# Patient Record
Sex: Female | Born: 1980
Health system: Southern US, Community
[De-identification: ages and names within clinical notes are randomized; demographics above are authoritative.]

## PROBLEM LIST (undated history)

## (undated) DIAGNOSIS — M1990 Unspecified osteoarthritis, unspecified site: Secondary | ICD-10-CM

## (undated) DIAGNOSIS — N201 Calculus of ureter: Secondary | ICD-10-CM

## (undated) DIAGNOSIS — G43909 Migraine, unspecified, not intractable, without status migrainosus: Secondary | ICD-10-CM

## (undated) DIAGNOSIS — G4730 Sleep apnea, unspecified: Secondary | ICD-10-CM

## (undated) DIAGNOSIS — D069 Carcinoma in situ of cervix, unspecified: Secondary | ICD-10-CM

## (undated) DIAGNOSIS — R112 Nausea with vomiting, unspecified: Secondary | ICD-10-CM

## (undated) DIAGNOSIS — F419 Anxiety disorder, unspecified: Secondary | ICD-10-CM

## (undated) DIAGNOSIS — E282 Polycystic ovarian syndrome: Secondary | ICD-10-CM

## (undated) DIAGNOSIS — J45909 Unspecified asthma, uncomplicated: Secondary | ICD-10-CM

## (undated) DIAGNOSIS — R300 Dysuria: Secondary | ICD-10-CM

## (undated) DIAGNOSIS — N200 Calculus of kidney: Secondary | ICD-10-CM

## (undated) DIAGNOSIS — R319 Hematuria, unspecified: Secondary | ICD-10-CM

## (undated) DIAGNOSIS — N3942 Incontinence without sensory awareness: Secondary | ICD-10-CM

## (undated) DIAGNOSIS — Z8489 Family history of other specified conditions: Secondary | ICD-10-CM

## (undated) DIAGNOSIS — K449 Diaphragmatic hernia without obstruction or gangrene: Secondary | ICD-10-CM

## (undated) DIAGNOSIS — K219 Gastro-esophageal reflux disease without esophagitis: Secondary | ICD-10-CM

## (undated) DIAGNOSIS — Z87442 Personal history of urinary calculi: Secondary | ICD-10-CM

## (undated) DIAGNOSIS — D649 Anemia, unspecified: Secondary | ICD-10-CM

## (undated) DIAGNOSIS — Z87410 Personal history of cervical dysplasia: Secondary | ICD-10-CM

## (undated) DIAGNOSIS — N189 Chronic kidney disease, unspecified: Secondary | ICD-10-CM

## (undated) DIAGNOSIS — T7840XA Allergy, unspecified, initial encounter: Secondary | ICD-10-CM

## (undated) DIAGNOSIS — Z9889 Other specified postprocedural states: Secondary | ICD-10-CM

## (undated) HISTORY — DX: Polycystic ovarian syndrome: E28.2

## (undated) HISTORY — PX: FINGER SURGERY: SHX640

## (undated) HISTORY — PX: CERVICAL BIOPSY  W/ LOOP ELECTRODE EXCISION: SUR135

## (undated) HISTORY — DX: Diaphragmatic hernia without obstruction or gangrene: K44.9

## (undated) HISTORY — DX: Carcinoma in situ of cervix, unspecified: D06.9

## (undated) HISTORY — PX: COMBINED HYSTEROSCOPY DIAGNOSTIC / D&C: SUR297

## (undated) HISTORY — DX: Allergy, unspecified, initial encounter: T78.40XA

## (undated) HISTORY — PX: FRACTURE SURGERY: SHX138

## (undated) HISTORY — DX: Migraine, unspecified, not intractable, without status migrainosus: G43.909

## (undated) HISTORY — DX: Chronic kidney disease, unspecified: N18.9

## (undated) HISTORY — PX: ABDOMINAL HYSTERECTOMY: SHX81

## (undated) HISTORY — PX: DILATION AND CURETTAGE OF UTERUS: SHX78

---
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
## 2001-06-14 ENCOUNTER — Other Ambulatory Visit: Admission: RE | Admit: 2001-06-14 | Discharge: 2001-06-14 | Payer: Self-pay | Admitting: Obstetrics and Gynecology

## 2002-06-26 ENCOUNTER — Other Ambulatory Visit: Admission: RE | Admit: 2002-06-26 | Discharge: 2002-06-26 | Payer: Self-pay | Admitting: Obstetrics and Gynecology

## 2003-07-15 ENCOUNTER — Other Ambulatory Visit: Admission: RE | Admit: 2003-07-15 | Discharge: 2003-07-15 | Payer: Self-pay | Admitting: Obstetrics and Gynecology

## 2004-01-26 DIAGNOSIS — C801 Malignant (primary) neoplasm, unspecified: Secondary | ICD-10-CM

## 2004-01-26 DIAGNOSIS — Z87410 Personal history of cervical dysplasia: Secondary | ICD-10-CM

## 2004-01-26 HISTORY — DX: Personal history of cervical dysplasia: Z87.410

## 2004-01-26 HISTORY — DX: Malignant (primary) neoplasm, unspecified: C80.1

## 2004-10-07 ENCOUNTER — Other Ambulatory Visit: Admission: RE | Admit: 2004-10-07 | Discharge: 2004-10-07 | Payer: Self-pay | Admitting: Obstetrics and Gynecology

## 2004-12-04 ENCOUNTER — Encounter (INDEPENDENT_AMBULATORY_CARE_PROVIDER_SITE_OTHER): Payer: Self-pay | Admitting: Specialist

## 2004-12-04 ENCOUNTER — Ambulatory Visit (HOSPITAL_COMMUNITY): Admission: RE | Admit: 2004-12-04 | Discharge: 2004-12-04 | Payer: Self-pay | Admitting: Obstetrics and Gynecology

## 2004-12-12 ENCOUNTER — Inpatient Hospital Stay (HOSPITAL_COMMUNITY): Admission: AD | Admit: 2004-12-12 | Discharge: 2004-12-12 | Payer: Self-pay | Admitting: Obstetrics & Gynecology

## 2005-03-17 ENCOUNTER — Other Ambulatory Visit: Admission: RE | Admit: 2005-03-17 | Discharge: 2005-03-17 | Payer: Self-pay | Admitting: Obstetrics and Gynecology

## 2005-08-29 ENCOUNTER — Emergency Department (HOSPITAL_COMMUNITY): Admission: EM | Admit: 2005-08-29 | Discharge: 2005-08-29 | Payer: Self-pay | Admitting: Emergency Medicine

## 2007-04-12 ENCOUNTER — Emergency Department: Payer: Self-pay | Admitting: Emergency Medicine

## 2008-01-26 HISTORY — PX: OTHER SURGICAL HISTORY: SHX169

## 2008-03-25 HISTORY — PX: COLONOSCOPY: SHX174

## 2008-09-03 ENCOUNTER — Ambulatory Visit: Payer: Self-pay | Admitting: Family Medicine

## 2008-09-17 ENCOUNTER — Ambulatory Visit: Payer: Self-pay | Admitting: Family Medicine

## 2008-11-11 ENCOUNTER — Ambulatory Visit: Payer: Self-pay | Admitting: Family Medicine

## 2009-04-11 ENCOUNTER — Emergency Department: Payer: Self-pay | Admitting: Emergency Medicine

## 2009-04-12 ENCOUNTER — Emergency Department: Payer: Self-pay | Admitting: Emergency Medicine

## 2009-11-24 ENCOUNTER — Encounter
Admission: RE | Admit: 2009-11-24 | Discharge: 2009-11-24 | Payer: Self-pay | Source: Home / Self Care | Attending: Obstetrics and Gynecology | Admitting: Obstetrics and Gynecology

## 2010-02-16 ENCOUNTER — Encounter
Admission: RE | Admit: 2010-02-16 | Discharge: 2010-02-24 | Payer: Self-pay | Source: Home / Self Care | Attending: Obstetrics and Gynecology | Admitting: Obstetrics and Gynecology

## 2010-05-21 ENCOUNTER — Ambulatory Visit (INDEPENDENT_AMBULATORY_CARE_PROVIDER_SITE_OTHER): Payer: 59 | Admitting: Family Medicine

## 2010-05-21 DIAGNOSIS — J069 Acute upper respiratory infection, unspecified: Secondary | ICD-10-CM

## 2010-05-21 DIAGNOSIS — J309 Allergic rhinitis, unspecified: Secondary | ICD-10-CM

## 2010-06-01 ENCOUNTER — Ambulatory Visit (INDEPENDENT_AMBULATORY_CARE_PROVIDER_SITE_OTHER): Payer: 59 | Admitting: Family Medicine

## 2010-06-01 DIAGNOSIS — R05 Cough: Secondary | ICD-10-CM

## 2010-06-01 DIAGNOSIS — R059 Cough, unspecified: Secondary | ICD-10-CM

## 2010-06-05 ENCOUNTER — Telehealth: Payer: Self-pay | Admitting: Family Medicine

## 2010-06-05 DIAGNOSIS — IMO0001 Reserved for inherently not codable concepts without codable children: Secondary | ICD-10-CM

## 2010-06-05 NOTE — Telephone Encounter (Signed)
PT IRRITATED THAT SHE HAS BEEN SICK FOR OVER A MONTH AND MEDS NOT HELPING. JUST FINISHED [facility]PAK TODAY AND STILL COUGHING A LOT. CHEST STILL FEELS "LIKE IT IS FULL OF FLUID" , "FEELS LIKE JELLO WHEN SHE BREATHE". KNAPP MENTIONED MAYBE AN XRAY IF THIS MED DOES NOT HELP. IS THERE SOMETHING SHE CAN DO TO HELP SYMPTOMS FOR NOW? WHAT DO YO SUGGEST WE DO NOW?    --CAN BE REACHED AT WORK # [phone] UNTIL 5:00 THEN AT [phone] AFTER 5:00*--

## 2010-06-12 NOTE — Op Note (Signed)
[name], [name]                 ACCOUNT NO.:  [id number]   MEDICAL RECORD NO.:  [id number]          PATIENT TYPE:  AMB   LOCATION:  SDC                           FACILITY:  [facility]   PHYSICIAN:  Michelle L. Grewal, M.D.DATE OF BIRTH:  Oct 16, 1980   DATE OF PROCEDURE:  12/04/2004  DATE OF DISCHARGE:                                 OPERATIVE REPORT   PREOPERATIVE DIAGNOSIS:  Cervical intraepithelial neoplasia I and II.   POSTOPERATIVE DIAGNOSIS:  Cervical intraepithelial neoplasia I and II.   PROCEDURE:  Loop electrosurgical excision procedure cone biopsy of the  cervix.   SURGEON:  Michelle L. Vincente Poli, M.D.   ANESTHESIA:  MAC with local.   ESTIMATED BLOOD LOSS:  Minimal.   COMPLICATIONS:  None.   PROCEDURE:  The patient was taken to the operating room.  She was given  sedation and placed in the lithotomy position.  An involuted speculum was  placed in the vagina.  The cervix was grasped with a tenaculum and a  paracervical block was then performed.  Lugol's solution was applied and a  LEEP cone biopsy was performed in standard fashion an exocervical specimen  and an endocervical specimen,  Rollerball is applied for hemostasis.  I then  applied some Gelfoam and Monsel's solution for further hemostasis.  Hemostasis was excellent.  All instruments were removed from the vagina.  All sponge, lap and instrument counts were correct x2.  The patient went to  the recovery room in stable condition.      Michelle L. Vincente Poli, M.D.  Electronically Signed     MLG/MEDQ  D:  12/04/2004  T:  12/05/2004  Job:  [id number]

## 2011-03-17 ENCOUNTER — Ambulatory Visit (INDEPENDENT_AMBULATORY_CARE_PROVIDER_SITE_OTHER): Payer: 59 | Admitting: Family Medicine

## 2011-03-17 ENCOUNTER — Encounter: Payer: Self-pay | Admitting: Family Medicine

## 2011-03-17 VITALS — BP 120/70 | HR 94 | Ht 62.0 in | Wt 180.0 lb

## 2011-03-17 DIAGNOSIS — F988 Other specified behavioral and emotional disorders with onset usually occurring in childhood and adolescence: Secondary | ICD-10-CM

## 2011-03-17 MED ORDER — METHYLPHENIDATE HCL 10 MG PO TABS: 10.0000 mg | ORAL_TABLET | Freq: Two times a day (BID) | ORAL | Status: DC

## 2011-03-17 NOTE — Patient Instructions (Signed)
Let me know if it works, how long does work and do you have any difficulty with this.

## 2011-03-17 NOTE — Progress Notes (Signed)
  Subjective:    Patient ID: Kayla Sexton, female    DOB: [birth year], 31 y.o.   MRN: [id number]  HPI He is here for consultation concerning ADD. She was apparently diagnosed with this as a child and used Ritalin while in school. She is now employed and is having difficulty with staying on task, being easily distracted, cannot focus. This is interfering with her ability to work. She is apparently getting in trouble with her employer. She would like to get placed back on her Ritalin.   Review of Systems     Objective:   Physical Exam Alert and in no distress. ADD testing was 47      Assessment & Plan:  ADD. I will give her Ritalin. I discussed possible side effects with her, duration of action and efficacy. She will let me know how this works.

## 2011-03-19 ENCOUNTER — Telehealth: Payer: Self-pay | Admitting: Family Medicine

## 2011-03-19 NOTE — Telephone Encounter (Signed)
Called patient and left her a message informing her that Dr.Lalonde is recommending that she take 2 per day, there is a longer acting alternative but it will cost more. Instructed patient to call back if any further questions.

## 2011-03-19 NOTE — Telephone Encounter (Signed)
Have her take two a day. Let her know that we can give a longer acting med but it will cost more

## 2011-03-19 NOTE — Telephone Encounter (Signed)
Pt called and states Ritalin is working well other than it is only lasting 4 hours.  Should she take another after the 4 hours?  Please advise [phone]

## 2011-03-24 ENCOUNTER — Telehealth: Payer: Self-pay | Admitting: Family Medicine

## 2011-03-24 DIAGNOSIS — F988 Other specified behavioral and emotional disorders with onset usually occurring in childhood and adolescence: Secondary | ICD-10-CM

## 2011-03-24 MED ORDER — METHYLPHENIDATE HCL 10 MG PO TABS: 10.0000 mg | ORAL_TABLET | Freq: Two times a day (BID) | ORAL | Status: DC

## 2011-03-24 NOTE — Telephone Encounter (Signed)
Ritalin 10 mg, 60 pills written.

## 2011-03-24 NOTE — Telephone Encounter (Signed)
Called patient and she will be here 03/26/11 @ 3:30pm per [employer]

## 2011-03-26 ENCOUNTER — Institutional Professional Consult (permissible substitution): Payer: 59 | Admitting: Family Medicine

## 2011-03-29 ENCOUNTER — Ambulatory Visit (INDEPENDENT_AMBULATORY_CARE_PROVIDER_SITE_OTHER): Payer: 59 | Admitting: Family Medicine

## 2011-03-29 DIAGNOSIS — F802 Mixed receptive-expressive language disorder: Secondary | ICD-10-CM

## 2011-03-29 DIAGNOSIS — H9325 Central auditory processing disorder: Secondary | ICD-10-CM

## 2011-03-29 DIAGNOSIS — F988 Other specified behavioral and emotional disorders with onset usually occurring in childhood and adolescence: Secondary | ICD-10-CM

## 2011-03-29 NOTE — Progress Notes (Signed)
  Subjective:    Patient ID: Kayla Sexton, female    DOB: [birth year], 31 y.o.   MRN: [id number]  HPI She is here for consultation. She states that the Ritalin is working to help with focus and concentration and last approximately 4 hours. This is causing some difficulty during the middle of the day. She also has a previous history of a central auditory processing disorder which is causing some difficulty with her supervisor in regard to giving verbal orders and her not acting upon them.   Review of Systems     Objective:   Physical Exam Alert and in no distress otherwise not examined       Assessment & Plan:  ADD. Central auditory processing disorder. After discussion with her, I will place her on 27 mg of Concerta to help get her through the whole day. I also filled out paperwork explaining the fact that at giving written instructions written and verbal would be much better for wait for her to get her job done.

## 2011-09-01 ENCOUNTER — Telehealth: Payer: Self-pay | Admitting: Internal Medicine

## 2011-09-02 MED ORDER — METHYLPHENIDATE HCL ER (OSM) 27 MG PO TBCR: 27.0000 mg | EXTENDED_RELEASE_TABLET | ORAL | Status: DC

## 2011-09-02 NOTE — Telephone Encounter (Signed)
Concerta 27 mg renewed

## 2011-09-02 NOTE — Telephone Encounter (Signed)
Pt is taking concerta 20mg . Not ritalin. Pt would like Othal Kubitz(me) to take it to her when its ready

## 2011-09-24 ENCOUNTER — Ambulatory Visit: Payer: 59 | Admitting: Medical

## 2011-09-25 ENCOUNTER — Emergency Department (HOSPITAL_COMMUNITY)
Admission: EM | Admit: 2011-09-25 | Discharge: 2011-09-26 | Disposition: A | Discharge status: Home / Self Care | Payer: 59 | Attending: Emergency Medicine | Admitting: Emergency Medicine

## 2011-09-25 ENCOUNTER — Encounter (HOSPITAL_COMMUNITY): Payer: Self-pay | Admitting: Emergency Medicine

## 2011-09-25 DIAGNOSIS — E119 Type 2 diabetes mellitus without complications: Secondary | ICD-10-CM | POA: Insufficient documentation

## 2011-09-25 DIAGNOSIS — L03211 Cellulitis of face: Secondary | ICD-10-CM | POA: Insufficient documentation

## 2011-09-25 DIAGNOSIS — L0201 Cutaneous abscess of face: Secondary | ICD-10-CM | POA: Insufficient documentation

## 2011-09-25 DIAGNOSIS — L0291 Cutaneous abscess, unspecified: Secondary | ICD-10-CM

## 2011-09-25 NOTE — ED Notes (Signed)
Pt alert, c.o infection to forehead, onset was several days ago, resp even unlabored, skin pwd, 2cm raised redden area, non open non draining

## 2011-09-26 ENCOUNTER — Emergency Department (HOSPITAL_COMMUNITY): Payer: 59

## 2011-09-26 ENCOUNTER — Emergency Department (HOSPITAL_COMMUNITY)
Admission: EM | Admit: 2011-09-26 | Discharge: 2011-09-26 | Disposition: A | Discharge status: Home / Self Care | Payer: 59 | Attending: Emergency Medicine | Admitting: Emergency Medicine

## 2011-09-26 ENCOUNTER — Encounter (HOSPITAL_COMMUNITY): Payer: Self-pay | Admitting: *Deleted

## 2011-09-26 DIAGNOSIS — R51 Headache: Secondary | ICD-10-CM | POA: Insufficient documentation

## 2011-09-26 DIAGNOSIS — L0390 Cellulitis, unspecified: Secondary | ICD-10-CM

## 2011-09-26 DIAGNOSIS — L0291 Cutaneous abscess, unspecified: Secondary | ICD-10-CM | POA: Insufficient documentation

## 2011-09-26 DIAGNOSIS — H5789 Other specified disorders of eye and adnexa: Secondary | ICD-10-CM | POA: Insufficient documentation

## 2011-09-26 DIAGNOSIS — H532 Diplopia: Secondary | ICD-10-CM | POA: Insufficient documentation

## 2011-09-26 LAB — POCT I-STAT, CHEM 8
BUN: 12 mg/dL (ref 6–23)
Chloride: 104 mEq/L (ref 96–112)
Creatinine, Ser: 0.8 mg/dL (ref 0.50–1.10)
Glucose, Bld: 86 mg/dL (ref 70–99)
HCT: 42 % (ref 36.0–46.0)
Potassium: 3.6 mEq/L (ref 3.5–5.1)

## 2011-09-26 LAB — CBC WITH DIFFERENTIAL/PLATELET
Hemoglobin: 14.2 g/dL (ref 12.0–15.0)
Lymphocytes Relative: 41 % (ref 12–46)
Lymphs Abs: 3.9 10*3/uL (ref 0.7–4.0)
Monocytes Relative: 10 % (ref 3–12)
Neutro Abs: 4.4 10*3/uL (ref 1.7–7.7)
Neutrophils Relative %: 47 % (ref 43–77)
RBC: 4.89 MIL/uL (ref 3.87–5.11)

## 2011-09-26 LAB — GLUCOSE, CAPILLARY

## 2011-09-26 MED ORDER — VANCOMYCIN HCL IN DEXTROSE 1-5 GM/200ML-% IV SOLN
1000.0000 mg | Freq: Once | INTRAVENOUS | Status: AC
  Administered 2011-09-26: 1000 mg via INTRAVENOUS
  Filled 2011-09-26: qty 200

## 2011-09-26 MED ORDER — SULFAMETHOXAZOLE-TRIMETHOPRIM 800-160 MG PO TABS: 2.0000 | ORAL_TABLET | Freq: Two times a day (BID) | ORAL | Status: AC

## 2011-09-26 MED ORDER — ONDANSETRON HCL 4 MG/2ML IJ SOLN
4.0000 mg | Freq: Once | INTRAMUSCULAR | Status: AC
  Administered 2011-09-26: 4 mg via INTRAVENOUS
  Filled 2011-09-26: qty 2

## 2011-09-26 MED ORDER — HYDROCODONE-ACETAMINOPHEN 5-325 MG PO TABS: 1.0000 | ORAL_TABLET | Freq: Four times a day (QID) | ORAL | Status: AC | PRN

## 2011-09-26 MED ORDER — IOHEXOL 300 MG/ML  SOLN
100.0000 mL | Freq: Once | INTRAMUSCULAR | Status: AC | PRN
  Administered 2011-09-26: 100 mL via INTRAVENOUS

## 2011-09-26 MED ORDER — MORPHINE SULFATE 4 MG/ML IJ SOLN
4.0000 mg | Freq: Once | INTRAMUSCULAR | Status: AC
  Administered 2011-09-26: 4 mg via INTRAVENOUS
  Filled 2011-09-26: qty 1

## 2011-09-26 MED ORDER — SULFAMETHOXAZOLE-TMP DS 800-160 MG PO TABS
1.0000 | ORAL_TABLET | Freq: Once | ORAL | Status: AC
  Administered 2011-09-26: 1 via ORAL
  Filled 2011-09-26: qty 1

## 2011-09-26 MED ORDER — SULFAMETHOXAZOLE-TRIMETHOPRIM 800-160 MG PO TABS: 2.0000 | ORAL_TABLET | Freq: Two times a day (BID) | ORAL | Status: DC

## 2011-09-26 MED ORDER — LORAZEPAM 1 MG PO TABS
1.0000 mg | ORAL_TABLET | Freq: Once | ORAL | Status: AC
  Administered 2011-09-26: 1 mg via ORAL
  Filled 2011-09-26: qty 1

## 2011-09-26 MED ORDER — DIPHENHYDRAMINE HCL 50 MG/ML IJ SOLN
25.0000 mg | Freq: Once | INTRAMUSCULAR | Status: AC
  Administered 2011-09-26: 25 mg via INTRAVENOUS
  Filled 2011-09-26: qty 1

## 2011-09-26 MED ORDER — HYDROCODONE-ACETAMINOPHEN 5-325 MG PO TABS
1.0000 | ORAL_TABLET | Freq: Once | ORAL | Status: AC
  Administered 2011-09-26: 1 via ORAL
  Filled 2011-09-26: qty 1

## 2011-09-26 NOTE — ED Notes (Signed)
Unable to complete visual acuity at this time due to RN and [facility] at bedside

## 2011-09-26 NOTE — ED Notes (Signed)
Patient complaining of severe itching approximately  40 minutes after  vancomycin infusion and headache is worse.  Patient denies any known allergies to vancomycin. Will inform MD.

## 2011-09-26 NOTE — ED Provider Notes (Signed)
History     CSN: [id number]  Arrival date & time 09/26/11  1226   First MD Initiated Contact with Patient 09/26/11 1428      Chief Complaint  Patient presents with  . Headache  . Eye Pain    (Consider location/radiation/quality/duration/timing/severity/associated sxs/prior treatment) HPI Comments: Patient was seen here for a forehead abscess last night.  An I and D was performed and the patient was discharged.  She was prescribed antibiotics, however she has not started them as it was 3 AM when she was discharged.  She returns today complaining of more swelling and "double vision".  She reports her head hurts.  Patient is a 31 y.o. female presenting with headaches and eye pain. The history is provided by the patient.  Headache  This is a new problem. The current episode started 2 days ago. The problem occurs constantly. The problem has been gradually worsening. The headache is associated with nothing. The quality of the pain is described as throbbing. The pain is moderate.  Eye Pain Associated symptoms include headaches.    Past Medical History  Diagnosis Date  . CIN III (cervical intraepithelial neoplasia III)   . HH (hiatus hernia)   . Migraines   . DM (diabetes mellitus) 10/2009    Past Surgical History  Procedure Date  . Colonoscopy 03/2008    Dr. Bosie Clos  . Endoscopy 2010    Dr. Madilyn Fireman    No family history on file.  History  Substance Use Topics  . Smoking status: Never Smoker   . Smokeless tobacco: Never Used  . Alcohol Use: No    OB History    Grav Para Term Preterm Abortions TAB SAB Ect Mult Living                  Review of Systems  Eyes: Positive for pain.  Neurological: Positive for headaches.  All other systems reviewed and are negative.    Allergies  Adhesive  Home Medications   Current Outpatient Rx  Name Route Sig Dispense Refill  . HYDROCODONE-ACETAMINOPHEN 5-325 MG PO TABS Oral Take 1 tablet by mouth every 6 (six) hours as needed  for pain. 15 tablet 0  . METHYLPHENIDATE HCL ER 27 MG PO TBCR Oral Take 1 tablet (27 mg total) by mouth every morning. 30 tablet 0  . SULFAMETHOXAZOLE-TRIMETHOPRIM 800-160 MG PO TABS Oral Take 2 tablets by mouth 2 (two) times daily. 24 tablet 0    BP 113/83  Pulse 86  Temp 98.7 F (37.1 C) (Oral)  Resp 17  SpO2 96%  LMP 07/29/2011  Physical Exam  Nursing note and vitals reviewed. Constitutional: She is oriented to person, place, and time. She appears well-developed and well-nourished.  HENT:       There is an area of swelling, redness, ttp to the right forehead.  I am able to express a small amount of pus.  The redness has not exceeded the line drawn last night.  Eyes: EOM are normal. Pupils are equal, round, and reactive to light.       The patient reports double vision that does not correct with covering her left eye.    Neck: Normal range of motion.  Neurological: She is alert and oriented to person, place, and time. No cranial nerve deficit. Coordination normal.  Skin: Skin is warm and dry.    ED Course  Procedures (including critical care time)  Labs Reviewed - No data to display No results found.   No diagnosis  found.    MDM  The patient presents here for follow up of forehead abscess.  She returns to be evaluated as the swelling is extending into her lower forehead and eye.  She is also having some double vision as well which is monocular and does not correct when covering the left eye.  I am uncertain of the etiology of the double vision, however a maxillofacial ct will be performed to rule out the possibility of extension into the orbit.  She was also given a dose of vancomycin which caused a rash and was given benadryl.    At this point, the ct is pending and the care will be signed out to Dr. Freida Busman who will obtain these results and determine the final disposition.  Radiology is awaiting an istat before the ct scan due to the patient's history of "pre-diabetes".               Geoffery Lyons, MD 09/26/11 [phone]

## 2011-09-26 NOTE — ED Provider Notes (Signed)
History     CSN: [id number]  Arrival date & time 09/25/11  1943   First MD Initiated Contact with Patient 09/25/11 2357      Chief Complaint  Patient presents with  . Wound Check    (Consider location/radiation/quality/duration/timing/severity/associated sxs/prior treatment) Patient is a 31 y.o. female presenting with abscess. The history is provided by the patient.  Abscess  This is a new problem. The current episode started yesterday. The problem has been gradually worsening. The abscess is present on the head. The problem is mild. The abscess is characterized by redness and painfulness. Pertinent negatives include no anorexia, no decrease in physical activity, not sleeping less, not drinking less, no fever, no fussiness, not sleeping more, no diarrhea, no vomiting, no congestion, no rhinorrhea, no sore throat, no decreased responsiveness and no cough. There were no sick contacts.    Past Medical History  Diagnosis Date  . CIN III (cervical intraepithelial neoplasia III)   . HH (hiatus hernia)   . Migraines   . DM (diabetes mellitus) 10/2009    Past Surgical History  Procedure Date  . Colonoscopy 03/2008    Dr. Bosie Clos  . Endoscopy 2010    Dr. Madilyn Fireman    No family history on file.  History  Substance Use Topics  . Smoking status: Never Smoker   . Smokeless tobacco: Never Used  . Alcohol Use: Not on file    OB History    Grav Para Term Preterm Abortions TAB SAB Ect Mult Living                  Review of Systems  Constitutional: Negative for fever, chills, diaphoresis, activity change and decreased responsiveness.       Denies night sweats  HENT: Negative for congestion, sore throat, rhinorrhea and neck stiffness.   Eyes: Negative for visual disturbance.  Respiratory: Negative for cough and shortness of breath.   Cardiovascular: Negative for chest pain.  Gastrointestinal: Negative for vomiting, abdominal pain, diarrhea and anorexia.  Genitourinary: Negative  for dysuria, urgency and frequency.  Musculoskeletal: Negative for gait problem.  Skin: Negative for color change and rash.  Neurological: Positive for headaches. Negative for dizziness and light-headedness.  Hematological: Negative for adenopathy.  All other systems reviewed and are negative.    Allergies  Adhesive  Home Medications   Current Outpatient Rx  Name Route Sig Dispense Refill  . METHYLPHENIDATE HCL ER 27 MG PO TBCR Oral Take 1 tablet (27 mg total) by mouth every morning. 30 tablet 0    BP 139/90  Pulse 90  Temp 98.5 F (36.9 C) (Oral)  Resp 16  SpO2 99%  Physical Exam  Nursing note and vitals reviewed. Constitutional: She is oriented to person, place, and time. She appears well-developed and well-nourished. She does not have a sickly appearance. She does not appear ill. No distress.  HENT:  Head: Normocephalic.         See skin exam.  Non tender superior orbits and nasal bridge  Eyes: Conjunctivae and EOM are normal.       painfree EOMs   Neck: Normal range of motion. Neck supple.  Cardiovascular: Normal rate and regular rhythm.   Pulmonary/Chest: Effort normal and breath sounds normal.  Musculoskeletal: Normal range of motion. She exhibits no edema.  Lymphadenopathy:       Head (right side): No submental, no preauricular and no posterior auricular adenopathy present.       Head (left side): No submental, no submandibular, no  preauricular and no posterior auricular adenopathy present.    She has no axillary adenopathy.  Neurological: She is alert and oriented to person, place, and time.  Skin: Skin is warm and dry. No rash noted. She is not diaphoretic.       1.5 cm sized abscess located on forehead just superior to right eyebrow. Extreme tenderness to palpation. Not currently draining. Abscess is fluctuant without warmth, surrounding erythema, or induration.  Psychiatric: She has a normal mood and affect. Her behavior is normal.    ED Course    Procedures (including critical care time)  Labs Reviewed - No data to display No results found. INCISION AND DRAINAGE Performed by: Jaci Carrel Consent: Verbal consent obtained. Risks and benefits: risks, benefits and alternatives were discussed Type: abscess  Body area: forehead  Anesthesia: local infiltration  Local anesthetic: lidocaine 2% w epinephrine  Anesthetic total: 1 ml  Complexity: complex Blunt dissection to break up loculations  Drainage: purulent  Drainage amount: minimal to moderate  Packing material: none  Patient tolerance: Patient tolerated the procedure well with no immediate complications.     No diagnosis found.    MDM  Abscess  Pt to ER with abscess superior to left eyebrow w associated HA. I&D performed without complications, size not amenable for packing. Pt afebrile without any vision changes, pain with EOMs, vomiting, proptosis, or ttp of orbits. Strict return precautions discussed w pt and family if any of these s/s develop to return to ER for CT of max/facial for r/o of infection spread. Pt verbalizes understanding and appears reliable for follow up/return if necessary. Pt states she is a borderline diabetic. Will dc with antibiotics.         Jaci Carrel, [place] 09/26/11 [phone]

## 2011-09-26 NOTE — ED Provider Notes (Signed)
Patient Kayla Sexton by Dr. Judd Lien. CT results reviewed with patient and she is stable for discharge.  Toy Baker, MD 09/26/11 [phone]

## 2011-09-26 NOTE — ED Provider Notes (Signed)
Medical screening examination/treatment/procedure(s) were performed by non-physician practitioner and as supervising physician I was immediately available for consultation/collaboration.  Gerhard Munch, MD 09/26/11 [phone]

## 2011-09-26 NOTE — ED Notes (Signed)
Pt has an I&D of an abscess on her right forehead yesterday.  Pt now presents with increased swelling to area and right upper eye lid.  Pt states her headache is now worse since yesterday.  Pt has area of redness where abscess was drained and swelling of right upper eye lid and bridge of her nose.  Pt denies N/V.  Pt c/o dizziness and double vision in right eye.

## 2011-09-30 ENCOUNTER — Ambulatory Visit (INDEPENDENT_AMBULATORY_CARE_PROVIDER_SITE_OTHER): Payer: 59 | Admitting: Family Medicine

## 2011-09-30 ENCOUNTER — Encounter: Payer: Self-pay | Admitting: Family Medicine

## 2011-09-30 VITALS — BP 120/74 | HR 68 | Wt 183.0 lb

## 2011-09-30 DIAGNOSIS — L0390 Cellulitis, unspecified: Secondary | ICD-10-CM

## 2011-09-30 DIAGNOSIS — L0291 Cutaneous abscess, unspecified: Secondary | ICD-10-CM

## 2011-09-30 NOTE — Progress Notes (Signed)
  Subjective:    Patient ID: Kayla Sexton, female    DOB: Feb 21, 1980, 31 y.o.   MRN: [id number]  HPI She is here for a followup visit concerning 2 trips to the emergency room for evaluation and treatment of a 4 had abscess. She was treated appropriately in the emergency room and sent home on Septra. Apparently a culture was not taken. She states that the wound is slowly healing.   Review of Systems     Objective:   Physical Exam Alert and in no distress. She does have a healing lesion that is slightly erythematous and approximately 2 cm in size just to the right of midline on her for head.       Assessment & Plan:   1. Abscess    she will finish the course of the antibiotic. I discussed the possibility of this being MRSA and did recommend using Dial soap or Aleve or 2000. Also discussed the possibility of this occurring in other areas on her body and to call me if it does.

## 2011-09-30 NOTE — Patient Instructions (Signed)
Be vigilant in terms of this occurring elsewhere on your body and I would recommend either Dial soap or Lever 2000

## 2012-01-31 ENCOUNTER — Ambulatory Visit (INDEPENDENT_AMBULATORY_CARE_PROVIDER_SITE_OTHER): Payer: 59 | Admitting: Family Medicine

## 2012-01-31 ENCOUNTER — Encounter: Payer: Self-pay | Admitting: Family Medicine

## 2012-01-31 VITALS — BP 120/80 | HR 70 | Temp 98.3°F | Wt 186.0 lb

## 2012-01-31 DIAGNOSIS — H6693 Otitis media, unspecified, bilateral: Secondary | ICD-10-CM

## 2012-01-31 DIAGNOSIS — H6690 Otitis media, unspecified, unspecified ear: Secondary | ICD-10-CM

## 2012-01-31 MED ORDER — AMOXICILLIN 875 MG PO TABS: 875.0000 mg | ORAL_TABLET | Freq: Two times a day (BID) | ORAL | Status: DC

## 2012-01-31 NOTE — Progress Notes (Signed)
  Subjective:    Patient ID: Kayla Sexton, female    DOB: 06/07/1980, 32 y.o.   MRN: [id number]  HPI  She woke up Christmas morning with nasal congestion,  dry cough; no sore throat, earache, fever or chills. Several days later she noted more PND as well as right ear ache. She does not smoke and has no allergies. She also notes increased difficulty with postnasal drainage especially after eating.   Review of Systems     Objective:   Physical Exam alert and in no distress. Tympanic membranes bilaterally are dull and slightly red, canals are normal. Throat is clear. Tonsils are normal. Neck is supple without adenopathy or thyromegaly. Cardiac exam shows a regular sinus rhythm without murmurs or gallops. Lungs are clear to auscultation.        Assessment & Plan:   1. 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 Recommend possibly trying antihistamine for this.

## 2012-01-31 NOTE — Patient Instructions (Addendum)
You probably have gustatory rhinitis. Take all the antibiotic and if you're still having difficulty then try antihistamines can

## 2012-02-07 ENCOUNTER — Telehealth: Payer: Self-pay | Admitting: Internal Medicine

## 2012-02-07 MED ORDER — AMOXICILLIN-POT CLAVULANATE 875-125 MG PO TABS: 1.0000 | ORAL_TABLET | Freq: Two times a day (BID) | ORAL | Status: AC

## 2012-02-07 NOTE — Telephone Encounter (Signed)
Let her know that I called in Augmentin which is stronger than just plain Amoxil.

## 2012-02-07 NOTE — Telephone Encounter (Signed)
Augmentin called in. She is actually getting worse.

## 2012-02-07 NOTE — Telephone Encounter (Signed)
Pt called stating that the amoxicillin that she was given last week has not helped any. Pt is worst than before and now is weak and having SOB and can not walk far without getting out of breath. Cough is still there and still having pain in both ear and still can not hear out of her ear and she would like something else. Pt pharmacy is [facility]

## 2012-02-07 NOTE — Telephone Encounter (Signed)
Left message med was called in

## 2012-02-28 ENCOUNTER — Other Ambulatory Visit: Payer: Self-pay | Admitting: Obstetrics and Gynecology

## 2012-10-26 ENCOUNTER — Ambulatory Visit (INDEPENDENT_AMBULATORY_CARE_PROVIDER_SITE_OTHER): Payer: 59 | Admitting: Family Medicine

## 2012-10-26 ENCOUNTER — Encounter: Payer: Self-pay | Admitting: Family Medicine

## 2012-10-26 DIAGNOSIS — Z23 Encounter for immunization: Secondary | ICD-10-CM

## 2012-10-26 DIAGNOSIS — L989 Disorder of the skin and subcutaneous tissue, unspecified: Secondary | ICD-10-CM

## 2012-10-26 NOTE — Progress Notes (Signed)
  Subjective:    Patient ID: Kayla Sexton, female    DOB: Dec 16, 1980, 32 y.o.   MRN: [id number]  HPI She has a three-day history of left breast lesions. She has had 3 episodes of this within the last year. The lesions appear and only become slightly painful right before they burst.  Neer usually mainly on the left breast. She has no lesions anywhere else on her body.   Review of Systems     Objective:   Physical Exam 2 lesions are present on the left breast that are superficial and slightly erythematous. There is one on her mid chest area that almost looks like a cigarette burn. It is slightly erythematous around the edges with central yellowish crusting       Assessment & Plan:  Skin lesions, generalized  Need for prophylactic vaccination and inoculation against influenza - Plan: Flu Vaccine QUAD 36+ mos IM  flu shot given with risks and benefits discussed. It is bothersome that the lesions always occur on the left breast area however they do not look herpetic. Encouraged her to come in right prior to the lesions bursting so I can get a culture.

## 2012-10-26 NOTE — Patient Instructions (Signed)
Put some padding on the area and you can use some Polysporin. If you develop these lesions again, come right before they break so I can get a culture

## 2012-10-27 ENCOUNTER — Ambulatory Visit (INDEPENDENT_AMBULATORY_CARE_PROVIDER_SITE_OTHER): Payer: 59 | Admitting: Family Medicine

## 2012-10-27 ENCOUNTER — Telehealth: Payer: Self-pay | Admitting: Family Medicine

## 2012-10-27 DIAGNOSIS — L0390 Cellulitis, unspecified: Secondary | ICD-10-CM

## 2012-10-27 DIAGNOSIS — L0291 Cutaneous abscess, unspecified: Secondary | ICD-10-CM

## 2012-10-27 MED ORDER — DOXYCYCLINE HYCLATE 100 MG PO TABS: 100.0000 mg | ORAL_TABLET | Freq: Two times a day (BID) | ORAL | Status: DC

## 2012-10-27 NOTE — Patient Instructions (Signed)
Use Hibiclens daily for the next several days as well as the antibiotic.

## 2012-10-27 NOTE — Progress Notes (Signed)
  Subjective:    Patient ID: Kayla Sexton, female    DOB: 1980-05-13, 32 y.o.   MRN: [id number]  HPI She is here for reevaluation. She noted earlier today's slight burning sensation in the left breast area and did note some slight drainage from some new lesions.   Review of Systems     Objective:   Physical Exam Exam of the left breast does show several new slightly erythematous lesions with a minimal amount of drainage.        Assessment & Plan:  Cellulitis - Plan: doxycycline (VIBRA-TABS) 100 MG tablet, Culture, routine-abscess  it does seem to be very superficial but there was a slight amount of drainage. Recommend she use Hibiclens and no place her on doxycycline.

## 2012-10-27 NOTE — Telephone Encounter (Signed)
Pt called to update the status of her visit from yesterday's apt on 10/26/12.  She states her bumps are getting worse, and starting to spread and noticed a few more, and on one of them there is a white center but is not able to pop it, and she is at an uncomfortable state, and would like to know if there is something that can be called into [facility] in [place] off [address] to control it.  She does not want to come back in b/c she cannot afford to take more time off from work.  She was very upset, and felt like nothing was done for her yesterday, and does not know what to do. Please call pt at [phone]

## 2012-10-30 ENCOUNTER — Telehealth: Payer: Self-pay | Admitting: Family Medicine

## 2012-10-30 NOTE — Telephone Encounter (Signed)
Pt called again upset.  She said she is not responding to the medication that was given to her for her bumps, and it not improving at all.  She's still continuing to break out and get worse, and is insisting to know what she should or can do. She's requesting a call back at [phone].

## 2012-10-31 LAB — CULTURE, ROUTINE-ABSCESS
Gram Stain: NONE SEEN
Organism ID, Bacteria: NO GROWTH

## 2012-11-06 NOTE — Telephone Encounter (Signed)
Did you call on this or do I need to?

## 2013-01-25 HISTORY — PX: FOOT SURGERY: SHX648

## 2013-04-12 ENCOUNTER — Ambulatory Visit (INDEPENDENT_AMBULATORY_CARE_PROVIDER_SITE_OTHER): Payer: 59 | Admitting: Family Medicine

## 2013-04-12 ENCOUNTER — Encounter: Payer: Self-pay | Admitting: Family Medicine

## 2013-04-12 VITALS — BP 100/70 | HR 68 | Temp 97.6°F | Wt 188.0 lb

## 2013-04-12 DIAGNOSIS — J069 Acute upper respiratory infection, unspecified: Secondary | ICD-10-CM

## 2013-04-12 MED ORDER — ALBUTEROL SULFATE HFA 108 (90 BASE) MCG/ACT IN AERS: 2.0000 | INHALATION_SPRAY | Freq: Four times a day (QID) | RESPIRATORY_TRACT | Status: DC | PRN

## 2013-04-12 NOTE — Patient Instructions (Signed)
Use the Proventil as needed. If you've get worse over the weekend or make no improvement, give me a call Monday

## 2013-04-12 NOTE — Progress Notes (Signed)
   Subjective:    Patient ID: Kayla Sexton, female    DOB: 04/20/1980, 33 y.o.   MRN: [id number]  HPI She has a four day history of sinus congestion, cough, PND,wheezing. No sore throat, fever, chills or earache. Today she has more fatigue and malaise. She apparently did have difficulty with asthma as a child and has used albuterol in the past.   Review of Systems     Objective:   Physical Exam alert and in no distress. Tympanic membranes and canals are normal. Throat is clear. Tonsils are normal. Neck is supple without adenopathy or thyromegaly. Cardiac exam shows a regular sinus rhythm without murmurs or gallops. Lungs show scattered rhonchi.        Assessment & Plan:  Acute URI - Plan: albuterol (PROVENTIL HFA) 108 (90 BASE) MCG/ACT inhaler  recommend continued supportive care. If her symptoms worsen over the weekend or don't improve by the first week, she is supposed to call me.

## 2013-04-17 ENCOUNTER — Telehealth: Payer: Self-pay | Admitting: Internal Medicine

## 2013-04-17 MED ORDER — AMOXICILLIN 875 MG PO TABS: 875.0000 mg | ORAL_TABLET | Freq: Two times a day (BID) | ORAL | Status: DC

## 2013-04-17 NOTE — Telephone Encounter (Signed)
Pt.notified

## 2013-04-17 NOTE — Telephone Encounter (Signed)
Pt is still not feeling well, pt feels like weights on chest, chest still rattling, inhaler is not working and having SOB and can't seem to catch her breath. She is still coughing and tired.

## 2013-04-17 NOTE — Telephone Encounter (Signed)
Let her know that I called an antibiotic in 

## 2013-08-14 ENCOUNTER — Telehealth: Payer: Self-pay | Admitting: Family Medicine

## 2013-08-14 NOTE — Telephone Encounter (Signed)
Pt called and thinks she has Gluten Intollerance.  Dr. Tressia Danas has diagnosed her with PCOS and she states gluten intollerance goes hand in hand.  She has been testing herself on her foods and when she eats pasta and breads she breaks out in a rash.  So she states she really needs the test done and Dr. Earlean Shawl is who she wants to see, but needs referral.

## 2013-08-16 NOTE — Telephone Encounter (Signed)
Have her come in here and get some preliminary tests done first to speed up the referral. Tell her to make sure that she eats things that she knows have gluten to ensure a good test.Appt with me

## 2013-08-20 ENCOUNTER — Ambulatory Visit (INDEPENDENT_AMBULATORY_CARE_PROVIDER_SITE_OTHER): Payer: 59 | Admitting: Family Medicine

## 2013-08-20 ENCOUNTER — Encounter: Payer: Self-pay | Admitting: Family Medicine

## 2013-08-20 VITALS — BP 112/70 | HR 72 | Temp 98.8°F | Ht 61.5 in | Wt 186.0 lb

## 2013-08-20 DIAGNOSIS — L02419 Cutaneous abscess of limb, unspecified: Secondary | ICD-10-CM

## 2013-08-20 DIAGNOSIS — L03116 Cellulitis of left lower limb: Secondary | ICD-10-CM

## 2013-08-20 DIAGNOSIS — L259 Unspecified contact dermatitis, unspecified cause: Secondary | ICD-10-CM

## 2013-08-20 DIAGNOSIS — L309 Dermatitis, unspecified: Secondary | ICD-10-CM

## 2013-08-20 DIAGNOSIS — L03119 Cellulitis of unspecified part of limb: Secondary | ICD-10-CM

## 2013-08-20 MED ORDER — SULFAMETHOXAZOLE-TMP DS 800-160 MG PO TABS: 1.0000 | ORAL_TABLET | Freq: Two times a day (BID) | ORAL | Status: DC

## 2013-08-20 NOTE — Progress Notes (Signed)
Chief Complaint  Patient presents with  . Rash    started on her legs x 1 week ago. Today right arm broke out in a rash, some rash on left. Thinks she may have a gluten intolerence. Broke out in rash 10 minutes after eating chocolate donut holes from [employer] this afternoon. Also has two spots(unrelated) on her left breast that she would like you to look at, had bandaid there and thinks may be infected.    She noticed about 8 days ago an outbreak of red spots on her left lower leg--these seemed different than mosquito bites--they weren't itchy, seemed dry (not vesicular).  It later moved up to her lower abdomen, and then to the right leg.  This morning she noticed a few spots on her arms.  She had hamburger and fries for lunch today, then had munchkins from [employer] for dessert.  She started breaking out on her left forearm shortly after eating the donuts--the new spots on her left forearm were very red, itchy, and spread up the arm.  The rash seems worse when the skin gets hot.  The itching and redness seems to fluctuate in intensity very quickly. Very different from the rash/lesions she has had on her legs. She is wondering if it could be related to gluten sensitivity (she has been reading about it on the internet).  She denies any GI symptoms, bloating, stool changes.  Denies new contacts, exposures.  She had been fishing, but was wearing long pants.  She has been to community pool, but not recently, and no hot tub exposure.  She reports that she has been breaking out on her breasts frequently, and gets rashes frequently.  She states she has seen Dr. Redmond School for this in the past. She scrubs with Dial soap.  She has also used peroxide, epsom salts. She has sensitive skin, and scars easily.  She shows me mosquito bites on her legs that have healed leaving scars.  Past Medical History  Diagnosis Date  . CIN III (cervical intraepithelial neoplasia III)   . HH (hiatus hernia)   . Migraines    . DM (diabetes mellitus) 10/2009  . PCOS (polycystic ovarian syndrome)     Dr. Helane Rima   Past Surgical History  Procedure Laterality Date  . Colonoscopy  03/2008    Dr. Michail Sermon  . Endoscopy  2010    Dr. Amedeo Plenty  . Leep  2008  . Dilation and curettage of uterus  02/2012   History   Social History  . Marital Status: Married    Spouse Name: N/A    Number of Children: N/A  . Years of Education: N/A   Occupational History  . Not on file.   Social History Main Topics  . Smoking status: Never Smoker   . Smokeless tobacco: Never Used  . Alcohol Use: No  . Drug Use: No  . Sexual Activity: Yes   Other Topics Concern  . Not on file   Social History Narrative  . No narrative on file   Outpatient Encounter Prescriptions as of 08/20/2013  Medication Sig Note  . glyBURIDE (DIABETA) 2.5 MG tablet Take 2.5 mg by mouth daily with breakfast. 08/20/2013: From GYN for PCOS  . albuterol (PROVENTIL HFA) 108 (90 BASE) MCG/ACT inhaler Inhale 2 puffs into the lungs every 6 (six) hours as needed for wheezing or shortness of breath.   . [DISCONTINUED] amoxicillin (AMOXIL) 875 MG tablet Take 1 tablet (875 mg total) by mouth 2 (two) times  daily.   . [DISCONTINUED] buPROPion (WELLBUTRIN) 100 MG tablet Take 100 mg by mouth 2 (two) times daily.   . [DISCONTINUED] methylphenidate (CONCERTA) 27 MG CR tablet Take 1 tablet (27 mg total) by mouth every morning.    ROS:  Denies fevers, chills, bleeding/bruising, nausea, vomiting, chest pain, shortness of breath, URI symptoms or other complaints.See HPI.  PHYSICAL EXAM: BP 112/70  Pulse 72  Temp(Src) 98.8 F (37.1 C) (Tympanic)  Ht 5' 1.5" (1.562 m)  Wt 186 lb (84.369 kg)  BMI 34.58 kg/m2 Well developed, pleasant female in no distress  Skin: Two ulcerated lesions on her left breast.  These are from where adhesive was in contact with skin (bandage--the original lesion was between these areas).  The lesion on the left measures 1.5 x 1 cm, and has some  yellow exudate centrally, with surrounding erythema.  The lesion on the right side of the left breast is shallower in ulceration, with no central crusting, and no erythema.  Measures 1cm in length/width.  She has multiple other hyperpigmented/scarred lesions scattered across both breasts (per pt, were bumps/boils that healed on their own).  Left leg--mid-calf, medially there is a papule with central mild ulceration with yellow crust, some induration and surrounding erythema. On the right leg there are just a few lesions, excoriated/scabbed (likely shaved over with razor--all lesions, in a linear fashion, are scabbed).  RUE:  There is one firm papule, with slight scabbing centrally, that appears more consistent with what is on the lower legs.  But there are also many superficial papules (?very small urticaria vs insect bites).  On the antecubital fossa there is redness and some are coalesced.  On the back of the arm, they are more discrete lesions.  No crusting, no induration.  ASSESSMENT/PLAN:  Cellulitis of left lower extremity - cannot r/o MRSA given h/o recurrent lesions on her breasts.  discussed proper wound care.  f/u if not resolving - Plan: sulfamethoxazole-trimethoprim (BACTRIM DS) 800-160 MG per tablet  Dermatitis - appears to have more of an allergic reaction on R arm, starting this afternoon.  Supportive measures with anti-histamines, topical steroids   Infection lesions on left breast and left leg.  Cover for MRSA given h/o ongoing problems (in the breast area).  Possible folliculitis in legs, ?being spread by razor.  Treat with Septra DS.  RUE seems to be a different problem, more of an allergic reaction (vs bites).  Recommend use of claritin or zyrtec, plus benadryl as needed.  Cool compresses and topical over-the-counter hydrocortisone cream 2-3x daily, if needed for itching.  Return for recheck in 1 week if not improving. Risks/side effects of meds reviewed, along with DDX and  proper wound care.

## 2013-08-20 NOTE — Telephone Encounter (Signed)
LEFT WORD FOR WORD MESSAGE

## 2013-08-20 NOTE — Patient Instructions (Signed)
Change out your razor, and start using new razor at least 24 hours after starting antibiotics.  Take the antibiotic twice daily for 10 days.  This is to treat for skin infection.  Use over-the-counter hydrocortisone cream (1%) 2-3 times daily to affected areas, only if needed for itching.  Use zyrtec or claritin (along with benadryl, if needed--this will make you drowsy) to help with the itching and swelling.  Return next week if not better, sooner if much worse.

## 2013-08-21 ENCOUNTER — Emergency Department: Payer: Self-pay | Admitting: Internal Medicine

## 2014-08-15 LAB — HM PAP SMEAR: HM Pap smear: NEGATIVE

## 2016-08-13 ENCOUNTER — Ambulatory Visit: Payer: Self-pay | Admitting: Family Medicine

## 2016-08-24 ENCOUNTER — Ambulatory Visit (INDEPENDENT_AMBULATORY_CARE_PROVIDER_SITE_OTHER): Payer: 59 | Admitting: Family Medicine

## 2016-08-24 ENCOUNTER — Telehealth: Payer: Self-pay

## 2016-08-24 ENCOUNTER — Encounter: Payer: Self-pay | Admitting: Family Medicine

## 2016-08-24 VITALS — BP 112/70 | HR 100 | Temp 98.1°F | Wt 185.0 lb

## 2016-08-24 DIAGNOSIS — J209 Acute bronchitis, unspecified: Secondary | ICD-10-CM

## 2016-08-24 LAB — CBC WITH DIFFERENTIAL/PLATELET
Basophils Absolute: 0 cells/uL (ref 0–200)
Basophils Relative: 0 %
EOS ABS: 460 {cells}/uL (ref 15–500)
EOS PCT: 5 %
HCT: 43 % (ref 35.0–45.0)
HEMOGLOBIN: 14.1 g/dL (ref 11.7–15.5)
Lymphocytes Relative: 43 %
Lymphs Abs: 3956 cells/uL — ABNORMAL HIGH (ref 850–3900)
MCH: 27.7 pg (ref 27.0–33.0)
MCHC: 32.8 g/dL (ref 32.0–36.0)
MCV: 84.5 fL (ref 80.0–100.0)
MONO ABS: 1012 {cells}/uL — AB (ref 200–950)
MPV: 9.6 fL (ref 7.5–12.5)
Monocytes Relative: 11 %
Neutro Abs: 3772 cells/uL (ref 1500–7800)
Neutrophils Relative %: 41 %
Platelets: 336 10*3/uL (ref 140–400)
RBC: 5.09 MIL/uL (ref 3.80–5.10)
RDW: 13.5 % (ref 11.0–15.0)
WBC: 9.2 10*3/uL (ref 4.0–10.5)

## 2016-08-24 MED ORDER — AMOXICILLIN-POT CLAVULANATE 875-125 MG PO TABS: 1.0000 | ORAL_TABLET | Freq: Two times a day (BID) | ORAL | 0 refills | Status: DC

## 2016-08-24 MED ORDER — FLUCONAZOLE 150 MG PO TABS: 150.0000 mg | ORAL_TABLET | Freq: Every day | ORAL | 0 refills | Status: DC

## 2016-08-24 NOTE — Telephone Encounter (Signed)
Have her set up another appointment so I can discuss sleep study

## 2016-08-24 NOTE — Progress Notes (Signed)
   Subjective:    Patient ID: Kayla Sexton, female    DOB: [birth year], 36 y.o.   MRN: [id number]  HPI She is here for consultation concerning a two-week history of started with sore throat, chest congestion, shortness of breath. She was seen in an urgent care center and treated for bronchitis with prednisone, Amoxil and albuterol. Apparently the chest x-ray did show some abnormality but she is unsure exactly what it was. She is still having difficulty with shortness of breath, productive cough, nasal congestion, rhinorrhea, hoarse voice and fatigue. She does not smoke and has no history of allergies.   Review of Systems     Objective:   Physical Exam Alert and in no distress. Tympanic membranes and canals are normal. Pharyngeal area is normal. Neck is supple without adenopathy or thyromegaly. Cardiac exam shows a regular sinus rhythm without murmurs or gallops. Lungs are clear to auscultation.        Assessment & Plan:  Acute bronchitis, unspecified organism - Plan: DG Chest 2 View, fluconazole (DIFLUCAN) 150 MG tablet, amoxicillin-clavulanate (AUGMENTIN) 875-125 MG tablet, CBC with Differential/Platelet, Comprehensive metabolic panel I will follow-up pending blood work and x-rays. Will also get the reports from the urgent care center. Diflucan given and also recommend probiotic to help with vaginitis symptoms. At the end of the encounter then she mentioned the possibility of OSA. I explained it would be appropriate to set up an appointment so we can discuss this in more detail.

## 2016-08-24 NOTE — Telephone Encounter (Signed)
PT questions if she needs to reschedule appt to discuss sleep study and need for it.  CB [phone]. Victorino December

## 2016-08-24 NOTE — Telephone Encounter (Signed)
Pt was notified and pt will call back and schedule an appt

## 2016-08-25 ENCOUNTER — Telehealth: Payer: Self-pay

## 2016-08-25 LAB — COMPREHENSIVE METABOLIC PANEL
ALT: 17 U/L (ref 6–29)
AST: 15 U/L (ref 10–30)
Albumin: 4 g/dL (ref 3.6–5.1)
Alkaline Phosphatase: 67 U/L (ref 33–115)
BILIRUBIN TOTAL: 0.6 mg/dL (ref 0.2–1.2)
BUN: 11 mg/dL (ref 7–25)
CO2: 26 mmol/L (ref 20–31)
CREATININE: 0.74 mg/dL (ref 0.50–1.10)
Calcium: 9 mg/dL (ref 8.6–10.2)
Chloride: 105 mmol/L (ref 98–110)
Glucose, Bld: 96 mg/dL (ref 65–99)
Potassium: 4 mmol/L (ref 3.5–5.3)
SODIUM: 142 mmol/L (ref 135–146)
TOTAL PROTEIN: 6.8 g/dL (ref 6.1–8.1)

## 2016-08-25 NOTE — Telephone Encounter (Signed)
Records rcvd from [facility] placed in your folder for review. Kayla Sexton

## 2016-08-26 ENCOUNTER — Encounter: Payer: Self-pay | Admitting: Family Medicine

## 2016-09-28 ENCOUNTER — Ambulatory Visit (INDEPENDENT_AMBULATORY_CARE_PROVIDER_SITE_OTHER): Payer: 59 | Admitting: Family Medicine

## 2016-09-28 ENCOUNTER — Encounter: Payer: Self-pay | Admitting: Family Medicine

## 2016-09-28 VITALS — BP 110/70 | HR 106 | Resp 18 | Wt 182.0 lb

## 2016-09-28 DIAGNOSIS — K645 Perianal venous thrombosis: Secondary | ICD-10-CM

## 2016-09-28 MED ORDER — HYDROCODONE-ACETAMINOPHEN 5-325 MG PO TABS: 1.0000 | ORAL_TABLET | Freq: Four times a day (QID) | ORAL | 0 refills | Status: DC | PRN

## 2016-09-28 NOTE — Progress Notes (Signed)
   Subjective:    Patient ID: Kayla Sexton, female    DOB: [birth year], 36 y.o.   MRN: [id number]  HPI She complains of a three-day history of rectal swelling and pain. Last night the pain got much worse and OTC medications did not help.   Review of Systems     Objective:   Physical Exam Anal exam shows a thrombosed hemorrhoid at the 3:00 position.       Assessment & Plan:  Thrombosed external hemorrhoid  Lesion was injected with Xylocaine and epinephrine. The hemorrhoidal tissue was unroofed and thrombosed tissue was removed without difficulty. The rest of the tissue did appear normal. Warm tub baths or stand in the shower several times per day. Use a stool softener drink plenty of fluids initially have softer BMs Pain medication as needed Call me if he has any questions

## 2016-09-28 NOTE — Patient Instructions (Addendum)
Warm tub baths or stand in the shower several times per day. Use a stool softener drink plenty of fluids initially have softer BMs Pain medication as needed Call me if he has any questions Hemorrhoids Hemorrhoids are swollen veins in and around the rectum or anus. There are two types of hemorrhoids:  Internal hemorrhoids. These occur in the veins that are just inside the rectum. They may poke through to the outside and become irritated and painful.  External hemorrhoids. These occur in the veins that are outside of the anus and can be felt as a painful swelling or hard lump near the anus.  Most hemorrhoids do not cause serious problems, and they can be managed with home treatments such as diet and lifestyle changes. If home treatments do not help your symptoms, procedures can be done to shrink or remove the hemorrhoids. What are the causes? This condition is caused by increased pressure in the anal area. This pressure may result from various things, including:  Constipation.  Straining to have a bowel movement.  Diarrhea.  Pregnancy.  Obesity.  Sitting for long periods of time.  Heavy lifting or other activity that causes you to strain.  Anal sex.  What are the signs or symptoms? Symptoms of this condition include:  Pain.  Anal itching or irritation.  Rectal bleeding.  Leakage of stool (feces).  Anal swelling.  One or more lumps around the anus.  How is this diagnosed? This condition can often be diagnosed through a visual exam. Other exams or tests may also be done, such as:  Examination of the rectal area with a gloved hand (digital rectal exam).  Examination of the anal canal using a small tube (anoscope).  A blood test, if you have lost a significant amount of blood.  A test to look inside the colon (sigmoidoscopy or colonoscopy).  How is this treated? This condition can usually be treated at home. However, various procedures may be done if dietary  changes, lifestyle changes, and other home treatments do not help your symptoms. These procedures can help make the hemorrhoids smaller or remove them completely. Some of these procedures involve surgery, and others do not. Common procedures include:  Rubber band ligation. Rubber bands are placed at the base of the hemorrhoids to cut off the blood supply to them.  Sclerotherapy. Medicine is injected into the hemorrhoids to shrink them.  Infrared coagulation. A type of light energy is used to get rid of the hemorrhoids.  Hemorrhoidectomy surgery. The hemorrhoids are surgically removed, and the veins that supply them are tied off.  Stapled hemorrhoidopexy surgery. A circular stapling device is used to remove the hemorrhoids and use staples to cut off the blood supply to them.  Follow these instructions at home: Eating and drinking  Eat foods that have a lot of fiber in them, such as whole grains, beans, nuts, fruits, and vegetables. Ask your health care provider about taking products that have added fiber (fiber supplements).  Drink enough fluid to keep your urine clear or pale yellow. Managing pain and swelling  Take warm sitz baths for 20 minutes, 3-4 times a day to ease pain and discomfort.  If directed, apply ice to the affected area. Using ice packs between sitz baths may be helpful. ? Put ice in a plastic bag. ? Place a towel between your skin and the bag. ? Leave the ice on for 20 minutes, 2-3 times a day. General instructions  Take over-the-counter and prescription medicines only as  told by your health care provider.  Use medicated creams or suppositories as told.  Exercise regularly.  Go to the bathroom when you have the urge to have a bowel movement. Do not wait.  Avoid straining to have bowel movements.  Keep the anal area dry and clean. Use wet toilet paper or moist towelettes after a bowel movement.  Do not sit on the toilet for long periods of time. This increases  blood pooling and pain. Contact a health care provider if:  You have increasing pain and swelling that are not controlled by treatment or medicine.  You have uncontrolled bleeding.  You have difficulty having a bowel movement, or you are unable to have a bowel movement.  You have pain or inflammation outside the area of the hemorrhoids. This information is not intended to replace advice given to you by your health care provider. Make sure you discuss any questions you have with your health care provider. Document Released: 01/09/2000 Document Revised: 06/11/2015 Document Reviewed: 09/25/2014 Elsevier Interactive Patient Education  2017 [employer].

## 2016-09-29 ENCOUNTER — Telehealth: Payer: Self-pay

## 2016-09-29 MED ORDER — LIDOCAINE-EPINEPHRINE 2 %-1:100000 IJ SOLN
3.0000 mL | Freq: Once | INTRAMUSCULAR | Status: AC
  Administered 2016-09-29: 3 mL

## 2016-09-29 NOTE — Telephone Encounter (Signed)
Pt states that she is having some clear fluids/bleeding noted on guaze. She still feels a small knot there. Pt states that she is really sore today. Pt states you was going to call in an spray to help relieve pain. Victorino December

## 2016-09-29 NOTE — Telephone Encounter (Signed)
LMTCB

## 2016-09-29 NOTE — Telephone Encounter (Signed)
Ok

## 2016-09-29 NOTE — Telephone Encounter (Signed)
Pt called the office per front desk to discuss concerns with hemorrhoid procedure.  Pt was disconnected before I could speak with her.   LM for pt to CB to the office. Victorino December

## 2016-09-29 NOTE — Telephone Encounter (Signed)
Pt aware. She will get this from pharmacy or try cold moist pads out of the freezer.

## 2016-09-29 NOTE — Addendum Note (Signed)
Addended by: Arley Phenix L on: 09/29/2016 08:22 AM   Modules accepted: Orders

## 2016-09-29 NOTE — Telephone Encounter (Signed)
Check with the pharmacy but I think the medicine is called Americaine

## 2016-09-29 NOTE — Telephone Encounter (Signed)
Spoke with [facility] pharmacist- they do not offer any numbing sprays- but do have dermaplast spray OTC (benzocaine spray). Victorino December

## 2017-03-07 DIAGNOSIS — Z3202 Encounter for pregnancy test, result negative: Secondary | ICD-10-CM | POA: Diagnosis not present

## 2017-03-07 DIAGNOSIS — Z30430 Encounter for insertion of intrauterine contraceptive device: Secondary | ICD-10-CM | POA: Diagnosis not present

## 2017-03-07 DIAGNOSIS — N854 Malposition of uterus: Secondary | ICD-10-CM | POA: Diagnosis not present

## 2017-03-28 DIAGNOSIS — Z30431 Encounter for routine checking of intrauterine contraceptive device: Secondary | ICD-10-CM | POA: Diagnosis not present

## 2017-03-28 DIAGNOSIS — E282 Polycystic ovarian syndrome: Secondary | ICD-10-CM | POA: Diagnosis not present

## 2017-03-28 DIAGNOSIS — Z6834 Body mass index (BMI) 34.0-34.9, adult: Secondary | ICD-10-CM | POA: Diagnosis not present

## 2017-04-29 DIAGNOSIS — Z3200 Encounter for pregnancy test, result unknown: Secondary | ICD-10-CM | POA: Diagnosis not present

## 2017-04-29 DIAGNOSIS — R109 Unspecified abdominal pain: Secondary | ICD-10-CM | POA: Diagnosis not present

## 2017-04-29 DIAGNOSIS — Z30432 Encounter for removal of intrauterine contraceptive device: Secondary | ICD-10-CM | POA: Diagnosis not present

## 2017-05-11 ENCOUNTER — Encounter: Payer: Self-pay | Admitting: Podiatry

## 2017-05-11 ENCOUNTER — Ambulatory Visit (INDEPENDENT_AMBULATORY_CARE_PROVIDER_SITE_OTHER): Payer: BLUE CROSS/BLUE SHIELD

## 2017-05-11 ENCOUNTER — Ambulatory Visit (INDEPENDENT_AMBULATORY_CARE_PROVIDER_SITE_OTHER): Payer: BLUE CROSS/BLUE SHIELD | Admitting: Podiatry

## 2017-05-11 DIAGNOSIS — M779 Enthesopathy, unspecified: Secondary | ICD-10-CM

## 2017-05-11 DIAGNOSIS — S92812A Other fracture of left foot, initial encounter for closed fracture: Secondary | ICD-10-CM | POA: Diagnosis not present

## 2017-05-11 DIAGNOSIS — M778 Other enthesopathies, not elsewhere classified: Secondary | ICD-10-CM

## 2017-05-11 MED ORDER — MELOXICAM 15 MG PO TABS: 15.0000 mg | ORAL_TABLET | Freq: Every day | ORAL | 3 refills | Status: DC

## 2017-05-11 NOTE — Progress Notes (Signed)
  Subjective:  Patient ID: Kayla Sexton, female    DOB: July 22, 1980,  MRN: [id number] HPI Chief Complaint  Patient presents with  . Foot Pain    Patient presents today for  pain in her left 1st mpj joint x 1-2 weeks.  She reports a constant sharp stabbing pain when walking or standing.  She has had previous left foot surgery    She has not taken or tried anything for treatment  . New Patient (Initial Visit)    37 y.o. female presents with the above complaint.   Initially she denies any injury to the toe but states that she may have stepped off or missed a step causing sharp dorsiflexion of the toe.  Review of systems denies fever chills nausea vomiting muscle aches pains calf pain chest pain shortness of breath back pain and headache.  Past Medical History:  Diagnosis Date  . CIN III (cervical intraepithelial neoplasia III)   . DM (diabetes mellitus) ([place]) 10/2009  . HH (hiatus hernia)   . Migraines   . PCOS (polycystic ovarian syndrome)    Dr. Helane Rima   Past Surgical History:  Procedure Laterality Date  . COLONOSCOPY  03/2008   Dr. Michail Sermon  . DILATION AND CURETTAGE OF UTERUS  02/2012  . endoscopy  2010   Dr. Amedeo Plenty  . LEEP  2008    Current Outpatient Medications:  .  metFORMIN (GLUCOPHAGE-XR) 500 MG 24 hr tablet, TAKE 1 (ONE) TABLET BY MOUTH TWICE A DAY, Disp: , Rfl: 3  Allergies  Allergen Reactions  . Adhesive [Tape]   . Vancomycin    Review of Systems Objective:  There were no vitals filed for this visit.  General: Well developed, nourished, in no acute distress, alert and oriented x3   Dermatological: Skin is warm, dry and supple bilateral. Nails x 10 are well maintained; remaining integument appears unremarkable at this time. There are no open sores, no preulcerative lesions, no rash or signs of infection present.  Vascular: Dorsalis Pedis artery and Posterior Tibial artery pedal pulses are 2/4 bilateral with immedate capillary fill time. Pedal hair growth  present. No varicosities and no lower extremity edema present bilateral.   Neruologic: Grossly intact via light touch bilateral. Vibratory intact via tuning fork bilateral. Protective threshold with Semmes Wienstein monofilament intact to all pedal sites bilateral. Patellar and Achilles deep tendon reflexes 2+ bilateral. No Babinski or clonus noted bilateral.   Musculoskeletal: No gross boney pedal deformities bilateral. No pain, crepitus, or limitation noted with foot and ankle range of motion bilateral. Muscular strength 5/5 in all groups tested bilateral.  She has pain over the tibial sesamoid with passive and active plantar flexion as well as sharp dorsiflexion. Gait: Unassisted, Nonantalgic.    Radiographs:  Radiographs taken today demonstrate an osseously mature individual with what appears to be a tibial sesamoid fracture she also has a retained screw to the fifth metatarsal of the right foot.  Assessment & Plan:   Assessment: Tibial sesamoiditis/fracture tibial sesamoid left.    Plan: Discussed etiology pathology conservative or surgical therapies.  I would like to place her in a Darco shoe but she requested a Cam walker.  At this point we dispensed a Cam walker and I will follow-up with her in 3 weeks for re-x-ray.  Also wrote a prescription for Mobic to be sent to her pharmacy.     Dash Cardarelli T. [place], [place]

## 2017-06-01 ENCOUNTER — Ambulatory Visit: Payer: BLUE CROSS/BLUE SHIELD | Admitting: Podiatry

## 2017-07-15 DIAGNOSIS — N390 Urinary tract infection, site not specified: Secondary | ICD-10-CM | POA: Diagnosis not present

## 2017-07-18 DIAGNOSIS — R310 Gross hematuria: Secondary | ICD-10-CM | POA: Diagnosis not present

## 2017-07-18 DIAGNOSIS — N202 Calculus of kidney with calculus of ureter: Secondary | ICD-10-CM | POA: Diagnosis not present

## 2017-07-18 DIAGNOSIS — N390 Urinary tract infection, site not specified: Secondary | ICD-10-CM | POA: Diagnosis not present

## 2017-07-20 ENCOUNTER — Encounter (HOSPITAL_BASED_OUTPATIENT_CLINIC_OR_DEPARTMENT_OTHER): Payer: Self-pay | Admitting: *Deleted

## 2017-07-20 ENCOUNTER — Other Ambulatory Visit: Payer: Self-pay

## 2017-07-20 ENCOUNTER — Other Ambulatory Visit: Payer: Self-pay | Admitting: Urology

## 2017-07-20 NOTE — Progress Notes (Signed)
Spoke w/ pt via phone for pre-op interview.  Npo after mn.  Arrive at [facility].  Need urine preg.  Will take zantac am dos w/ sips of water and if needed take norco.

## 2017-07-20 NOTE — H&P (Signed)
CC/HPI: I have a urinary tract infection.     Kayla Sexton is a 37 yo WF who I last saw in 2011 for a stone. She returns now with recurrent UTI's. She most recently had Group B strep. She had some intermittent hematuria over the last 2 weeks. She had some left flank pain over the last couple of weeks. She went to [facility] and had heme and nitrite in the urine but a culture was negative. She still has the back pain but it is more on the right than the left. The pain is intermittent. She has severe dysuria. She has had 4-5 UTI's over the past year with response to abx. UA today has 3+ blood on the dip but 0-2 RBC's on micro with 5-10 WBC's and a few bacteria. The buring just started this morning. She has good continence. She has had no fever or nausea. She is currently on Cipro.     ALLERGIES: Adhesive    MEDICATIONS: Cipro 500 mg tablet  Excedrin Migraine TABS Oral     GU PSH: None     PSH Notes: Complete Colonoscopy, Cervical Conization Loop Electrode Excision, Colposcopy With Loop Electrode Excision Of The Cervix   NON-GU PSH: Biopsy Cervix W/scope; Leep - 2011 Conization Cervix - 2011 Diagnostic Colonoscopy - 2011    GU PMH: Personal Hx Oth female genital tract diseases, History of polycystic ovarian syndrome - 2014 Ureteral calculus, Calculus of distal right ureter - 2014      PMH Notes:  1898-01-25 00:00:00 - Note: Normal Routine History And Physical Adult  2009-04-14 14:59:42 - Note: Cervical Cancer PCOS   NON-GU PMH: Anxiety, Anxiety (Symptom) - 2014 Asthma, Asthma - 2014 Personal history of other diseases of the digestive system, History of esophageal reflux - 2014 Personal history of other diseases of the nervous system and sense organs, History of migraine headaches - 2014 Vitamin D deficiency, unspecified, Vitamin D Deficiency - 2014    FAMILY HISTORY: Blood In Urine - Mother Breast Cancer - Runs In Family Crohn's Disease - Runs In Family Diabetes - Runs In Family,  Mother Family Health Status Number - Runs In Family nephrolithiasis - Mother non-Hodgkin's lymphoma - Brother Prostate Cancer - Father ulcerative colitis - Father   SOCIAL HISTORY: Marital Status: Divorced Preferred Language: English; Race: White Current Smoking Status: Patient has never smoked.   Tobacco Use Assessment Completed: Used Tobacco in last 30 days? Has never drank.  Drinks 1 caffeinated drink per day. Patient's occupation Customer service manager Rep.     Notes: No children    REVIEW OF SYSTEMS:    GU Review Female:   Patient reports burning /pain with urination. Patient denies get up at night to urinate, frequent urination, leakage of urine, hard to postpone urination, stream starts and stops, trouble starting your stream, being pregnant, and have to strain to urinate.  Gastrointestinal (Upper):   Patient reports indigestion/ heartburn. Patient denies nausea and vomiting.  Gastrointestinal (Lower):   Patient denies diarrhea and constipation.  Constitutional:   Patient reports fatigue. Patient denies fever, night sweats, and weight loss.  Skin:   Patient denies skin rash/ lesion and itching.  Eyes:   Patient denies blurred vision and double vision.  Ears/ Nose/ Throat:   Patient denies sore throat and sinus problems.  Hematologic/Lymphatic:   Patient denies swollen glands and easy bruising.  Cardiovascular:   Patient denies leg swelling and chest pains.  Respiratory:   Patient denies cough and shortness of breath.  Endocrine:  Patient denies excessive thirst.  Musculoskeletal:   Patient reports back pain. Patient denies joint pain.  Neurological:   Patient reports headaches. Patient denies dizziness.  Psychologic:   Patient denies depression and anxiety.   VITAL SIGNS:      07/18/2017 02:17 PM  Weight 192 lb / 87.09 kg  Height 62 in / 157.48 cm  BP 118/87 mmHg  Pulse 81 /min  Temperature 97.4 F / 36.3 C  BMI 35.1 kg/m   MULTI-SYSTEM PHYSICAL EXAMINATION:     Constitutional: Obese. No physical deformities. Normally developed. Good grooming.   Neck: Neck symmetrical, not swollen. Normal tracheal position.  Respiratory: No labored breathing, no use of accessory muscles. Normal breath sounds.  Cardiovascular: Regular rate and rhythm. No murmur, no gallop. Normal temperature, normal extremity pulses, no swelling, no varicosities.  Lymphatic: No enlargement, no tenderness of supraclavicular or neck lymph nodes.  Skin: No paleness, no jaundice, no cyanosis. No lesion, no ulcer, no rash.  Neurologic / Psychiatric: Oriented to time, oriented to place, oriented to person. No depression, no anxiety, no agitation.  Gastrointestinal: Obese abdomen. No mass, no tenderness, no rigidity.   Musculoskeletal: Normal gait and station of head and neck.     PAST DATA REVIEWED:  Source Of History:  Patient  Records Review:   Previous Doctor Records, Previous Patient Records  Urine Test Review:   Urinalysis, Urine Culture  X-Ray Review: C.T. Stone Protocol: Reviewed Films. Discussed With Patient.    Notes:                     Her culture from 6/19 was <10K colonies.    PROCEDURES:         C.T. Urogram - [id number]  She has a radiopaque 6-65mm RLP stone and radiolucent 25mm right proximal stone without obstruction. she has a 29mm ovoid left renal pelvic stone. I don't see distal stones.                Urinalysis w/Scope Dipstick Dipstick Cont'd Micro  Color: Yellow Bilirubin: Neg WBC/hpf: 6 - 10/hpf  Appearance: Cloudy Ketones: Neg RBC/hpf: 0 - 2/hpf  Specific Gravity: 1.015 Blood: 3+ Bacteria: Few (10-25/hpf)  pH: <=5.0 Protein: Trace Cystals: NS (Not Seen)  Glucose: Neg Urobilinogen: 0.2 Casts: NS (Not Seen)    Nitrites: Neg Trichomonas: Not Present    Leukocyte Esterase: Trace Mucous: Present      Epithelial Cells: 0 - 5/hpf      Yeast: NS (Not Seen)      Sperm: Not Present    ASSESSMENT:      ICD-10 Details  1 GU:   Urinary Tract Inf, Unspec site -  N39.0 She doesn't appear to have a UTI and will stop the abx.  2   Gross hematuria - R31.0 She has a 15mm right proximal stone that is 3-400 HU and not visible on the scout view. It is not obstructing. There is a 6-62mm RLP stone that is radiopaque and a 69mm left renal pelvic stone that is 3-500HU and radiolucent on scout. The left renal and ureteral stone could be uric acid. I am going to start her on potassium citrate 2meq tid and check a hypercalcuria profile. I will have her return in 2 weeks with a KUB and renal US. If she remains symptomatic then I will need to consider ureteroscopy.   3   Ureteral calculus - N20.1   4   Renal calculus - N20.0    PLAN:  Medications New Meds: Hydrocodone-Acetaminophen 5 mg-325 mg tablet 1 tablet PO Q 6 H   #15  0 Refill(s)  Potassium Citrate Er 10 meq (1,080 mg) tablet, extended release 1 tablet PO TID   #90  5 Refill(s)    Stop Meds: Tamsulosin HCl - 0.4 MG Oral Capsule Oral  Start: 04/14/2009  Discontinue: 07/18/2017  - Reason: The medication cycle was completed.            Orders Labs Hypercalciura Profile  X-Rays: C.T. Stone Protocol Without Contrast  X-Ray Notes: History:  Hematuria: Yes/No  Patient to see MD after exam: Yes/No  Previous exam: CT / IVP/ US/ KUB/ None  When:  Where:  Diabetic: Yes/ No  BUN/ Creatinine:  Date of last BUN Creatinine:  Weight in pounds:  Allergy- IV Contrast: Yes/ No  Conflicting diabetic meds: Yes/ No  Diabetic Meds:  Prior Authorization #: [id number]           Schedule X-Rays: 2 Weeks - Renal Ultrasound    2 Weeks - KUB  Return Visit/Planned Activity: 2 Weeks - Extender             Note: for KUB and renal US          Document Letter(s):  Created for Patient: Clinical Summary         Notes:   CC: Dr. Jill Alexanders.         Next Appointment:      Next Appointment: 08/01/2017 10:15 AM    Appointment Type: Renal Ultrasound    Location: [facility] [phone]     Provider: Radiology Rm1 Radiology Rm 1    Reason for Visit: 2 wk ck with KUB and RUS- larry    She didn't tolerate the potassium citrate and is having more pain.   She would like to proceed with surgical management.

## 2017-07-21 ENCOUNTER — Encounter (HOSPITAL_BASED_OUTPATIENT_CLINIC_OR_DEPARTMENT_OTHER)
Admission: RE | Disposition: A | Discharge status: Home / Self Care | Payer: Self-pay | Source: Ambulatory Visit | Attending: Urology

## 2017-07-21 ENCOUNTER — Ambulatory Visit (HOSPITAL_BASED_OUTPATIENT_CLINIC_OR_DEPARTMENT_OTHER): Payer: BLUE CROSS/BLUE SHIELD | Admitting: Anesthesiology

## 2017-07-21 ENCOUNTER — Ambulatory Visit (HOSPITAL_BASED_OUTPATIENT_CLINIC_OR_DEPARTMENT_OTHER)
Admission: RE | Admit: 2017-07-21 | Discharge: 2017-07-21 | Disposition: A | Discharge status: Home / Self Care | Payer: BLUE CROSS/BLUE SHIELD | Source: Ambulatory Visit | Attending: Urology | Admitting: Urology

## 2017-07-21 ENCOUNTER — Encounter (HOSPITAL_BASED_OUTPATIENT_CLINIC_OR_DEPARTMENT_OTHER): Payer: Self-pay

## 2017-07-21 DIAGNOSIS — Z79899 Other long term (current) drug therapy: Secondary | ICD-10-CM | POA: Diagnosis not present

## 2017-07-21 DIAGNOSIS — N202 Calculus of kidney with calculus of ureter: Secondary | ICD-10-CM | POA: Insufficient documentation

## 2017-07-21 DIAGNOSIS — Z8541 Personal history of malignant neoplasm of cervix uteri: Secondary | ICD-10-CM | POA: Diagnosis not present

## 2017-07-21 DIAGNOSIS — Z87440 Personal history of urinary (tract) infections: Secondary | ICD-10-CM | POA: Insufficient documentation

## 2017-07-21 DIAGNOSIS — K219 Gastro-esophageal reflux disease without esophagitis: Secondary | ICD-10-CM | POA: Diagnosis not present

## 2017-07-21 DIAGNOSIS — N200 Calculus of kidney: Secondary | ICD-10-CM | POA: Diagnosis not present

## 2017-07-21 DIAGNOSIS — N201 Calculus of ureter: Secondary | ICD-10-CM | POA: Diagnosis not present

## 2017-07-21 DIAGNOSIS — E282 Polycystic ovarian syndrome: Secondary | ICD-10-CM | POA: Insufficient documentation

## 2017-07-21 HISTORY — DX: Personal history of urinary calculi: Z87.442

## 2017-07-21 HISTORY — PX: CYSTOSCOPY WITH RETROGRADE PYELOGRAM, URETEROSCOPY AND STENT PLACEMENT: SHX5789

## 2017-07-21 HISTORY — DX: Nausea with vomiting, unspecified: R11.2

## 2017-07-21 HISTORY — DX: Calculus of kidney: N20.0

## 2017-07-21 HISTORY — DX: Hematuria, unspecified: R31.9

## 2017-07-21 HISTORY — PX: HOLMIUM LASER APPLICATION: SHX5852

## 2017-07-21 HISTORY — DX: Other specified postprocedural states: Z98.890

## 2017-07-21 HISTORY — DX: Gastro-esophageal reflux disease without esophagitis: K21.9

## 2017-07-21 HISTORY — DX: Dysuria: R30.0

## 2017-07-21 HISTORY — DX: Calculus of ureter: N20.1

## 2017-07-21 HISTORY — DX: Personal history of cervical dysplasia: Z87.410

## 2017-07-21 LAB — POCT PREGNANCY, URINE: PREG TEST UR: NEGATIVE

## 2017-07-21 SURGERY — CYSTOURETEROSCOPY, WITH RETROGRADE PYELOGRAM AND STENT INSERTION
Anesthesia: General | Site: Ureter | Laterality: Bilateral

## 2017-07-21 MED ORDER — PHENAZOPYRIDINE HCL 200 MG PO TABS
200.0000 mg | ORAL_TABLET | Freq: Once | ORAL | Status: AC
  Administered 2017-07-21: 200 mg via ORAL
  Filled 2017-07-21: qty 1

## 2017-07-21 MED ORDER — MIDAZOLAM HCL 2 MG/2ML IJ SOLN
INTRAMUSCULAR | Status: AC
  Filled 2017-07-21: qty 2

## 2017-07-21 MED ORDER — OXYCODONE HCL 5 MG PO TABS
ORAL_TABLET | ORAL | Status: AC
  Filled 2017-07-21: qty 1

## 2017-07-21 MED ORDER — CEFAZOLIN SODIUM-DEXTROSE 2-4 GM/100ML-% IV SOLN
2.0000 g | INTRAVENOUS | Status: AC
  Administered 2017-07-21: 2 g via INTRAVENOUS
  Filled 2017-07-21: qty 100

## 2017-07-21 MED ORDER — PROPOFOL 10 MG/ML IV BOLUS
INTRAVENOUS | Status: DC | PRN
  Administered 2017-07-21: 170 mg via INTRAVENOUS

## 2017-07-21 MED ORDER — HYDROMORPHONE HCL 1 MG/ML IJ SOLN
INTRAMUSCULAR | Status: AC
  Filled 2017-07-21: qty 1

## 2017-07-21 MED ORDER — SODIUM CHLORIDE 0.9% FLUSH
3.0000 mL | INTRAVENOUS | Status: DC | PRN
  Filled 2017-07-21: qty 3

## 2017-07-21 MED ORDER — SODIUM CHLORIDE 0.9% FLUSH
3.0000 mL | Freq: Two times a day (BID) | INTRAVENOUS | Status: DC
  Filled 2017-07-21: qty 3

## 2017-07-21 MED ORDER — PHENAZOPYRIDINE HCL 200 MG PO TABS: 200.0000 mg | ORAL_TABLET | Freq: Three times a day (TID) | ORAL | 1 refills | Status: DC | PRN

## 2017-07-21 MED ORDER — SODIUM CHLORIDE 0.9 % IR SOLN
Status: DC | PRN
  Administered 2017-07-21: 3000 mL

## 2017-07-21 MED ORDER — ONDANSETRON HCL 4 MG/2ML IJ SOLN
INTRAMUSCULAR | Status: AC
  Filled 2017-07-21: qty 2

## 2017-07-21 MED ORDER — LIDOCAINE HCL (CARDIAC) PF 100 MG/5ML IV SOSY
PREFILLED_SYRINGE | INTRAVENOUS | Status: DC | PRN
  Administered 2017-07-21: 100 mg via INTRAVENOUS

## 2017-07-21 MED ORDER — DEXAMETHASONE SODIUM PHOSPHATE 4 MG/ML IJ SOLN
INTRAMUSCULAR | Status: DC | PRN
  Administered 2017-07-21: 10 mg via INTRAVENOUS

## 2017-07-21 MED ORDER — PROMETHAZINE HCL 25 MG/ML IJ SOLN
INTRAMUSCULAR | Status: AC
  Filled 2017-07-21: qty 1

## 2017-07-21 MED ORDER — ONDANSETRON HCL 4 MG/2ML IJ SOLN
INTRAMUSCULAR | Status: DC | PRN
  Administered 2017-07-21: 4 mg via INTRAVENOUS

## 2017-07-21 MED ORDER — OXYCODONE HCL 5 MG PO TABS
5.0000 mg | ORAL_TABLET | ORAL | Status: DC | PRN
  Filled 2017-07-21: qty 2

## 2017-07-21 MED ORDER — SODIUM CHLORIDE 0.9 % IV SOLN
250.0000 mL | INTRAVENOUS | Status: DC | PRN
  Filled 2017-07-21: qty 250

## 2017-07-21 MED ORDER — OXYCODONE HCL 5 MG PO TABS
5.0000 mg | ORAL_TABLET | Freq: Once | ORAL | Status: AC | PRN
  Administered 2017-07-21: 5 mg via ORAL
  Filled 2017-07-21: qty 1

## 2017-07-21 MED ORDER — FENTANYL CITRATE (PF) 250 MCG/5ML IJ SOLN
INTRAMUSCULAR | Status: AC
  Filled 2017-07-21: qty 5

## 2017-07-21 MED ORDER — PHENAZOPYRIDINE HCL 100 MG PO TABS
ORAL_TABLET | ORAL | Status: AC
  Filled 2017-07-21: qty 2

## 2017-07-21 MED ORDER — CEFAZOLIN SODIUM-DEXTROSE 2-4 GM/100ML-% IV SOLN
INTRAVENOUS | Status: AC
  Filled 2017-07-21: qty 100

## 2017-07-21 MED ORDER — SCOPOLAMINE 1 MG/3DAYS TD PT72
MEDICATED_PATCH | TRANSDERMAL | Status: AC
  Filled 2017-07-21: qty 1

## 2017-07-21 MED ORDER — LIDOCAINE 2% (20 MG/ML) 5 ML SYRINGE
INTRAMUSCULAR | Status: AC
  Filled 2017-07-21: qty 5

## 2017-07-21 MED ORDER — MIDAZOLAM HCL 5 MG/5ML IJ SOLN
INTRAMUSCULAR | Status: DC | PRN
  Administered 2017-07-21: 2 mg via INTRAVENOUS

## 2017-07-21 MED ORDER — PROMETHAZINE HCL 25 MG/ML IJ SOLN
6.2500 mg | INTRAMUSCULAR | Status: DC | PRN
  Administered 2017-07-21: 6.25 mg via INTRAVENOUS
  Filled 2017-07-21: qty 1

## 2017-07-21 MED ORDER — DEXAMETHASONE SODIUM PHOSPHATE 10 MG/ML IJ SOLN
INTRAMUSCULAR | Status: AC
  Filled 2017-07-21: qty 1

## 2017-07-21 MED ORDER — MORPHINE SULFATE (PF) 2 MG/ML IV SOLN
2.0000 mg | INTRAVENOUS | Status: DC | PRN
  Filled 2017-07-21: qty 1

## 2017-07-21 MED ORDER — ACETAMINOPHEN 650 MG RE SUPP
650.0000 mg | RECTAL | Status: DC | PRN
  Filled 2017-07-21: qty 1

## 2017-07-21 MED ORDER — LACTATED RINGERS IV SOLN
INTRAVENOUS | Status: DC
  Administered 2017-07-21: 08:00:00 via INTRAVENOUS
  Filled 2017-07-21: qty 1000

## 2017-07-21 MED ORDER — ACETAMINOPHEN 325 MG PO TABS
650.0000 mg | ORAL_TABLET | ORAL | Status: DC | PRN
  Filled 2017-07-21: qty 2

## 2017-07-21 MED ORDER — FENTANYL CITRATE (PF) 100 MCG/2ML IJ SOLN
INTRAMUSCULAR | Status: AC
  Filled 2017-07-21: qty 2

## 2017-07-21 MED ORDER — HYDROMORPHONE HCL 1 MG/ML IJ SOLN
0.2500 mg | INTRAMUSCULAR | Status: DC | PRN
  Administered 2017-07-21 (×2): 0.5 mg via INTRAVENOUS
  Filled 2017-07-21: qty 0.5

## 2017-07-21 MED ORDER — TAMSULOSIN HCL 0.4 MG PO CAPS: 0.4000 mg | ORAL_CAPSULE | Freq: Every day | ORAL | 1 refills | Status: DC

## 2017-07-21 MED ORDER — OXYCODONE HCL 5 MG/5ML PO SOLN
5.0000 mg | Freq: Once | ORAL | Status: AC | PRN
  Filled 2017-07-21: qty 5

## 2017-07-21 MED ORDER — KETOROLAC TROMETHAMINE 30 MG/ML IJ SOLN
30.0000 mg | Freq: Once | INTRAMUSCULAR | Status: AC
  Administered 2017-07-21: 30 mg via INTRAVENOUS
  Filled 2017-07-21: qty 1

## 2017-07-21 MED ORDER — SCOPOLAMINE 1 MG/3DAYS TD PT72
1.0000 | MEDICATED_PATCH | TRANSDERMAL | Status: DC
  Administered 2017-07-21: 1.5 mg via TRANSDERMAL
  Filled 2017-07-21: qty 1

## 2017-07-21 MED ORDER — IOHEXOL 300 MG/ML  SOLN
INTRAMUSCULAR | Status: DC | PRN
  Administered 2017-07-21: 9 mL

## 2017-07-21 MED ORDER — PROPOFOL 10 MG/ML IV BOLUS
INTRAVENOUS | Status: AC
  Filled 2017-07-21: qty 20

## 2017-07-21 MED ORDER — FENTANYL CITRATE (PF) 100 MCG/2ML IJ SOLN
INTRAMUSCULAR | Status: DC | PRN
  Administered 2017-07-21 (×3): 50 ug via INTRAVENOUS

## 2017-07-21 MED ORDER — KETOROLAC TROMETHAMINE 30 MG/ML IJ SOLN
INTRAMUSCULAR | Status: AC
  Filled 2017-07-21: qty 1

## 2017-07-21 MED ORDER — HYDROCODONE-ACETAMINOPHEN 5-325 MG PO TABS: 1.0000 | ORAL_TABLET | Freq: Four times a day (QID) | ORAL | 0 refills | Status: DC | PRN

## 2017-07-21 SURGICAL SUPPLY — 29 items
BAG DRAIN URO-CYSTO SKYTR STRL (DRAIN) ×2 IMPLANT
BAG DRN UROCATH (DRAIN) ×1
BASKET STONE 1.7 NGAGE (UROLOGICAL SUPPLIES) ×1 IMPLANT
BASKET ZERO TIP NITINOL 2.4FR (BASKET) IMPLANT
BSKT STON RTRVL ZERO TP 2.4FR (BASKET)
CATH URET 5FR 28IN CONE TIP (BALLOONS)
CATH URET 5FR 28IN OPEN ENDED (CATHETERS) ×1 IMPLANT
CATH URET 5FR 70CM CONE TIP (BALLOONS) IMPLANT
CLOTH BEACON ORANGE TIMEOUT ST (SAFETY) ×2 IMPLANT
ELECT REM PT RETURN 9FT ADLT (ELECTROSURGICAL)
ELECTRODE REM PT RTRN 9FT ADLT (ELECTROSURGICAL) IMPLANT
FIBER LASER FLEXIVA 365 (UROLOGICAL SUPPLIES) IMPLANT
FIBER LASER TRAC TIP (UROLOGICAL SUPPLIES) ×1 IMPLANT
GLOVE BIOGEL PI IND STRL 7.5 (GLOVE) IMPLANT
GLOVE BIOGEL PI INDICATOR 7.5 (GLOVE) ×3
GLOVE SURG SS PI 8.0 STRL IVOR (GLOVE) ×2 IMPLANT
GOWN STRL REUS W/TWL LRG LVL3 (GOWN DISPOSABLE) ×1 IMPLANT
GOWN STRL REUS W/TWL XL LVL3 (GOWN DISPOSABLE) ×2 IMPLANT
GUIDEWIRE 0.038 PTFE COATED (WIRE) IMPLANT
GUIDEWIRE ANG ZIPWIRE 038X150 (WIRE) IMPLANT
GUIDEWIRE STR DUAL SENSOR (WIRE) ×2 IMPLANT
IV NS IRRIG 3000ML ARTHROMATIC (IV SOLUTION) ×2 IMPLANT
KIT TURNOVER CYSTO (KITS) ×2 IMPLANT
MANIFOLD NEPTUNE II (INSTRUMENTS) ×2 IMPLANT
NS IRRIG 500ML POUR BTL (IV SOLUTION) ×2 IMPLANT
PACK CYSTO (CUSTOM PROCEDURE TRAY) ×2 IMPLANT
STENT URET 6FRX24 CONTOUR (STENTS) ×1 IMPLANT
TUBE CONNECTING 12X1/4 (SUCTIONS) ×1 IMPLANT
TUBING UROLOGY SET (TUBING) ×1 IMPLANT

## 2017-07-21 NOTE — Anesthesia Postprocedure Evaluation (Signed)
Anesthesia Post Note  Patient: TAMALYN WADSWORTH  Procedure(s) Performed: CYSTOSCOPY WITH BILATERAL  RETROGRADE BILATERAL URETEROSCOPY AND STENTS  PLACEMENT (Bilateral Ureter) HOLMIUM LASER APPLICATION (Bilateral Ureter)     Patient location during evaluation: PACU Anesthesia Type: General Level of consciousness: awake and alert Pain management: pain level controlled Vital Signs Assessment: post-procedure vital signs reviewed and stable Respiratory status: spontaneous breathing, nonlabored ventilation, respiratory function stable and patient connected to nasal cannula oxygen Cardiovascular status: blood pressure returned to baseline and stable Postop Assessment: no apparent nausea or vomiting Anesthetic complications: no    Last Vitals:  Vitals:   07/21/17 1200 07/21/17 1330  BP: (!) 134/93 129/81  Pulse: 92 68  Resp: 13 12  Temp:  36.8 C  SpO2: 94% 97%    Last Pain:  Vitals:   07/21/17 1200  TempSrc:   PainSc: 6                  Susie Pousson P Cormick Moss

## 2017-07-21 NOTE — Interval H&P Note (Signed)
History and Physical Interval Note:  She passed some grit yesterday and no longer has right flank pain but is having pain on the left.   We will proceed as planned.   07/21/2017 9:39 AM  Kayla Sexton  has presented today for surgery, with the diagnosis of RIGHT URETERAL AND LEFT RENAL STONE  The various methods of treatment have been discussed with the patient and family. After consideration of risks, benefits and other options for treatment, the patient has consented to  Procedure(s): CYSTOSCOPY WITH BILATERAL  RETROGRADE BILATERAL URETEROSCOPY AND STENTS  PLACEMENT (Bilateral) HOLMIUM LASER APPLICATION (Bilateral) as a surgical intervention .  The patient's history has been reviewed, patient examined, no change in status, stable for surgery.  I have reviewed the patient's chart and labs.  Questions were answered to the patient's satisfaction.     Irine Seal

## 2017-07-21 NOTE — Transfer of Care (Signed)
Immediate Anesthesia Transfer of Care Note  Patient: Kayla Sexton  Procedure(s) Performed: CYSTOSCOPY WITH BILATERAL  RETROGRADE BILATERAL URETEROSCOPY AND STENTS  PLACEMENT (Bilateral Ureter) HOLMIUM LASER APPLICATION (Bilateral Ureter)  Patient Location: PACU  Anesthesia Type:General  Level of Consciousness: awake, alert , oriented and patient cooperative  Airway & Oxygen Therapy: Patient Spontanous Breathing and Patient connected to nasal cannula oxygen  Post-op Assessment: Report given to RN and Post -op Vital signs reviewed and stable  Post vital signs: Reviewed and stable  Last Vitals:  Vitals Value Taken Time  BP 133/98 07/21/2017 11:15 AM  Temp 36.8 C 07/21/2017 11:15 AM  Pulse 97 07/21/2017 11:18 AM  Resp 19 07/21/2017 11:18 AM  SpO2 96 % 07/21/2017 11:18 AM  Vitals shown include unvalidated device data.  Last Pain:  Vitals:   07/21/17 0815  TempSrc:   PainSc: 4       Patients Stated Pain Goal: 3 (28/78/67 6720)  Complications: No apparent anesthesia complications

## 2017-07-21 NOTE — Discharge Instructions (Addendum)
Ureteral Stent Implantation, Care After Refer to this sheet in the next few weeks. These instructions provide you with information about caring for yourself after your procedure. Your health care provider may also give you more specific instructions. Your treatment has been planned according to current medical practices, but problems sometimes occur. Call your health care provider if you have any problems or questions after your procedure. What can I expect after the procedure? After the procedure, it is common to have:  Nausea.  Mild pain when you urinate. You may feel this pain in your lower back or lower abdomen. Pain should stop within a few minutes after you urinate. This may last for up to 1 week.  A small amount of blood in your urine for several days.  Follow these instructions at home:  Medicines  Take over-the-counter and prescription medicines only as told by your health care provider.  If you were prescribed an antibiotic medicine, take it as told by your health care provider. Do not stop taking the antibiotic even if you start to feel better.  Do not drive for 24 hours if you received a sedative.  Do not drive or operate heavy machinery while taking prescription pain medicines. Activity  Return to your normal activities as told by your health care provider. Ask your health care provider what activities are safe for you.  Do not lift anything that is heavier than 10 lb (4.5 kg). Follow this limit for 1 week after your procedure, or for as long as told by your health care provider. General instructions  Watch for any blood in your urine. Call your health care provider if the amount of blood in your urine increases.  If you have a catheter: ? Follow instructions from your health care provider about taking care of your catheter and collection bag. ? Do not take baths, swim, or use a hot tub until your health care provider approves.  Drink enough fluid to keep your urine  clear or pale yellow.  Keep all follow-up visits as told by your health care provider. This is important. Contact a health care provider if:  You have pain that gets worse or does not get better with medicine, especially pain when you urinate.  You have difficulty urinating.  You feel nauseous or you vomit repeatedly during a period of more than 2 days after the procedure. Get help right away if:  Your urine is dark red or has blood clots in it.  You are leaking urine (have incontinence).  The end of the stent comes out of your urethra.  You cannot urinate.  You have sudden, sharp, or severe pain in your abdomen or lower back.  You have a fever.  I have sent a script for tamsulosin that is taken 23min after a meal 1 x daily to help pass stone fragments and reduce stent pain.  This med can cause nasal congestion and may cause dizziness if you stand quickly.   I have also given you pyridium for the stent irritation.   This information is not intended to replace advice given to you by your health care provider. Make sure you discuss any questions you have with your health care provider. Document Released: 09/13/2012 Document Revised: 06/19/2015 Document Reviewed: 07/26/2014 Elsevier Interactive Patient Education  2018 [employer] [facility] Care Instructions  Activity: Get plenty of rest for the remainder of the day. A responsible individual must stay with you for 24 hours following the  procedure.  For the next 24 hours, DO NOT: -Drive a car -Paediatric nurse -Drink alcoholic beverages -Take any medication unless instructed by your physician -Make any legal decisions or sign important papers.  Meals: Start with liquid foods such as gelatin or soup. Progress to regular foods as tolerated. Avoid greasy, spicy, heavy foods. If nausea and/or vomiting occur, drink only clear liquids until the nausea and/or vomiting subsides. Call your physician if vomiting  continues.  Special Instructions/Symptoms: Your throat may feel dry or sore from the anesthesia or the breathing tube placed in your throat during surgery. If this causes discomfort, gargle with warm salt water. The discomfort should disappear within 24 hours.  If you had a scopolamine patch placed behind your ear for the management of post- operative nausea and/or vomiting:  1. The medication in the patch is effective for 72 hours, after which it should be removed.  Wrap patch in a tissue and discard in the trash. Wash hands thoroughly with soap and water. 2. You may remove the patch earlier than 72 hours if you experience unpleasant side effects which may include dry mouth, dizziness or visual disturbances. 3. Avoid touching the patch. Wash your hands with soap and water after contact with the patch.

## 2017-07-21 NOTE — Anesthesia Preprocedure Evaluation (Addendum)
Anesthesia Evaluation  Patient identified by MRN, date of birth, ID band Patient awake    Reviewed: Allergy & Precautions, NPO status , Patient's Chart, lab work & pertinent test results  History of Anesthesia Complications (+) PONV and history of anesthetic complications  Airway Mallampati: III  TM Distance: >3 FB Neck ROM: Full    Dental no notable dental hx.    Pulmonary neg pulmonary ROS,    Pulmonary exam normal breath sounds clear to auscultation       Cardiovascular negative cardio ROS Normal cardiovascular exam Rhythm:Regular Rate:Normal  ECG: NSR, rate 74   Neuro/Psych  Headaches, negative psych ROS   GI/Hepatic Neg liver ROS, hiatal hernia, GERD  Medicated and Controlled,  Endo/Other  PCOS (polycystic ovarian syndrome)  Renal/GU Renal disease     Musculoskeletal negative musculoskeletal ROS (+)   Abdominal (+) + obese,   Peds  Hematology negative hematology ROS (+)   Anesthesia Other Findings RIGHT URETERAL AND LEFT RENAL STONE  Reproductive/Obstetrics                            Anesthesia Physical Anesthesia Plan  ASA: II  Anesthesia Plan: General   Post-op Pain Management:    Induction: Intravenous  PONV Risk Score and Plan: 4 or greater and Midazolam, Ondansetron, Dexamethasone, Treatment may vary due to age or medical condition and Scopolamine patch - Pre-op  Airway Management Planned: LMA  Additional Equipment:   Intra-op Plan:   Post-operative Plan: Extubation in OR  Informed Consent: I have reviewed the patients History and Physical, chart, labs and discussed the procedure including the risks, benefits and alternatives for the proposed anesthesia with the patient or authorized representative who has indicated his/her understanding and acceptance.   Dental advisory given  Plan Discussed with: CRNA  Anesthesia Plan Comments:        Anesthesia Quick  Evaluation

## 2017-07-21 NOTE — Op Note (Signed)
Procedure: 1. Cystoscopy with bilateral retrograde pyelograms with interpretation. 2.  Right ureteroscopic stone extraction. 3.  Left ureteroscopy with holmium laser application and insertion of left double-J stent.  Preop diagnosis: 5 mm right mid ureteral stone and 13 mm left renal pelvic stone.  Postop diagnosis: Same.  Surgeon: Dr. Irine Seal.  Anesthesia: General.  Specimen: Right ureteral stone and fragments of left  renal stone.  Drain: 6 [place] by 24 cm contour left double-J stent.  EBL: Minimal.  Complications: None.  Indications: Kayla Sexton is a 37 year old white female who was seen in the office with a 5 mm right mid ureteral stone and a 13 mm left renal pelvic stone.  The stones were radiolucent and she was initially started on potassium citrate but had intestinal upset and further pain and is elected to undergo ureteroscopic management.  Procedure: She was taken operating room she was given 2 g of Ancef.  General anesthetic was induced.  He was placed in the lithotomy position and fitted with PAS hose.  She was prepped with Betadine solution and draped in usual sterile fashion.  Cystoscopy was performed using a 23 [place] scope and 30 degree lens.  Examination revealed normal urethra.  Bladder wall was smooth and pale without tumor stones or inflammation.  Ureteral orifices were delicate but otherwise unremarkable and in the normal anatomic position.  A 5 French open catheter was used to perform bilateral retrograde pyelograms.  Omnipaque with the contrast.  The right retrograde pyelogram revealed a normal caliber ureter without obstruction but there was a filling defect in the upper mid ureter consistent with the stone seen on CT scan.  Left retrograde pyelogram demonstrated a normal caliber ureter.  There was a filling defect in the renal pelvis consistent with a stone.  The study was otherwise unremarkable.  The right ureteral orifice was then cannulated with a 4.5 French  semirigid ureteroscope which I was able to advance to the level of the stone easily with the aid of a sensor wire.  Once the stone was visualized it was grasped with an engage basket and removed intact without difficulty.  It was not felt that the stent was indicated.  A guidewire was then passed up the left ureteral orifice using the ureteroscope and the ureteroscope was then removed.  A 12 French inner core over 35 cm access sheath was then passed over the wire.  I was able to advance it easily to the mid ureter but no further.  I then assembled the sheath and passed it to the level of the mid ureter.  The inner core was then removed and the single lumen digital flexible ureteroscope was then passed alongside the wire but I was unable to advance the scope to the kidney with the wire in place.  The wire was then removed and I was able to advance the scope to the kidney where the stone was readily visualized.  The stone was then engaged with a 200 m tract tip laser fiber initially set on 0.5 W and 10 Hz.  The stone was broken into dust and small fragments and as the treatment progressed the frequency was increased from 20 and then to 45 Hz.  After the major stone and then fragmented inspection of the lower calyx demonstrated a second stone which was also engaged in the laser and broken into small fragments and dust.  At this point an engage basket was passed through the scope and the larger fragments were engaged but they were  found to be quite small approximately 2 mm.  Once it appeared that all of the significant fragments that either been broken up to dust or tiny grip with the laser the ureteroscope was removed and a guidewire was replaced to the kidney  The cystoscope was then reinserted over the wire and a 6 French 24 cm contour double-J stent was passed to the kidney without difficulty.  The wire was removed leaving a good coil in the kidney and in the bladder.  The bladder was drained and the cystoscope  was removed.  She was taken down from lithotomy position, her anesthetic was reversed and she was moved to recovery in stable condition.  There were no complications.  The stone fragments were given to her boyfriend.

## 2017-07-21 NOTE — Anesthesia Procedure Notes (Signed)
Procedure Name: LMA Insertion Date/Time: 07/21/2017 9:48 AM Performed by: Lyndee Leo, CRNA Pre-anesthesia Checklist: Patient identified, Emergency Drugs available, Suction available and Patient being monitored Patient Re-evaluated:Patient Re-evaluated prior to induction Oxygen Delivery Method: Circle system utilized Preoxygenation: Pre-oxygenation with 100% oxygen Induction Type: IV induction Ventilation: Mask ventilation without difficulty LMA: LMA inserted LMA Size: 4.0 Number of attempts: 1 Airway Equipment and Method: Bite block Placement Confirmation: positive ETCO2 Tube secured with: Tape Dental Injury: Teeth and Oropharynx as per pre-operative assessment

## 2017-07-22 ENCOUNTER — Encounter (HOSPITAL_BASED_OUTPATIENT_CLINIC_OR_DEPARTMENT_OTHER): Payer: Self-pay | Admitting: Urology

## 2017-07-24 ENCOUNTER — Emergency Department (HOSPITAL_COMMUNITY)
Admission: EM | Admit: 2017-07-24 | Discharge: 2017-07-25 | Disposition: A | Discharge status: Home / Self Care | Payer: BLUE CROSS/BLUE SHIELD | Attending: Emergency Medicine | Admitting: Emergency Medicine

## 2017-07-24 DIAGNOSIS — Z960 Presence of urogenital implants: Secondary | ICD-10-CM

## 2017-07-24 DIAGNOSIS — Z79899 Other long term (current) drug therapy: Secondary | ICD-10-CM | POA: Diagnosis not present

## 2017-07-24 DIAGNOSIS — Z7982 Long term (current) use of aspirin: Secondary | ICD-10-CM | POA: Diagnosis not present

## 2017-07-24 DIAGNOSIS — R109 Unspecified abdominal pain: Secondary | ICD-10-CM | POA: Diagnosis not present

## 2017-07-24 DIAGNOSIS — R1111 Vomiting without nausea: Secondary | ICD-10-CM | POA: Diagnosis not present

## 2017-07-24 DIAGNOSIS — R1032 Left lower quadrant pain: Secondary | ICD-10-CM | POA: Insufficient documentation

## 2017-07-24 DIAGNOSIS — R110 Nausea: Secondary | ICD-10-CM | POA: Diagnosis not present

## 2017-07-24 NOTE — ED Notes (Signed)
Bed: WA16 Expected date:  Expected time:  Means of arrival:  Comments: ems 

## 2017-07-25 ENCOUNTER — Encounter (HOSPITAL_COMMUNITY): Payer: Self-pay

## 2017-07-25 ENCOUNTER — Emergency Department (HOSPITAL_COMMUNITY): Payer: BLUE CROSS/BLUE SHIELD

## 2017-07-25 ENCOUNTER — Other Ambulatory Visit: Payer: Self-pay

## 2017-07-25 DIAGNOSIS — R109 Unspecified abdominal pain: Secondary | ICD-10-CM | POA: Diagnosis not present

## 2017-07-25 LAB — URINALYSIS, ROUTINE W REFLEX MICROSCOPIC
BACTERIA UA: NONE SEEN
Bilirubin Urine: NEGATIVE
Glucose, UA: NEGATIVE mg/dL
Ketones, ur: 5 mg/dL — AB
Nitrite: POSITIVE — AB
PROTEIN: 100 mg/dL — AB
RBC / HPF: >50 RBC/hpf — ABNORMAL HIGH (ref 0–5)
Specific Gravity, Urine: 1.018 (ref 1.005–1.030)
pH: 6 (ref 5.0–8.0)

## 2017-07-25 LAB — LIPASE, BLOOD: Lipase: 27 U/L (ref 11–51)

## 2017-07-25 LAB — COMPREHENSIVE METABOLIC PANEL
ALBUMIN: 3.7 g/dL (ref 3.5–5.0)
ALT: 17 U/L (ref 0–44)
ANION GAP: 7 (ref 5–15)
AST: 17 U/L (ref 15–41)
Alkaline Phosphatase: 66 U/L (ref 38–126)
BUN: 15 mg/dL (ref 6–20)
CO2: 29 mmol/L (ref 22–32)
Calcium: 8.4 mg/dL — ABNORMAL LOW (ref 8.9–10.3)
Chloride: 106 mmol/L (ref 98–111)
Creatinine, Ser: 0.87 mg/dL (ref 0.44–1.00)
GFR calc Af Amer: >60 mL/min (ref >60)
GFR calc non Af Amer: >60 mL/min (ref >60)
GLUCOSE: 122 mg/dL — AB (ref 70–99)
POTASSIUM: 3.6 mmol/L (ref 3.5–5.1)
SODIUM: 142 mmol/L (ref 135–145)
Total Bilirubin: 1.1 mg/dL (ref 0.3–1.2)
Total Protein: 6.8 g/dL (ref 6.5–8.1)

## 2017-07-25 LAB — CBC
HEMATOCRIT: 40.1 % (ref 36.0–46.0)
HEMOGLOBIN: 12.8 g/dL (ref 12.0–15.0)
MCH: 27.1 pg (ref 26.0–34.0)
MCHC: 31.9 g/dL (ref 30.0–36.0)
MCV: 85 fL (ref 78.0–100.0)
Platelets: 331 10*3/uL (ref 150–400)
RBC: 4.72 MIL/uL (ref 3.87–5.11)
RDW: 14.4 % (ref 11.5–15.5)
WBC: 12.8 10*3/uL — ABNORMAL HIGH (ref 4.0–10.5)

## 2017-07-25 LAB — I-STAT BETA HCG BLOOD, ED (MC, WL, AP ONLY)

## 2017-07-25 MED ORDER — ONDANSETRON HCL 4 MG/2ML IJ SOLN
4.0000 mg | Freq: Once | INTRAMUSCULAR | Status: AC
  Administered 2017-07-25: 4 mg via INTRAVENOUS
  Filled 2017-07-25: qty 2

## 2017-07-25 MED ORDER — KETOROLAC TROMETHAMINE 30 MG/ML IJ SOLN
30.0000 mg | Freq: Once | INTRAMUSCULAR | Status: AC
  Administered 2017-07-25: 30 mg via INTRAVENOUS
  Filled 2017-07-25: qty 1

## 2017-07-25 MED ORDER — ONDANSETRON 4 MG PO TBDP: 4.0000 mg | ORAL_TABLET | Freq: Three times a day (TID) | ORAL | 0 refills | Status: DC | PRN

## 2017-07-25 MED ORDER — OXYCODONE-ACETAMINOPHEN 5-325 MG PO TABS: 1.0000 | ORAL_TABLET | ORAL | 0 refills | Status: DC | PRN

## 2017-07-25 MED ORDER — HYDROMORPHONE HCL 1 MG/ML IJ SOLN
1.0000 mg | Freq: Once | INTRAMUSCULAR | Status: AC
  Administered 2017-07-25: 1 mg via INTRAVENOUS
  Filled 2017-07-25: qty 1

## 2017-07-25 MED ORDER — SODIUM CHLORIDE 0.9 % IV SOLN
1.0000 g | Freq: Once | INTRAVENOUS | Status: AC
  Administered 2017-07-25: 1 g via INTRAVENOUS
  Filled 2017-07-25: qty 10

## 2017-07-25 MED ORDER — CEPHALEXIN 500 MG PO CAPS: 500.0000 mg | ORAL_CAPSULE | Freq: Three times a day (TID) | ORAL | 0 refills | Status: DC

## 2017-07-25 NOTE — ED Notes (Signed)
Discharge instructions reviewed with pt. Pt verbalized understanding. Pt to follow up with urology. PIV removed. Pt brought to waiting room via wheelchair.

## 2017-07-25 NOTE — Discharge Instructions (Signed)
Take the prescribed medication as directed.  Rest, drink fluids. Can try miralax-- 1 cap full in 8oz liquis (apple juice is good with the fiber content). Follow-up with urology-- Return to the ED for new or worsening symptoms.

## 2017-07-25 NOTE — ED Notes (Signed)
Urine culture sent down with UA. 

## 2017-07-25 NOTE — ED Provider Notes (Signed)
[name] DEPT Provider Note   CSN: [id number] Arrival date & time: 07/24/17  2356     History   Chief Complaint Chief Complaint  Patient presents with  . Flank Pain    HPI Kayla Sexton is a 37 y.o. female.  The history is provided by the patient and medical records.     37 y.o. F with hx of CIN III, dysuria, GERD, migraine headaches, hx of kidney stones with recent left ureteral stenting on 07/21/17 with Dr. Jeffie Pollock, presenting to the ED with uncontrolled pain of left flank.  Patient states she has been taking her home medications that she was given from urology but has not had any relief.  She denies fever/chills.  Has had nausea/vomiting and decreased PO intake today.  States she is just "miserable" and cannot get comfortable.  This is her first time with ureteral stent.  Past Medical History:  Diagnosis Date  . CIN III (cervical intraepithelial neoplasia III)   . Dysuria   . GERD (gastroesophageal reflux disease)   . Hematuria   . HH (hiatus hernia)   . History of cervical dysplasia 2006   CIN1 and CIN2  . History of kidney stones   . Migraines   . PCOS (polycystic ovarian syndrome)    Dr. Helane Rima  . PONV (postoperative nausea and vomiting)   . Renal calculus, left   . Right ureteral calculus     Patient Active Problem List   Diagnosis Date Noted  . ADD (attention deficit disorder) 03/17/2011    Past Surgical History:  Procedure Laterality Date  . CERVICAL BIOPSY  W/ LOOP ELECTRODE EXCISION  12-04-2004   dr Helane Rima  [facility]  . COLONOSCOPY  03/2008   Dr. Michail Sermon  . COMBINED HYSTEROSCOPY DIAGNOSTIC / D&C  02-28-2012   dr Helane Rima   @ [facility]  . CYSTOSCOPY WITH RETROGRADE PYELOGRAM, URETEROSCOPY AND STENT PLACEMENT Bilateral 07/21/2017   Procedure: CYSTOSCOPY WITH BILATERAL  RETROGRADE BILATERAL URETEROSCOPY AND STENTS  PLACEMENT;  Surgeon: Irine Seal, MD;  Location: [facility];  Service: Urology;  Laterality: Bilateral;  .  endoscopy  2010   Dr. Amedeo Plenty  . FOOT SURGERY Left 2015   5th bunionectomy w/ 2nd digit w/ fusion , retained hardware w/ 5th toe  . HOLMIUM LASER APPLICATION Bilateral 2/35/5732   Procedure: HOLMIUM LASER APPLICATION;  Surgeon: Irine Seal, MD;  Location: [facility];  Service: Urology;  Laterality: Bilateral;     OB History   None      Home Medications    Prior to Admission medications   Medication Sig Start Date End Date Taking? Authorizing Provider  Aspirin-Acetaminophen-Caffeine (EXCEDRIN MIGRAINE PO) Take by mouth as needed.    [provider]  HYDROcodone-acetaminophen (NORCO/VICODIN) 5-325 MG tablet Take 1 tablet by mouth every 6 (six) hours as needed for moderate pain. 07/21/17   Irine Seal, MD  ibuprofen (ADVIL,MOTRIN) 200 MG tablet Take 200 mg by mouth every 6 (six) hours as needed.    [provider]  phenazopyridine (PYRIDIUM) 200 MG tablet Take 1 tablet (200 mg total) by mouth 3 (three) times daily as needed for pain. 07/21/17   Irine Seal, MD  potassium citrate (UROCIT-K) 10 MEQ (1080 MG) SR tablet Take 10 mEq by mouth 3 (three) times daily with meals.    [provider]  ranitidine (ZANTAC) 150 MG tablet Take 150 mg by mouth as needed for heartburn.    [provider]  tamsulosin (FLOMAX) 0.4  MG CAPS capsule Take 1 capsule (0.4 mg total) by mouth daily. 07/21/17   Irine Seal, MD    Family History History reviewed. No pertinent family history.  Social History Social History   Tobacco Use  . Smoking status: Never Smoker  . Smokeless tobacco: Never Used  Substance Use Topics  . Alcohol use: No  . Drug use: No     Allergies   Adhesive [tape] and Vancomycin   Review of Systems Review of Systems  Genitourinary: Positive for flank pain.  All other systems reviewed and are negative.    Physical Exam Updated Vital Signs BP (!) 141/83 (BP Location: Left Arm)   Pulse 99   Temp 98.4 F (36.9 C) (Oral)    Resp 20   Ht 5\' 2"  (1.575 m)   Wt 84.4 kg (186 lb)   SpO2 98%   BMI 34.02 kg/m   Physical Exam  Constitutional: She is oriented to person, place, and time. She appears well-developed and well-nourished.  Appears uncomfortable  HENT:  Head: Normocephalic and atraumatic.  Mouth/Throat: Oropharynx is clear and moist.  Eyes: Pupils are equal, round, and reactive to light. Conjunctivae and EOM are normal.  Neck: Normal range of motion.  Cardiovascular: Normal rate, regular rhythm and normal heart sounds.  Pulmonary/Chest: Effort normal and breath sounds normal. No stridor. No respiratory distress.  Abdominal: Soft. Bowel sounds are normal. There is tenderness. There is no rebound.  Tenderness all throughout left abdomen and flank  Musculoskeletal: Normal range of motion.  Neurological: She is alert and oriented to person, place, and time.  Skin: Skin is warm and dry.  Psychiatric: She has a normal mood and affect.  Nursing note and vitals reviewed.    ED Treatments / Results  Labs (all labs ordered are listed, but only abnormal results are displayed) Labs Reviewed  COMPREHENSIVE METABOLIC PANEL - Abnormal; Notable for the following components:      Result Value   Glucose, Bld 122 (*)    Calcium 8.4 (*)    All other components within normal limits  CBC - Abnormal; Notable for the following components:   WBC 12.8 (*)    All other components within normal limits  URINALYSIS, ROUTINE W REFLEX MICROSCOPIC - Abnormal; Notable for the following components:   Color, Urine AMBER (*)    APPearance HAZY (*)    Hgb urine dipstick LARGE (*)    Ketones, ur 5 (*)    Protein, ur 100 (*)    Nitrite POSITIVE (*)    Leukocytes, UA TRACE (*)    RBC / HPF >50 (*)    All other components within normal limits  URINE CULTURE  LIPASE, BLOOD  I-STAT BETA HCG BLOOD, ED ([facility], [facility], [facility] ONLY)    EKG None  Radiology Dg Abd 1 View  Result Date: 07/25/2017 CLINICAL DATA:  37 year old female with  left flank pain. Left ureteral stent. EXAM: ABDOMEN - 1 VIEW COMPARISON:  CT of the abdomen pelvis dated 07/18/2017 FINDINGS: Left ureteral stent with proximal tip in the region of the left renal silhouette and distal pigtail tip in the pelvis over the bladder. No definite radiopaque calculi identified. Multiple phleboliths noted over the pelvis. There is no bowel dilatation or evidence of obstruction. The osseous structures and soft tissues appear unremarkable. IMPRESSION: Left ureteral stent as described. Electronically Signed   By: Anner Crete M.D.   On: 07/25/2017 02:07    Procedures Procedures (including critical care time)  Medications Ordered in ED  Medications  HYDROmorphone (DILAUDID) injection 1 mg (1 mg Intravenous Given 07/25/17 0146)  ondansetron (ZOFRAN) injection 4 mg (4 mg Intravenous Given 07/25/17 0146)  cefTRIAXone (ROCEPHIN) 1 g in sodium chloride 0.9 % 100 mL IVPB (0 g Intravenous Stopped 07/25/17 0216)  ketorolac (TORADOL) 30 MG/ML injection 30 mg (30 mg Intravenous Given 07/25/17 0243)  HYDROmorphone (DILAUDID) injection 1 mg (1 mg Intravenous Given 07/25/17 0347)     Initial Impression / Assessment and Plan / ED Course  I have reviewed the triage vital signs and the nursing notes.  Pertinent labs & imaging results that were available during my care of the patient were reviewed by me and considered in my medical decision making (see chart for details).  37 year old female here with left-sided flank pain.  She had left ureteral stent placed by Dr. Jeffie Pollock on 07/21/2017.  Has been doing well until this evening.  States she cannot get comfortable and home medications are not helping.  She is afebrile and nontoxic.  She does appear uncomfortable.  Diffuse tenderness throughout left flank and left abdomen without peritoneal signs.  UA was sent and does reveal nitrites as well as 6-10 WBCs, no bacteria visualized.  In light of her stent placement, do feel it would be wise to cover her  with antibiotics.  Basic labs have been sent.  Will get KUB to confirm stent placement.  IV medications ordered.  3:33 AM Patient feeling significantly better after IV pain medications here.  She is much more comfortable.  Stent still appears to be good placement on KUB.  Labs reassuring.  Vitals are stable.  She is nontoxic in appearance.  Feel she is stable for discharge home.  Does not feel that the hydrocodone is working for her, will switch to oxycodone.  She also has continued vomiting despite the Phenergan, will switch to Zofran.  I recommended that she contact the urology office in the morning and let them know she was seen in the ED this weekend.  She understands to return here for any new or worsening symptoms, specifically new fever, chills, uncontrolled pain or vomiting, etc.  Final Clinical Impressions(s) / ED Diagnoses   Final diagnoses:  Status post placement of ureteral stent  Left flank pain    ED Discharge Orders        Ordered    oxyCODONE-acetaminophen (PERCOCET) 5-325 MG tablet  Every 4 hours PRN     07/25/17 0335    ondansetron (ZOFRAN ODT) 4 MG disintegrating tablet  Every 8 hours PRN     07/25/17 0335    cephALEXin (KEFLEX) 500 MG capsule  3 times daily     07/25/17 0335       Larene Pickett, PA-C 07/25/17 0413    Varney Biles, MD 08/02/17 [phone]

## 2017-07-25 NOTE — ED Triage Notes (Addendum)
Pt presents to ED via EMS for L flank pain. Pt recently treated for kidney stone and had stent placed on Thursday. Pt developed acute pain and nausea tonight. Normal urination reported. Pt took motrin and oxycodone with no relief. EMS gave pt 4mg  of zofran.

## 2017-07-26 DIAGNOSIS — N202 Calculus of kidney with calculus of ureter: Secondary | ICD-10-CM | POA: Diagnosis not present

## 2017-07-26 LAB — URINE CULTURE: Culture: NO GROWTH

## 2017-07-29 DIAGNOSIS — N390 Urinary tract infection, site not specified: Secondary | ICD-10-CM | POA: Diagnosis not present

## 2017-07-29 DIAGNOSIS — R1084 Generalized abdominal pain: Secondary | ICD-10-CM | POA: Diagnosis not present

## 2017-07-29 DIAGNOSIS — N130 Hydronephrosis with ureteropelvic junction obstruction: Secondary | ICD-10-CM | POA: Diagnosis not present

## 2017-07-29 DIAGNOSIS — R8271 Bacteriuria: Secondary | ICD-10-CM | POA: Diagnosis not present

## 2017-10-16 DIAGNOSIS — N390 Urinary tract infection, site not specified: Secondary | ICD-10-CM | POA: Diagnosis not present

## 2017-11-21 ENCOUNTER — Encounter: Payer: Self-pay | Admitting: Family Medicine

## 2017-11-21 ENCOUNTER — Ambulatory Visit: Payer: BLUE CROSS/BLUE SHIELD | Admitting: Family Medicine

## 2017-11-21 VITALS — BP 108/72 | HR 77 | Temp 97.6°F | Wt 193.2 lb

## 2017-11-21 DIAGNOSIS — R0683 Snoring: Secondary | ICD-10-CM | POA: Diagnosis not present

## 2017-11-21 DIAGNOSIS — E669 Obesity, unspecified: Secondary | ICD-10-CM | POA: Diagnosis not present

## 2017-11-21 NOTE — Progress Notes (Signed)
   Subjective:    Patient ID: Kayla Sexton, female    DOB: [birth year], 37 y.o.   MRN: [id number]  HPI She is here for evaluation for possible sleep apnea.  She states that sometimes her snoring actually wakes her up.  Her boyfriend says that she can not breathing for as long as 25 to 30 seconds.   Review of Systems     Objective:   Physical Exam Alert and in no distress.  Epworth scale of 12.       Assessment & Plan:  Snoring - Plan: Home sleep test  Obesity (BMI 30-39.9) I discussed the fact that she could easily have sleep apnea.  Also mentioned weight reduction.  We will discuss this more at a later date.

## 2017-11-25 ENCOUNTER — Telehealth: Payer: Self-pay

## 2017-11-25 NOTE — Telephone Encounter (Signed)
Left detailed message for pt letting her know we put an order in for home sleep test but we are waiting on insurance form [facility] to determine if insurance will pay for that or in lab test

## 2017-11-25 NOTE — Telephone Encounter (Signed)
Patient called to inquire about whether she's having her sleep study done at [facility] or whether it will be done at Home. She's not sure which one her insurance was willing to cover. Please advise. Patient is wanting a call back.

## 2017-11-29 ENCOUNTER — Telehealth: Payer: Self-pay

## 2017-11-29 NOTE — Telephone Encounter (Signed)
Pt was advised that sleep study was contacted and they will try and get sleep study before the end of the year. LVM [place]

## 2017-12-06 ENCOUNTER — Ambulatory Visit (HOSPITAL_BASED_OUTPATIENT_CLINIC_OR_DEPARTMENT_OTHER): Payer: BLUE CROSS/BLUE SHIELD

## 2017-12-12 ENCOUNTER — Ambulatory Visit (HOSPITAL_BASED_OUTPATIENT_CLINIC_OR_DEPARTMENT_OTHER): Payer: BLUE CROSS/BLUE SHIELD | Attending: Family Medicine | Admitting: Internal Medicine

## 2017-12-12 VITALS — Ht 62.0 in | Wt 193.0 lb

## 2017-12-12 DIAGNOSIS — G4733 Obstructive sleep apnea (adult) (pediatric): Secondary | ICD-10-CM | POA: Diagnosis not present

## 2017-12-12 DIAGNOSIS — R0683 Snoring: Secondary | ICD-10-CM | POA: Diagnosis not present

## 2017-12-17 DIAGNOSIS — G4733 Obstructive sleep apnea (adult) (pediatric): Secondary | ICD-10-CM | POA: Diagnosis not present

## 2017-12-17 NOTE — Procedures (Signed)
   Patient Name: Kayla Sexton, Kayla Sexton Date: 12/12/2017 Gender: Female D.O.B: [birth year] Age (years): 37 Referring Provider: Denita Lung Height (inches): 9 Interpreting Physician: Baird Lyons MD, ABSM Weight (lbs): 193 RPSGT: Jacolyn Reedy BMI: 35 MRN: [id number] Neck Size: 14.50  CLINICAL INFORMATION Sleep Study Type: HST Indication for sleep study: Snoring Epworth Sleepiness Score: 16  SLEEP STUDY TECHNIQUE A multi-channel overnight portable sleep study was performed. The channels recorded were: nasal airflow, thoracic respiratory movement, and oxygen saturation with a pulse oximetry. Snoring was also monitored.  MEDICATIONS Patient self administered medications include: none reported.  SLEEP ARCHITECTURE Patient was studied for 426.3 minutes. The sleep efficiency was 100.0 % and the patient was supine for 95.9%. The arousal index was 0.0 per hour.  RESPIRATORY PARAMETERS The overall AHI was 39.8 per hour, with a central apnea index of 0.0 per hour. The oxygen nadir was 83% during sleep.  CARDIAC DATA Mean heart rate during sleep was 76.9 bpm.  IMPRESSIONS - Severe obstructive sleep apnea occurred during this study (AHI = 39.8/h). - No significant central sleep apnea occurred during this study (CAI = 0.0/h). - Moderate oxygen desaturation was noted during this study (Min O2 = 83%). - Patient snored.  DIAGNOSIS - Obstructive Sleep Apnea (327.23 [G47.33 ICD-10])  RECOMMENDATIONS - Suggest CPAP titration sleep study or DME autopap. Consultation or other interventions would be based on clinical judgment. - Be careful with alcohol, sedatives and other CNS depressants that may worsen sleep apnea and disrupt normal sleep architecture. - Sleep hygiene should be reviewed to assess factors that may improve sleep quality. - Weight management and regular exercise should be initiated or continued.  [Electronically signed] 12/17/2017 11:58 AM  Baird Lyons MD,  ABSM Diplomate, American Board of Sleep Medicine   NPI: [id number]                          [place]  ELECTRONICALLY SIGNED ON:  12/17/2017, 11:56 AM [place] PH: [phone]   FX: [phone] [place]

## 2017-12-26 ENCOUNTER — Ambulatory Visit (INDEPENDENT_AMBULATORY_CARE_PROVIDER_SITE_OTHER): Payer: BLUE CROSS/BLUE SHIELD | Admitting: Family Medicine

## 2017-12-26 VITALS — BP 122/80 | HR 84 | Temp 97.7°F | Wt 190.2 lb

## 2017-12-26 DIAGNOSIS — E669 Obesity, unspecified: Secondary | ICD-10-CM | POA: Diagnosis not present

## 2017-12-26 DIAGNOSIS — G4733 Obstructive sleep apnea (adult) (pediatric): Secondary | ICD-10-CM | POA: Diagnosis not present

## 2017-12-26 NOTE — Patient Instructions (Signed)

## 2017-12-26 NOTE — Progress Notes (Signed)
   Subjective:    Patient ID: Kayla Sexton, female    DOB: [birth year], 37 y.o.   MRN: [id number]  HPI She is here for consult concerning recent diagnosis of sleep apnea.   Review of Systems     Objective:   Physical Exam Alert and in no distress otherwise not examined       Assessment & Plan:  Obesity (BMI 30-39.9)  OSA (obstructive sleep apnea) I discussed the diagnosis of sleep apnea in regard to energy, stamina, falling asleep.  I will write for CPAP with auto titrate 5-15.  We will then readjust basically according to that.  Also discussed weight loss as well as proper sleep hygiene with her.  Recheck here December 30.

## 2017-12-30 ENCOUNTER — Telehealth: Payer: Self-pay | Admitting: Family Medicine

## 2017-12-30 NOTE — Telephone Encounter (Signed)
Pt wanted to let you know that she is still having trouble getting her CPAP she Is having trouble with her insurance  States she may end up paying out of pocket for it, pt can be reached at [phone]

## 2018-01-03 NOTE — Telephone Encounter (Signed)
Dicie Beam, Sabrina A, [place]        Tiffany spoke with the patient and discussed financials. Patient had met her deductible with her current insurance but will switch to [place] come January and wanted to wait until her insurance switched or purchase the CPAP online. The patient wanted [place] to fit her so that she can purchase the equipment online and we can't do that when she isn't a current patient of ours. The website is [email or web address].   Previous Messages    ----- Message -----  From: Koren Bound, CMA  Sent: 01/03/2018 11:03 AM EST  To: Mauri [place]  Subject: status update                   I would like to know the status of the CPAP for pt. Pt states she is not able to get fitted and not sure why. Please advise. If her insurance will not over it., can you provide me with a website that pt can go and buy a cpap machine.    Gabriel Cirri

## 2018-01-03 NOTE — Telephone Encounter (Signed)
Pt was advised with what [employer] had to say about CPAP. Pt can not afford $123.00 a month to rent it out for 6 months as a trial and error, then in January she will have new insurance and have a $6,000 deductible and so it will probably be even higher if insurance doesn't pay until its met. Please advise what the next step is. Pt is every frustrated about this.

## 2018-01-04 NOTE — Telephone Encounter (Signed)
Sent message to [place]. [place]

## 2018-01-04 NOTE — Telephone Encounter (Signed)
I really do not have any other options.  See if [employer] can come up with a solution

## 2018-01-05 ENCOUNTER — Telehealth: Payer: Self-pay

## 2018-01-05 NOTE — Telephone Encounter (Signed)
Called pt to advise her that [facility] will be sending out some paper work for her to help with the cpap cost. lvm for pt to call back. [place]

## 2018-01-12 ENCOUNTER — Telehealth: Payer: Self-pay | Admitting: Family Medicine

## 2018-01-12 ENCOUNTER — Telehealth: Payer: Self-pay

## 2018-01-12 NOTE — Telephone Encounter (Signed)
Spoke to Selma from [facility]. He has advised me that pt was called by Tiffany form the company and was advised that she would still be responsible for the deductible of her insurance company. The hardship info would or could help with the rental of the machine. Pt told Tiffany that she was going to buy the machine out right offline. Per Marylin Crosby advised pt case would be closed and to let them know if she changed her mind. Pt called office that she has not spoke or heard from any one in almost a month. LVM for to call back. [place]

## 2018-01-12 NOTE — Telephone Encounter (Signed)
Pt called upset that the CPAP had been cancelled.  I explained from my understanding that she had told Tiffany at [facility] that she was going to buy the CPAP offline.  And she began telling me how stupid and how.....Marland KitchenMarland KitchenTiffany was.  I stopped her and said that I did not agree to listen to that it sounds like there is way too much information being given.  To simplify the conversation.  I went over with pt 3 times, repeating, you do want to be fitted for the cpap and supplies and you do want to rent the machine, she started up again about the cost and what she might can do and so.  I explained that is what is causing the confusion, she stated that she was not confused.  Again I stated you do want to be fitted for the cpap machine, supplies and rent the machine and she said yes.  I called Tiffany at [facility], she said the patient had cancelled the order, pt told Tiffany to cancel the order.  I explained to Tiffany that I understood clearly the confusion and let's keep it simple.  The pt wants to be fitted, pt wants to supplies and wants to rent the machine before end of year.  Tiffany said she would call pt today or tomorrow and she would also restart the [place] process with [employer].    I called pt back reached voice mail (pt had advised me verbally I can leave message)  I advised her of the next step and that Jonelle Sidle would call her today or Friday.  For pt to call Tiffany if not heard by Friday afternoon.  I also advised I would be out of office to speak with Maudie Mercury if needed.

## 2018-01-13 NOTE — Telephone Encounter (Signed)
Called Kayla Sexton back at [facility] and advised him that pt would like to proceed with the Cpap machine. Kayla stated they still have to call pt to get verbal conformation to reopen order. He says someone will call her today or Monday. [place]

## 2018-03-08 ENCOUNTER — Ambulatory Visit: Payer: Managed Care, Other (non HMO) | Admitting: Family Medicine

## 2018-03-08 ENCOUNTER — Encounter: Payer: Self-pay | Admitting: Family Medicine

## 2018-03-08 VITALS — BP 128/82 | HR 87 | Temp 98.0°F | Wt 192.6 lb

## 2018-03-08 DIAGNOSIS — T783XXA Angioneurotic edema, initial encounter: Secondary | ICD-10-CM

## 2018-03-08 DIAGNOSIS — R319 Hematuria, unspecified: Secondary | ICD-10-CM

## 2018-03-08 LAB — POCT URINALYSIS DIP (PROADVANTAGE DEVICE)
BILIRUBIN UA: NEGATIVE
BILIRUBIN UA: NEGATIVE mg/dL
GLUCOSE UA: NEGATIVE mg/dL
NITRITE UA: NEGATIVE
SPECIFIC GRAVITY, URINE: 1.025
Urobilinogen, Ur: 3.5
pH, UA: 6 (ref 5.0–8.0)

## 2018-03-08 MED ORDER — SULFAMETHOXAZOLE-TRIMETHOPRIM 800-160 MG PO TABS: 1.0000 | ORAL_TABLET | Freq: Two times a day (BID) | ORAL | 0 refills | Status: DC

## 2018-03-08 NOTE — Progress Notes (Signed)
   Subjective:    Patient ID: Kayla Sexton, female    DOB: 1980/12/04, 38 y.o.   MRN: [id number]  HPI She is here for multiple issues.  She has a 1 day history of hematuria but no dysuria, urgency or frequency. She started a new job approximately 1 month ago and states that she has noted a rash that has been intermittent in nature on any part of her body that she describes as red and slightly blistering that this is itches and burns.  She is also noted intermittent swelling and did show me pictures of her eyes and lips it did swell.  She also has had trouble with shortness of breath, cough, heartburn, headache as well as some anorexia.  The anorexia is new today.  No history of tick bites.  She does eat red meat.  She thinks that this could be related to her work environment that she has been out for approximately 1 month.  She is on no new medications.   Review of Systems     Objective:   Physical Exam Alert and in no distress. Tympanic membranes and canals are normal. Pharyngeal area is normal. Neck is supple without adenopathy or thyromegaly. Cardiac exam shows a regular sinus rhythm without murmurs or gallops. Lungs are clear to auscultation. Slightly erythematous rash is noted on the right distal forearm.  No edema is noted.  She did take pictures and did show evidence of eyelid edema as well as lower lip edema.      Assessment & Plan:  Hematuria, unspecified type - Plan: POCT Urinalysis DIP (Proadvantage Device), sulfamethoxazole-trimethoprim (BACTRIM DS,SEPTRA DS) 800-160 MG tablet  Angioedema, initial encounter - Plan: Alpha-Gal Panel, CBC with Differential/Platelet, Comprehensive metabolic panel She is to return here in 2 weeks for recheck on her urine. Her other symptoms could possibly be alpha gal.  If this is not the case then, I will refer to an allergist for further evaluation.

## 2018-03-10 ENCOUNTER — Other Ambulatory Visit: Payer: Self-pay | Admitting: Medical

## 2018-03-10 ENCOUNTER — Telehealth: Payer: Self-pay | Admitting: Internal Medicine

## 2018-03-10 ENCOUNTER — Telehealth: Payer: Self-pay | Admitting: Medical

## 2018-03-10 MED ORDER — PHENAZOPYRIDINE HCL 200 MG PO TABS: 200.0000 mg | ORAL_TABLET | Freq: Three times a day (TID) | ORAL | 0 refills | Status: DC | PRN

## 2018-03-10 MED ORDER — FLUCONAZOLE 100 MG PO TABS: ORAL_TABLET | ORAL | 0 refills | Status: DC

## 2018-03-10 MED ORDER — NITROFURANTOIN MONOHYD MACRO 100 MG PO CAPS: 100.0000 mg | ORAL_CAPSULE | Freq: Two times a day (BID) | ORAL | 0 refills | Status: DC

## 2018-03-10 NOTE — Telephone Encounter (Signed)
Pt called & asked for Diflucan for yeast infection due to taking 2 different antibiotics

## 2018-03-10 NOTE — Telephone Encounter (Signed)
Pt states she does have history of UTI's, had kidney stones in June & blockage and surgery.  No vaginal concerns, wants something for pain.  States is worse now than when started on antibiotic.  Discussed with Audelia Acton and he gave her another antibiotic that she is to take both antibiotics and Pyridium for pain and if no improvement by Monday call to be seen

## 2018-03-10 NOTE — Telephone Encounter (Signed)
Pt called and states that she has been on antibiotics for 2 days for bladder infection but is hurting more nore than she did when she was seen. Please advise.

## 2018-03-10 NOTE — Telephone Encounter (Signed)
The urinalysis shows bacteria and blood, suspicious for urinary tract infection. I don't see a culture ordered. Bactrim is a good antibiotic most of the time for UTI.    It may take a few days to see improvement.    Does she have vaginal concerns, any concerns for STD?     Does she have history of getting UTIs often?

## 2018-03-10 NOTE — Telephone Encounter (Signed)
Left message for pt to call me back 

## 2018-03-13 ENCOUNTER — Emergency Department
Admission: EM | Admit: 2018-03-13 | Discharge: 2018-03-13 | Disposition: A | Discharge status: Home / Self Care | Payer: Managed Care, Other (non HMO) | Attending: Emergency Medicine | Admitting: Emergency Medicine

## 2018-03-13 ENCOUNTER — Telehealth: Payer: Self-pay | Admitting: Medical

## 2018-03-13 ENCOUNTER — Emergency Department: Payer: Managed Care, Other (non HMO)

## 2018-03-13 ENCOUNTER — Other Ambulatory Visit: Payer: Self-pay

## 2018-03-13 DIAGNOSIS — R109 Unspecified abdominal pain: Secondary | ICD-10-CM | POA: Diagnosis present

## 2018-03-13 DIAGNOSIS — Z79899 Other long term (current) drug therapy: Secondary | ICD-10-CM | POA: Insufficient documentation

## 2018-03-13 DIAGNOSIS — Z3202 Encounter for pregnancy test, result negative: Secondary | ICD-10-CM | POA: Insufficient documentation

## 2018-03-13 DIAGNOSIS — N200 Calculus of kidney: Secondary | ICD-10-CM | POA: Insufficient documentation

## 2018-03-13 DIAGNOSIS — N23 Unspecified renal colic: Secondary | ICD-10-CM

## 2018-03-13 LAB — CBC WITH DIFFERENTIAL/PLATELET
ABS IMMATURE GRANULOCYTES: 0.04 10*3/uL (ref 0.00–0.07)
BASOS ABS: 0.1 10*3/uL (ref 0.0–0.2)
BASOS PCT: 0 %
Basophils Absolute: 0 10*3/uL (ref 0.0–0.1)
Basos: 1 %
EOS (ABSOLUTE): 0.2 10*3/uL (ref 0.0–0.4)
Eos: 2 %
Eosinophils Absolute: 0 10*3/uL (ref 0.0–0.5)
Eosinophils Relative: 0 %
HCT: 45.1 % (ref 36.0–46.0)
HEMOGLOBIN: 14.2 g/dL (ref 11.1–15.9)
Hematocrit: 42.6 % (ref 34.0–46.6)
Hemoglobin: 14.3 g/dL (ref 12.0–15.0)
Immature Grans (Abs): 0 10*3/uL (ref 0.0–0.1)
Immature Granulocytes: 0 %
Immature Granulocytes: 0 %
Lymphocytes Absolute: 4.6 10*3/uL — ABNORMAL HIGH (ref 0.7–3.1)
Lymphocytes Relative: 16 %
Lymphs Abs: 2.1 10*3/uL (ref 0.7–4.0)
Lymphs: 43 %
MCH: 27.4 pg (ref 26.6–33.0)
MCH: 27.6 pg (ref 26.0–34.0)
MCHC: 33.3 g/dL (ref 31.5–35.7)
MCHC: 31.7 g/dL (ref 30.0–36.0)
MCV: 82 fL (ref 79–97)
MCV: 86.9 fL (ref 80.0–100.0)
MONOCYTES: 10 %
MONOS ABS: 1.1 10*3/uL — AB (ref 0.1–0.9)
Monocytes Absolute: 1 10*3/uL (ref 0.1–1.0)
Monocytes Relative: 7 %
NEUTROS ABS: 10 10*3/uL — AB (ref 1.7–7.7)
Neutrophils Absolute: 4.8 10*3/uL (ref 1.4–7.0)
Neutrophils Relative %: 77 %
Neutrophils: 44 %
PLATELETS: 368 10*3/uL (ref 150–450)
PLATELETS: 334 10*3/uL (ref 150–400)
RBC: 5.19 x10E6/uL (ref 3.77–5.28)
RBC: 5.19 MIL/uL — ABNORMAL HIGH (ref 3.87–5.11)
RDW: 13.4 % (ref 11.7–15.4)
RDW: 13.8 % (ref 11.5–15.5)
WBC: 10.8 10*3/uL (ref 3.4–10.8)
WBC: 13.3 10*3/uL — ABNORMAL HIGH (ref 4.0–10.5)
nRBC: 0 % (ref 0.0–0.2)

## 2018-03-13 LAB — COMPREHENSIVE METABOLIC PANEL
ALBUMIN: 3.9 g/dL (ref 3.5–5.0)
ALK PHOS: 97 IU/L (ref 39–117)
ALT: 16 IU/L (ref 0–32)
ALT: 20 U/L (ref 0–44)
AST: 18 IU/L (ref 0–40)
AST: 25 U/L (ref 15–41)
Albumin/Globulin Ratio: 1.5 (ref 1.2–2.2)
Albumin: 4.5 g/dL (ref 3.8–4.8)
Alkaline Phosphatase: 78 U/L (ref 38–126)
Anion gap: 8 (ref 5–15)
BILIRUBIN TOTAL: 0.7 mg/dL (ref 0.0–1.2)
BUN/Creatinine Ratio: 14 (ref 9–23)
BUN: 11 mg/dL (ref 6–20)
BUN: 11 mg/dL (ref 6–20)
CHLORIDE: 102 mmol/L (ref 96–106)
CO2: 23 mmol/L (ref 20–29)
CO2: 24 mmol/L (ref 22–32)
CREATININE: 0.79 mg/dL (ref 0.57–1.00)
Calcium: 9.3 mg/dL (ref 8.7–10.2)
Calcium: 8.4 mg/dL — ABNORMAL LOW (ref 8.9–10.3)
Chloride: 108 mmol/L (ref 98–111)
Creatinine, Ser: 1.07 mg/dL — ABNORMAL HIGH (ref 0.44–1.00)
GFR calc Af Amer: 111 mL/min/{1.73_m2} (ref >59)
GFR calc Af Amer: >60 mL/min (ref >60)
GFR calc non Af Amer: 96 mL/min/{1.73_m2} (ref >59)
GFR calc non Af Amer: >60 mL/min (ref >60)
GLUCOSE: 85 mg/dL (ref 65–99)
GLUCOSE: 103 mg/dL — AB (ref 70–99)
Globulin, Total: 3.1 g/dL (ref 1.5–4.5)
Potassium: 3.8 mmol/L (ref 3.5–5.2)
Potassium: 4 mmol/L (ref 3.5–5.1)
Sodium: 142 mmol/L (ref 134–144)
Sodium: 140 mmol/L (ref 135–145)
Total Bilirubin: 0.7 mg/dL (ref 0.3–1.2)
Total Protein: 7.6 g/dL (ref 6.0–8.5)
Total Protein: 7.5 g/dL (ref 6.5–8.1)

## 2018-03-13 LAB — POCT PREGNANCY, URINE: Preg Test, Ur: NEGATIVE

## 2018-03-13 LAB — ALPHA-GAL PANEL
ALPHA GAL IGE: 0.32 kU/L — AB (ref <0.10)
Beef (Bos spp) IgE: 0.14 kU/L (ref <0.35)
LAMB CLASS INTERPRETATION: 0
Lamb/Mutton (Ovis spp) IgE: <0.1 kU/L (ref <0.35)
PORK CLASS INTERPRETATION: 0
Pork (Sus spp) IgE: <0.1 kU/L (ref <0.35)

## 2018-03-13 LAB — URINALYSIS, ROUTINE W REFLEX MICROSCOPIC
Bilirubin Urine: NEGATIVE
Glucose, UA: NEGATIVE mg/dL
KETONES UR: NEGATIVE mg/dL
Nitrite: NEGATIVE
PROTEIN: 30 mg/dL — AB
Specific Gravity, Urine: 1.018 (ref 1.005–1.030)
pH: 5 (ref 5.0–8.0)

## 2018-03-13 LAB — LIPASE, BLOOD: Lipase: 23 U/L (ref 11–51)

## 2018-03-13 MED ORDER — HYDROMORPHONE HCL 1 MG/ML IJ SOLN
0.5000 mg | Freq: Once | INTRAMUSCULAR | Status: AC
  Administered 2018-03-13: 0.5 mg via INTRAVENOUS

## 2018-03-13 MED ORDER — ONDANSETRON HCL 4 MG PO TABS: 4.0000 mg | ORAL_TABLET | Freq: Every day | ORAL | 1 refills | Status: DC | PRN

## 2018-03-13 MED ORDER — KETOROLAC TROMETHAMINE 30 MG/ML IJ SOLN
30.0000 mg | Freq: Once | INTRAMUSCULAR | Status: AC
  Administered 2018-03-13: 30 mg via INTRAVENOUS
  Filled 2018-03-13: qty 1

## 2018-03-13 MED ORDER — OXYCODONE-ACETAMINOPHEN 7.5-325 MG PO TABS: 1.0000 | ORAL_TABLET | ORAL | 0 refills | Status: DC | PRN

## 2018-03-13 MED ORDER — ONDANSETRON HCL 4 MG/2ML IJ SOLN
4.0000 mg | Freq: Once | INTRAMUSCULAR | Status: AC
  Administered 2018-03-13: 4 mg via INTRAVENOUS
  Filled 2018-03-13: qty 2

## 2018-03-13 MED ORDER — SODIUM CHLORIDE 0.9 % IV BOLUS
1000.0000 mL | Freq: Once | INTRAVENOUS | Status: AC
  Administered 2018-03-13: 1000 mL via INTRAVENOUS

## 2018-03-13 MED ORDER — KETOROLAC TROMETHAMINE 10 MG PO TABS: 10.0000 mg | ORAL_TABLET | Freq: Four times a day (QID) | ORAL | 0 refills | Status: DC | PRN

## 2018-03-13 MED ORDER — TAMSULOSIN HCL 0.4 MG PO CAPS: 0.4000 mg | ORAL_CAPSULE | Freq: Every day | ORAL | 0 refills | Status: DC

## 2018-03-13 MED ORDER — HYDROMORPHONE HCL 1 MG/ML IJ SOLN
INTRAMUSCULAR | Status: AC
  Administered 2018-03-13: 0.5 mg via INTRAVENOUS
  Filled 2018-03-13: qty 1

## 2018-03-13 MED ORDER — ONDANSETRON HCL 4 MG/2ML IJ SOLN
INTRAMUSCULAR | Status: AC
  Filled 2018-03-13: qty 2

## 2018-03-13 MED ORDER — ONDANSETRON HCL 4 MG/2ML IJ SOLN
4.0000 mg | Freq: Once | INTRAMUSCULAR | Status: AC
  Administered 2018-03-13: 4 mg via INTRAVENOUS

## 2018-03-13 MED ORDER — HYDROMORPHONE HCL 1 MG/ML IJ SOLN
INTRAMUSCULAR | Status: AC
  Filled 2018-03-13: qty 1

## 2018-03-13 NOTE — ED Triage Notes (Signed)
Back pain and abd pain. Hx of kidney stones, states this feels the same. Pt tearful in triage.

## 2018-03-13 NOTE — ED Provider Notes (Signed)
[facility] Emergency Department Provider Note  ____________________________________________   None    (approximate)  I have reviewed the triage vital signs and the nursing notes.   HISTORY  Chief Complaint Abdominal Pain    HPI Kayla Sexton is a 38 y.o. female patient reports left CVA pain radiating around toward the front and into her groin starting last night.  Pain is severe feels like her last kidney stone.  Not having a nausea or vomiting.  She is having frequent urination small amounts.  Patient had stones in June last year requiring stenting..  Nothing makes his pain better or worse.   Past Medical History:  Diagnosis Date  . CIN III (cervical intraepithelial neoplasia III)   . Dysuria   . GERD (gastroesophageal reflux disease)   . Hematuria   . HH (hiatus hernia)   . History of cervical dysplasia 2006   CIN1 and CIN2  . History of kidney stones   . Migraines   . PCOS (polycystic ovarian syndrome)    Dr. Helane Rima  . PONV (postoperative nausea and vomiting)   . Renal calculus, left   . Right ureteral calculus     Patient Active Problem List   Diagnosis Date Noted  . OSA (obstructive sleep apnea) 12/26/2017  . Obesity (BMI 30-39.9) 11/21/2017  . ADD (attention deficit disorder) 03/17/2011    Past Surgical History:  Procedure Laterality Date  . CERVICAL BIOPSY  W/ LOOP ELECTRODE EXCISION  12-04-2004   dr Helane Rima  [facility]  . COLONOSCOPY  03/2008   Dr. Michail Sermon  . COMBINED HYSTEROSCOPY DIAGNOSTIC / D&C  02-28-2012   dr Helane Rima   @ [facility]  . CYSTOSCOPY WITH RETROGRADE PYELOGRAM, URETEROSCOPY AND STENT PLACEMENT Bilateral 07/21/2017   Procedure: CYSTOSCOPY WITH BILATERAL  RETROGRADE BILATERAL URETEROSCOPY AND STENTS  PLACEMENT;  Surgeon: Irine Seal, MD;  Location: [facility];  Service: Urology;  Laterality: Bilateral;  . endoscopy  2010   Dr. Amedeo Plenty  . FOOT SURGERY Left 2015   5th bunionectomy w/ 2nd digit w/ fusion , retained  hardware w/ 5th toe  . HOLMIUM LASER APPLICATION Bilateral 5/69/7948   Procedure: HOLMIUM LASER APPLICATION;  Surgeon: Irine Seal, MD;  Location: [facility];  Service: Urology;  Laterality: Bilateral;    Prior to Admission medications   Medication Sig Start Date End Date Taking? Authorizing Provider  Aspirin-Acetaminophen-Caffeine (EXCEDRIN MIGRAINE PO) Take by mouth as needed.    [provider]  cephALEXin (KEFLEX) 500 MG capsule Take 1 capsule (500 mg total) by mouth 3 (three) times daily. Patient not taking: Reported on 12/26/2017 07/25/17   Larene Pickett, PA-C  diphenhydrAMINE (BENADRYL) 25 MG tablet Take 25 mg by mouth every 6 (six) hours as needed.    [provider]  fluconazole (DIFLUCAN) 100 MG tablet 1 tablet now, repeat in 1 week 03/10/18   Tysinger, Camelia Eng, PA-C  HYDROcodone-acetaminophen (NORCO/VICODIN) 5-325 MG tablet Take 1 tablet by mouth every 6 (six) hours as needed for moderate pain. Patient not taking: Reported on 12/26/2017 07/21/17   Irine Seal, MD  ibuprofen (ADVIL,MOTRIN) 200 MG tablet Take 200 mg by mouth every 6 (six) hours as needed.    [provider]  ketorolac (TORADOL) 10 MG tablet Take 1 tablet (10 mg total) by mouth every 6 (six) hours as needed. 03/13/18   Earleen Newport, MD  nitrofurantoin, macrocrystal-monohydrate, (MACROBID) 100 MG capsule Take 1 capsule (100 mg total) by mouth 2 (two) times daily  for 7 days. 03/10/18 03/17/18  Tysinger, Camelia Eng, PA-C  ondansetron (ZOFRAN ODT) 4 MG disintegrating tablet Take 1 tablet (4 mg total) by mouth every 8 (eight) hours as needed for nausea. Patient not taking: Reported on 12/26/2017 07/25/17   Larene Pickett, PA-C  ondansetron (ZOFRAN) 4 MG tablet Take 1 tablet (4 mg total) by mouth daily as needed for nausea or vomiting. 03/13/18   Earleen Newport, MD  oxyCODONE-acetaminophen (PERCOCET) 5-325 MG tablet Take 1 tablet by mouth every 4 (four) hours as needed. Patient not  taking: Reported on 12/26/2017 07/25/17   Larene Pickett, PA-C  oxyCODONE-acetaminophen (PERCOCET) 7.5-325 MG tablet Take 1 tablet by mouth every 4 (four) hours as needed for severe pain. 03/13/18 03/13/19  Earleen Newport, MD  phenazopyridine (PYRIDIUM) 200 MG tablet Take 1 tablet (200 mg total) by mouth 3 (three) times daily as needed for pain. 03/10/18   Tysinger, Camelia Eng, PA-C  potassium citrate (UROCIT-K) 10 MEQ (1080 MG) SR tablet Take 10 mEq by mouth 3 (three) times daily with meals.    [provider]  ranitidine (ZANTAC) 150 MG tablet Take 150 mg by mouth as needed for heartburn.    [provider]  sulfamethoxazole-trimethoprim (BACTRIM DS,SEPTRA DS) 800-160 MG tablet Take 1 tablet by mouth 2 (two) times daily. 03/08/18   Denita Lung, MD  tamsulosin (FLOMAX) 0.4 MG CAPS capsule Take 1 capsule (0.4 mg total) by mouth daily. 07/21/17   Irine Seal, MD  tamsulosin [facility]) 0.4 MG CAPS capsule Take 1 capsule (0.4 mg total) by mouth daily after breakfast. 03/13/18   Earleen Newport, MD    Allergies Adhesive [tape] and Vancomycin  No family history on file.  Social History Social History   Tobacco Use  . Smoking status: Never Smoker  . Smokeless tobacco: Never Used  Substance Use Topics  . Alcohol use: No  . Drug use: No    Review of Systems  Constitutional: No fever/chills Eyes: No visual changes. ENT: No sore throat. Cardiovascular: Denies chest pain. Respiratory: Denies shortness of breath. Gastrointestinal:  abdominal pain.  No nausea, no vomiting.  No diarrhea.  No constipation. Genitourinary: Negative for dysuria! Musculoskeletal: Negative for back pain. Skin: Negative for rash. Neurological: Negative for headaches, focal weakness or   ____________________________________________   PHYSICAL EXAM:  VITAL SIGNS: ED Triage Vitals  Enc Vitals Group     BP 03/13/18 0935 (!) 145/93     Pulse Rate 03/13/18 0935 (!) 113     Resp 03/13/18 0935  16     Temp 03/13/18 0935 98 F (36.7 C)     Temp Source 03/13/18 0935 Oral     SpO2 03/13/18 0935 97 %     Weight 03/13/18 0936 192 lb (87.1 kg)     Height 03/13/18 0936 5\' 2"  (1.575 m)     Head Circumference --      Peak Flow --      Pain Score 03/13/18 0936 10     Pain Loc --      Pain Edu? --      Excl. in [place]? --     Constitutional: Alert and oriented.  Looks uncomfortable Eyes: Conjunctivae are normal.  Head: Atraumatic. Nose: No congestion/rhinnorhea. Mouth/Throat: Mucous membranes are moist.  Oropharynx non-erythematous. Neck: No stridor.  Cardiovascular: Normal rate, regular rhythm. Grossly normal heart sounds.  Good peripheral circulation. Respiratory: Normal respiratory effort.  No retractions. Lungs CTAB. Gastrointestinal: Soft some left lower quadrant pain on  palpation. No distention. No abdominal bruits.  Left CVA tenderness. Musculoskeletal: No lower extremity tenderness  Neurologic:  Normal speech and language. No gross focal neurologic deficits are appreciated. No gait instability. Skin:  Skin is warm, dry and intact. No rash noted. Psychiatric: Mood and affect are normal. Speech and behavior are normal.  ____________________________________________   LABS (all labs ordered are listed, but only abnormal results are displayed)  Labs Reviewed  COMPREHENSIVE METABOLIC PANEL - Abnormal; Notable for the following components:      Result Value   Glucose, Bld 103 (*)    Creatinine, Ser 1.07 (*)    Calcium 8.4 (*)    All other components within normal limits  CBC WITH DIFFERENTIAL/PLATELET - Abnormal; Notable for the following components:   WBC 13.3 (*)    RBC 5.19 (*)    Neutro Abs 10.0 (*)    All other components within normal limits  URINALYSIS, ROUTINE W REFLEX MICROSCOPIC - Abnormal; Notable for the following components:   Color, Urine YELLOW (*)    APPearance CLEAR (*)    Hgb urine dipstick MODERATE (*)    Protein, ur 30 (*)    Leukocytes,Ua MODERATE  (*)    Bacteria, UA RARE (*)    All other components within normal limits  URINE CULTURE  LIPASE, BLOOD  POC URINE PREG, ED  POCT PREGNANCY, URINE   ____________________________________________  EKG   ____________________________________________  RADIOLOGY  ED MD interpretation: CT shows renal stones  Official radiology report(s): Ct Renal Stone Study  Result Date: 03/13/2018 CLINICAL DATA:  Left-sided flank pain beginning today. History of renal stone disease. EXAM: CT ABDOMEN AND PELVIS WITHOUT CONTRAST TECHNIQUE: Multidetector CT imaging of the abdomen and pelvis was performed following the standard protocol without IV contrast. COMPARISON:  07/18/2017 FINDINGS: Lower chest: Normal Hepatobiliary: Normal Pancreas: Normal Spleen: Normal Adrenals/Urinary Tract: Adrenal glands are normal. Right kidney contains a nonobstructing 8 mm stone in the lower pole. No passing stone on the right. The left kidney is swollen with hydroureteronephrosis. 6 mm stone in the extrarenal pelvis. The left ureter is dilated to the pelvic brim, where there is a 3 x 4 mm stone. No stone distal to that. Phleboliths are present in pelvis. No stone in the bladder. Stomach/Bowel: Normal Vascular/Lymphatic: Normal Reproductive: Normal Other: No free fluid or air. Musculoskeletal: Normal IMPRESSION: Hydroureteronephrosis on the left. 3 x 4 mm stone in the left ureter at the pelvic brim responsible for the obstruction. 6 mm stone in the extrarenal pelvis on the left. 8 mm nonobstructing stone in the lower pole of the right kidney. Electronically Signed   By: Nelson Chimes M.D.   On: 03/13/2018 11:00    ____________________________________________   PROCEDURES  Procedure(s) performed:   Procedures  Critical Care performed:   ____________________________________________   INITIAL IMPRESSION / ASSESSMENT AND PLAN / ED COURSE     I had seen patient in [place] and ordered a CT scan when I got the report back  patient had been moved to see Fraser Din and Dr. Jimmye Norman had finished seeing the patient.      ____________________________________________   FINAL CLINICAL IMPRESSION(S) / ED DIAGNOSES  Final diagnoses:  Kidney stone  Renal colic     ED Discharge Orders         Ordered    ketorolac (TORADOL) 10 MG tablet  Every 6 hours PRN,   Status:  Discontinued     03/13/18 1402    oxyCODONE-acetaminophen (PERCOCET) 7.5-325 MG tablet  Every  4 hours PRN     03/13/18 1402    ondansetron (ZOFRAN) 4 MG tablet  Daily PRN,   Status:  Discontinued     03/13/18 1402    tamsulosin (FLOMAX) 0.4 MG CAPS capsule  Daily after breakfast,   Status:  Discontinued     03/13/18 1402    ketorolac (TORADOL) 10 MG tablet  Every 6 hours PRN     03/13/18 1434    ondansetron (ZOFRAN) 4 MG tablet  Daily PRN     03/13/18 1434    tamsulosin (FLOMAX) 0.4 MG CAPS capsule  Daily after breakfast     03/13/18 1434           Note:  This document was prepared using Dragon voice recognition software and may include unintentional dictation errors.    Nena Polio, MD 03/13/18 1755

## 2018-03-13 NOTE — ED Provider Notes (Signed)
[facility] Emergency Department Provider Note       Time seen: ----------------------------------------- 11:22 AM on 03/13/2018 -----------------------------------------   I have reviewed the triage vital signs and the nursing notes.  HISTORY   Chief Complaint Abdominal Pain    HPI Kayla Sexton is a 38 y.o. female with a history of dysuria, GERD, cervical dysplasia, kidney stones who presents to the ED for back pain and abdominal pain.  Patient reports a history of kidney stones and reports this feels similarly.  She had tried hydrocodone for pain without any improvement.  She reports stent placement for kidney stone last year.  Past Medical History:  Diagnosis Date  . CIN III (cervical intraepithelial neoplasia III)   . Dysuria   . GERD (gastroesophageal reflux disease)   . Hematuria   . HH (hiatus hernia)   . History of cervical dysplasia 2006   CIN1 and CIN2  . History of kidney stones   . Migraines   . PCOS (polycystic ovarian syndrome)    Dr. Helane Rima  . PONV (postoperative nausea and vomiting)   . Renal calculus, left   . Right ureteral calculus     Patient Active Problem List   Diagnosis Date Noted  . OSA (obstructive sleep apnea) 12/26/2017  . Obesity (BMI 30-39.9) 11/21/2017  . ADD (attention deficit disorder) 03/17/2011    Past Surgical History:  Procedure Laterality Date  . CERVICAL BIOPSY  W/ LOOP ELECTRODE EXCISION  12-04-2004   dr Helane Rima  [facility]  . COLONOSCOPY  03/2008   Dr. Michail Sermon  . COMBINED HYSTEROSCOPY DIAGNOSTIC / D&C  02-28-2012   dr Helane Rima   @ [facility]  . CYSTOSCOPY WITH RETROGRADE PYELOGRAM, URETEROSCOPY AND STENT PLACEMENT Bilateral 07/21/2017   Procedure: CYSTOSCOPY WITH BILATERAL  RETROGRADE BILATERAL URETEROSCOPY AND STENTS  PLACEMENT;  Surgeon: Irine Seal, MD;  Location: [facility];  Service: Urology;  Laterality: Bilateral;  . endoscopy  2010   Dr. Amedeo Plenty  . FOOT SURGERY Left 2015   5th bunionectomy  w/ 2nd digit w/ fusion , retained hardware w/ 5th toe  . HOLMIUM LASER APPLICATION Bilateral 07/01/3014   Procedure: HOLMIUM LASER APPLICATION;  Surgeon: Irine Seal, MD;  Location: [facility];  Service: Urology;  Laterality: Bilateral;    Allergies Adhesive [tape] and Vancomycin  Social History Social History   Tobacco Use  . Smoking status: Never Smoker  . Smokeless tobacco: Never Used  Substance Use Topics  . Alcohol use: No  . Drug use: No   Review of Systems Constitutional: Negative for fever. Cardiovascular: Negative for chest pain. Respiratory: Negative for shortness of breath. Gastrointestinal: Positive for abdominal pain and back pain Musculoskeletal: Positive for back pain Skin: Negative for rash. Neurological: Negative for headaches, focal weakness or numbness.  All systems negative/normal/unremarkable except as stated in the HPI  ____________________________________________   PHYSICAL EXAM:  VITAL SIGNS: ED Triage Vitals  Enc Vitals Group     BP 03/13/18 0935 (!) 145/93     Pulse Rate 03/13/18 0935 (!) 113     Resp 03/13/18 0935 16     Temp 03/13/18 0935 98 F (36.7 C)     Temp Source 03/13/18 0935 Oral     SpO2 03/13/18 0935 97 %     Weight 03/13/18 0936 192 lb (87.1 kg)     Height 03/13/18 0936 5\' 2"  (1.575 m)     Head Circumference --      Peak Flow --  Pain Score 03/13/18 0936 10     Pain Loc --      Pain Edu? --      Excl. in [place]? --    Constitutional: Alert and oriented. Well appearing and in no distress. Eyes: Conjunctivae are normal. Normal extraocular movements. Cardiovascular: Normal rate, regular rhythm. No murmurs, rubs, or gallops. Respiratory: Normal respiratory effort without tachypnea nor retractions. Breath sounds are clear and equal bilaterally. No wheezes/rales/rhonchi. Gastrointestinal: Soft and nontender. Normal bowel sounds Musculoskeletal: Nontender with normal range of motion in extremities. No lower  extremity tenderness nor edema. Neurologic:  Normal speech and language. No gross focal neurologic deficits are appreciated.  Skin:  Skin is warm, dry and intact. No rash noted. Psychiatric: Mood and affect are normal. Speech and behavior are normal.  ___________________________________________  ED COURSE:  As part of my medical decision making, I reviewed the following data within the [place] History obtained from family if available, nursing notes, old chart and ekg, as well as notes from prior ED visits. Patient presented for flank pain, we will assess with labs and imaging as indicated at this time.   Procedures ____________________________________________   LABS (pertinent positives/negatives)  Labs Reviewed  COMPREHENSIVE METABOLIC PANEL - Abnormal; Notable for the following components:      Result Value   Glucose, Bld 103 (*)    Creatinine, Ser 1.07 (*)    Calcium 8.4 (*)    All other components within normal limits  CBC WITH DIFFERENTIAL/PLATELET - Abnormal; Notable for the following components:   WBC 13.3 (*)    RBC 5.19 (*)    Neutro Abs 10.0 (*)    All other components within normal limits  URINALYSIS, ROUTINE W REFLEX MICROSCOPIC - Abnormal; Notable for the following components:   Color, Urine YELLOW (*)    APPearance CLEAR (*)    Hgb urine dipstick MODERATE (*)    Protein, ur 30 (*)    Leukocytes,Ua MODERATE (*)    Bacteria, UA RARE (*)    All other components within normal limits  URINE CULTURE  LIPASE, BLOOD  POC URINE PREG, ED  POCT PREGNANCY, URINE    RADIOLOGY Images were viewed by me  CT renal protocol IMPRESSION: Hydroureteronephrosis on the left. 3 x 4 mm stone in the left ureter at the pelvic brim responsible for the obstruction. 6 mm stone in the extrarenal pelvis on the left.  8 mm nonobstructing stone in the lower pole of the right kidney. ____________________________________________   DIFFERENTIAL DIAGNOSIS   Renal  colic, UTI, pyelonephritis, gas pain, constipation, muscle strain  FINAL ASSESSMENT AND PLAN  Renal colic   Plan: The patient had presented for abdominal pain and back pain. Patient's labs revealed a borderline urinalysis for infection.  Urine culture was sent.  Patient's imaging revealed a 3 x 4 mm stone at the left pelvic brim.  She had received several doses of Dilaudid prior to my evaluation.  We did give her Toradol as well.  Currently she is feeling better after Toradol, patient was discussed with urology who will provide close outpatient follow-up.   Laurence Aly, MD    Note: This note was generated in part or whole with voice recognition software. Voice recognition is usually quite accurate but there are transcription errors that can and very often do occur. I apologize for any typographical errors that were not detected and corrected.     Earleen Newport, MD 03/13/18 1401

## 2018-03-13 NOTE — Telephone Encounter (Signed)
Pt called states she is hurting so bad, she can no longer urinate, thinks it's thinks a kidney stone again and is afraid it's blocking because she now can't urinate at all, worse pain than before when she had 2 kidney stones, vomiting, she is in [place] and can't drive here, I asked if someone could bring her and she states no, recommend she go to ER.

## 2018-03-13 NOTE — Telephone Encounter (Signed)
Pt called and states she has 3 kidney stones and hospital is taking her out of work and hospital is going to figure out how they are going to remove the stones.  She says that her work is [employer] papers but Dr. Redmond School will have to complete them because he is her primary care doctor.

## 2018-03-13 NOTE — Telephone Encounter (Signed)
If she needs time off from work because the stones, the urologist will have to fill out the paperwork

## 2018-03-14 ENCOUNTER — Other Ambulatory Visit: Payer: Self-pay | Admitting: Radiology

## 2018-03-14 ENCOUNTER — Ambulatory Visit (INDEPENDENT_AMBULATORY_CARE_PROVIDER_SITE_OTHER): Payer: Managed Care, Other (non HMO) | Admitting: Urology

## 2018-03-14 ENCOUNTER — Encounter: Payer: Self-pay | Admitting: Urology

## 2018-03-14 ENCOUNTER — Telehealth: Payer: Self-pay | Admitting: Radiology

## 2018-03-14 VITALS — BP 119/78 | HR 105 | Ht 62.0 in | Wt 195.0 lb

## 2018-03-14 DIAGNOSIS — E860 Dehydration: Secondary | ICD-10-CM

## 2018-03-14 DIAGNOSIS — N200 Calculus of kidney: Secondary | ICD-10-CM | POA: Diagnosis not present

## 2018-03-14 DIAGNOSIS — N201 Calculus of ureter: Secondary | ICD-10-CM

## 2018-03-14 DIAGNOSIS — N1339 Other hydronephrosis: Secondary | ICD-10-CM

## 2018-03-14 LAB — URINE CULTURE
Culture: <10000 — AB
Special Requests: NORMAL

## 2018-03-14 LAB — URINALYSIS, COMPLETE
Bilirubin, UA: NEGATIVE
Glucose, UA: NEGATIVE
KETONES UA: NEGATIVE
Nitrite, UA: NEGATIVE
SPEC GRAV UA: 1.025 (ref 1.005–1.030)
Urobilinogen, Ur: 0.2 mg/dL (ref 0.2–1.0)
pH, UA: 5 (ref 5.0–7.5)

## 2018-03-14 LAB — MICROSCOPIC EXAMINATION

## 2018-03-14 NOTE — Telephone Encounter (Signed)
Patient was given the [facility] Information form below as well as the Instructions for Pre-Admission Testing form & a map of [facility].   [place] [place], [address], [place] [address] Telephone: [phone] Fax: [phone]   Thank you for choosing [facility] for your upcoming surgery!  We are always here to assist in your urological needs.  Please read the following information with specific details for your upcoming appointments related to your surgery. Please contact Kayle Correa at [phone] Option 3 with any questions.  The Name of Your Surgery: Left ureteroscopy, laser lithotripsy, stone removal, ureteral stent placement Your Surgery Date: 03/17/2018 Your Surgeon:  Nickolas Madrid  Please call Same Day Surgery at [phone] between the hours of 1pm-3pm one day prior to your surgery. They will inform you of the time to arrive at Same Day Surgery which is located on the second floor of the [facility].   Please refer to the attached letter regarding instructions for Pre-Admission Testing. You will receive a call from the [place] office regarding your appointment with them.  The Pre-Admission Testing office is located at [place], on the first floor of the [place] [facility] in [place] (office is to the right as you enter through the [employer] of the [facility]). Please have all medications you are currently taking and your insurance card available.   Patient was advised to have nothing to eat or drink after midnight the night prior to surgery except that she may have only water until 2 hours before surgery with nothing to drink within 2 hours of surgery.  The patient states she currently takes no blood thinners. Patient's questions were answered and she expressed understanding of these instructions.

## 2018-03-14 NOTE — Progress Notes (Signed)
03/14/2018 10:57 AM   Kayla Sexton [birth year] [id number]  Referring provider: Denita Lung, MD [address] [place], [place] [address]  Chief Complaint  Patient presents with  . Hydronephrosis    New Patient    HPI: 38 year old female with personal history of nephrolithiasis who presents today for ER follow-up.  She was seen just yesterday in the emergency room with severe acute onset left flank pain.  CT scan revealed a 4 mm stone at the left pelvic brim with hydroureteronephrosis down to the level.  She also has a 6 mm left renal pelvic stone as well as an 8 mm left lower pole stone.  She did have a mildly suspicious urine but had already been on Bactrim as prescribed by her primary care physician for concern for possible UTI (no culture preabx, repeat from ED pending).  All other labs are fairly unremarkable.  She does report that she was seen last week by her primary care physician with urinary urgency frequency and UTI symptoms.  These of subsided on Bactrim.  No fevers or chills.  She does report that she has had fairly significant p.o. intolerance.  She is had nausea and vomiting and not able to keep much water down.  She feels like she is fairly dehydrated today.  She does have a personal history of kidney stones.  She underwent an endoscopic procedure with Dr. Jeffie Pollock 06/2017.  She also passed a stone spontaneously in 2011 or 2012.    Stone composition unknown but she believes that she did have some uric acid complement.     PMH: Past Medical History:  Diagnosis Date  . CIN III (cervical intraepithelial neoplasia III)   . Dysuria   . GERD (gastroesophageal reflux disease)   . Hematuria   . HH (hiatus hernia)   . History of cervical dysplasia 2006   CIN1 and CIN2  . History of kidney stones   . Migraines   . PCOS (polycystic ovarian syndrome)    Dr. Helane Rima  . PONV (postoperative nausea and vomiting)   . Renal calculus, left   . Right ureteral calculus       Surgical History: Past Surgical History:  Procedure Laterality Date  . CERVICAL BIOPSY  W/ LOOP ELECTRODE EXCISION  12-04-2004   dr Helane Rima  [facility]  . COLONOSCOPY  03/2008   Dr. Michail Sermon  . COMBINED HYSTEROSCOPY DIAGNOSTIC / D&C  02-28-2012   dr Helane Rima   @ [facility]  . CYSTOSCOPY WITH RETROGRADE PYELOGRAM, URETEROSCOPY AND STENT PLACEMENT Bilateral 07/21/2017   Procedure: CYSTOSCOPY WITH BILATERAL  RETROGRADE BILATERAL URETEROSCOPY AND STENTS  PLACEMENT;  Surgeon: Irine Seal, MD;  Location: [facility];  Service: Urology;  Laterality: Bilateral;  . endoscopy  2010   Dr. Amedeo Plenty  . FOOT SURGERY Left 2015   5th bunionectomy w/ 2nd digit w/ fusion , retained hardware w/ 5th toe  . HOLMIUM LASER APPLICATION Bilateral 04/23/5186   Procedure: HOLMIUM LASER APPLICATION;  Surgeon: Irine Seal, MD;  Location: [facility];  Service: Urology;  Laterality: Bilateral;    Home Medications:  Allergies as of 03/14/2018      Reactions   Adhesive [tape]    Per pt only the 12 lead ekg pads cause "welps and abrasions"   Vancomycin Hives, Itching      Medication List       Accurate as of March 14, 2018 10:57 AM. Always use your most recent med list.  diphenhydrAMINE 25 MG tablet Commonly known as:  BENADRYL Take 25 mg by mouth every 6 (six) hours as needed.   EXCEDRIN MIGRAINE PO Take by mouth as needed.   ibuprofen 200 MG tablet Commonly known as:  ADVIL,MOTRIN Take 200 mg by mouth every 6 (six) hours as needed.   ketorolac 10 MG tablet Commonly known as:  TORADOL Take 1 tablet (10 mg total) by mouth every 6 (six) hours as needed.   nitrofurantoin (macrocrystal-monohydrate) 100 MG capsule Commonly known as:  MACROBID Take 1 capsule (100 mg total) by mouth 2 (two) times daily for 7 days.   ondansetron 4 MG tablet Commonly known as:  ZOFRAN Take 1 tablet (4 mg total) by mouth daily as needed for nausea or vomiting.   oxyCODONE-acetaminophen 7.5-325 MG  tablet Commonly known as:  PERCOCET Take 1 tablet by mouth every 4 (four) hours as needed for severe pain.   phenazopyridine 200 MG tablet Commonly known as:  PYRIDIUM Take 1 tablet (200 mg total) by mouth 3 (three) times daily as needed for pain.   potassium citrate 10 MEQ (1080 MG) SR tablet Commonly known as:  UROCIT-K Take 10 mEq by mouth 3 (three) times daily with meals.   ranitidine 150 MG tablet Commonly known as:  ZANTAC Take 150 mg by mouth as needed for heartburn.   sulfamethoxazole-trimethoprim 800-160 MG tablet Commonly known as:  BACTRIM DS,SEPTRA DS Take 1 tablet by mouth 2 (two) times daily.   tamsulosin 0.4 MG Caps capsule Commonly known as:  FLOMAX Take 1 capsule (0.4 mg total) by mouth daily after breakfast.       Allergies:  Allergies  Allergen Reactions  . Adhesive [Tape]     Per pt only the 12 lead ekg pads cause "welps and abrasions"  . Vancomycin Hives and Itching    Family History: No family history on file.  Social History:  reports that she has never smoked. She has never used smokeless tobacco. She reports that she does not drink alcohol or use drugs.  ROS: UROLOGY Frequent Urination?: No Hard to postpone urination?: No Burning/pain with urination?: Yes Get up at night to urinate?: No Leakage of urine?: No Urine stream starts and stops?: No Trouble starting stream?: No Do you have to strain to urinate?: No Blood in urine?: Yes Urinary tract infection?: Yes Sexually transmitted disease?: No Injury to kidneys or bladder?: No Painful intercourse?: No Weak stream?: No Currently pregnant?: No Vaginal bleeding?: No Last menstrual period?: n  Gastrointestinal Nausea?: Yes Vomiting?: Yes Indigestion/heartburn?: Yes Diarrhea?: No Constipation?: Yes  Constitutional Fever: No Night sweats?: No Weight loss?: No Fatigue?: Yes  Skin Skin rash/lesions?: No Itching?: No  Eyes Blurred vision?: No Double vision?:  No  Ears/Nose/Throat Sore throat?: No Sinus problems?: No  Hematologic/Lymphatic Swollen glands?: No Easy bruising?: No  Cardiovascular Leg swelling?: No Chest pain?: No  Respiratory Cough?: No Shortness of breath?: No  Endocrine Excessive thirst?: No  Musculoskeletal Back pain?: Yes Joint pain?: No  Neurological Headaches?: Yes Dizziness?: No  Psychologic Depression?: No Anxiety?: No  Physical Exam: BP 119/78   Pulse (!) 105   Ht 5\' 2"  (1.575 m)   Wt 195 lb (88.5 kg)   BMI 35.67 kg/m   Constitutional:  Alert and oriented, No acute distress. HEENT: [place] AT, moist mucus membranes.  Trachea midline, no masses. Cardiovascular: No clubbing, cyanosis, or edema. Respiratory: Normal respiratory effort, no increased work of breathing. GI: Abdomen is soft, nontender, nondistended, no abdominal masses GU: No  CVA tenderness Lymph: No cervical or inguinal lymphadenopathy. Skin: No rashes, bruises or suspicious lesions. Neurologic: Grossly intact, no focal deficits, moving all 4 extremities. Psychiatric: Normal mood and affect.  Laboratory Data: Lab Results  Component Value Date   WBC 13.3 (H) 03/13/2018   HGB 14.3 03/13/2018   HCT 45.1 03/13/2018   MCV 86.9 03/13/2018   PLT 334 03/13/2018    Lab Results  Component Value Date   CREATININE 1.07 (H) 03/13/2018    Urinalysis UA reviewed today, see epic for details.  Appears similar that yesterday.  Pertinent Imaging: Results for orders placed during the hospital encounter of 03/13/18  CT Renal Stone Study   Narrative CLINICAL DATA:  Left-sided flank pain beginning today. History of renal stone disease.  EXAM: CT ABDOMEN AND PELVIS WITHOUT CONTRAST  TECHNIQUE: Multidetector CT imaging of the abdomen and pelvis was performed following the standard protocol without IV contrast.  COMPARISON:  07/18/2017  FINDINGS: Lower chest: Normal  Hepatobiliary: Normal  Pancreas: Normal  Spleen:  Normal  Adrenals/Urinary Tract: Adrenal glands are normal. Right kidney contains a nonobstructing 8 mm stone in the lower pole. No passing stone on the right. The left kidney is swollen with hydroureteronephrosis. 6 mm stone in the extrarenal pelvis. The left ureter is dilated to the pelvic brim, where there is a 3 x 4 mm stone. No stone distal to that. Phleboliths are present in pelvis. No stone in the bladder.  Stomach/Bowel: Normal  Vascular/Lymphatic: Normal  Reproductive: Normal  Other: No free fluid or air.  Musculoskeletal: Normal  IMPRESSION: Hydroureteronephrosis on the left. 3 x 4 mm stone in the left ureter at the pelvic brim responsible for the obstruction. 6 mm stone in the extrarenal pelvis on the left.  8 mm nonobstructing stone in the lower pole of the right kidney.   Electronically Signed   By: Nelson Chimes M.D.   On: 03/13/2018 11:00    CT scan imaging was personally reviewed today with the patient.  Agree with radiologic interpretation.  Assessment & Plan:    1. Left ureteral stone 4 mm right obstructing ureteral stone at the level of the iliac vessels as well as a nonobstructing 6 mm renal pelvic stone with probable impending passage  She continues to be fairly symptomatic with left flank pain as well as p.o. intolerance.  We discussed options for treatment including ESWL of her obstructing calculus versus left ureteroscopy to treat both the ureteral calculus as well as renal pelvic stone which will likely eminently because of pain and obstruction.  She is most interested in ureteroscopy.  This can be booked as soon as Friday with my partner, Dr. Diamantina Providence to which she is agreeable.  We discussed the risk and benefits of ureteroscopy including the risk of bleeding, infection, damage to surrounding structures, ureteral perforation amongst others.  Notably, she has history of poor stent tolerance, explained the importance of having a ureteral stent which  will likely remain in place at least 3 days postoperatively.  We will try to manage her pain as best possible.  She understands all this and is agreeable.  Warning symptoms reviewed today.  She does have p.o. intolerance.  She has a prescription for Zofran which I encouraged her to take up to every 4 hours.  I have encouraged small amounts of frequent hydration.  If she is unable to keep anything down, she will need to go to the emergency room for more urgent treatment.  She understands this.   -  Urinalysis, Complete  2. Other hydronephrosis Secondary #1  3. Right kidney stone Large right lower pole nonobstructing stone, will likely need intervention for this in the future She would likely benefit from 24-hour urine analysis when she is stone free to assess whether or not we can prevent the stones from happening  4. Dehydration As above    Return for Please sign release for Dr. Jeffie Pollock records.  Get a left ureteroscopy with laser lithotripsy and stent placement with Dr. Diamantina Providence on Friday.  Hollice Espy, MD  [facility] [address] [place], [place] [address] [phone]

## 2018-03-15 ENCOUNTER — Other Ambulatory Visit: Payer: Self-pay

## 2018-03-15 DIAGNOSIS — Z91018 Allergy to other foods: Secondary | ICD-10-CM

## 2018-03-16 ENCOUNTER — Other Ambulatory Visit: Payer: Self-pay

## 2018-03-16 ENCOUNTER — Encounter
Admission: RE | Admit: 2018-03-16 | Discharge: 2018-03-16 | Disposition: A | Discharge status: Home / Self Care | Payer: Managed Care, Other (non HMO) | Source: Ambulatory Visit | Attending: Urology | Admitting: Urology

## 2018-03-16 HISTORY — DX: Sleep apnea, unspecified: G47.30

## 2018-03-16 MED ORDER — CEFAZOLIN SODIUM-DEXTROSE 2-4 GM/100ML-% IV SOLN
2.0000 g | INTRAVENOUS | Status: AC
  Administered 2018-03-17: 2 g via INTRAVENOUS

## 2018-03-16 NOTE — Patient Instructions (Signed)
  Your procedure is scheduled on:  Report to Same Day Surgery 2nd floor [facility] Entrance-take elevator on left to 2nd floor.  Check in with surgery information desk.) To find out your arrival time, call [phone] 1:00-3:00 PM on   Remember: Instructions that are not followed completely may result in serious medical risk, up to and including death, or upon the discretion of your surgeon and anesthesiologist your surgery may need to be rescheduled.    __x__ 1. Do not eat food (including mints, candies, chewing gum) after midnight the night before your procedure. You may drink clear liquids up to 2 hours before you are scheduled to arrive at the hospital for your procedure.  Do not drink anything within 2 hours of your scheduled arrival to the hospital.  Approved clear liquids:  --Water or Apple juice without pulp  --Clear carbohydrate beverage such as Gatorade or Powerade  --Black Coffee or Clear Tea (No milk, no creamers, do not add anything to the coffee or tea)    __x__ 2. No Alcohol for 24 hours before or after surgery.   __x__ 3. No Smoking or e-cigarettes for 24 hours before surgery.  Do not use any chewable tobacco products for at least 6 hours before surgery.   __x__ 4. Notify your doctor if there is any change in your medical condition (cold, fever, infections).   __x__ 5. On the morning of surgery brush your teeth with toothpaste and water.  You may rinse your mouth with mouthwash if you wish.  Do not swallow any toothpaste or mouthwash.  Please read over the following fact sheets that you were given:   [facility] Preparing for Surgery and/or MRSA Information    __x__ Use antibacterial soap such as Dial to shower/bathe on the day of surgery.   Do not wear jewelry, make-up, hairpins, clips or nail polish on the day of surgery.  Do not wear lotions, powders, deodorant, or perfumes.   Do not shave below the face/neck 48 hours prior to surgery.   Do not  bring valuables to the hospital.    [facility] is not responsible for any belongings or valuables.               Contacts, dentures or bridgework may not be worn into surgery.  For patients discharged on the day of surgery, you will NOT be permitted to drive yourself home.  You must have a responsible adult with you for 24 hours after surgery.  __x__ Take these medicines on the morning of surgery with a SMALL SIP OF WATER:  1. Ranitidine  2. Percocet if needed  __x__ Follow recommendations from Cardiologist, Pulmonologist or PCP regarding stopping Aspirin, Coumadin, Plavix, Eliquis, Effient, Pradaxa, and Pletal.  __x__ TODAY: Stop Anti-inflammatories such as Advil, Ibuprofen, Motrin, Aleve, Naproxen, Naprosyn, BC/Goodies powders or aspirin products. You may continue to take Tylenol and Celebrex.   __x__ TODAY: Stop supplements until after surgery. You may continue to take Vitamin D, Vitamin B, and multivitamin.  Reviewed with patient via telephone 03/16/2018 @ [place]

## 2018-03-17 ENCOUNTER — Encounter: Payer: Self-pay | Admitting: *Deleted

## 2018-03-17 ENCOUNTER — Ambulatory Visit: Payer: Managed Care, Other (non HMO) | Admitting: Anesthesiology

## 2018-03-17 ENCOUNTER — Telehealth: Payer: Self-pay

## 2018-03-17 ENCOUNTER — Ambulatory Visit
Admission: RE | Admit: 2018-03-17 | Discharge: 2018-03-17 | Disposition: A | Discharge status: Home / Self Care | Payer: Managed Care, Other (non HMO) | Attending: Urology | Admitting: Urology

## 2018-03-17 ENCOUNTER — Encounter
Admission: RE | Disposition: A | Discharge status: Home / Self Care | Payer: Self-pay | Source: Home / Self Care | Attending: Urology

## 2018-03-17 ENCOUNTER — Other Ambulatory Visit: Payer: Self-pay

## 2018-03-17 DIAGNOSIS — N202 Calculus of kidney with calculus of ureter: Secondary | ICD-10-CM | POA: Diagnosis present

## 2018-03-17 DIAGNOSIS — N201 Calculus of ureter: Secondary | ICD-10-CM | POA: Diagnosis not present

## 2018-03-17 DIAGNOSIS — K449 Diaphragmatic hernia without obstruction or gangrene: Secondary | ICD-10-CM | POA: Diagnosis not present

## 2018-03-17 DIAGNOSIS — G4730 Sleep apnea, unspecified: Secondary | ICD-10-CM | POA: Diagnosis not present

## 2018-03-17 DIAGNOSIS — N200 Calculus of kidney: Secondary | ICD-10-CM | POA: Diagnosis not present

## 2018-03-17 DIAGNOSIS — K219 Gastro-esophageal reflux disease without esophagitis: Secondary | ICD-10-CM | POA: Diagnosis not present

## 2018-03-17 DIAGNOSIS — G43909 Migraine, unspecified, not intractable, without status migrainosus: Secondary | ICD-10-CM | POA: Diagnosis not present

## 2018-03-17 HISTORY — PX: CYSTOSCOPY/URETEROSCOPY/HOLMIUM LASER/STENT PLACEMENT: SHX6546

## 2018-03-17 LAB — POCT PREGNANCY, URINE: Preg Test, Ur: NEGATIVE

## 2018-03-17 SURGERY — CYSTOSCOPY/URETEROSCOPY/HOLMIUM LASER/STENT PLACEMENT
Anesthesia: General | Laterality: Left

## 2018-03-17 MED ORDER — SULFAMETHOXAZOLE-TRIMETHOPRIM 800-160 MG PO TABS: 1.0000 | ORAL_TABLET | Freq: Two times a day (BID) | ORAL | 0 refills | Status: DC

## 2018-03-17 MED ORDER — BELLADONNA ALKALOIDS-OPIUM 16.2-60 MG RE SUPP
RECTAL | Status: DC | PRN
  Administered 2018-03-17: 1 via RECTAL

## 2018-03-17 MED ORDER — SUGAMMADEX SODIUM 200 MG/2ML IV SOLN
INTRAVENOUS | Status: DC | PRN
  Administered 2018-03-17: 200 mg via INTRAVENOUS

## 2018-03-17 MED ORDER — FENTANYL CITRATE (PF) 100 MCG/2ML IJ SOLN
25.0000 ug | INTRAMUSCULAR | Status: DC | PRN
  Administered 2018-03-17 (×3): 50 ug via INTRAVENOUS

## 2018-03-17 MED ORDER — IOPAMIDOL (ISOVUE-M 200) INJECTION 41%
INTRAMUSCULAR | Status: DC | PRN
  Administered 2018-03-17: 10 mL

## 2018-03-17 MED ORDER — MIDAZOLAM HCL 2 MG/2ML IJ SOLN
INTRAMUSCULAR | Status: AC
  Filled 2018-03-17: qty 2

## 2018-03-17 MED ORDER — FENTANYL CITRATE (PF) 100 MCG/2ML IJ SOLN
INTRAMUSCULAR | Status: AC
  Administered 2018-03-17: 50 ug via INTRAVENOUS
  Filled 2018-03-17: qty 2

## 2018-03-17 MED ORDER — SCOPOLAMINE 1 MG/3DAYS TD PT72
1.0000 | MEDICATED_PATCH | Freq: Once | TRANSDERMAL | Status: DC
  Administered 2018-03-17: 1.5 mg via TRANSDERMAL

## 2018-03-17 MED ORDER — KETOROLAC TROMETHAMINE 30 MG/ML IJ SOLN
INTRAMUSCULAR | Status: DC | PRN
  Administered 2018-03-17: 15 mg via INTRAVENOUS

## 2018-03-17 MED ORDER — FAMOTIDINE 20 MG PO TABS
ORAL_TABLET | ORAL | Status: AC
  Administered 2018-03-17: 20 mg via ORAL
  Filled 2018-03-17: qty 1

## 2018-03-17 MED ORDER — LIDOCAINE HCL (CARDIAC) PF 100 MG/5ML IV SOSY
PREFILLED_SYRINGE | INTRAVENOUS | Status: DC | PRN
  Administered 2018-03-17: 100 mg via INTRAVENOUS

## 2018-03-17 MED ORDER — CEFAZOLIN SODIUM-DEXTROSE 2-4 GM/100ML-% IV SOLN
INTRAVENOUS | Status: AC
  Filled 2018-03-17: qty 100

## 2018-03-17 MED ORDER — ONDANSETRON HCL 4 MG/2ML IJ SOLN
INTRAMUSCULAR | Status: DC | PRN
  Administered 2018-03-17: 4 mg via INTRAVENOUS

## 2018-03-17 MED ORDER — ROCURONIUM BROMIDE 100 MG/10ML IV SOLN
INTRAVENOUS | Status: DC | PRN
  Administered 2018-03-17: 40 mg via INTRAVENOUS

## 2018-03-17 MED ORDER — OXYBUTYNIN CHLORIDE ER 10 MG PO TB24: 10.0000 mg | ORAL_TABLET | Freq: Every day | ORAL | 0 refills | Status: AC

## 2018-03-17 MED ORDER — LACTATED RINGERS IV SOLN
INTRAVENOUS | Status: DC
  Administered 2018-03-17 (×2): via INTRAVENOUS

## 2018-03-17 MED ORDER — FLUCONAZOLE IN SODIUM CHLORIDE 400-0.9 MG/200ML-% IV SOLN
400.0000 mg | Freq: Once | INTRAVENOUS | Status: DC
  Filled 2018-03-17: qty 200

## 2018-03-17 MED ORDER — DEXAMETHASONE SODIUM PHOSPHATE 10 MG/ML IJ SOLN
INTRAMUSCULAR | Status: DC | PRN
  Administered 2018-03-17: 10 mg via INTRAVENOUS

## 2018-03-17 MED ORDER — SCOPOLAMINE 1 MG/3DAYS TD PT72
MEDICATED_PATCH | TRANSDERMAL | Status: AC
  Administered 2018-03-17: 1.5 mg via TRANSDERMAL
  Filled 2018-03-17: qty 1

## 2018-03-17 MED ORDER — FENTANYL CITRATE (PF) 100 MCG/2ML IJ SOLN
INTRAMUSCULAR | Status: DC | PRN
  Administered 2018-03-17 (×2): 50 ug via INTRAVENOUS

## 2018-03-17 MED ORDER — FENTANYL CITRATE (PF) 100 MCG/2ML IJ SOLN
INTRAMUSCULAR | Status: AC
  Filled 2018-03-17: qty 2

## 2018-03-17 MED ORDER — OXYCODONE-ACETAMINOPHEN 7.5-325 MG PO TABS: 1.0000 | ORAL_TABLET | ORAL | 0 refills | Status: AC | PRN

## 2018-03-17 MED ORDER — PROPOFOL 10 MG/ML IV BOLUS
INTRAVENOUS | Status: DC | PRN
  Administered 2018-03-17: 200 mg via INTRAVENOUS

## 2018-03-17 MED ORDER — PROMETHAZINE HCL 25 MG/ML IJ SOLN: 6.2500 mg | INTRAMUSCULAR | Status: DC | PRN

## 2018-03-17 MED ORDER — FAMOTIDINE 20 MG PO TABS
20.0000 mg | ORAL_TABLET | Freq: Once | ORAL | Status: AC
  Administered 2018-03-17: 20 mg via ORAL

## 2018-03-17 MED ORDER — FLUCONAZOLE IN SODIUM CHLORIDE 400-0.9 MG/200ML-% IV SOLN
INTRAVENOUS | Status: DC | PRN
  Administered 2018-03-17: 400 mg via INTRAVENOUS

## 2018-03-17 MED ORDER — PROPOFOL 10 MG/ML IV BOLUS
INTRAVENOUS | Status: AC
  Filled 2018-03-17: qty 20

## 2018-03-17 MED ORDER — LACTATED RINGERS IV SOLN
INTRAVENOUS | Status: DC | PRN
  Administered 2018-03-17: 11:00:00 via INTRAVENOUS

## 2018-03-17 MED ORDER — BELLADONNA ALKALOIDS-OPIUM 16.2-60 MG RE SUPP
RECTAL | Status: AC
  Filled 2018-03-17: qty 1

## 2018-03-17 SURGICAL SUPPLY — 35 items
BAG DRAIN CYSTO-URO LG1000N (MISCELLANEOUS) ×1 IMPLANT
BAG URO DRAIN 2000ML W/SPOUT (MISCELLANEOUS) ×1 IMPLANT
BRUSH SCRUB EZ 1% IODOPHOR (MISCELLANEOUS) ×2 IMPLANT
BULB IRRIG PATHFIND (MISCELLANEOUS) IMPLANT
CATH FOL 2WAY LX 16X30 (CATHETERS) ×1 IMPLANT
CATH URETL 5X70 OPEN END (CATHETERS) IMPLANT
CNTNR SPEC 2.5X3XGRAD LEK (MISCELLANEOUS) ×1
CONT SPEC 4OZ STER OR WHT (MISCELLANEOUS) ×1
CONT SPEC 4OZ STRL OR WHT (MISCELLANEOUS) ×1
CONTAINER SPEC 2.5X3XGRAD LEK (MISCELLANEOUS) IMPLANT
DRAPE UTILITY 15X26 TOWEL STRL (DRAPES) ×2 IMPLANT
FIBER LASER LITHO 273 (Laser) ×1 IMPLANT
GLOVE BIOGEL PI IND STRL 7.5 (GLOVE) ×1 IMPLANT
GLOVE BIOGEL PI INDICATOR 7.5 (GLOVE) ×1
GOWN STRL REUS W/ TWL LRG LVL3 (GOWN DISPOSABLE) ×1 IMPLANT
GOWN STRL REUS W/ TWL XL LVL3 (GOWN DISPOSABLE) ×1 IMPLANT
GOWN STRL REUS W/TWL LRG LVL3 (GOWN DISPOSABLE) ×2
GOWN STRL REUS W/TWL XL LVL3 (GOWN DISPOSABLE) ×2
GUIDEWIRE STR DUAL SENSOR (WIRE) ×2 IMPLANT
INFUSOR MANOMETER BAG 3000ML (MISCELLANEOUS) ×2 IMPLANT
INTRODUCER DILATOR DOUBLE (INTRODUCER) IMPLANT
KIT TURNOVER CYSTO (KITS) ×2 IMPLANT
PACK CYSTO AR (MISCELLANEOUS) ×2 IMPLANT
SET CYSTO W/LG BORE CLAMP LF (SET/KITS/TRAYS/PACK) ×2 IMPLANT
SHEATH URETERAL 12FRX35CM (MISCELLANEOUS) IMPLANT
SOL .9 NS 3000ML IRR  AL (IV SOLUTION) ×1
SOL .9 NS 3000ML IRR AL (IV SOLUTION) ×1
SOL .9 NS 3000ML IRR UROMATIC (IV SOLUTION) ×1 IMPLANT
STENT URET 6FRX24 CONTOUR (STENTS) IMPLANT
STENT URET 6FRX26 CONTOUR (STENTS) IMPLANT
SURGILUBE 2OZ TUBE FLIPTOP (MISCELLANEOUS) ×2 IMPLANT
SYR 10ML LL (SYRINGE) ×2 IMPLANT
TUBING ART PRESS 48 MALE/FEM (TUBING) IMPLANT
VALVE UROSEAL ADJ ENDO (VALVE) ×1 IMPLANT
WATER STERILE IRR 1000ML POUR (IV SOLUTION) ×2 IMPLANT

## 2018-03-17 NOTE — Anesthesia Procedure Notes (Signed)
Procedure Name: Intubation Performed by: Lesle Reek, CRNA Pre-anesthesia Checklist: Patient identified, Emergency Drugs available, Suction available, Patient being monitored and Timeout performed Patient Re-evaluated:Patient Re-evaluated prior to induction Oxygen Delivery Method: Circle system utilized Preoxygenation: Pre-oxygenation with 100% oxygen Induction Type: IV induction Laryngoscope Size: Mac and 3 Grade View: Grade II Tube type: Oral Tube size: 7.0 mm Number of attempts: 1 Airway Equipment and Method: Stylet Placement Confirmation: ETT inserted through vocal cords under direct vision,  CO2 detector,  breath sounds checked- equal and bilateral and positive ETCO2 Secured at: 22 cm Tube secured with: Tape

## 2018-03-17 NOTE — Discharge Instructions (Signed)

## 2018-03-17 NOTE — Transfer of Care (Signed)
Immediate Anesthesia Transfer of Care Note  Patient: Kayla Sexton  Procedure(s) Performed: CYSTOSCOPY/URETEROSCOPY/HOLMIUM LASER/STENT PLACEMENT (Left )  Patient Location: PACU  Anesthesia Type:General  Level of Consciousness: awake and alert   Airway & Oxygen Therapy: Patient Spontanous Breathing  Post-op Assessment: Report given to RN  Post vital signs: Reviewed and stable  Last Vitals:  Vitals Value Taken Time  BP    Temp    Pulse    Resp    SpO2      Last Pain:  Vitals:   03/17/18 1004  TempSrc: Oral  PainSc: 0-No pain      Patients Stated Pain Goal: 0 (58/85/02 7741)  Complications: No apparent anesthesia complications

## 2018-03-17 NOTE — OR Nursing (Signed)
Foley removed pr md order, notified MD urine was blood tinged but appears clear no new orders, verified antibiotic order at discharge, MD sent order to pharmacy. Patient aware.

## 2018-03-17 NOTE — Anesthesia Preprocedure Evaluation (Addendum)
Anesthesia Evaluation  Patient identified by MRN, date of birth, ID band Patient awake    Reviewed: Allergy & Precautions, H&P , NPO status , Patient's Chart, lab work & pertinent test results  History of Anesthesia Complications (+) PONV and history of anesthetic complications  Airway Mallampati: III  TM Distance: <3 FB Neck ROM: full    Dental  (+) Chipped, Poor Dentition   Pulmonary neg shortness of breath, sleep apnea ,           Cardiovascular Exercise Tolerance: Good (-) angina(-) Past MI and (-) DOE negative cardio ROS       Neuro/Psych  Headaches, PSYCHIATRIC DISORDERS    GI/Hepatic Neg liver ROS, hiatal hernia, GERD  Medicated and Controlled,  Endo/Other  negative endocrine ROS  Renal/GU Renal disease     Musculoskeletal   Abdominal   Peds  Hematology negative hematology ROS (+)   Anesthesia Other Findings Past Medical History: No date: CIN III (cervical intraepithelial neoplasia III) No date: Dysuria No date: GERD (gastroesophageal reflux disease) No date: Hematuria No date: HH (hiatus hernia) 2006: History of cervical dysplasia     Comment:  CIN1 and CIN2 No date: History of kidney stones No date: Migraines No date: PCOS (polycystic ovarian syndrome)     Comment:  Dr. Helane Rima No date: PONV (postoperative nausea and vomiting) No date: Renal calculus, left No date: Right ureteral calculus No date: Sleep apnea  Past Surgical History: 12-04-2004   dr Helane Rima  [facility]: CERVICAL BIOPSY  W/ LOOP ELECTRODE  EXCISION 03/2008: COLONOSCOPY     Comment:  Dr. Michail Sermon 02-28-2012   dr Helane Rima   @ [facility]: COMBINED HYSTEROSCOPY DIAGNOSTIC / D&C 07/21/2017: CYSTOSCOPY WITH RETROGRADE PYELOGRAM, URETEROSCOPY AND  STENT PLACEMENT; Bilateral     Comment:  Procedure: CYSTOSCOPY WITH BILATERAL  RETROGRADE               BILATERAL URETEROSCOPY AND STENTS  PLACEMENT;  Surgeon:               Irine Seal, MD;  Location:  [facility];                Service: Urology;  Laterality: Bilateral; 2010: endoscopy     Comment:  Dr. Amedeo Plenty 2015: FOOT SURGERY; Left     Comment:  5th bunionectomy w/ 2nd digit w/ fusion , retained               hardware w/ 5th toe 07/21/2017: HOLMIUM LASER APPLICATION; Bilateral     Comment:  Procedure: HOLMIUM LASER APPLICATION;  Surgeon: Irine Seal, MD;  Location: [facility];                Service: Urology;  Laterality: Bilateral;     Reproductive/Obstetrics negative OB ROS                             Anesthesia Physical Anesthesia Plan  ASA: III  Anesthesia Plan: General ETT   Post-op Pain Management:    Induction: Intravenous  PONV Risk Score and Plan: Ondansetron, Dexamethasone, Midazolam, Treatment may vary due to age or medical condition and Scopolamine patch - Pre-op  Airway Management Planned: Oral ETT  Additional Equipment:   Intra-op Plan:   Post-operative Plan: Extubation in OR  Informed Consent: I have reviewed the patients History and Physical, chart, labs and discussed the  procedure including the risks, benefits and alternatives for the proposed anesthesia with the patient or authorized representative who has indicated his/her understanding and acceptance.     Dental Advisory Given  Plan Discussed with: Anesthesiologist, CRNA and Surgeon  Anesthesia Plan Comments: (Patient consented for risks of anesthesia including but not limited to:  - adverse reactions to medications - damage to teeth, lips or other oral mucosa - sore throat or hoarseness - Damage to heart, brain, lungs or loss of life  Patient voiced understanding.)       Anesthesia Quick Evaluation

## 2018-03-17 NOTE — Progress Notes (Signed)
Cystoscopy/ Stent removal procedure  Indications: Status post left ureteroscopic stone removal of a mid ureteral calculus by Dr. Diamantina Providence 03/17/2018 no postoperative problems and she has tolerated her stent well.   Patient identification was confirmed, informed consent was obtained, and patient was prepped using Betadine solution.  Lidocaine jelly was administered per urethral meatus.    Preoperative abx where received prior to procedure.    Procedure: - Flexible cystoscope introduced, without any difficulty.   - Thorough search of the bladder revealed:    normal urethral meatus  Stent seen emanating from left ureteral orifice, grasped with stent graspers, and removed in entirety.    Post-Procedure: - Patient tolerated the procedure well  Impression: -She is a recurrent stone former and has a 8 mm nonobstructing right renal calculus.  Her stone analysis is pending.  Have recommended a metabolic [id number]/[id number] urine study and she will follow-up with Dr. Diamantina Providence for the results.   Abbie Sons, MD  I, Temidayo Atanda-Ogunleye , am acting as a scribe for Abbie Sons, MD  I, Abbie Sons, MD, have reviewed all documentation for this visit. The documentation on 03/20/18 for the exam, diagnosis, procedures, and orders are all accurate and complete.

## 2018-03-17 NOTE — H&P (Signed)
UROLOGY H&P UPDATE  Agree with prior H&P dated 03/14/2018. LEFT 62mm mid ureteral stone, and LEFT 37mm renal stone. Also has 79mm right lower pole stone that will need to be addressed in the future, likely with SWL.  Cardiac: RRR Lungs: CTA bilaterally  Laterality: LEFT Procedure: LEFT URS/LL/STENT  Urine: culture 2/17 no growth  Informed consent obtained, we specifically discussed the risks of bleeding, infection, post-operative pain, need for additional procedures, stent related symptoms, need for stent removal in follow up, and need for follow up management of right sided lower pole stone.  Billey Co, MD 03/17/2018

## 2018-03-17 NOTE — Op Note (Signed)
Date of procedure: 03/17/18  Preoperative diagnosis:  1. Left mid ureteral stone, left renal stone  Postoperative diagnosis:  1. Same  Procedure: 1. Cystoscopy, left ureteroscopy, laser lithotripsy, left retrograde pyelogram with intraoperative interpretation, left ureteral stent placement  Surgeon: Nickolas Madrid, MD  Anesthesia: General  Complications: None  Intraoperative findings:  1.  Normal cystoscopy, ureteral orifices orthotopic bilaterally 2.  Uncomplicated fragmentation of left mid ureteral stone, fragments flush free and sent for analysis 3.  Cloudy urine and renal pelvis aspirated, uncomplicated dusting of left renal stone 4.  Left ureteral stent placement  EBL: None  Specimens: Stone for analysis  Drains: Left 6 French by 24 cm ureteral stent, 16 French Foley  Indication: Kayla Sexton is a 38 y.o. patient with poorly controlled left-sided flank and groin pain and a left 5 mm mid ureteral stone and left 6 mm renal stone.  After reviewing the management options for treatment, they elected to proceed with the above surgical procedure(s). We have discussed the potential benefits and risks of the procedure, side effects of the proposed treatment, the likelihood of the patient achieving the goals of the procedure, and any potential problems that might occur during the procedure or recuperation. Informed consent has been obtained.  Description of procedure:  The patient was taken to the operating room and general anesthesia was induced. SCDs were placed for DVT prophylaxis. The patient was placed in the dorsal lithotomy position, prepped and draped in the usual sterile fashion, and preoperative antibiotics were administered. A preoperative time-out was performed.   A 21 French rigid cystoscope was used to intubate the urethra.  Thorough cystoscopy was performed and was grossly normal.  The ureteral orifices were orthotopic bilaterally.  A sensor wire was advanced into the  left ureteral orifice and up to the kidney under fluoroscopic vision.  A semirigid ureteroscope was then advanced into the left ureteral orifice and we identified a yellow stone in the mid ureter.  This was fragmented to dust using the 270 m laser fiber on settings of 1.0 J and 10 Hz.  The fragments were irrigated free of the ureter and sent for stone analysis.  The flexible single-channel ureteroscope was then advanced over the wire into the collecting system.  There was some cloudy urine, and this was aspirated free to improve vision.  We identified a 7 mm left renal stone, and this was fragmented to dust on settings of 0.5 J and 20 Hz.  Thorough pyeloscopy revealed no residual fragments.  Retrograde pyelogram was performed to aid in stent placement and showed no residual filling defects.  Sensor wire was replaced through the scope, and pullback ureteroscopy demonstrated no ureteral fragments or ureteral injury.  A 6 French by 24 cm ureteral stent was placed under fluoroscopic vision on the left side.  There was a shepherd's hook curl in the left renal pelvis, and an excellent curl under direct vision the bladder.  I purposefully left a slightly shorter stent in the setting of her severe pain from a slightly longer stent from prior ureteroscopy.  Under direct vision, urine and contrast drained briskly through the side ports.  A 16 French Foley was placed to temporarily maximize drainage in the setting of her cloudy urine.  Disposition: Stable to PACU  Plan: Treatment dose Bactrim x3 days Remove Foley prior to discharge Flomax and oxybutynin for stent pain Stent removal in 3 to 5 days in clinic Follow-up stone analysis, consider 24-hour urine in the future  Nickolas Madrid,  MD   

## 2018-03-17 NOTE — Telephone Encounter (Signed)
Pt states she is ok. She says that HR will not excepted a note or letter they are requiring her to have some form of FMLA. She says her job is on the line. [place]

## 2018-03-17 NOTE — Anesthesia Postprocedure Evaluation (Signed)
Anesthesia Post Note  Patient: NICKISHA HUM  Procedure(s) Performed: CYSTOSCOPY/URETEROSCOPY/HOLMIUM LASER/STENT PLACEMENT (Left )  Patient location during evaluation: PACU Anesthesia Type: General Level of consciousness: awake and alert Pain management: pain level controlled Vital Signs Assessment: post-procedure vital signs reviewed and stable Respiratory status: spontaneous breathing, nonlabored ventilation, respiratory function stable and patient connected to nasal cannula oxygen Cardiovascular status: blood pressure returned to baseline and stable Postop Assessment: no apparent nausea or vomiting Anesthetic complications: no     Last Vitals:  Vitals:   03/17/18 1226 03/17/18 1240  BP: 113/71 115/72  Pulse: 60 79  Resp: 14 16  Temp: 36.4 C (!) 36.3 C  SpO2: 97% 100%    Last Pain:  Vitals:   03/17/18 1240  TempSrc: Temporal  PainSc: 3                  Precious Haws Piscit[place]

## 2018-03-17 NOTE — Anesthesia Post-op Follow-up Note (Signed)
Anesthesia QCDR form completed.        

## 2018-03-18 NOTE — Telephone Encounter (Signed)
Have her come in and I will fill it out as best that I can

## 2018-03-20 ENCOUNTER — Encounter: Payer: Self-pay | Admitting: Urology

## 2018-03-20 ENCOUNTER — Ambulatory Visit (INDEPENDENT_AMBULATORY_CARE_PROVIDER_SITE_OTHER): Payer: Managed Care, Other (non HMO) | Admitting: Urology

## 2018-03-20 VITALS — BP 152/92 | HR 101 | Ht 62.0 in | Wt 193.8 lb

## 2018-03-20 DIAGNOSIS — N200 Calculus of kidney: Secondary | ICD-10-CM

## 2018-03-20 LAB — MICROSCOPIC EXAMINATION

## 2018-03-20 LAB — URINALYSIS, COMPLETE
Bilirubin, UA: NEGATIVE
Glucose, UA: NEGATIVE
Ketones, UA: NEGATIVE
Nitrite, UA: NEGATIVE
Specific Gravity, UA: 1.02 (ref 1.005–1.030)
Urobilinogen, Ur: 0.2 mg/dL (ref 0.2–1.0)
pH, UA: 7 (ref 5.0–7.5)

## 2018-03-20 MED ORDER — CIPROFLOXACIN HCL 500 MG PO TABS
500.0000 mg | ORAL_TABLET | Freq: Once | ORAL | Status: AC
  Administered 2018-03-20: 500 mg via ORAL

## 2018-03-20 MED ORDER — LIDOCAINE HCL URETHRAL/MUCOSAL 2 % EX GEL: 1.0000 "application " | Freq: Once | CUTANEOUS | Status: DC

## 2018-03-20 NOTE — Patient Instructions (Signed)
[facility] Instructions [facility]  You will receive a box/kit in the mail that will have a urine jug and instructions in the kit.  When the box arrives you will need to call our office [phone] to schedule a LAB appointment.  You will need to do a 24hour urine and this should be done during the days that our office will be open.  For example any day from Sunday through Thursday.  How to collect the urine sample: On the day you start the urine sample this 1st morning urine should NOT be collected.  For the rest of the day including all night urines should be collected.  On the next morning the 1st urine should be collected and then you will be finished with the urine collections.  You will need to bring the box with you on your LAB appointment day after urine has been collected and all instructions are complete in the box.  Your blood will be drawn and the box will be collected by our Lab employee to be sent off for analysis.  When urine and blood is complete you will need to schedule a follow up appointment for lab results.

## 2018-03-20 NOTE — Telephone Encounter (Signed)
lvm for pt to make an appt for FMLA forms. [place]

## 2018-03-23 ENCOUNTER — Ambulatory Visit
Admission: RE | Admit: 2018-03-23 | Discharge: 2018-03-23 | Disposition: A | Discharge status: Home / Self Care | Payer: Managed Care, Other (non HMO) | Source: Ambulatory Visit | Attending: Urology | Admitting: Urology

## 2018-03-23 ENCOUNTER — Ambulatory Visit
Admission: RE | Admit: 2018-03-23 | Discharge: 2018-03-23 | Disposition: A | Discharge status: Home / Self Care | Payer: Managed Care, Other (non HMO) | Attending: Urology | Admitting: Urology

## 2018-03-23 ENCOUNTER — Other Ambulatory Visit: Payer: Self-pay | Admitting: Family Medicine

## 2018-03-23 ENCOUNTER — Telehealth: Payer: Self-pay | Admitting: Urology

## 2018-03-23 DIAGNOSIS — N200 Calculus of kidney: Secondary | ICD-10-CM | POA: Insufficient documentation

## 2018-03-23 NOTE — Telephone Encounter (Signed)
Please schedule her to see me in 1-2 weeks with KUB prior to discuss RIGHT SWL.  Thanks Nickolas Madrid, MD 03/23/2018

## 2018-03-23 NOTE — Telephone Encounter (Signed)
App is scheduled but I will need an order for the KUB please  Thanks, Sharyn Lull

## 2018-03-23 NOTE — Telephone Encounter (Signed)
Pt. Would like for someone to call her and discuss lab results, as well as another surgery for additional stone. Pt.states she is new to our office and is not sure who to call after surgery and or office visits.

## 2018-03-23 NOTE — Addendum Note (Signed)
Addended by: Billey Co on: 03/23/2018 04:48 PM   Modules accepted: Orders

## 2018-03-28 ENCOUNTER — Ambulatory Visit (INDEPENDENT_AMBULATORY_CARE_PROVIDER_SITE_OTHER): Payer: Managed Care, Other (non HMO) | Admitting: Urology

## 2018-03-28 ENCOUNTER — Telehealth: Payer: Self-pay | Admitting: Radiology

## 2018-03-28 ENCOUNTER — Telehealth: Payer: Self-pay

## 2018-03-28 ENCOUNTER — Encounter: Payer: Self-pay | Admitting: Urology

## 2018-03-28 ENCOUNTER — Other Ambulatory Visit: Payer: Self-pay | Admitting: Radiology

## 2018-03-28 VITALS — BP 139/96 | HR 101 | Ht 62.0 in | Wt 195.0 lb

## 2018-03-28 DIAGNOSIS — N200 Calculus of kidney: Secondary | ICD-10-CM

## 2018-03-28 NOTE — Telephone Encounter (Signed)
Pt was informed.

## 2018-03-28 NOTE — Progress Notes (Signed)
   03/28/2018 2:16 PM   [name] 1980-04-22 [id number]  Reason for visit: Follow up nephrolithiasis  HPI: I saw [name] back in urology clinic for discussion of nephrolithiasis.  Briefly, she is a 38 year old female with long history of stone disease who recently underwent left ureteroscopy, laser lithotripsy, and stent placement with me for left ureteral and renal stones.  Her stent has since been removed and she is doing well.  She is here today to discuss treatment options for a right non-obstructing 8 mm renal stone.  On CT scan, it measures 950HU, with 12 cm skin to stone distance  We discussed various treatment options for urolithiasis including observation with or without medical expulsive therapy, shockwave lithotripsy (SWL), ureteroscopy and laser lithotripsy with stent placement, and percutaneous nephrolithotomy.  We discussed that management is based on stone size, location, density, patient co-morbidities, and patient preference.   Stones <24mm in size have a >80% spontaneous passage rate. Data surrounding the use of tamsulosin for medical expulsive therapy is controversial, but meta analyses suggests it is most efficacious for distal stones between 5-44mm in size. Possible side effects include dizziness/lightheadedness, and retrograde ejaculation.  SWL has a lower stone free rate in a single procedure, but also a lower complication rate compared to ureteroscopy and avoids a stent and associated stent related symptoms. Possible complications include renal hematoma, steinstrasse, and need for additional treatment.  Ureteroscopy with laser lithotripsy and stent placement has a higher stone free rate than SWL in a single procedure, however increased complication rate including possible infection, ureteral injury, bleeding, and stent related morbidity. Common stent related symptoms include dysuria, urgency/frequency, and flank pain.  After an extensive discussion of the risks and  benefits of the above treatment options, the patient would like to proceed with RIGHT URETEROSCOPY/LASER LITHOTRIPSY/STENT PLACEMENT  She did well with a 35F x24cm stent previously   A total of 15 minutes were spent face-to-face with the patient, greater than 50% was spent in patient education, counseling, and coordination of care regarding nephrolithiasis and treatment strategies.  Billey Co, [facility] [address] [place], [place] [address] [phone]

## 2018-03-28 NOTE — Telephone Encounter (Signed)
Left message on voicemail for patient to call and schedule appointment with Dr Redmond School.

## 2018-03-28 NOTE — Telephone Encounter (Signed)
Patient was given the [facility] Information form below as well as the Instructions for Pre-Admission Testing form & a map of [facility].   [place], [place], [place] [address] Telephone: [phone] Fax: [phone]   Thank you for choosing [facility] for your upcoming surgery!  We are always here to assist in your urological needs.  Please read the following information with specific details for your upcoming appointments related to your surgery. Please contact Kazmir Oki at [phone] Option 3 with any questions.  The Name of Your Surgery: Right ureteroscopy, laser lithotripsy, stone removal, ureteral stent placement Your Surgery Date: 03/31/2018 Your Surgeon: Nickolas Madrid  Please call Same Day Surgery at [phone] between the hours of 1pm-3pm one day prior to your surgery. They will inform you of the time to arrive at Same Day Surgery which is located on the second floor of the [facility].   Please refer to the attached letter regarding instructions for Pre-Admission Testing. You will receive a call from the [place] office regarding your appointment with them.  The Pre-Admission Testing office is located at [place], on the first floor of the [place] [facility] in [place] (office is to the right as you enter through the [employer] of the [facility]). Please have all medications you are currently taking and your insurance card available.   Patient was advised to have nothing to eat or drink after midnight the night prior to surgery except that she may have only water until 2 hours before surgery with nothing to drink within 2 hours of surgery.  The patient states she currently takes no blood thinners. Patient's questions were answered and she expressed understanding of these instructions.

## 2018-03-29 ENCOUNTER — Other Ambulatory Visit: Payer: Self-pay | Admitting: Urology

## 2018-03-29 ENCOUNTER — Telehealth: Payer: Self-pay | Admitting: Radiology

## 2018-03-29 ENCOUNTER — Other Ambulatory Visit: Payer: Self-pay | Admitting: Radiology

## 2018-03-29 DIAGNOSIS — N200 Calculus of kidney: Secondary | ICD-10-CM

## 2018-03-29 NOTE — Telephone Encounter (Signed)
Patient states she is unable to come to the office today due to work conflict. Faxed lab requisitions to patient at [phone] per patient request to be done at a [facility] facility today.

## 2018-03-29 NOTE — Telephone Encounter (Signed)
LMOM to return call. Patient must come to the office for a urine culture today, 03/29/2018.

## 2018-03-31 ENCOUNTER — Ambulatory Visit
Admission: RE | Admit: 2018-03-31 | Discharge: 2018-03-31 | Disposition: A | Discharge status: Home / Self Care | Payer: Managed Care, Other (non HMO) | Attending: Urology | Admitting: Urology

## 2018-03-31 ENCOUNTER — Telehealth: Payer: Self-pay | Admitting: Urology

## 2018-03-31 ENCOUNTER — Ambulatory Visit: Payer: Managed Care, Other (non HMO) | Admitting: Anesthesiology

## 2018-03-31 ENCOUNTER — Ambulatory Visit: Payer: Managed Care, Other (non HMO) | Admitting: Family Medicine

## 2018-03-31 ENCOUNTER — Other Ambulatory Visit: Payer: Self-pay

## 2018-03-31 ENCOUNTER — Encounter: Payer: Self-pay | Admitting: *Deleted

## 2018-03-31 ENCOUNTER — Encounter
Admission: RE | Disposition: A | Discharge status: Home / Self Care | Payer: Self-pay | Source: Home / Self Care | Attending: Urology

## 2018-03-31 DIAGNOSIS — G4730 Sleep apnea, unspecified: Secondary | ICD-10-CM | POA: Diagnosis not present

## 2018-03-31 DIAGNOSIS — N200 Calculus of kidney: Secondary | ICD-10-CM

## 2018-03-31 DIAGNOSIS — Z6835 Body mass index (BMI) 35.0-35.9, adult: Secondary | ICD-10-CM | POA: Insufficient documentation

## 2018-03-31 HISTORY — PX: CYSTOSCOPY/URETEROSCOPY/HOLMIUM LASER/STENT PLACEMENT: SHX6546

## 2018-03-31 HISTORY — PX: CYSTOSCOPY W/ RETROGRADES: SHX1426

## 2018-03-31 SURGERY — CYSTOSCOPY/URETEROSCOPY/HOLMIUM LASER/STENT PLACEMENT
Anesthesia: General | Site: Ureter | Laterality: Right

## 2018-03-31 MED ORDER — FENTANYL CITRATE (PF) 100 MCG/2ML IJ SOLN
INTRAMUSCULAR | Status: AC
  Filled 2018-03-31: qty 2

## 2018-03-31 MED ORDER — LIDOCAINE HCL (CARDIAC) PF 100 MG/5ML IV SOSY
PREFILLED_SYRINGE | INTRAVENOUS | Status: DC | PRN
  Administered 2018-03-31: 100 mg via INTRAVENOUS

## 2018-03-31 MED ORDER — SUGAMMADEX SODIUM 200 MG/2ML IV SOLN
INTRAVENOUS | Status: AC
  Filled 2018-03-31: qty 2

## 2018-03-31 MED ORDER — MIDAZOLAM HCL 2 MG/2ML IJ SOLN
INTRAMUSCULAR | Status: DC | PRN
  Administered 2018-03-31: 2 mg via INTRAVENOUS

## 2018-03-31 MED ORDER — ONDANSETRON HCL 4 MG/2ML IJ SOLN
INTRAMUSCULAR | Status: DC | PRN
  Administered 2018-03-31: 4 mg via INTRAVENOUS

## 2018-03-31 MED ORDER — LIDOCAINE HCL (PF) 2 % IJ SOLN
INTRAMUSCULAR | Status: AC
  Filled 2018-03-31: qty 10

## 2018-03-31 MED ORDER — HYDROCODONE-ACETAMINOPHEN 5-325 MG PO TABS
1.0000 | ORAL_TABLET | ORAL | Status: DC | PRN
  Administered 2018-03-31: 1 via ORAL

## 2018-03-31 MED ORDER — PROPOFOL 10 MG/ML IV BOLUS
INTRAVENOUS | Status: AC
  Filled 2018-03-31: qty 40

## 2018-03-31 MED ORDER — OXYCODONE-ACETAMINOPHEN 5-325 MG PO TABS: 1.0000 | ORAL_TABLET | ORAL | 0 refills | Status: DC | PRN

## 2018-03-31 MED ORDER — SUCCINYLCHOLINE CHLORIDE 20 MG/ML IJ SOLN
INTRAMUSCULAR | Status: DC | PRN
  Administered 2018-03-31: 100 mg via INTRAVENOUS

## 2018-03-31 MED ORDER — ONDANSETRON HCL 4 MG/2ML IJ SOLN
INTRAMUSCULAR | Status: AC
  Filled 2018-03-31: qty 2

## 2018-03-31 MED ORDER — OXYBUTYNIN CHLORIDE ER 10 MG PO TB24: 10.0000 mg | ORAL_TABLET | Freq: Every day | ORAL | 0 refills | Status: AC | PRN

## 2018-03-31 MED ORDER — CEFAZOLIN SODIUM-DEXTROSE 2-4 GM/100ML-% IV SOLN
INTRAVENOUS | Status: AC
  Filled 2018-03-31: qty 100

## 2018-03-31 MED ORDER — HYDROCODONE-ACETAMINOPHEN 5-325 MG PO TABS
ORAL_TABLET | ORAL | Status: AC
  Filled 2018-03-31: qty 1

## 2018-03-31 MED ORDER — TAMSULOSIN HCL 0.4 MG PO CAPS: 0.4000 mg | ORAL_CAPSULE | Freq: Every day | ORAL | 0 refills | Status: DC

## 2018-03-31 MED ORDER — BELLADONNA ALKALOIDS-OPIUM 16.2-60 MG RE SUPP
RECTAL | Status: AC
  Filled 2018-03-31: qty 1

## 2018-03-31 MED ORDER — IOHEXOL 180 MG/ML  SOLN
INTRAMUSCULAR | Status: DC | PRN
  Administered 2018-03-31: 20 mL

## 2018-03-31 MED ORDER — HYDROCODONE-ACETAMINOPHEN 5-325 MG PO TABS: 1.0000 | ORAL_TABLET | ORAL | 0 refills | Status: AC | PRN

## 2018-03-31 MED ORDER — PROMETHAZINE HCL 25 MG/ML IJ SOLN
INTRAMUSCULAR | Status: AC
  Filled 2018-03-31: qty 1

## 2018-03-31 MED ORDER — PROPOFOL 500 MG/50ML IV EMUL
INTRAVENOUS | Status: DC | PRN
  Administered 2018-03-31: 20 ug/kg/min via INTRAVENOUS

## 2018-03-31 MED ORDER — ROCURONIUM BROMIDE 50 MG/5ML IV SOLN
INTRAVENOUS | Status: AC
  Filled 2018-03-31: qty 1

## 2018-03-31 MED ORDER — LACTATED RINGERS IV SOLN
INTRAVENOUS | Status: DC
  Administered 2018-03-31 (×2): via INTRAVENOUS

## 2018-03-31 MED ORDER — SCOPOLAMINE 1 MG/3DAYS TD PT72
MEDICATED_PATCH | TRANSDERMAL | Status: AC
  Filled 2018-03-31: qty 1

## 2018-03-31 MED ORDER — PROMETHAZINE HCL 25 MG/ML IJ SOLN
6.2500 mg | Freq: Once | INTRAMUSCULAR | Status: AC
  Administered 2018-03-31: 6.25 mg via INTRAVENOUS

## 2018-03-31 MED ORDER — ROCURONIUM BROMIDE 100 MG/10ML IV SOLN
INTRAVENOUS | Status: DC | PRN
  Administered 2018-03-31: 10 mg via INTRAVENOUS
  Administered 2018-03-31: 30 mg via INTRAVENOUS

## 2018-03-31 MED ORDER — SUCCINYLCHOLINE CHLORIDE 20 MG/ML IJ SOLN
INTRAMUSCULAR | Status: AC
  Filled 2018-03-31: qty 1

## 2018-03-31 MED ORDER — ONDANSETRON HCL 4 MG/2ML IJ SOLN
4.0000 mg | Freq: Once | INTRAMUSCULAR | Status: AC | PRN
  Administered 2018-03-31: 4 mg via INTRAVENOUS

## 2018-03-31 MED ORDER — KETOROLAC TROMETHAMINE 10 MG PO TABS: 10.0000 mg | ORAL_TABLET | Freq: Four times a day (QID) | ORAL | 0 refills | Status: DC | PRN

## 2018-03-31 MED ORDER — FENTANYL CITRATE (PF) 100 MCG/2ML IJ SOLN
25.0000 ug | INTRAMUSCULAR | Status: DC | PRN
  Administered 2018-03-31 (×3): 25 ug via INTRAVENOUS
  Administered 2018-03-31: 50 ug via INTRAVENOUS

## 2018-03-31 MED ORDER — BELLADONNA ALKALOIDS-OPIUM 16.2-60 MG RE SUPP
RECTAL | Status: DC | PRN
  Administered 2018-03-31: 1 via RECTAL

## 2018-03-31 MED ORDER — KETOROLAC TROMETHAMINE 15.75 MG/SPRAY NA SOLN: 1.0000 | Freq: Four times a day (QID) | NASAL | 0 refills | Status: DC

## 2018-03-31 MED ORDER — FENTANYL CITRATE (PF) 100 MCG/2ML IJ SOLN
INTRAMUSCULAR | Status: DC | PRN
  Administered 2018-03-31 (×4): 50 ug via INTRAVENOUS

## 2018-03-31 MED ORDER — SUGAMMADEX SODIUM 200 MG/2ML IV SOLN
INTRAVENOUS | Status: DC | PRN
  Administered 2018-03-31: 400 mg via INTRAVENOUS

## 2018-03-31 MED ORDER — CEFAZOLIN SODIUM-DEXTROSE 2-4 GM/100ML-% IV SOLN
2.0000 g | INTRAVENOUS | Status: AC
  Administered 2018-03-31: 2 g via INTRAVENOUS

## 2018-03-31 MED ORDER — HYDROMORPHONE HCL 1 MG/ML IJ SOLN
0.2500 mg | INTRAMUSCULAR | Status: DC | PRN
  Administered 2018-03-31: 0.25 mg via INTRAVENOUS

## 2018-03-31 MED ORDER — HYDROMORPHONE HCL 1 MG/ML IJ SOLN
INTRAMUSCULAR | Status: AC
  Filled 2018-03-31: qty 1

## 2018-03-31 MED ORDER — KETOROLAC TROMETHAMINE 30 MG/ML IJ SOLN
INTRAMUSCULAR | Status: AC
  Filled 2018-03-31: qty 1

## 2018-03-31 MED ORDER — DEXMEDETOMIDINE HCL 200 MCG/2ML IV SOLN
INTRAVENOUS | Status: DC | PRN
  Administered 2018-03-31: 8 ug via INTRAVENOUS

## 2018-03-31 MED ORDER — DEXMEDETOMIDINE HCL IN NACL 200 MCG/50ML IV SOLN
INTRAVENOUS | Status: AC
  Filled 2018-03-31: qty 50

## 2018-03-31 MED ORDER — PHENYLEPHRINE HCL 10 MG/ML IJ SOLN
INTRAMUSCULAR | Status: DC | PRN
  Administered 2018-03-31: 50 ug via INTRAVENOUS

## 2018-03-31 MED ORDER — DEXAMETHASONE SODIUM PHOSPHATE 10 MG/ML IJ SOLN
INTRAMUSCULAR | Status: AC
  Filled 2018-03-31: qty 1

## 2018-03-31 MED ORDER — MIDAZOLAM HCL 2 MG/2ML IJ SOLN
INTRAMUSCULAR | Status: AC
  Filled 2018-03-31: qty 2

## 2018-03-31 MED ORDER — PROPOFOL 10 MG/ML IV BOLUS
INTRAVENOUS | Status: DC | PRN
  Administered 2018-03-31: 140 mg via INTRAVENOUS

## 2018-03-31 MED ORDER — SCOPOLAMINE 1 MG/3DAYS TD PT72
1.0000 | MEDICATED_PATCH | TRANSDERMAL | Status: DC
  Administered 2018-03-31: 1.5 mg via TRANSDERMAL

## 2018-03-31 MED ORDER — SULFAMETHOXAZOLE-TRIMETHOPRIM 800-160 MG PO TABS: 1.0000 | ORAL_TABLET | Freq: Every day | ORAL | 0 refills | Status: DC

## 2018-03-31 MED ORDER — KETOROLAC TROMETHAMINE 30 MG/ML IJ SOLN
INTRAMUSCULAR | Status: DC | PRN
  Administered 2018-03-31: 15 mg via INTRAVENOUS

## 2018-03-31 MED ORDER — DEXAMETHASONE SODIUM PHOSPHATE 10 MG/ML IJ SOLN
INTRAMUSCULAR | Status: DC | PRN
  Administered 2018-03-31: 10 mg via INTRAVENOUS

## 2018-03-31 MED ORDER — SODIUM CHLORIDE FLUSH 0.9 % IV SOLN
INTRAVENOUS | Status: AC
  Filled 2018-03-31: qty 10

## 2018-03-31 SURGICAL SUPPLY — 35 items
BAG DRAIN CYSTO-URO LG1000N (MISCELLANEOUS) ×2 IMPLANT
BASKET ZERO TIP 1.9FR (BASKET) ×1 IMPLANT
BRUSH SCRUB EZ 1% IODOPHOR (MISCELLANEOUS) ×2 IMPLANT
BSKT STON RTRVL ZERO TP 1.9FR (BASKET) ×1
BULB IRRIG PATHFIND (MISCELLANEOUS) IMPLANT
CATH URETL 5X70 OPEN END (CATHETERS) IMPLANT
CNTNR SPEC 2.5X3XGRAD LEK (MISCELLANEOUS) ×1
CONT SPEC 4OZ STER OR WHT (MISCELLANEOUS) ×1
CONT SPEC 4OZ STRL OR WHT (MISCELLANEOUS) ×1
CONTAINER SPEC 2.5X3XGRAD LEK (MISCELLANEOUS) IMPLANT
DRAPE UTILITY 15X26 TOWEL STRL (DRAPES) ×2 IMPLANT
FIBER LASER LITHO 273 (Laser) ×1 IMPLANT
GLOVE BIOGEL PI IND STRL 7.5 (GLOVE) ×1 IMPLANT
GLOVE BIOGEL PI INDICATOR 7.5 (GLOVE) ×1
GOWN STRL REUS W/ TWL LRG LVL3 (GOWN DISPOSABLE) ×1 IMPLANT
GOWN STRL REUS W/ TWL XL LVL3 (GOWN DISPOSABLE) ×1 IMPLANT
GOWN STRL REUS W/TWL LRG LVL3 (GOWN DISPOSABLE) ×2
GOWN STRL REUS W/TWL XL LVL3 (GOWN DISPOSABLE) ×2
GUIDEWIRE STR DUAL SENSOR (WIRE) ×3 IMPLANT
INFUSOR MANOMETER BAG 3000ML (MISCELLANEOUS) ×2 IMPLANT
INTRODUCER DILATOR DOUBLE (INTRODUCER) IMPLANT
KIT TURNOVER CYSTO (KITS) ×2 IMPLANT
PACK CYSTO AR (MISCELLANEOUS) ×2 IMPLANT
SET CYSTO W/LG BORE CLAMP LF (SET/KITS/TRAYS/PACK) ×2 IMPLANT
SHEATH URETERAL 12FRX35CM (MISCELLANEOUS) IMPLANT
SOL .9 NS 3000ML IRR  AL (IV SOLUTION) ×1
SOL .9 NS 3000ML IRR AL (IV SOLUTION) ×1
SOL .9 NS 3000ML IRR UROMATIC (IV SOLUTION) ×1 IMPLANT
STENT URET 6FRX24 CONTOUR (STENTS) ×1 IMPLANT
STENT URET 6FRX26 CONTOUR (STENTS) IMPLANT
SURGILUBE 2OZ TUBE FLIPTOP (MISCELLANEOUS) ×2 IMPLANT
SYR 10ML LL (SYRINGE) ×2 IMPLANT
TUBING ART PRESS 48 MALE/FEM (TUBING) IMPLANT
VALVE UROSEAL ADJ ENDO (VALVE) ×1 IMPLANT
WATER STERILE IRR 1000ML POUR (IV SOLUTION) ×2 IMPLANT

## 2018-03-31 NOTE — Progress Notes (Signed)
Boyfriend at bs.  Pt con't to stress fear of possible pain like "last time" when she goes home.  Pain level 4/10, and states pain has eased off but she is afraid of what happened "six months ago happening again".  Dr. Diamantina Providence paged again to relay information of pt's concerns.  Per Dr. Diamantina Providence, he will call in Percocet instead of previously ordered hydrocodone as well as Toradol for home use.  If she has severe pain over the weekend, he will be glad to remove the stent on Monday.  Reassurance given to pt.  Explanation of all meds and plan of treatment explained.  Pt cont to express frustration as to how her procedure has gone.  We asked "what would you like for [place] to do for you to make it better?"  Pt states  "I just want to go home." and throws her hands up.  Second RN at bedside attempting to offer reassurance, but pt continues to just want to go home. Informed pt and boyfriend Dr. Erlene Quan is on call this weekend and to call with any concerns.  Bubba Camp, RN

## 2018-03-31 NOTE — Transfer of Care (Signed)
Immediate Anesthesia Transfer of Care Note  Patient: Kayla Sexton  Procedure(s) Performed: CYSTOSCOPY/URETEROSCOPY/HOLMIUM LASER/STENT PLACEMENT (Right Ureter) CYSTOSCOPY WITH RETROGRADE PYELOGRAM (Right Ureter)  Patient Location: PACU  Anesthesia Type:General  Level of Consciousness: drowsy  Airway & Oxygen Therapy: Patient Spontanous Breathing and Patient connected to face mask oxygen  Post-op Assessment: Report given to RN and Post -op Vital signs reviewed and stable  Post vital signs: stable  Last Vitals:  Vitals Value Taken Time  BP    Temp    Pulse 103 03/31/2018  4:13 PM  Resp 20 03/31/2018  4:13 PM  SpO2 100 % 03/31/2018  4:13 PM  Vitals shown include unvalidated device data.  Last Pain:  Vitals:   03/31/18 1255  TempSrc: Oral  PainSc: 0-No pain         Complications: No apparent anesthesia complications

## 2018-03-31 NOTE — Progress Notes (Signed)
Pt into phase 2 at this time , pt keeps stating that she just wants to go home, pt and boyfriend expressing concern over possible pain when she gets home, dc instructions given , pt states she has been here and done this before.Patient and boyfriend made aware that Dr Erlene Quan is on call and that her discharge instructions state to call if pain meds not effective.Pt states" I just want to go home". Pt in wheelchair and discharged with boyfriend

## 2018-03-31 NOTE — Anesthesia Postprocedure Evaluation (Signed)
Anesthesia Post Note  Patient: Kayla Sexton  Procedure(s) Performed: CYSTOSCOPY/URETEROSCOPY/HOLMIUM LASER/STENT PLACEMENT (Right Ureter) CYSTOSCOPY WITH RETROGRADE PYELOGRAM (Right Ureter)  Patient location during evaluation: PACU Anesthesia Type: General Level of consciousness: awake and alert Pain management: pain level controlled Vital Signs Assessment: post-procedure vital signs reviewed and stable Respiratory status: spontaneous breathing, nonlabored ventilation, respiratory function stable and patient connected to nasal cannula oxygen Cardiovascular status: blood pressure returned to baseline and stable Postop Assessment: no apparent nausea or vomiting Anesthetic complications: no     Last Vitals:  Vitals:   03/31/18 1822 03/31/18 1915  BP: 122/84 136/86  Pulse: 100 99  Resp: 16 18  Temp:  (!) 36.3 C  SpO2: 96% 99%    Last Pain:  Vitals:   03/31/18 1915  TempSrc: Temporal  PainSc: 4                  Martha Clan

## 2018-03-31 NOTE — Progress Notes (Signed)
Paged Dr. Diamantina Providence to discuss pt's level of pain.  Pt is tearful and stating 10/10 pain and is afraid to go home with this much pain.  Per anesthesia, start Dilaudid and per Dr. Diamantina Providence, surgery was uneventful and he felt like with proper narcotics, pain would lesson and if pt requested, he would remove the stent on Monday.  Plan is still to dc pt home later once pain is better under control.

## 2018-03-31 NOTE — Anesthesia Preprocedure Evaluation (Signed)
Anesthesia Evaluation  Patient identified by MRN, date of birth, ID band Patient awake    Reviewed: Allergy & Precautions, NPO status , Patient's Chart, lab work & pertinent test results, reviewed documented beta blocker date and time   History of Anesthesia Complications (+) PONV and history of anesthetic complications  Airway Mallampati: III  TM Distance: >3 FB     Dental  (+) Chipped   Pulmonary sleep apnea ,           Cardiovascular      Neuro/Psych  Headaches, PSYCHIATRIC DISORDERS Anxiety    GI/Hepatic hiatal hernia, GERD  Controlled,  Endo/Other  Morbid obesity  Renal/GU Renal disease     Musculoskeletal   Abdominal   Peds  Hematology   Anesthesia Other Findings ADD.EKG ok.  Reproductive/Obstetrics                             Anesthesia Physical Anesthesia Plan  ASA: III  Anesthesia Plan: General   Post-op Pain Management:    Induction: Intravenous  PONV Risk Score and Plan:   Airway Management Planned: Oral ETT  Additional Equipment:   Intra-op Plan:   Post-operative Plan:   Informed Consent: I have reviewed the patients History and Physical, chart, labs and discussed the procedure including the risks, benefits and alternatives for the proposed anesthesia with the patient or authorized representative who has indicated his/her understanding and acceptance.       Plan Discussed with: CRNA  Anesthesia Plan Comments:         Anesthesia Quick Evaluation

## 2018-03-31 NOTE — Telephone Encounter (Signed)
App made 

## 2018-03-31 NOTE — H&P (Signed)
Kayla Sexton [birth year] [id number]  HPI: The patient is a 38 year old relatively healthy female with history of recurrent nephrolithiasis.  She previously underwent left ureteroscopy, laser lithotripsy, and stent placement on the left side for an obstructing ureteral stone.  All stones on the left side were treated at that time.  She presents today for definitive management of a right nonobstructing 8 mm renal stone.  She denies any fevers or chills.  She denies chest pain or shortness of breath.   PMH: Past Medical History:  Diagnosis Date  . CIN III (cervical intraepithelial neoplasia III)   . Dysuria   . GERD (gastroesophageal reflux disease)   . Hematuria   . HH (hiatus hernia)   . History of cervical dysplasia 2006   CIN1 and CIN2  . History of kidney stones   . Migraines   . PCOS (polycystic ovarian syndrome)    Dr. Helane Rima  . PONV (postoperative nausea and vomiting)   . Renal calculus, left   . Right ureteral calculus   . Sleep apnea     Surgical History: Past Surgical History:  Procedure Laterality Date  . CERVICAL BIOPSY  W/ LOOP ELECTRODE EXCISION  12-04-2004   dr Helane Rima  [facility]  . COLONOSCOPY  03/2008   Dr. Michail Sermon  . COMBINED HYSTEROSCOPY DIAGNOSTIC / D&C  02-28-2012   dr Helane Rima   @ [facility]  . CYSTOSCOPY WITH RETROGRADE PYELOGRAM, URETEROSCOPY AND STENT PLACEMENT Bilateral 07/21/2017   Procedure: CYSTOSCOPY WITH BILATERAL  RETROGRADE BILATERAL URETEROSCOPY AND STENTS  PLACEMENT;  Surgeon: Irine Seal, MD;  Location: [facility];  Service: Urology;  Laterality: Bilateral;  . CYSTOSCOPY/URETEROSCOPY/HOLMIUM LASER/STENT PLACEMENT Left 03/17/2018   Procedure: CYSTOSCOPY/URETEROSCOPY/HOLMIUM LASER/STENT PLACEMENT;  Surgeon: Billey Co, MD;  Location: [facility];  Service: Urology;  Laterality: Left;  . endoscopy  2010   Dr. Amedeo Plenty  . FOOT SURGERY Left 2015   5th bunionectomy w/ 2nd digit w/ fusion , retained hardware w/ 5th toe  . HOLMIUM LASER  APPLICATION Bilateral 2/97/9892   Procedure: HOLMIUM LASER APPLICATION;  Surgeon: Irine Seal, MD;  Location: [facility];  Service: Urology;  Laterality: Bilateral;    Allergies:  Allergies  Allergen Reactions  . Alpha-Gal Anaphylaxis and Hives  . Adhesive [Tape]     Per pt only the 12 lead ekg pads cause "welps and abrasions"  . Vancomycin Hives and Itching    Family History: History reviewed. No pertinent family history.  Social History:  reports that she has never smoked. She has never used smokeless tobacco. She reports that she does not drink alcohol or use drugs.  ROS: Please see flowsheet from today's date for complete review of systems.  Physical Exam: BP (!) 122/92   Pulse 85   Temp 97.9 F (36.6 C) (Oral)   Resp 16   Ht 5\' 2"  (1.575 m)   Wt 88.5 kg   SpO2 98%   BMI 35.69 kg/m    Constitutional:  Alert and oriented, No acute distress. Cardiovascular: Regular rate and rhythm Respiratory: Clear to auscultation bilaterally GI: Abdomen is soft, nontender, nondistended, no abdominal masses Lymph: No cervical or inguinal lymphadenopathy. Skin: No rashes, bruises or suspicious lesions. Neurologic: Grossly intact, no focal deficits, moving all 4 extremities. Psychiatric: Normal mood and affect.  Laboratory Data: Urinalysis 03/29/2018, 0-5 WBCs, 3-10 RBCs, few bacteria, nitrite negative  Pertinent Imaging: I have personally reviewed the CT stone protocol.  8 mm right lower pole renal stone.  Assessment & Plan:  In summary, the patient is a healthy 38 year old female who presents for elective management of a non-obstructing right 8 mm renal stone.  We discussed the risks and benefits of the procedure at length including bleeding, infection, stent related symptoms, inability to access the collecting system, need for possible staged procedure, need for ultimate stent removal, and ureteral injury.  Right ureteroscopy, laser lithotripsy, stent  placement   Billey Co, MD  [place] [facility] [address] [place], [place] [address] [phone]

## 2018-03-31 NOTE — Telephone Encounter (Signed)
-----   Message from Billey Co, MD sent at 03/31/2018  3:54 PM EST ----- Regarding: follow up stent removal Please schedule cysto and stent removal in 7-10 days.  Thanks Nickolas Madrid, MD 03/31/2018

## 2018-03-31 NOTE — Anesthesia Post-op Follow-up Note (Signed)
Anesthesia QCDR form completed.        

## 2018-03-31 NOTE — Progress Notes (Signed)
Pain 4/10, pt calm.  Up to bsc to void.  Boyfriend in waiting room updated as to plan of care. Reassurance given for postop care.  Bubba Camp, RN

## 2018-03-31 NOTE — Discharge Instructions (Signed)
AMBULATORY SURGERY  DISCHARGE INSTRUCTIONS   1) The drugs that you were given will stay in your system until tomorrow so for the next 24 hours you should not:  A) Drive an automobile B) Make any legal decisions C) Drink any alcoholic beverage   2) You may resume regular meals tomorrow.  Today it is better to start with liquids and gradually work up to solid foods.  You may eat anything you prefer, but it is better to start with liquids, then soup and crackers, and gradually work up to solid foods.   3) Please notify your doctor immediately if you have any unusual bleeding, trouble breathing, redness and pain at the surgery site, drainage, fever, or pain not relieved by medication.    4) Additional Instructions:pickl up new ,meds per Dr Diamantina Providence , call Dr Erlene Quan if meeded this weekend        Please contact your physician with any problems or Same Day Surgery at [phone], Monday through Friday 6 am to 4 pm, or [place] at [facility] number at [phone].

## 2018-03-31 NOTE — Op Note (Signed)
Date of procedure: 03/31/18  Preoperative diagnosis:  1. Right 1 cm renal stone  Postoperative diagnosis:  1. Same  Procedure: 1. Cystoscopy, right retrograde pyelogram with intraoperative interpretation, right ureteroscopy, laser lithotripsy, stent placement  Surgeon: Nickolas Madrid, MD  Anesthesia: General  Complications: None  Intraoperative findings:  1.  Normal cystoscopy 2.  Large lower pole stone basketed and moved to upper pole, then fragmented to dust 3.  Uncomplicated right 6 [place] by 24 cm stent placement, shepherd's hook in right lower pole as patient tolerates shorter stents much better  EBL: Minimal  Specimens: None  Drains: Right 6 French by 24 cm ureteral stent  Indication: Kayla Sexton is a 38 y.o. patient with long history of nephrolithiasis, and recent left ureteroscopy for obstructing ureteral stones.  She had a 1 cm right lower pole stone and elected for definitive management with right ureteroscopy.  After reviewing the management options for treatment, they elected to proceed with the above surgical procedure(s). We have discussed the potential benefits and risks of the procedure, side effects of the proposed treatment, the likelihood of the patient achieving the goals of the procedure, and any potential problems that might occur during the procedure or recuperation. Informed consent has been obtained.  Description of procedure:  The patient was taken to the operating room and general anesthesia was induced. SCDs were placed for DVT prophylaxis. The patient was placed in the dorsal lithotomy position, prepped and draped in the usual sterile fashion, and preoperative antibiotics were administered. A preoperative time-out was performed.   A 21 French rigid cystoscope was used to intubate the urethra.  Cystoscopy was normal.  A sensor wire was advanced into the right ureteral orifice and advanced up to the renal pelvis under fluoroscopic vision.  The stone was  clearly visible on fluoroscopy.  A single channel digital flexible ureteroscope was advanced over the wire into the collecting system.  It is very difficult to navigate into the lower pole and we were only just able to see the large stone.  An encircle basket was used to basket the stone and move this to the upper pole to aid in fragmentation.  A 270 m laser fiber was advanced to the scope and on settings of 0.3 J and 30 Hz the stone was fragmented to <1 mm fragments.  Thorough pyeloscopy revealed no residual significant fragments at the conclusion of the case.  Contrast was injected through the scope and opacified the collecting system with no filling defects noted.  A sensor wire was advanced through the scope and pullback ureteroscopy revealed no ureteral injury or residual fragments.  A 6 French by 24 cm ureteral stent was fluoroscopically placed over the wire with a shepherd's hook in the lower pole, and an excellent curl in the bladder.  Cystoscopy was performed and showed excellent drainage of contrast through the stent and a good curl in the bladder.  Disposition: Stable to PACU  Plan: Bactrim prophylaxis while stent in place Flomax and oxybutynin for stent pain Follow-up in 1 week for stent removal 24-hour urine evaluation in follow-up  Nickolas Madrid, MD

## 2018-03-31 NOTE — Anesthesia Procedure Notes (Signed)
Procedure Name: Intubation Date/Time: 03/31/2018 3:02 PM Performed by: Lavone Orn, CRNA Pre-anesthesia Checklist: Patient identified, Emergency Drugs available, Suction available, Patient being monitored and Timeout performed Patient Re-evaluated:Patient Re-evaluated prior to induction Oxygen Delivery Method: Circle system utilized Preoxygenation: Pre-oxygenation with 100% oxygen Induction Type: IV induction Ventilation: Mask ventilation without difficulty and Oral airway inserted - appropriate to patient size Laryngoscope Size: Mac and 3 Grade View: Grade II Tube type: Oral Tube size: 7.0 mm Number of attempts: 1 Airway Equipment and Method: Stylet Placement Confirmation: ETT inserted through vocal cords under direct vision,  positive ETCO2 and breath sounds checked- equal and bilateral Secured at: 21 (At lip) cm Tube secured with: Tape Dental Injury: Teeth and Oropharynx as per pre-operative assessment

## 2018-04-01 LAB — CULTURE, URINE COMPREHENSIVE

## 2018-04-01 LAB — MICROSCOPIC EXAMINATION: CASTS: NONE SEEN /LPF

## 2018-04-01 LAB — URINALYSIS, COMPLETE
Bilirubin, UA: NEGATIVE
Glucose, UA: NEGATIVE
Ketones, UA: NEGATIVE
Leukocytes, UA: NEGATIVE
Nitrite, UA: NEGATIVE
Specific Gravity, UA: 1.022 (ref 1.005–1.030)
Urobilinogen, Ur: 0.2 mg/dL (ref 0.2–1.0)
pH, UA: 5 (ref 5.0–7.5)

## 2018-04-02 ENCOUNTER — Encounter: Payer: Self-pay | Admitting: Urology

## 2018-04-03 ENCOUNTER — Other Ambulatory Visit: Payer: Self-pay | Admitting: Family Medicine

## 2018-04-03 LAB — CALCULI, WITH PHOTOGRAPH: CALCIUM OXALATE MONOHYDRATE: 70 %

## 2018-04-03 LAB — CALCULI, WITH PHOTOGRAPH (CLINICAL LAB)
Calcium Oxalate Dihydrate: 20 %
Uric Acid Calculi: 10 %

## 2018-04-03 MED ORDER — KETOROLAC TROMETHAMINE 15.75 MG/SPRAY NA SOLN: 1.0000 | Freq: Four times a day (QID) | NASAL | 0 refills | Status: DC

## 2018-04-04 ENCOUNTER — Emergency Department
Admission: EM | Admit: 2018-04-04 | Discharge: 2018-04-04 | Disposition: A | Discharge status: Home / Self Care | Payer: Managed Care, Other (non HMO) | Attending: Emergency Medicine | Admitting: Emergency Medicine

## 2018-04-04 ENCOUNTER — Encounter: Payer: Self-pay | Admitting: Emergency Medicine

## 2018-04-04 ENCOUNTER — Emergency Department: Payer: Managed Care, Other (non HMO)

## 2018-04-04 ENCOUNTER — Other Ambulatory Visit: Payer: Self-pay

## 2018-04-04 ENCOUNTER — Encounter: Payer: Self-pay | Admitting: Urology

## 2018-04-04 ENCOUNTER — Ambulatory Visit (INDEPENDENT_AMBULATORY_CARE_PROVIDER_SITE_OTHER): Payer: Managed Care, Other (non HMO) | Admitting: Urology

## 2018-04-04 ENCOUNTER — Telehealth: Payer: Self-pay | Admitting: Urology

## 2018-04-04 VITALS — BP 128/88 | HR 91 | Ht 62.0 in | Wt 191.4 lb

## 2018-04-04 DIAGNOSIS — N200 Calculus of kidney: Secondary | ICD-10-CM | POA: Diagnosis not present

## 2018-04-04 DIAGNOSIS — R109 Unspecified abdominal pain: Secondary | ICD-10-CM | POA: Diagnosis present

## 2018-04-04 DIAGNOSIS — N23 Unspecified renal colic: Secondary | ICD-10-CM | POA: Diagnosis not present

## 2018-04-04 DIAGNOSIS — Z79899 Other long term (current) drug therapy: Secondary | ICD-10-CM | POA: Diagnosis not present

## 2018-04-04 LAB — BASIC METABOLIC PANEL
Anion gap: 10 (ref 5–15)
BUN: 13 mg/dL (ref 6–20)
CO2: 26 mmol/L (ref 22–32)
Calcium: 8.5 mg/dL — ABNORMAL LOW (ref 8.9–10.3)
Chloride: 102 mmol/L (ref 98–111)
Creatinine, Ser: 1.26 mg/dL — ABNORMAL HIGH (ref 0.44–1.00)
GFR calc Af Amer: >60 mL/min (ref >60)
GFR calc non Af Amer: 54 mL/min — ABNORMAL LOW (ref >60)
GLUCOSE: 115 mg/dL — AB (ref 70–99)
Potassium: 3.7 mmol/L (ref 3.5–5.1)
Sodium: 138 mmol/L (ref 135–145)

## 2018-04-04 LAB — CBC WITH DIFFERENTIAL/PLATELET
Abs Immature Granulocytes: 0.02 10*3/uL (ref 0.00–0.07)
Basophils Absolute: 0 10*3/uL (ref 0.0–0.1)
Basophils Relative: 0 %
Eosinophils Absolute: 0.3 10*3/uL (ref 0.0–0.5)
Eosinophils Relative: 3 %
HCT: 41.4 % (ref 36.0–46.0)
Hemoglobin: 13.4 g/dL (ref 12.0–15.0)
Immature Granulocytes: 0 %
Lymphocytes Relative: 42 %
Lymphs Abs: 4.3 10*3/uL — ABNORMAL HIGH (ref 0.7–4.0)
MCH: 27.6 pg (ref 26.0–34.0)
MCHC: 32.4 g/dL (ref 30.0–36.0)
MCV: 85.4 fL (ref 80.0–100.0)
MONOS PCT: 8 %
Monocytes Absolute: 0.8 10*3/uL (ref 0.1–1.0)
Neutro Abs: 4.8 10*3/uL (ref 1.7–7.7)
Neutrophils Relative %: 47 %
Platelets: 375 10*3/uL (ref 150–400)
RBC: 4.85 MIL/uL (ref 3.87–5.11)
RDW: 13.7 % (ref 11.5–15.5)
WBC: 10.2 10*3/uL (ref 4.0–10.5)
nRBC: 0 % (ref 0.0–0.2)

## 2018-04-04 LAB — POCT PREGNANCY, URINE: Preg Test, Ur: NEGATIVE

## 2018-04-04 MED ORDER — CIPROFLOXACIN HCL 500 MG PO TABS: 500.0000 mg | ORAL_TABLET | Freq: Two times a day (BID) | ORAL | 0 refills | Status: DC

## 2018-04-04 MED ORDER — SODIUM CHLORIDE 0.9 % IV BOLUS
1000.0000 mL | Freq: Once | INTRAVENOUS | Status: AC
  Administered 2018-04-04: 1000 mL via INTRAVENOUS

## 2018-04-04 MED ORDER — ONDANSETRON HCL 4 MG/2ML IJ SOLN
4.0000 mg | Freq: Once | INTRAMUSCULAR | Status: AC
  Administered 2018-04-04: 4 mg via INTRAVENOUS
  Filled 2018-04-04: qty 2

## 2018-04-04 MED ORDER — OXYCODONE-ACETAMINOPHEN 5-325 MG PO TABS: 1.0000 | ORAL_TABLET | ORAL | 0 refills | Status: DC | PRN

## 2018-04-04 MED ORDER — MORPHINE SULFATE (PF) 4 MG/ML IV SOLN
4.0000 mg | Freq: Once | INTRAVENOUS | Status: AC
  Administered 2018-04-04: 4 mg via INTRAVENOUS
  Filled 2018-04-04: qty 1

## 2018-04-04 MED ORDER — HYDROMORPHONE HCL 1 MG/ML IJ SOLN
1.0000 mg | Freq: Once | INTRAMUSCULAR | Status: AC
  Administered 2018-04-04: 1 mg via INTRAVENOUS
  Filled 2018-04-04: qty 1

## 2018-04-04 NOTE — Telephone Encounter (Signed)
Pt called and LMOM and states that she is in severe pain, nausea, vomiting. Low grade fever and can barely stand up. Hasn't been to work in 2 days. She has a stent. Please advise ASAP.

## 2018-04-04 NOTE — ED Notes (Signed)
Urine specimen obtained, urine preg NEG.

## 2018-04-04 NOTE — ED Triage Notes (Signed)
Patient ambulatory to triage with steady gait, without difficulty or distress noted; surgery on Friday for stent placement r/t kidney stone; stent removed today in office; c/o right flank pain radiating into abd since accomp by diff urinating, N/V, fever

## 2018-04-04 NOTE — ED Provider Notes (Addendum)
[facility] Emergency Department Provider Note  ____________________________________________   I have reviewed the triage vital signs and the nursing notes. Where available I have reviewed prior notes and, if possible and indicated, outside hospital notes.    HISTORY  Chief Complaint Post-op Problem    HPI Kayla Sexton is a 38 y.o. female  Who presents today complaining of right-sided pain, patient had a kidney stone stent removed today, she is been in "excruciating" pain since before it happened and the pain only seems to have progressed.  Patient has had nausea and vomiting she states.  She feels worse than she did before the stent was placed.  She had some degree of a sensation that she was warm, but no documented fever.  She has had pain on the right side and she generally feels unwell.  She states has been feeling quite unwell since the procedure.  She states it took him 4 hours to get her pain under control after the procedure and its been occult for her since that time.  She is compliant with a home medications that she is on.   She denies any dysuria or urinary frequency she is making less urine.  Past Medical History:  Diagnosis Date  . CIN III (cervical intraepithelial neoplasia III)   . Dysuria   . GERD (gastroesophageal reflux disease)   . Hematuria   . HH (hiatus hernia)   . History of cervical dysplasia 2006   CIN1 and CIN2  . History of kidney stones   . Migraines   . PCOS (polycystic ovarian syndrome)    Dr. Helane Rima  . PONV (postoperative nausea and vomiting)   . Renal calculus, left   . Right ureteral calculus   . Sleep apnea     Patient Active Problem List   Diagnosis Date Noted  . OSA (obstructive sleep apnea) 12/26/2017  . Obesity (BMI 30-39.9) 11/21/2017  . ADD (attention deficit disorder) 03/17/2011    Past Surgical History:  Procedure Laterality Date  . CERVICAL BIOPSY  W/ LOOP ELECTRODE EXCISION  12-04-2004   dr Helane Rima   [facility]  . COLONOSCOPY  03/2008   Dr. Michail Sermon  . COMBINED HYSTEROSCOPY DIAGNOSTIC / D&C  02-28-2012   dr Helane Rima   @ [facility]  . CYSTOSCOPY W/ RETROGRADES Right 03/31/2018   Procedure: CYSTOSCOPY WITH RETROGRADE PYELOGRAM;  Surgeon: Billey Co, MD;  Location: [facility];  Service: Urology;  Laterality: Right;  . CYSTOSCOPY WITH RETROGRADE PYELOGRAM, URETEROSCOPY AND STENT PLACEMENT Bilateral 07/21/2017   Procedure: CYSTOSCOPY WITH BILATERAL  RETROGRADE BILATERAL URETEROSCOPY AND STENTS  PLACEMENT;  Surgeon: Irine Seal, MD;  Location: [facility];  Service: Urology;  Laterality: Bilateral;  . CYSTOSCOPY/URETEROSCOPY/HOLMIUM LASER/STENT PLACEMENT Left 03/17/2018   Procedure: CYSTOSCOPY/URETEROSCOPY/HOLMIUM LASER/STENT PLACEMENT;  Surgeon: Billey Co, MD;  Location: [facility];  Service: Urology;  Laterality: Left;  . CYSTOSCOPY/URETEROSCOPY/HOLMIUM LASER/STENT PLACEMENT Right 03/31/2018   Procedure: CYSTOSCOPY/URETEROSCOPY/HOLMIUM LASER/STENT PLACEMENT;  Surgeon: Billey Co, MD;  Location: [facility];  Service: Urology;  Laterality: Right;  . endoscopy  2010   Dr. Amedeo Plenty  . FOOT SURGERY Left 2015   5th bunionectomy w/ 2nd digit w/ fusion , retained hardware w/ 5th toe  . HOLMIUM LASER APPLICATION Bilateral 8/93/7342   Procedure: HOLMIUM LASER APPLICATION;  Surgeon: Irine Seal, MD;  Location: [facility];  Service: Urology;  Laterality: Bilateral;    Prior to Admission medications   Medication Sig Start Date End Date Taking? Authorizing Provider  Aspirin-Acetaminophen-Caffeine (  EXCEDRIN MIGRAINE PO) Take 2 tablets by mouth daily as needed (migraines).     [provider]  ciprofloxacin (CIPRO) 500 MG tablet Take 1 tablet (500 mg total) by mouth 2 (two) times daily. 04/04/18   Billey Co, MD  diphenhydrAMINE (BENADRYL) 25 MG tablet Take 25 mg by mouth daily as needed for allergies.     [provider]  HYDROcodone-acetaminophen (NORCO/VICODIN)  5-325 MG tablet Take 1 tablet by mouth every 4 (four) hours as needed for up to 5 days for moderate pain. 03/31/18 04/05/18  Billey Co, MD  ibuprofen (ADVIL,MOTRIN) 200 MG tablet Take 400 mg by mouth every 6 (six) hours as needed for moderate pain.     [provider]  ketorolac (TORADOL) 10 MG tablet Take 1 tablet (10 mg total) by mouth every 6 (six) hours as needed. Take oral OR nasal toradol for pain, do not take both. 03/31/18   Billey Co, MD  Ketorolac Tromethamine (SPRIX) 15.75 MG/SPRAY SOLN Place 1-2 sprays into the nose every 6 (six) hours. 04/03/18   Billey Co, MD  Misc. Devices (VAGINAL SUPPOSITORY APPLICATOR) MISC by Does not apply route.    [provider]  oxybutynin (DITROPAN XL) 10 MG 24 hr tablet Take 1 tablet (10 mg total) by mouth daily as needed for up to 14 days (for bladder spasms). 03/31/18 04/14/18  Billey Co, MD  oxyCODONE-acetaminophen (PERCOCET) 5-325 MG tablet Take 1 tablet by mouth every 4 (four) hours as needed for up to 7 days for severe pain. 03/31/18 04/07/18  Billey Co, MD  tamsulosin (FLOMAX) 0.4 MG CAPS capsule Take 1 capsule (0.4 mg total) by mouth daily after supper. 03/31/18   Billey Co, MD    Allergies Alpha-gal; Adhesive [tape]; and Vancomycin  No family history on file.  Social History Social History   Tobacco Use  . Smoking status: Never Smoker  . Smokeless tobacco: Never Used  Substance Use Topics  . Alcohol use: No  . Drug use: No    Review of Systems Constitutional: No fever/chills Eyes: No visual changes. ENT: No sore throat. No stiff neck no neck pain Cardiovascular: Denies chest pain. Respiratory: Denies shortness of breath. Gastrointestinal:   + vomiting.  No diarrhea.  No constipation. Genitourinary: Negative for dysuria. Musculoskeletal: Negative lower extremity swelling Skin: Negative for rash. Neurological: Negative for severe headaches, focal weakness or  numbness.   ____________________________________________   PHYSICAL EXAM:  VITAL SIGNS: ED Triage Vitals  Enc Vitals Group     BP 04/04/18 2012 (!) 159/104     Pulse Rate 04/04/18 2012 (!) 107     Resp 04/04/18 2012 16     Temp 04/04/18 2012 98 F (36.7 C)     Temp Source 04/04/18 2012 Oral     SpO2 04/04/18 2012 97 %     Weight 04/04/18 2004 191 lb 5.8 oz (86.8 kg)     Height 04/04/18 2004 5\' 2"  (1.575 m)     Head Circumference --      Peak Flow --      Pain Score 04/04/18 2002 10     Pain Loc --      Pain Edu? --      Excl. in [place]? --     Constitutional: Alert and oriented. Well appearing and in no acute distress. Eyes: Conjunctivae are normal Head: Atraumatic HEENT: No congestion/rhinnorhea. Mucous membranes are moist.  Oropharynx non-erythematous Neck:   Nontender with no meningismus, no  masses, no stridor Cardiovascular: Normal rate, regular rhythm. Grossly normal heart sounds.  Good peripheral circulation. Respiratory: Normal respiratory effort.  No retractions. Lungs CTAB. Abdominal: Soft and mild tenderness to palpation of the right lower quadrant no surgical signs morbid obesity limits exam. No distention. No guarding no rebound Back:  There is no focal tenderness or step off.  there is no midline tenderness there are no lesions noted. there is right CVA tenderness  Musculoskeletal: No lower extremity tenderness, no upper extremity tenderness. No joint effusions, no DVT signs strong distal pulses no edema Neurologic:  Normal speech and language. No gross focal neurologic deficits are appreciated.  Skin:  Skin is warm, dry and intact. No rash noted. Psychiatric: Mood and affect are normal. Speech and behavior are normal.  ____________________________________________   LABS (all labs ordered are listed, but only abnormal results are displayed)  Labs Reviewed  URINE CULTURE  URINALYSIS, COMPLETE (UACMP) WITH MICROSCOPIC  CBC WITH DIFFERENTIAL/PLATELET  BASIC  METABOLIC PANEL  POC URINE PREG, ED    Pertinent labs  results that were available during my care of the patient were reviewed by me and considered in my medical decision making (see chart for details). ____________________________________________  EKG  I personally interpreted any EKGs ordered by me or triage  ____________________________________________  RADIOLOGY  Pertinent labs & imaging results that were available during my care of the patient were reviewed by me and considered in my medical decision making (see chart for details). If possible, patient and/or family made aware of any abnormal findings.  No results found. ____________________________________________    PROCEDURES  Procedure(s) performed: None  Procedures  Critical Care performed: None  ____________________________________________   INITIAL IMPRESSION / ASSESSMENT AND PLAN / ED COURSE  Pertinent labs & imaging results that were available during my care of the patient were reviewed by me and considered in my medical decision making (see chart for details).  Here with postop pain and nausea after having a stent removed for kidney stone.  We will obtain CT scan to make sure that there is no complicating factors from this procedure we will give her pain control IV fluids, check urinalysis, CBC BMP, patient is nontoxic in appearance is my hope that we can get her symptomatically under control and then talk to urology.  ----------------------------------------- 9:01 PM on 04/04/2018 -----------------------------------------  Urinalysis reassuring blood work reassuring kidney function slightly up, but not dangerous so BUN is reassuring, giving IV fluids, patient states she is had 0 relief from IV morphine.  We will try Dilaudid.  She does not look toxic we will hold off on Toradol till CT results   ----------------------------------------- 11:21 PM on  04/04/2018 -----------------------------------------  I discussed with Dr. Bernardo Heater, appreciate consult.  At this point, the patient's choices are admission for pain control or discharge with close outpatient follow-up with urology.  Patient is very well controlled on her pain we have been able to give her a liter of fluid here, her pain is slightly.  Patient is eager to go home at this time.  She is upset about the entire situation but she refuses offered admission she refuses more pain medication she refuses more IV fluid and she states she would like the IV pulled and she would like to go home.  I have made a point of explained to her that we are happy to do anything we can including giving more pain medications more IV fluid and even admitting her for pain control should that be what  she decided to do, however, it is not something that she feels that she can do at this time given logistics of her life and she would like to go home.  She would like me to give her another prescription for Percocet which I can do, and she has all the other medications she needs at home.  Patient will follow closely with her urologist tomorrow.  Return precautions and follow-up given understood.  And again patient declines admission.   ____________________________________________   FINAL CLINICAL IMPRESSION(S) / ED DIAGNOSES  Final diagnoses:  None      This chart was dictated using voice recognition software.  Despite best efforts to proofread,  errors can occur which can change meaning.      Schuyler Amor, MD 04/04/18 2033    Schuyler Amor, MD 04/04/18 2102    Schuyler Amor, MD 04/04/18 2322

## 2018-04-04 NOTE — Progress Notes (Signed)
Cystoscopy Procedure Note:  Indication: Stent removal s/p R URS/LL/stent for 1cm renal stone. Severe stent pain/symptoms and patient requests stent out ASAP. We discussed the risks of renal colic, hydro, infection, sepsis, and possible need for stent replacement.  After informed consent and discussion of the procedure and its risks, Kayla Sexton was positioned and prepped in the standard fashion. Cystoscopy was performed with a flexible cystoscope. The stent was grasped with flexible graspers and removed in its entirety. The patient tolerated the procedure well.  Findings: Uncomplicated stent removal  Assessment and Plan: Cipro 500mg  BID x 3 days with hx of recent low growth of Enterococcus and E Coli Follow up in 4 weeks with renal ultrasound to evaluate for silent hydronephrosis, and to review 24 hour urine results  Billey Co, MD 04/04/2018

## 2018-04-04 NOTE — ED Notes (Signed)
Reports pain/ nausea since stent removal this morning, now also having chills. Awaiting md eval.

## 2018-04-04 NOTE — ED Notes (Signed)
Lab confirmed urine is in the lab and pregnancy can be added.

## 2018-04-04 NOTE — Discharge Instructions (Signed)
You would prefer not to stay in the hospital tonight which is certainly your choice, drink plenty of fluids and please call your urologist first thing in the morning tomorrow.  If you have fever, vomiting or increased pain return to the emergency department.

## 2018-04-05 ENCOUNTER — Telehealth: Payer: Self-pay | Admitting: Urology

## 2018-04-05 ENCOUNTER — Ambulatory Visit: Payer: Managed Care, Other (non HMO) | Admitting: Allergy

## 2018-04-05 DIAGNOSIS — J309 Allergic rhinitis, unspecified: Secondary | ICD-10-CM

## 2018-04-05 LAB — URINALYSIS, COMPLETE
Bilirubin, UA: NEGATIVE
Glucose, UA: NEGATIVE
Ketones, UA: NEGATIVE
Nitrite, UA: NEGATIVE
Specific Gravity, UA: >1.03 — ABNORMAL HIGH (ref 1.005–1.030)
UUROB: 0.2 mg/dL (ref 0.2–1.0)
pH, UA: 5.5 (ref 5.0–7.5)

## 2018-04-05 LAB — URINALYSIS, COMPLETE (UACMP) WITH MICROSCOPIC

## 2018-04-05 LAB — MICROSCOPIC EXAMINATION
RBC, UA: >30 /hpf — ABNORMAL HIGH (ref 0–2)
WBC, UA: >30 /hpf — ABNORMAL HIGH (ref 0–5)

## 2018-04-05 MED ORDER — KETOROLAC TROMETHAMINE 15.75 MG/SPRAY NA SOLN: NASAL | 0 refills | Status: DC

## 2018-04-05 NOTE — Telephone Encounter (Signed)
Sprix has been sent to the pharmacy per [facility]. Patient notified.

## 2018-04-05 NOTE — Addendum Note (Signed)
Addended by: Kyra Manges on: 04/05/2018 01:32 PM   Modules accepted: Orders

## 2018-04-05 NOTE — Telephone Encounter (Signed)
Pt LMOM and states that her pharmacy no longer has Flomax "nasal spray" and she needs new rx. Please advise.

## 2018-04-06 ENCOUNTER — Other Ambulatory Visit: Payer: Managed Care, Other (non HMO) | Admitting: Urology

## 2018-04-10 ENCOUNTER — Telehealth: Payer: Self-pay | Admitting: Urology

## 2018-04-10 ENCOUNTER — Encounter: Payer: Self-pay | Admitting: Radiology

## 2018-04-10 NOTE — Telephone Encounter (Signed)
Please see message below and advise, last cx 03/29/2018. Should I have pt come in for repeat cx?

## 2018-04-10 NOTE — Telephone Encounter (Signed)
Pt called and wants a call back, she states that she just finished up her Bactrim today and she is still having burning w/ urination. Please advise.

## 2018-04-11 NOTE — Telephone Encounter (Signed)
OK to drop off urine sample to r/o UTI  Thanks Nickolas Madrid, MD 04/11/2018

## 2018-04-12 ENCOUNTER — Other Ambulatory Visit: Payer: Self-pay | Admitting: *Deleted

## 2018-04-12 NOTE — Telephone Encounter (Signed)
Can we please call this pt and have her added to the nurse schedule for tomorrow urine drop off please

## 2018-04-12 NOTE — Telephone Encounter (Signed)
Lm for pt to cb to see if she can come in on 04-13-18 for a UA drop off per the message below.   Sharyn Lull

## 2018-05-02 ENCOUNTER — Other Ambulatory Visit: Payer: Self-pay | Admitting: Urology

## 2018-05-02 ENCOUNTER — Telehealth (INDEPENDENT_AMBULATORY_CARE_PROVIDER_SITE_OTHER): Payer: Managed Care, Other (non HMO) | Admitting: Urology

## 2018-05-02 ENCOUNTER — Other Ambulatory Visit: Payer: Self-pay

## 2018-05-02 DIAGNOSIS — Z87442 Personal history of urinary calculi: Secondary | ICD-10-CM

## 2018-05-02 MED ORDER — INDAPAMIDE 2.5 MG PO TABS: 2.5000 mg | ORAL_TABLET | Freq: Every day | ORAL | 11 refills | Status: DC

## 2018-05-02 NOTE — Progress Notes (Signed)
Virtual Visit via Telephone Note  I connected with Kayla Sexton on 05/02/18 at 12:30 PM EDT by telephone and verified that I am speaking with the correct person using two identifiers.   I discussed the limitations, risks, security and privacy concerns of performing an evaluation and management service by telephone and the availability of in person appointments. We discussed the impact of the COVID-19 on the healthcare system, and the importance of social distancing and reducing patient and provider exposure. I also discussed with the patient that there may be a patient responsible charge related to this service. The patient expressed understanding and agreed to proceed.  Reason for visit: Hx of nephrolithiasis, review 24 hour urine results  History of Present Illness: Kayla Sexton is a 38 yo F with history of recurrent nephrolithiasis. She has undergone both left and right URS in the last 3 months. She does not tolerate stents well at all, with severe pain and nausea. Her stone analysis showed 90% Ca Oxalate, and 10% uric acid.  She reports she is doing well since her last URS, denies any flank pain or hematuria.   Her 24 hour urine results were grossly abnormal. Remarkable for very low urine volume <1L, elevated urine Ca of 291, acidic pH, and good oxalate. Sodium was also normal.  Assessment and Plan: We discussed general stone prevention strategies including adequate hydration with goal of producing 2.5 L of urine daily, increasing citric acid intake, increasing calcium intake during high oxalate meals, minimizing animal protein, and decreasing salt intake.   Follow Up: -Follow stone prevention strategies above -Start indapamide and Litholyte(K citrate) 74mEq daily -RTC 5 months for repeat 24 hour urine and KUB   I discussed the assessment and treatment plan with the patient. The patient was provided an opportunity to ask questions and all were answered. The patient agreed with the plan and  demonstrated an understanding of the instructions.   The patient was advised to call back or seek an in-person evaluation if the symptoms worsen or if the condition fails to improve as anticipated.  I provided 12 minutes of non-face-to-face time during this encounter.   Billey Co, MD

## 2018-05-03 ENCOUNTER — Emergency Department: Payer: Managed Care, Other (non HMO)

## 2018-05-03 ENCOUNTER — Emergency Department
Admission: EM | Admit: 2018-05-03 | Discharge: 2018-05-03 | Disposition: A | Discharge status: Home / Self Care | Payer: Managed Care, Other (non HMO) | Attending: Emergency Medicine | Admitting: Emergency Medicine

## 2018-05-03 ENCOUNTER — Other Ambulatory Visit: Payer: Self-pay

## 2018-05-03 DIAGNOSIS — S62632B Displaced fracture of distal phalanx of right middle finger, initial encounter for open fracture: Secondary | ICD-10-CM | POA: Diagnosis not present

## 2018-05-03 DIAGNOSIS — Y999 Unspecified external cause status: Secondary | ICD-10-CM | POA: Insufficient documentation

## 2018-05-03 DIAGNOSIS — S61312A Laceration without foreign body of right middle finger with damage to nail, initial encounter: Secondary | ICD-10-CM | POA: Diagnosis not present

## 2018-05-03 DIAGNOSIS — W208XXA Other cause of strike by thrown, projected or falling object, initial encounter: Secondary | ICD-10-CM | POA: Insufficient documentation

## 2018-05-03 DIAGNOSIS — Y929 Unspecified place or not applicable: Secondary | ICD-10-CM | POA: Diagnosis not present

## 2018-05-03 DIAGNOSIS — Z79899 Other long term (current) drug therapy: Secondary | ICD-10-CM | POA: Insufficient documentation

## 2018-05-03 DIAGNOSIS — Y9389 Activity, other specified: Secondary | ICD-10-CM | POA: Insufficient documentation

## 2018-05-03 DIAGNOSIS — S6991XA Unspecified injury of right wrist, hand and finger(s), initial encounter: Secondary | ICD-10-CM | POA: Diagnosis present

## 2018-05-03 MED ORDER — OXYCODONE-ACETAMINOPHEN 5-325 MG PO TABS
1.0000 | ORAL_TABLET | Freq: Once | ORAL | Status: AC
  Administered 2018-05-03: 1 via ORAL
  Filled 2018-05-03: qty 1

## 2018-05-03 MED ORDER — FLUCONAZOLE 150 MG PO TABS: 150.0000 mg | ORAL_TABLET | Freq: Once | ORAL | 0 refills | Status: AC

## 2018-05-03 MED ORDER — OXYCODONE-ACETAMINOPHEN 5-325 MG PO TABS: 1.0000 | ORAL_TABLET | Freq: Four times a day (QID) | ORAL | 0 refills | Status: DC | PRN

## 2018-05-03 MED ORDER — SULFAMETHOXAZOLE-TRIMETHOPRIM 800-160 MG PO TABS: 1.0000 | ORAL_TABLET | Freq: Two times a day (BID) | ORAL | 0 refills | Status: DC

## 2018-05-03 MED ORDER — CEPHALEXIN 500 MG PO CAPS: 1000.0000 mg | ORAL_CAPSULE | Freq: Two times a day (BID) | ORAL | 0 refills | Status: DC

## 2018-05-03 NOTE — ED Triage Notes (Signed)
Pt slammed 3rd right finger on screen door.
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
## 2018-05-04 ENCOUNTER — Ambulatory Visit: Payer: Managed Care, Other (non HMO) | Admitting: Urology

## 2018-06-01 ENCOUNTER — Other Ambulatory Visit: Payer: Self-pay

## 2018-06-01 ENCOUNTER — Telehealth: Payer: Self-pay | Admitting: Family Medicine

## 2018-06-01 NOTE — Telephone Encounter (Signed)
Pt called and is wanting to get setup with a cpap states she has new insurance this year and has meet her decub ile this year and should not have to pay anything for it. States she was going to have to pay $160 dollars for it last year and that Is why she did not get it last year, pt can be reached at [phone] states if you will please get her setup with a home health agneacy

## 2018-06-01 NOTE — Telephone Encounter (Signed)
Faxed over order for cpap and supplies. [place]

## 2018-06-01 NOTE — Telephone Encounter (Signed)
Set this up 

## 2018-07-06 ENCOUNTER — Telehealth: Payer: Self-pay

## 2018-07-06 NOTE — Telephone Encounter (Signed)
Called pt back about her Cpap mask. Need more info . No answer LVM. [place]

## 2018-07-07 ENCOUNTER — Telehealth: Payer: Self-pay

## 2018-07-07 NOTE — Telephone Encounter (Signed)
Sent over new order. [place]

## 2018-07-07 NOTE — Telephone Encounter (Signed)
Pt would like a nasal mask. [facility] says the will need a new order for nasal mask . Pt may not due for new mask and may have to pay out of pocket.

## 2018-07-18 ENCOUNTER — Telehealth: Payer: Self-pay | Admitting: Urology

## 2018-07-18 MED ORDER — INDAPAMIDE 2.5 MG PO TABS: 2.5000 mg | ORAL_TABLET | Freq: Every day | ORAL | 11 refills | Status: DC

## 2018-07-18 MED ORDER — CIPROFLOXACIN HCL 500 MG PO TABS: 500.0000 mg | ORAL_TABLET | Freq: Two times a day (BID) | ORAL | 0 refills | Status: DC

## 2018-07-18 NOTE — Telephone Encounter (Signed)
Pt called and states she feels like she has a UTI. She is having painful urination low back pain burning with urination also bloody urine. She would like an antibiotic called in if possible, she states that she can not get off work to come into office. Please advise.

## 2018-07-18 NOTE — Telephone Encounter (Signed)
Lets do 3 days of Cipro 500mg  BID  Thanks Nickolas Madrid, MD 07/18/2018

## 2018-07-18 NOTE — Addendum Note (Signed)
Addended by: Verlene Mayer A on: 07/18/2018 03:21 PM   Modules accepted: Orders

## 2018-07-18 NOTE — Telephone Encounter (Signed)
Informed patient; verbalized understanding.

## 2018-07-26 ENCOUNTER — Telehealth: Payer: Self-pay

## 2018-07-26 NOTE — Telephone Encounter (Signed)
Pt was seen at her job by the onsite [place] nurse due to having headache and nose bleed where she was informed she her blood pressure was 155/98.

## 2018-07-27 ENCOUNTER — Ambulatory Visit: Payer: Managed Care, Other (non HMO) | Admitting: Medical

## 2018-08-02 ENCOUNTER — Encounter: Payer: Self-pay | Admitting: Podiatry

## 2018-08-02 ENCOUNTER — Ambulatory Visit (INDEPENDENT_AMBULATORY_CARE_PROVIDER_SITE_OTHER): Payer: Managed Care, Other (non HMO) | Admitting: Podiatry

## 2018-08-02 ENCOUNTER — Ambulatory Visit (INDEPENDENT_AMBULATORY_CARE_PROVIDER_SITE_OTHER): Payer: Managed Care, Other (non HMO)

## 2018-08-02 ENCOUNTER — Other Ambulatory Visit: Payer: Self-pay | Admitting: Podiatry

## 2018-08-02 ENCOUNTER — Other Ambulatory Visit: Payer: Self-pay

## 2018-08-02 VITALS — BP 116/87 | HR 77 | Temp 97.8°F

## 2018-08-02 DIAGNOSIS — M7662 Achilles tendinitis, left leg: Secondary | ICD-10-CM

## 2018-08-02 DIAGNOSIS — M778 Other enthesopathies, not elsewhere classified: Secondary | ICD-10-CM

## 2018-08-02 DIAGNOSIS — M7732 Calcaneal spur, left foot: Secondary | ICD-10-CM

## 2018-08-02 MED ORDER — MELOXICAM 15 MG PO TABS: 15.0000 mg | ORAL_TABLET | Freq: Every day | ORAL | 1 refills | Status: DC

## 2018-08-02 MED ORDER — METHYLPREDNISOLONE 4 MG PO TBPK: ORAL_TABLET | ORAL | 0 refills | Status: DC

## 2018-08-06 NOTE — Progress Notes (Signed)
   HPI: 38 year old female presenting today with a chief complaint of worsening pain to the posterior left heel that began about 6 months ago. Standing and bearing weight increases the pain. She has not done anything for treatment. She denies any known trauma or injury. Patient is here for further evaluation and treatment.   Past Medical History:  Diagnosis Date  . CIN III (cervical intraepithelial neoplasia III)   . Dysuria   . GERD (gastroesophageal reflux disease)   . Hematuria   . HH (hiatus hernia)   . History of cervical dysplasia 2006   CIN1 and CIN2  . History of kidney stones   . Migraines   . PCOS (polycystic ovarian syndrome)    Dr. Helane Rima  . PONV (postoperative nausea and vomiting)   . Renal calculus, left   . Right ureteral calculus   . Sleep apnea       Physical Exam: General: The patient is alert and oriented x3 in no acute distress.  Dermatology: Skin is warm, dry and supple bilateral lower extremities. Negative for open lesions or macerations.  Vascular: Palpable pedal pulses bilaterally. No edema or erythema noted. Capillary refill within normal limits.  Neurological: Epicritic and protective threshold grossly intact bilaterally.   Musculoskeletal Exam: Pain on palpation noted to the posterior tubercle of the left calcaneus at the insertion of the Achilles tendon consistent with retrocalcaneal bursitis. Range of motion within normal limits. Muscle strength 5/5 in all muscle groups bilateral lower extremities.  Radiographic Exam:  Posterior calcaneal spur noted to the respective calcaneus on lateral view. No fracture or dislocation noted. Normal osseous mineralization noted.     Assessment: 1. Insertional Achilles tendinitis left 2. Retrocalcaneal bursitis 3. Posterior heel spur left    Plan of Care:  1. Patient was evaluated. Radiographs were reviewed today. 2. Injection of 0.5 mL Celestone Soluspan injected into the retrocalcaneal bursa. Care was taken  to avoid direct injection into the Achilles tendon. 3. Prescription for Medrol Dose Pak provided to patient. 4. Prescription for Meloxicam provided to patient. 5. Resume using CAM boot for two weeks.  6. Patient may need surgery at some point in the future.  7. Note for work provided.  8. Return to clinic in 4 weeks.   Works at [employer]. Dawn's (at the surgery center) daughter.    Edrick Kins, DPM [facility]  Dr. Edrick Kins, [address]                                        [place], [place] [address]                Office [phone]  Fax [phone]

## 2018-08-10 ENCOUNTER — Telehealth: Payer: Self-pay | Admitting: Podiatry

## 2018-08-10 ENCOUNTER — Encounter: Payer: Self-pay | Admitting: Podiatry

## 2018-08-10 NOTE — Telephone Encounter (Signed)
Pt called requesting an updated note for work stating she can return to work every other day until her surgery. Just wanting to verify if the doctor is okay with her returning to work now. Please fax new note to [phone], Attn: Michaela Corner

## 2018-08-11 ENCOUNTER — Encounter: Payer: Self-pay | Admitting: Podiatry

## 2018-08-16 ENCOUNTER — Other Ambulatory Visit: Payer: Self-pay

## 2018-08-16 ENCOUNTER — Encounter: Payer: Self-pay | Admitting: Podiatry

## 2018-08-16 ENCOUNTER — Ambulatory Visit (INDEPENDENT_AMBULATORY_CARE_PROVIDER_SITE_OTHER): Payer: Managed Care, Other (non HMO) | Admitting: Podiatry

## 2018-08-16 VITALS — Temp 98.0°F

## 2018-08-16 DIAGNOSIS — M7662 Achilles tendinitis, left leg: Secondary | ICD-10-CM | POA: Diagnosis not present

## 2018-08-16 NOTE — Progress Notes (Signed)
She presents today to discuss her Achilles tendon and the possibility of surgery left foot.  She states that is just tender down on the heel where my spur is.  Objective: Vital signs are stable she is alert and oriented x3.  Pulses are palpable.  She saw Dr. Amalia Hailey the last time she was here had an x-ray done and there was a spur found.  She is concerned about the spur and the chronic pain that she has.  Radiographs were reviewed today demonstrates the Achilles tendon appears to be normal there appears to be a fracture to spur which is older.  Assessment: Fractured spur Achilles appears normal cannot rule out a tear.  Plan: Requesting MRI at this time to make sure that the fracture of the spur did not result in a tear of the Achilles.

## 2018-08-21 ENCOUNTER — Other Ambulatory Visit: Payer: Self-pay

## 2018-08-21 DIAGNOSIS — M7662 Achilles tendinitis, left leg: Secondary | ICD-10-CM

## 2018-08-21 NOTE — Progress Notes (Addendum)
MRI approved from 08/21/2018 to 02/17/2019  Auth # [id number]  Patient was notified via voice mail and instructed to contact scheduling dept to set up own appt to her convenience.

## 2018-08-28 ENCOUNTER — Ambulatory Visit: Payer: Managed Care, Other (non HMO) | Admitting: Podiatry

## 2018-08-29 ENCOUNTER — Ambulatory Visit: Payer: Managed Care, Other (non HMO) | Admitting: Podiatry

## 2018-10-03 ENCOUNTER — Telehealth: Payer: Self-pay

## 2018-10-03 NOTE — Telephone Encounter (Signed)
Ashly called from [facility] and stated it appears that patient has been noncompliant with sleep study. Her insurance has requested she have another sleep study and another order put in to keep her cpap machine. It appears patient has only used her machine for 7 hours in the last month as it should be used at least 4 hours per night.   If there any questions Ashly can be reached at [phone].

## 2018-10-03 NOTE — Telephone Encounter (Signed)
Ok

## 2018-10-04 ENCOUNTER — Ambulatory Visit: Payer: Managed Care, Other (non HMO) | Admitting: Urology

## 2018-10-05 NOTE — Telephone Encounter (Signed)
lvm for pt to call back to the office . [place]

## 2018-10-09 NOTE — Telephone Encounter (Signed)
Pt has appt.today 10-09-18 [facility]

## 2018-10-10 ENCOUNTER — Telehealth: Payer: Self-pay

## 2018-10-10 NOTE — Telephone Encounter (Signed)
Spoke to Kayla Sexton to advise that she can drop off cpap machine due to insurance reasons. [place]

## 2018-10-11 ENCOUNTER — Ambulatory Visit: Payer: Managed Care, Other (non HMO) | Admitting: Urology

## 2018-10-11 ENCOUNTER — Encounter: Payer: Self-pay | Admitting: Urology

## 2019-05-26 ENCOUNTER — Ambulatory Visit: Payer: Self-pay | Attending: Internal Medicine

## 2019-07-04 ENCOUNTER — Other Ambulatory Visit (HOSPITAL_COMMUNITY): Payer: Self-pay | Admitting: Obstetrics and Gynecology

## 2019-07-26 ENCOUNTER — Other Ambulatory Visit: Payer: Self-pay

## 2019-07-26 DIAGNOSIS — M779 Enthesopathy, unspecified: Secondary | ICD-10-CM

## 2019-08-14 MED FILL — CYCLOBENZAPRINE HCL 10 MG T: 10 | 30 days supply | Qty: 30 | Fill #1

## 2019-08-14 MED FILL — buPROPion HCL ER (XL) 150 M: 150 | 30 days supply | Qty: 30 | Fill #1

## 2019-08-16 MED FILL — NITROFURANTOIN MONO-MCR 100: 100 | 7 days supply | Qty: 14 | Fill #0

## 2019-08-28 ENCOUNTER — Other Ambulatory Visit: Payer: Self-pay | Admitting: Podiatry

## 2019-08-28 ENCOUNTER — Other Ambulatory Visit: Payer: Self-pay

## 2019-08-28 ENCOUNTER — Ambulatory Visit (INDEPENDENT_AMBULATORY_CARE_PROVIDER_SITE_OTHER): Payer: No Typology Code available for payment source

## 2019-08-28 ENCOUNTER — Ambulatory Visit (INDEPENDENT_AMBULATORY_CARE_PROVIDER_SITE_OTHER): Payer: No Typology Code available for payment source | Admitting: Podiatry

## 2019-08-28 DIAGNOSIS — M779 Enthesopathy, unspecified: Secondary | ICD-10-CM | POA: Diagnosis not present

## 2019-08-28 DIAGNOSIS — M7732 Calcaneal spur, left foot: Secondary | ICD-10-CM

## 2019-08-28 DIAGNOSIS — M7662 Achilles tendinitis, left leg: Secondary | ICD-10-CM | POA: Diagnosis not present

## 2019-08-31 NOTE — Progress Notes (Signed)
Subjective: 39 year old female presents the office today for concerns of pain in the back of her heel and along the bone spur.  She previously seen Dr. Milinda Pointer as well as Dr. Amalia Hailey for this and she states the pain is continued get worse and she has pain on a daily basis.  She wants to discuss surgical options at this time  Denies any systemic complaints such as fevers, chills, nausea, vomiting. No acute changes since last appointment, and no other complaints at this time.   Objective: AAO x3, NAD DP/PT pulses palpable 2/4 bilaterally, CRT less than 3 seconds There is tenderness palpation the posterior aspect the left heel on the area of a bone spur which is palpable.  There is minimal edema there is no erythema or warmth.  Achilles tendon appears to be intact.  Equinus is evident.  There is no pain backwards the calcaneus or other areas of discomfort identified today. No pain with calf compression, swelling, warmth, erythema  Assessment: Posterior calcaneal spur, equinus  Plan: -All treatment options discussed with the patient including all alternatives, risks, complications.  -X-rays obtained reviewed the calcaneal spur is evident the posterior calcaneus. -We discussed with conservative as well as surgical treatment options.  She is interested in the most conservative options but any significant improvement she was discussed surgical intervention. -Discussed posterior calcaneal spur ostectomy with detachment reattachment Achilles tendon with Achilles tenolysis also gastrocnemius recession.  She also proceed with this. -The incision placement as well as the postoperative course was discussed with the patient. I discussed risks of the surgery which include, but not limited to, infection, bleeding, pain, swelling, need for further surgery, delayed or nonhealing, painful or ugly scar, numbness or sensation changes, over/under correction, recurrence, transfer lesions, further deformity, hardware failure,  DVT/PE, loss of toe/foot. Patient understands these risks and wishes to proceed with surgery. The surgical consent was reviewed with the patient all 3 pages were signed. No promises or guarantees were given to the outcome of the procedure. All questions were answered to the best of my ability. Before the surgery the patient was encouraged to call the office if there is any further questions. The surgery will be performed at the [facility] on an outpatient basis.  No follow-ups on file.  Trula Slade DPM  -Patient encouraged to call the office with any questions, concerns, change in symptoms.

## 2019-09-04 ENCOUNTER — Telehealth: Payer: Self-pay | Admitting: Physician Assistant

## 2019-09-04 ENCOUNTER — Telehealth: Payer: No Typology Code available for payment source | Admitting: Physician Assistant

## 2019-09-04 ENCOUNTER — Encounter: Payer: Self-pay | Admitting: Family Medicine

## 2019-09-04 DIAGNOSIS — L03311 Cellulitis of abdominal wall: Secondary | ICD-10-CM

## 2019-09-04 MED ORDER — CEPHALEXIN 500 MG PO CAPS: 500.0000 mg | ORAL_CAPSULE | Freq: Four times a day (QID) | ORAL | 0 refills | Status: AC

## 2019-09-04 MED ORDER — CEPHALEXIN 500 MG PO CAPS: ORAL_CAPSULE | ORAL | 0 refills | Status: DC

## 2019-09-04 MED FILL — CEPHALEXIN 500 MG CAPSULE: 500 | 7 days supply | Qty: 28 | Fill #0

## 2019-09-04 NOTE — Progress Notes (Signed)
Duplicate visit  

## 2019-09-04 NOTE — Addendum Note (Signed)
Addended by: Abigail Butts on: 09/04/2019 03:30 PM   Modules accepted: Orders

## 2019-09-04 NOTE — Progress Notes (Addendum)
E Visit for Cellulitis  I haven't been able to access a photo, but your description does sound like cellulitis.  My concern is that you might have an abscess.  I'm going to write you an antibiotic to see if we can get it cleared up.  I would recommend warm compresses and a 24 hour follow-up with an urgent care if things seem to be worsening or you develop a fever.  Your diabetes puts you a higher risk for significant infection.  We are sorry that you are not feeling well. Here is how we plan to help!  Based on what you shared with me it looks like you have cellulitis.  Cellulitis looks like areas of skin redness, swelling, and warmth; it develops as a result of bacteria entering under the skin. Little red spots and/or bleeding can be seen in skin, and tiny surface sacs containing fluid can occur. Fever can be present. Cellulitis is almost always on one side of a body, and the lower limbs are the most common site of involvement.   I have prescribed:  Keflex 500mg  take one by mouth four times a day for 7 days  HOME CARE:  . Take your medications as ordered and take all of them, even if the skin irritation appears to be healing.   GET HELP RIGHT AWAY IF:  . Symptoms that don't begin to go away within 48 hours. . Severe redness persists or worsens . If the area turns color, spreads or swells. . If it blisters and opens, develops yellow-brown crust or bleeds. . You develop a fever or chills. . If the pain increases or becomes unbearable.  . Are unable to keep fluids and food down.  MAKE SURE YOU    Understand these instructions.  Will watch your condition.  Will get help right away if you are not doing well or get worse.  Thank you for choosing an e-visit. Your e-visit answers were reviewed by a board certified advanced clinical practitioner to complete your personal care plan. Depending upon the condition, your plan could have included both over the counter or prescription  medications. Please review your pharmacy choice. Make sure the pharmacy is open so you can pick up prescription now. If there is a problem, you may contact your provider through [employer] and have the prescription routed to another pharmacy. Your safety is important to [place]. If you have drug allergies check your prescription carefully.  For the next 24 hours you can use MyChart to ask questions about today's visit, request a non-urgent call back, or ask for a work or school excuse. You will get an email in the next two days asking about your experience. I hope that your e-visit has been valuable and will speed your recovery.  Greater than 5 minutes, yet less than 10 minutes of time have been spent researching, coordinating, and implementing care for this patient today

## 2019-09-28 ENCOUNTER — Other Ambulatory Visit: Payer: Self-pay

## 2019-09-28 ENCOUNTER — Telehealth (INDEPENDENT_AMBULATORY_CARE_PROVIDER_SITE_OTHER): Payer: No Typology Code available for payment source | Admitting: Medical

## 2019-09-28 DIAGNOSIS — M25559 Pain in unspecified hip: Secondary | ICD-10-CM | POA: Insufficient documentation

## 2019-09-28 DIAGNOSIS — M549 Dorsalgia, unspecified: Secondary | ICD-10-CM | POA: Diagnosis not present

## 2019-09-28 DIAGNOSIS — M545 Low back pain, unspecified: Secondary | ICD-10-CM | POA: Insufficient documentation

## 2019-09-28 DIAGNOSIS — Z87442 Personal history of urinary calculi: Secondary | ICD-10-CM | POA: Insufficient documentation

## 2019-09-28 DIAGNOSIS — N200 Calculus of kidney: Secondary | ICD-10-CM | POA: Insufficient documentation

## 2019-09-28 NOTE — Progress Notes (Signed)
Subjective:     Patient ID: Kayla Sexton, female   DOB: [birth year], 39 y.o.   MRN: [id number]  This visit type was conducted due to national recommendations for restrictions regarding the COVID-19 Pandemic (e.g. social distancing) in an effort to limit this patient's exposure and mitigate transmission in our community.  Due to their co-morbid illnesses, this patient is at least at moderate risk for complications without adequate follow up.  This format is felt to be most appropriate for this patient at this time.    Documentation for virtual audio and video telecommunications through [facility] encounter:  The patient was located at home. The provider was located in the office. The patient did consent to this visit and is aware of possible charges through their insurance for this visit.  The other persons participating in this telemedicine service were none. Time spent on call was 20 minutes and in review of previous records 20 minutes total.  This virtual service is not related to other E/M service within previous 7 days.   HPI Chief Complaint  Patient presents with  . Back Pain   She notes for a month or more, having bad pains in middle of back and halfway up back, on left side.  Saw gynecology, and they wondered about a shift in her pelvis.  During the day has unbearable pains sitting at desk.  No fall, no specific injury.  At one point thought it was kidney stone or UTI.  Had UTI test in recent weeks that was normal.  Feels better slouching compared to standing.  Was prescribe muscle relaxer by gynecology. This helps some.   Can't take the muscle relaxer in the day due to sedation.   Has tried OTC aleve, but this isn't helping either.  No pain down legs, but has had some cramps in hips and upper thighs.   No numbness or tingling in legs.   No problems urinating but has some constipation.   Seems to have worse constipation with flexeril.    No fever, sometimes has lower abdominal  discomfort.    No blood in urine or stool.  Does some stretching in the mornings   No other specific exercise.  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  No other aggravating or relieving factors. No other complaint.  Past Medical History:  Diagnosis Date  . CIN III (cervical intraepithelial neoplasia III)   . Dysuria   . GERD (gastroesophageal reflux disease)   . Hematuria   . HH (hiatus hernia)   . History of cervical dysplasia 2006   CIN1 and CIN2  . History of kidney stones   . Migraines   . PCOS (polycystic ovarian syndrome)    Dr. Helane Rima  . PONV (postoperative nausea and vomiting)   . Renal calculus, left   . Right ureteral calculus   . Sleep apnea    Past Surgical History:  Procedure Laterality Date  . CERVICAL BIOPSY  W/ LOOP ELECTRODE EXCISION  12-04-2004   dr Helane Rima  [facility]  . COLONOSCOPY  03/2008   Dr. Michail Sermon  . COMBINED HYSTEROSCOPY DIAGNOSTIC / D&C  02-28-2012   dr Helane Rima   @ [facility]  . CYSTOSCOPY W/ RETROGRADES Right 03/31/2018   Procedure: CYSTOSCOPY WITH RETROGRADE PYELOGRAM;  Surgeon: Billey Co, MD;  Location: [facility];  Service: Urology;  Laterality: Right;  . CYSTOSCOPY WITH RETROGRADE PYELOGRAM, URETEROSCOPY AND STENT PLACEMENT Bilateral 07/21/2017   Procedure: CYSTOSCOPY WITH BILATERAL  RETROGRADE BILATERAL URETEROSCOPY AND STENTS  PLACEMENT;  Surgeon: Irine Seal, MD;  Location: [facility];  Service: Urology;  Laterality: Bilateral;  . CYSTOSCOPY/URETEROSCOPY/HOLMIUM LASER/STENT PLACEMENT Left 03/17/2018   Procedure: CYSTOSCOPY/URETEROSCOPY/HOLMIUM LASER/STENT PLACEMENT;  Surgeon: Billey Co, MD;  Location: [facility];  Service: Urology;  Laterality: Left;  . CYSTOSCOPY/URETEROSCOPY/HOLMIUM LASER/STENT PLACEMENT Right 03/31/2018   Procedure: CYSTOSCOPY/URETEROSCOPY/HOLMIUM LASER/STENT PLACEMENT;  Surgeon: Billey Co, MD;  Location: [facility];  Service: Urology;  Laterality: Right;  . endoscopy  2010   Dr. Amedeo Plenty  . FOOT SURGERY  Left 2015   5th bunionectomy w/ 2nd digit w/ fusion , retained hardware w/ 5th toe  . HOLMIUM LASER APPLICATION Bilateral 02/26/3341   Procedure: HOLMIUM LASER APPLICATION;  Surgeon: Irine Seal, MD;  Location: [facility];  Service: Urology;  Laterality: Bilateral;     Review of Systems As in subjective    Objective:   Physical Exam Due to coronavirus pandemic stay at home measures, patient visit was virtual and they were not examined in person.   Gen: wd, wn, nad relatively full back ROM Otherwise not examined, limited by virtual consult     Assessment:     Encounter Diagnoses  Name Primary?  . Acute bilateral low back pain without sciatica Yes  . Hip pain   . Mid back pain   . History of renal stone        Plan:     We discussed the limitations of virtual consult.  We could not get a full exam since this was virtual.  Given her symptoms we will go ahead and set up for baseline x-rays.  She can go at her convenience for these.  She does have an appointment coming up within 2 weeks with her primary care provider here Dr. Redmond School and will do labs and urine at that visit.  he can recheck on her symptoms and examine her better at that time  Of note she did have surgery last year for kidney stones that were quite large.  We will recheck kidney marker when she comes in for that upcoming visit  Advised continued stretching, she still has some muscle relaxer and anti-inflammatory she can use as needed  [name] was seen today for back pain.  Diagnoses and all orders for this visit:  Acute bilateral low back pain without sciatica -     DG HIPS BILAT WITH PELVIS 3-4 VIEWS; Future -     DG THORACOLUMABAR SPINE; Future  Hip pain -     DG HIPS BILAT WITH PELVIS 3-4 VIEWS; Future -     DG THORACOLUMABAR SPINE; Future  Mid back pain -     DG THORACOLUMABAR SPINE; Future  History of renal stone  f/u soon with Dr. Redmond School here for physical, f/u pending xrays

## 2019-10-17 ENCOUNTER — Other Ambulatory Visit: Payer: Self-pay

## 2019-10-17 ENCOUNTER — Ambulatory Visit (INDEPENDENT_AMBULATORY_CARE_PROVIDER_SITE_OTHER): Payer: No Typology Code available for payment source | Admitting: Family Medicine

## 2019-10-17 VITALS — BP 126/82 | HR 94 | Temp 98.8°F | Wt 195.4 lb

## 2019-10-17 DIAGNOSIS — M545 Low back pain, unspecified: Secondary | ICD-10-CM

## 2019-10-17 DIAGNOSIS — E282 Polycystic ovarian syndrome: Secondary | ICD-10-CM

## 2019-10-17 NOTE — Patient Instructions (Signed)
Acute Back Pain, Adult Acute back pain is sudden and usually short-lived. It is often caused by an injury to the muscles and tissues in the back. The injury may result from:  A muscle or ligament getting overstretched or torn (strained). Ligaments are tissues that connect bones to each other. Lifting something improperly can cause a back strain.  Wear and tear (degeneration) of the spinal disks. Spinal disks are circular tissue that provides cushioning between the bones of the spine (vertebrae).  Twisting motions, such as while playing sports or doing yard work.  A hit to the back.  Arthritis. You may have a physical exam, lab tests, and imaging tests to find the cause of your pain. Acute back pain usually goes away with rest and home care. Follow these instructions at home: Managing pain, stiffness, and swelling  Take over-the-counter and prescription medicines only as told by your health care provider.  Your health care provider may recommend applying ice during the first 24-48 hours after your pain starts. To do this: ? Put ice in a plastic bag. ? Place a towel between your skin and the bag. ? Leave the ice on for 20 minutes, 2-3 times a day.  If directed, apply heat to the affected area as often as told by your health care provider. Use the heat source that your health care provider recommends, such as a moist heat pack or a heating pad. ? Place a towel between your skin and the heat source. ? Leave the heat on for 20-30 minutes. ? Remove the heat if your skin turns bright red. This is especially important if you are unable to feel pain, heat, or cold. You have a greater risk of getting burned. Activity   Do not stay in bed. Staying in bed for more than 1-2 days can delay your recovery.  Sit up and stand up straight. Avoid leaning forward when you sit, or hunching over when you stand. ? If you work at a desk, sit close to it so you do not need to lean over. Keep your chin tucked  in. Keep your neck drawn back, and keep your elbows bent at a right angle. Your arms should look like the letter "L." ? Sit high and close to the steering wheel when you drive. Add lower back (lumbar) support to your car seat, if needed.  Take short walks on even surfaces as soon as you are able. Try to increase the length of time you walk each day.  Do not sit, drive, or stand in one place for more than 30 minutes at a time. Sitting or standing for long periods of time can put stress on your back.  Do not drive or use heavy machinery while taking prescription pain medicine.  Use proper lifting techniques. When you bend and lift, use positions that put less stress on your back: ? Bend your knees. ? Keep the load close to your body. ? Avoid twisting.  Exercise regularly as told by your health care provider. Exercising helps your back heal faster and helps prevent back injuries by keeping muscles strong and flexible.  Work with a physical therapist to make a safe exercise program, as recommended by your health care provider. Do any exercises as told by your physical therapist. Lifestyle  Maintain a healthy weight. Extra weight puts stress on your back and makes it difficult to have good posture.  Avoid activities or situations that make you feel anxious or stressed. Stress and anxiety increase muscle   tension and can make back pain worse. Learn ways to manage anxiety and stress, such as through exercise. General instructions  Sleep on a firm mattress in a comfortable position. Try lying on your side with your knees slightly bent. If you lie on your back, put a pillow under your knees.  Follow your treatment plan as told by your health care provider. This may include: ? Cognitive or behavioral therapy. ? Acupuncture or massage therapy. ? Meditation or yoga. Contact a health care provider if:  You have pain that is not relieved with rest or medicine.  You have increasing pain going down  into your legs or buttocks.  Your pain does not improve after 2 weeks.  You have pain at night.  You lose weight without trying.  You have a fever or chills. Get help right away if:  You develop new bowel or bladder control problems.  You have unusual weakness or numbness in your arms or legs.  You develop nausea or vomiting.  You develop abdominal pain.  You feel faint. Summary  Acute back pain is sudden and usually short-lived.  Use proper lifting techniques. When you bend and lift, use positions that put less stress on your back.  Take over-the-counter and prescription medicines and apply heat or ice as directed by your health care provider. This information is not intended to replace advice given to you by your health care provider. Make sure you discuss any questions you have with your health care provider. Document Revised: 05/02/2018 Document Reviewed: 08/25/2016 Elsevier Patient Education  [place]. Take 4 ibuprofen 3 times per day as well as doing the heat and stretching and proper posturing

## 2019-10-17 NOTE — Progress Notes (Signed)
   Subjective:    Patient ID: Kayla Sexton, female    DOB: [birth year], 39 y.o.   MRN: [id number]  HPI She is here for follow-up on low back pain.  She was seen virtually on September 3 and x-rays were ordered but she has not gotten them yet.  She did use some Flexeril to help with this and has been using ibuprofen 2 pills 4 times per day. She then mentioned PCOS, being on Wellbutrin and wanting follow-up insulin levels.  She then mentioned ADD and possibly needing be placed back on that medication.   Review of Systems     Objective:   Physical Exam Alert and in no distress.  Tender to palpation over the mid spine area and to a lesser extent over SI joints.  Normal lumbar motion and curve.  DTRs are 2+.  Negative straight leg raising.       Assessment & Plan:  Acute bilateral low back pain without sciatica  PCOS (polycystic ovarian syndrome) I explained that she brought up multiple issues concerning her PCOS and needing further insulin levels etc.  Recommend she follow-up with her gynecologist concerning this. I then briefly discussed the ADD and explained that since she is on the Wellbutrin, we would need to readdress this at a later date to discuss it in more detail. Recommend heat, stretching and 4 ibuprofen 3 times a day to help with the back and if continued difficulty then further evaluation and getting x-rays.

## 2019-11-05 ENCOUNTER — Telehealth (INDEPENDENT_AMBULATORY_CARE_PROVIDER_SITE_OTHER): Payer: No Typology Code available for payment source | Admitting: Medical

## 2019-11-05 ENCOUNTER — Encounter: Payer: Self-pay | Admitting: Medical

## 2019-11-05 ENCOUNTER — Ambulatory Visit (HOSPITAL_COMMUNITY)
Admission: RE | Admit: 2019-11-05 | Discharge: 2019-11-05 | Disposition: A | Discharge status: Home / Self Care | Payer: No Typology Code available for payment source | Source: Ambulatory Visit | Attending: Medical | Admitting: Medical

## 2019-11-05 ENCOUNTER — Other Ambulatory Visit: Payer: Self-pay

## 2019-11-05 ENCOUNTER — Other Ambulatory Visit (HOSPITAL_COMMUNITY): Payer: Self-pay | Admitting: Internal Medicine

## 2019-11-05 VITALS — Ht 62.0 in | Wt 193.0 lb

## 2019-11-05 DIAGNOSIS — M549 Dorsalgia, unspecified: Secondary | ICD-10-CM

## 2019-11-05 DIAGNOSIS — M25559 Pain in unspecified hip: Secondary | ICD-10-CM

## 2019-11-05 DIAGNOSIS — R059 Cough, unspecified: Secondary | ICD-10-CM

## 2019-11-05 DIAGNOSIS — M545 Low back pain, unspecified: Secondary | ICD-10-CM

## 2019-11-05 MED ORDER — PROMETHAZINE-DM 6.25-15 MG/5ML PO SYRP: 5.0000 mL | ORAL_SOLUTION | Freq: Four times a day (QID) | ORAL | 0 refills | Status: DC | PRN

## 2019-11-05 MED ORDER — CLARITHROMYCIN 500 MG PO TABS: 500.0000 mg | ORAL_TABLET | Freq: Two times a day (BID) | ORAL | 0 refills | Status: DC

## 2019-11-05 MED FILL — FLUARIX QUADRIVALENT 0.5 ML: 0.5 | 1 days supply | Qty: 1 | Fill #0

## 2019-11-05 MED FILL — PROMETHAZINE W/DM SYRUP: 6.25-15 | 6 days supply | Qty: 120 | Fill #0

## 2019-11-05 MED FILL — CLARITHROMYCIN 500 MG TAB: 500 | 10 days supply | Qty: 20 | Fill #0

## 2019-11-05 NOTE — Progress Notes (Signed)
MyChart Video Visit    Virtual Visit via Video Note   This visit type was conducted due to national recommendations for restrictions regarding the COVID-19 Pandemic (e.g. social distancing) in an effort to limit this patient's exposure and mitigate transmission in our community. This patient is at least at moderate risk for complications without adequate follow up. This format is felt to be most appropriate for this patient at this time. Physical exam was limited by quality of the video and audio technology used for the visit.   Patient location: home Provider location: office  I discussed the limitations of evaluation and management by telemedicine and the availability of in person appointments. The patient expressed understanding and agreed to proceed.  Patient: Kayla Sexton   DOB: October 16, 1980   39 y.o. Female  MRN: [id number] Visit Date: 11/05/2019  Today's healthcare provider: Dorothea Ogle, PA-C   Chief Complaint  Patient presents with  . Cough  . Nasal Congestion  . Ear Pain  . Sore Throat   Subjective    HPI  Upper respiratory symptoms She complains of nasal congestion,cough, sore throat and left ear pain with fatigue. Onset of symptoms was a few days ago and worsening.She is drinking moderate amounts of fluids.  Past history is significant for bronchitis. Patient is not a smoker. Patient has tried Robitussin DM and Mucinex with no relief. She tested Negative for COVID on 11/03/19.   Has tickling cough, lots of phelm in throat, yellowish color to mucous, chest is heavy.  Called on call/Dr. Redmond Sexton over weekend.   Was advise robitussin and Tylenol.   Is on day 5 at this time.  Has some slight wheeze.  Has o2 monitor and sats around 96%, as low as 95%.    Has had some nausea, 1 episode of vomiting yesterday.   No diarrhea.  No body aches or chills.  No fever.  Smell is off due to nasal congestion, but no loss of taste.    She had covid vaccine, both doses.  No sick  contacts.  Works in Teacher, music.    No hx/o asthma.    Medications: Outpatient Medications Prior to Visit  Medication Sig  . buPROPion (WELLBUTRIN) 100 MG tablet   . BYETTA 5 MCG PEN 5 MCG/0.02ML SOPN injection SMARTSIG:5 MCG SUB-Q Twice Daily  . cyclobenzaprine (FLEXERIL) 10 MG tablet Take 10 mg by mouth daily.  Marland Kitchen ibuprofen (ADVIL,MOTRIN) 200 MG tablet Take 400 mg by mouth every 6 (six) hours as needed for moderate pain.   Marland Kitchen UNIFINE PENTIPS 32G X 4 MM MISC USE WITH BYETTA TWICE DAILY.  Marland Kitchen Aspirin-Acetaminophen-Caffeine (EXCEDRIN MIGRAINE PO) Take 2 tablets by mouth daily as needed (migraines).  (Patient not taking: Reported on 11/05/2019)   No facility-administered medications prior to visit.    Review of Systems As in subjective    Objective    Ht 5\' 2"  (1.575 m)   Wt 193 lb (87.5 kg)   BMI 35.30 kg/m    Physical Exam  Not examined in person as this was a virtual consult     Assessment & Plan   Encounter Diagnosis  Name Primary?  . Cough Yes        We discussed symptoms and concerns.  She will go for chest x-ray.  I will tentatively start her on medicine as below unless chest x-ray shows Covid then we would go with a different treatment instead of Biaxin antibiotic.  We would consider infusion therapy, supportive therapy such  as albuterol if needed.  Advise rest, hydration, follow-up pending x-ray  Kayla Sexton was seen today for cough, nasal congestion, ear pain and sore throat.  Diagnoses and all orders for this visit:  Cough -     DG Chest 2 View; Future  Other orders -     promethazine-dextromethorphan (PROMETHAZINE-DM) 6.25-15 MG/5ML syrup; Take 5 mLs by mouth 4 (four) times daily as needed for cough. -     clarithromycin (BIAXIN) 500 MG tablet; Take 1 tablet (500 mg total) by mouth 2 (two) times daily.     I discussed the assessment and treatment plan with the patient. The patient was provided an opportunity to ask questions and all were answered. The  patient agreed with the plan and demonstrated an understanding of the instructions.   The patient was advised to call back or seek an in-person evaluation if the symptoms worsen or if the condition fails to improve as anticipated.  I provided 15 minutes of non-face-to-face time during this encounter.    Kayla Ogle, PA-C [place] [phone] (phone) [phone] (fax)  [place]

## 2019-11-06 ENCOUNTER — Telehealth: Payer: Self-pay | Admitting: Family Medicine

## 2019-11-06 NOTE — Telephone Encounter (Signed)
Shane please review.

## 2019-11-06 NOTE — Telephone Encounter (Signed)
Pt called wanted to know about her X-ray results.  Please call

## 2019-11-06 NOTE — Telephone Encounter (Signed)
Please call [facility].  They haven't resulted, like they are hung up in the system

## 2019-11-07 NOTE — Telephone Encounter (Signed)
Imaging results are in. Please review.

## 2019-11-07 NOTE — Telephone Encounter (Signed)
I called and left message for her to call back about x-rays

## 2019-11-09 ENCOUNTER — Other Ambulatory Visit: Payer: Self-pay | Admitting: Internal Medicine

## 2019-11-10 ENCOUNTER — Other Ambulatory Visit: Payer: Self-pay | Admitting: Family Medicine

## 2019-11-10 MED ORDER — FLUCONAZOLE 150 MG PO TABS: 150.0000 mg | ORAL_TABLET | Freq: Once | ORAL | 0 refills | Status: DC

## 2019-11-12 MED FILL — FLUCONAZOLE 150 MG TABS: 150 | 1 days supply | Qty: 1 | Fill #0

## 2019-11-14 ENCOUNTER — Other Ambulatory Visit: Payer: Self-pay | Admitting: Medical

## 2019-11-14 ENCOUNTER — Ambulatory Visit (INDEPENDENT_AMBULATORY_CARE_PROVIDER_SITE_OTHER): Payer: No Typology Code available for payment source | Admitting: Medical

## 2019-11-14 ENCOUNTER — Encounter: Payer: Self-pay | Admitting: Medical

## 2019-11-14 DIAGNOSIS — S299XXA Unspecified injury of thorax, initial encounter: Secondary | ICD-10-CM | POA: Diagnosis not present

## 2019-11-14 DIAGNOSIS — S0993XA Unspecified injury of face, initial encounter: Secondary | ICD-10-CM | POA: Diagnosis not present

## 2019-11-14 DIAGNOSIS — H538 Other visual disturbances: Secondary | ICD-10-CM | POA: Diagnosis not present

## 2019-11-14 DIAGNOSIS — S0083XA Contusion of other part of head, initial encounter: Secondary | ICD-10-CM | POA: Insufficient documentation

## 2019-11-14 DIAGNOSIS — Z91410 Personal history of adult physical and sexual abuse: Secondary | ICD-10-CM | POA: Insufficient documentation

## 2019-11-14 DIAGNOSIS — T7411XA Adult physical abuse, confirmed, initial encounter: Secondary | ICD-10-CM | POA: Insufficient documentation

## 2019-11-14 MED ORDER — FLUCONAZOLE 150 MG PO TABS: 150.0000 mg | ORAL_TABLET | ORAL | 0 refills | Status: DC

## 2019-11-14 MED FILL — FLUCONAZOLE 150 MG TABS: 150 | 14 days supply | Qty: 2 | Fill #0

## 2019-11-14 NOTE — Telephone Encounter (Signed)
handled

## 2019-11-14 NOTE — Patient Instructions (Signed)
Recommendations  Rest  We will schedule CT scan of orbit tomorrow  Use Tylenol over the counter every 4-6 hours for the next few days for pain  Use ice/cool therapy for face/ head 20 minutes on/20 minutes off  Avoid significant concentration or difficult tasks the next few days to rest  If any signs of concussion as below, get re-evaluated  Stay safe, have a safe place to go     Concussion, Adult  A concussion is a brain injury from a hard, direct hit (trauma) to your head or body. This direct hit causes the brain to quickly shake back and forth inside the skull. A concussion may also be called a mild traumatic brain injury (TBI). Healing from this injury can take time. What are the causes? This condition is caused by:  A direct hit to your head, such as: ? Running into a player during a game. ? Being hit in a fight. ? Hitting your head on a hard surface.  A quick and sudden movement (jolt) of the head or neck, such as in a car crash. What are the signs or symptoms? The signs of a concussion can be hard to notice. They may be missed by you, family members, and doctors. You may look fine on the outside but may not act or feel normal. Physical symptoms  Headaches.  Being tired (fatigued).  Being dizzy.  Problems with body balance.  Problems seeing or hearing.  Being sensitive to light or noise.  Feeling sick to your stomach (nausea) or throwing up (vomiting).  Not sleeping or eating as you used to.  Loss of feeling (numbness) or tingling in the body.  Seizure. Mental and emotional symptoms  Problems remembering things.  Trouble focusing your mind (concentrating), organizing, or making decisions.  Being slow to think, act, react, speak, or read.  Feeling grouchy (irritable).  Having mood changes.  Feeling worried or nervous (anxious).  Feeling sad (depressed). How is this treated? This condition may be treated by:  Stopping sports or activity if  you are injured. If you hit your head or have signs of concussion: ? Do not return to sports or activities the same day. ? Get checked by a doctor before you return to your activities.  Resting your body and your mind.  Being watched carefully, often at home.  Medicines to help with symptoms such as: ? Feeling sick to your stomach. ? Headaches. ? Problems with sleep.  Avoid taking strong pain medicines (opioids) for a concussion.  Avoiding alcohol and drugs.  Being asked to go to a concussion clinic or a place to help you recover (rehabilitation center). Recovery from a concussion can take time. Return to activities only:  When you are fully healed.  When your doctor says it is safe. Follow these instructions at home: Activity  Limit activities that need a lot of thought or focus, such as: ? Homework or work for your job. ? Watching TV. ? Using the computer or phone. ? Playing memory games and puzzles.  Rest. Rest helps your brain heal. Make sure you: ? Get plenty of sleep. Most adults should get 7-9 hours of sleep each night. ? Rest during the day. Take naps or breaks when you feel tired.  Avoid activity like exercise until your doctor says its safe. Stop any activity that makes symptoms worse.  Do not do activities that could cause a second concussion, such as riding a bike or playing sports.  Ask your doctor when  you can return to your normal activities, such as school, work, sports, and driving. Your ability to react may be slower. Do not do these activities if you are dizzy. General instructions   Take over-the-counter and prescription medicines only as told by your doctor.  Do not drink alcohol until your doctor says you can.  Watch your symptoms and tell other people to do the same. Other problems can occur after a concussion. Older adults have a higher risk of serious problems.  Tell your work Freight forwarder, teachers, Government social research officer, school counselor, coach, or  Product/process development scientist about your injury and symptoms. Tell them about what you can or cannot do.  Keep all follow-up visits as told by your doctor. This is important. How is this prevented?  It is very important that you do not get another brain injury. In rare cases, another injury can cause brain damage that will not go away, brain swelling, or death. The risk of this is greatest in the first 7-10 days after a head injury. To avoid injuries: ? Stop activities that could lead to a second concussion, such as contact sports, until your doctor says it is okay. ? When you return to sports or activities:  Do not crash into other players. This is how most concussions happen.  Follow the rules.  Respect other players. ? Get regular exercise. Do strength and balance training. ? Wear a helmet that fits you well during sports, biking, or other activities.  Helmets can help protect you from serious skull and brain injuries, but they do not protect you from a concussion. Even when wearing a helmet, you should avoid being hit in the head. Contact a doctor if:  Your symptoms get worse or they do not get better.  You have new symptoms.  You have another injury. Get help right away if:  You have bad headaches or your headaches get worse.  You feel weak or numb in any part of your body.  You are mixed up (confused).  Your balance gets worse.  You keep throwing up.  You feel more sleepy than normal.  Your speech is not clear (is slurred).  You cannot recognize people or places.  You have a seizure.  Others have trouble waking you up.  You have behavior changes.  You have changes in how you see (vision).  You pass out (lose consciousness). Summary  A concussion is a brain injury from a hard, direct hit (trauma) to your head or body.  This condition is treated with rest and careful watching of symptoms.  If you keep having symptoms, call your doctor. This information is not  intended to replace advice given to you by your health care provider. Make sure you discuss any questions you have with your health care provider. Document Revised: 09/01/2017 Document Reviewed: 09/01/2017 Elsevier Patient Education  [place].
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
## 2019-11-15 LAB — BASIC METABOLIC PANEL
BUN/Creatinine Ratio: 14 (ref 9–23)
BUN: 10 mg/dL (ref 6–20)
CO2: 24 mmol/L (ref 20–29)
Calcium: 9.1 mg/dL (ref 8.7–10.2)
Chloride: 107 mmol/L — ABNORMAL HIGH (ref 96–106)
Creatinine, Ser: 0.71 mg/dL (ref 0.57–1.00)
GFR calc Af Amer: 124 mL/min/{1.73_m2} (ref >59)
GFR calc non Af Amer: 108 mL/min/{1.73_m2} (ref >59)
Glucose: 85 mg/dL (ref 65–99)
Potassium: 4.2 mmol/L (ref 3.5–5.2)
Sodium: 147 mmol/L — ABNORMAL HIGH (ref 134–144)

## 2019-11-30 ENCOUNTER — Telehealth: Payer: Self-pay | Admitting: Medical

## 2019-11-30 NOTE — Telephone Encounter (Signed)
See form from [employer].  I am confused.  I ordered CT orbit that should be done by now given time senstive nature of the injury!  The form is asking [place] to sumbit a CT report for an order for a CT which doesn't make a lick of sense.  I am trying to order CT, so not sure what they are asking for.

## 2019-12-01 ENCOUNTER — Telehealth: Payer: No Typology Code available for payment source | Admitting: Nurse Practitioner

## 2019-12-01 ENCOUNTER — Encounter: Payer: Self-pay | Admitting: Family Medicine

## 2019-12-01 DIAGNOSIS — M549 Dorsalgia, unspecified: Secondary | ICD-10-CM

## 2019-12-01 DIAGNOSIS — R399 Unspecified symptoms and signs involving the genitourinary system: Secondary | ICD-10-CM

## 2019-12-01 NOTE — Progress Notes (Signed)
Based on what you shared with me it looks like you have uti symptoms with back pain,that should be evaluated in a face to face office visit. Due to the associating back pain you will need a urinalysis and urine culture for proper treatment.    NOTE: If you entered your credit card information for this eVisit, you will not be charged. You may see a "hold" on your card for the $35 but that hold will drop off and you will not have a charge processed.  If you are having a true medical emergency please call 911.     For an urgent face to face visit, [facility] has four urgent care centers for your convenience:   . [facility]    [phone]                  Get Driving Directions  [address] [place], [place] [address] . 10 am to 8 pm Monday-Friday . 12 pm to 8 pm Saturday-Sunday   . [facility]  [phone]                  Get Driving Directions  [address] [place], [place] [address] . 8 am to 8 pm Monday-Friday . 9 am to 6 pm Saturday . 11 am to 6 pm Sunday   . [facility] [place]  [phone]                  Get Driving Directions   [address] [place], [place] [address] . 8 am to 8 pm Monday-Friday . 8 am to 4 pm Saturday-Sunday    . [facility] [place]                    Get Driving Directions  [phone]  [address] [place], [place] [address]  . Monday-Friday, 12 PM to 6 PM    Your e-visit answers were reviewed by a board certified advanced clinical practitioner to complete your personal care plan.  Thank you for using e-Visits.  

## 2019-12-03 MED ORDER — SULFAMETHOXAZOLE-TRIMETHOPRIM 800-160 MG PO TABS: 1.0000 | ORAL_TABLET | Freq: Two times a day (BID) | ORAL | 0 refills | Status: DC

## 2019-12-03 NOTE — Telephone Encounter (Signed)
Mickel Baas can you get with Delfino Lovett on writing a letter to patient.  We have tried multiple times to call her about her CT scan and she is not returning her calls or answering the phone.  So this message will serve as reminder for her to call us back

## 2019-12-03 NOTE — Telephone Encounter (Signed)
Lmom for patient a few weeks ago informing that she could schedule because her insurance has been approved. Also notified [facility] that patient needed to be scheduled because her insurance was approved. Called patient again today and lmom for patient to call and schedule her appointment for imaging if she has not already done so so that we can update her insurance company.

## 2019-12-04 ENCOUNTER — Telehealth: Payer: Self-pay

## 2019-12-04 ENCOUNTER — Encounter: Payer: Self-pay | Admitting: Medical

## 2019-12-04 NOTE — Telephone Encounter (Signed)
Kayla Sexton called to cancel her surgery on 12/19/2019 with Dr. Jacqualyn Posey. She spoke to [place] in the call center. I left her a message that I would cancel her surgery and for her to call me if she would like to reschedule. Notified Cynthia with [facility] and Dr. Jacqualyn Posey.

## 2019-12-04 NOTE — Telephone Encounter (Signed)
Letter done

## 2019-12-07 ENCOUNTER — Ambulatory Visit (INDEPENDENT_AMBULATORY_CARE_PROVIDER_SITE_OTHER)
Admission: RE | Admit: 2019-12-07 | Discharge: 2019-12-07 | Disposition: A | Discharge status: Home / Self Care | Payer: No Typology Code available for payment source | Source: Ambulatory Visit | Attending: Family Medicine | Admitting: Family Medicine

## 2019-12-07 ENCOUNTER — Encounter: Payer: Self-pay | Admitting: Family Medicine

## 2019-12-07 ENCOUNTER — Other Ambulatory Visit: Payer: Self-pay

## 2019-12-07 ENCOUNTER — Ambulatory Visit (INDEPENDENT_AMBULATORY_CARE_PROVIDER_SITE_OTHER): Payer: No Typology Code available for payment source | Admitting: Family Medicine

## 2019-12-07 ENCOUNTER — Other Ambulatory Visit: Payer: Self-pay | Admitting: Family Medicine

## 2019-12-07 VITALS — BP 136/82 | HR 97 | Temp 97.6°F | Ht 62.0 in | Wt 189.0 lb

## 2019-12-07 DIAGNOSIS — Z114 Encounter for screening for human immunodeficiency virus [HIV]: Secondary | ICD-10-CM

## 2019-12-07 DIAGNOSIS — Z1159 Encounter for screening for other viral diseases: Secondary | ICD-10-CM

## 2019-12-07 DIAGNOSIS — R1032 Left lower quadrant pain: Secondary | ICD-10-CM | POA: Diagnosis not present

## 2019-12-07 DIAGNOSIS — E282 Polycystic ovarian syndrome: Secondary | ICD-10-CM | POA: Diagnosis not present

## 2019-12-07 DIAGNOSIS — F32A Depression, unspecified: Secondary | ICD-10-CM

## 2019-12-07 DIAGNOSIS — Z6834 Body mass index (BMI) 34.0-34.9, adult: Secondary | ICD-10-CM

## 2019-12-07 DIAGNOSIS — E6609 Other obesity due to excess calories: Secondary | ICD-10-CM | POA: Diagnosis not present

## 2019-12-07 DIAGNOSIS — R350 Frequency of micturition: Secondary | ICD-10-CM | POA: Diagnosis not present

## 2019-12-07 DIAGNOSIS — F419 Anxiety disorder, unspecified: Secondary | ICD-10-CM

## 2019-12-07 LAB — COMPREHENSIVE METABOLIC PANEL
ALT: 17 U/L (ref 0–35)
AST: 16 U/L (ref 0–37)
Albumin: 4 g/dL (ref 3.5–5.2)
Alkaline Phosphatase: 70 U/L (ref 39–117)
BUN: 12 mg/dL (ref 6–23)
CO2: 29 mEq/L (ref 19–32)
Calcium: 8.4 mg/dL (ref 8.4–10.5)
Chloride: 104 mEq/L (ref 96–112)
Creatinine, Ser: 0.74 mg/dL (ref 0.40–1.20)
GFR: 101.83 mL/min (ref >60.00)
Glucose, Bld: 78 mg/dL (ref 70–99)
Potassium: 3.8 mEq/L (ref 3.5–5.1)
Sodium: 141 mEq/L (ref 135–145)
Total Bilirubin: 0.8 mg/dL (ref 0.2–1.2)
Total Protein: 7.1 g/dL (ref 6.0–8.3)

## 2019-12-07 LAB — CBC WITH DIFFERENTIAL/PLATELET
Basophils Absolute: 0 10*3/uL (ref 0.0–0.1)
Basophils Relative: 0.5 % (ref 0.0–3.0)
Eosinophils Absolute: 0.3 10*3/uL (ref 0.0–0.7)
Eosinophils Relative: 3.5 % (ref 0.0–5.0)
HCT: 45 % (ref 36.0–46.0)
Hemoglobin: 14.8 g/dL (ref 12.0–15.0)
Lymphocytes Relative: 47.1 % — ABNORMAL HIGH (ref 12.0–46.0)
Lymphs Abs: 3.5 10*3/uL (ref 0.7–4.0)
MCHC: 32.9 g/dL (ref 30.0–36.0)
MCV: 86.2 fl (ref 78.0–100.0)
Monocytes Absolute: 0.7 10*3/uL (ref 0.1–1.0)
Monocytes Relative: 10 % (ref 3.0–12.0)
Neutro Abs: 2.9 10*3/uL (ref 1.4–7.7)
Neutrophils Relative %: 38.9 % — ABNORMAL LOW (ref 43.0–77.0)
Platelets: 303 10*3/uL (ref 150.0–400.0)
RBC: 5.22 Mil/uL — ABNORMAL HIGH (ref 3.87–5.11)
RDW: 13.6 % (ref 11.5–15.5)
WBC: 7.4 10*3/uL (ref 4.0–10.5)

## 2019-12-07 LAB — POC URINALSYSI DIPSTICK (AUTOMATED)
Glucose, UA: NEGATIVE
Ketones, UA: NEGATIVE
Leukocytes, UA: NEGATIVE
Nitrite, UA: NEGATIVE
Protein, UA: POSITIVE — AB
Spec Grav, UA: >=1.03 — AB (ref 1.010–1.025)
Urobilinogen, UA: 0.2 E.U./dL
pH, UA: 5.5 (ref 5.0–8.0)

## 2019-12-07 LAB — HEMOGLOBIN A1C: Hgb A1c MFr Bld: 5.7 % (ref 4.6–6.5)

## 2019-12-07 LAB — LIPID PANEL
Cholesterol: 210 mg/dL — ABNORMAL HIGH (ref 0–200)
HDL: 41.6 mg/dL (ref >39.00)
LDL Cholesterol: 147 mg/dL — ABNORMAL HIGH (ref 0–99)
NonHDL: 168.51
Total CHOL/HDL Ratio: 5
Triglycerides: 106 mg/dL (ref 0.0–149.0)
VLDL: 21.2 mg/dL (ref 0.0–40.0)

## 2019-12-07 LAB — TSH: TSH: 1.04 u[IU]/mL (ref 0.35–4.50)

## 2019-12-07 LAB — VITAMIN D 25 HYDROXY (VIT D DEFICIENCY, FRACTURES): VITD: 8.54 ng/mL — ABNORMAL LOW (ref 30.00–100.00)

## 2019-12-07 MED ORDER — SERTRALINE HCL 50 MG PO TABS: 50.0000 mg | ORAL_TABLET | Freq: Every day | ORAL | 3 refills | Status: DC

## 2019-12-07 MED FILL — SERTRALINE HCL 50 MG TABLET: 50 | 30 days supply | Qty: 30 | Fill #0
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
## 2019-12-07 NOTE — Patient Instructions (Signed)
Good to see you today  I will send you a message with labs/ xray report  Start sertraline 1/2 table at night for 5 days then increase to one tablet, can stop wellbutrin Follow up in 6 weeks  There is not one right eating plan for everyone.  It may take trial and error to find what will work for you.  It is important to get adequate protein and fiber with your meals.  It is okay to not eat breakfast or to skip meals if you are not hungry.  Avoid snacking between meals.  Unless you are on a fluid restriction, drink 80 to 90 ounces of water a day.  Suggested resources- www.dietdoctor.com/diabetes/diet www.adaptyourlifeacademy.com-there is a quiz to help you determine how many carbohydrates you should eat a day  www.thefastingmethod.com  If you have diabetes or access to a blood sugar machine, I recommend you check your blood sugar daily and keep a log.  Vary the time you check your blood sugar such as fasting, before meal, 2 hours after a meal and at bedtime.  Look for trends with the foods you are eating and be a scientist of your body.  Here are some guidelines to help you with meal planning -  Avoid all processed and packaged foods (bread, pasta, crackers, chips, etc) and beverages containing calories.  Avoid added sugars and excessive natural sugars.  Pay attention to how you feel if you consume artificial sweeteners.  Do they make you more hungry or raise your blood sugar?  With every meal and snack, aim to get 20 g of protein (3 ounces of meat, 4 ounces of fish, 3 eggs, protein powder, 1 cup [place] yogurt, 1 cup cottage cheese, etc.)  Increase fiber in the form of non-starchy vegetables.  These help you feel full with very little carbohydrates and are good for gut health.  Nonstarchy vegetables include summer squash, onions, peppers, tomatoes, eggplant, broccoli, cauliflower, cabbage, lettuce, spinach.  Have small amounts of good fats such as avocado, nuts, olive oil, nut butters, olives.   Add a little cheese to your meals to make them tasty.   Try to plan your meals for the week and do some meal preparation when able.  If possible, make lunches for the week ahead of time.  Plan a couple of dinners and make enough so you can have leftovers.  Build in a treat once a week.

## 2019-12-10 LAB — INSULIN, RANDOM: Insulin: 21.8 u[IU]/mL — ABNORMAL HIGH

## 2019-12-10 LAB — HEPATITIS C ANTIBODY
Hepatitis C Ab: NONREACTIVE
SIGNAL TO CUT-OFF: 0.02 (ref <1.00)

## 2019-12-10 LAB — HIV ANTIBODY (ROUTINE TESTING W REFLEX): HIV 1&2 Ab, 4th Generation: NONREACTIVE

## 2019-12-18 ENCOUNTER — Ambulatory Visit (INDEPENDENT_AMBULATORY_CARE_PROVIDER_SITE_OTHER): Payer: No Typology Code available for payment source

## 2019-12-18 ENCOUNTER — Encounter: Payer: Self-pay | Admitting: Emergency Medicine

## 2019-12-18 ENCOUNTER — Ambulatory Visit: Payer: No Typology Code available for payment source

## 2019-12-18 ENCOUNTER — Other Ambulatory Visit: Payer: Self-pay | Admitting: Family Medicine

## 2019-12-18 ENCOUNTER — Ambulatory Visit
Admission: EM | Admit: 2019-12-18 | Discharge: 2019-12-18 | Disposition: A | Discharge status: Home / Self Care | Payer: No Typology Code available for payment source | Attending: Emergency Medicine | Admitting: Emergency Medicine

## 2019-12-18 ENCOUNTER — Telehealth: Payer: Self-pay

## 2019-12-18 DIAGNOSIS — E559 Vitamin D deficiency, unspecified: Secondary | ICD-10-CM

## 2019-12-18 DIAGNOSIS — K5900 Constipation, unspecified: Secondary | ICD-10-CM | POA: Diagnosis not present

## 2019-12-18 DIAGNOSIS — R1110 Vomiting, unspecified: Secondary | ICD-10-CM

## 2019-12-18 DIAGNOSIS — R1033 Periumbilical pain: Secondary | ICD-10-CM

## 2019-12-18 MED ORDER — VITAMIN D3 1.25 MG (50000 UT) PO TABS: 1.0000 | ORAL_TABLET | ORAL | 3 refills | Status: DC

## 2019-12-18 MED ORDER — BISACODYL 10 MG RE SUPP: 10.0000 mg | RECTAL | 0 refills | Status: DC | PRN

## 2019-12-18 MED FILL — VIT D3-50 50,000 UNITS CAPS: 1.25 MG | 84 days supply | Qty: 12 | Fill #0

## 2019-12-18 MED FILL — SERTRALINE HCL 50 MG TABLET: 50 | 30 days supply | Qty: 30 | Fill #0

## 2019-12-18 MED FILL — FLUCONAZOLE 150 MG TABS: 150 | 14 days supply | Qty: 2 | Fill #0

## 2019-12-18 MED FILL — CYCLOBENZAPRINE HCL 10 MG T: 10 | 30 days supply | Qty: 30 | Fill #2

## 2019-12-18 NOTE — Telephone Encounter (Signed)
Pt called in, currently at pharmacy to pick up her Rx for vitamin D. No order or mention in chart. Please advise

## 2019-12-18 NOTE — Telephone Encounter (Signed)
RX sent to patients pharmacy

## 2019-12-18 NOTE — ED Triage Notes (Signed)
L flank pain with constipation x 2 days. Reports low grade fever and chills since last night. Her PCP did an xray and stated she had a large stool burden. She has been taking dulcolax and miralax without relief.
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
## 2019-12-18 NOTE — Discharge Instructions (Signed)
There is no evidence of obstruction which is reassuring.  There is decreased stool on your image today, so you are emptying rather than worsening.  Decrease the dulcolax tablets to maximum of one tablet twice a day, as taking too much may be worsening your pain.  I do think you can try using a suppository to try to  more directly empty the colon.  Drink plenty of water.  May use prune juice as you have been, as well as fiber supplementation such as metamucil to help with symptoms.  If worsening of pain I would recommend going to the ER as you may need further evaluation or imaging.

## 2019-12-19 ENCOUNTER — Other Ambulatory Visit: Payer: Self-pay | Admitting: Family Medicine

## 2019-12-19 DIAGNOSIS — R1032 Left lower quadrant pain: Secondary | ICD-10-CM

## 2019-12-19 DIAGNOSIS — K5909 Other constipation: Secondary | ICD-10-CM

## 2019-12-19 DIAGNOSIS — R140 Abdominal distension (gaseous): Secondary | ICD-10-CM

## 2019-12-19 NOTE — Progress Notes (Signed)
Spoke with patient in office. She has had continued abdominal pain, was seen at Urgent Care yesterday. Has taken laxatives, stool softeners and Miralax as recommended with little results. Have suggested mag citrate and ordered CT abdomen.

## 2019-12-24 ENCOUNTER — Encounter: Payer: No Typology Code available for payment source | Admitting: Podiatry

## 2019-12-27 ENCOUNTER — Telehealth: Payer: Self-pay

## 2019-12-27 ENCOUNTER — Other Ambulatory Visit: Payer: Self-pay | Admitting: Family Medicine

## 2019-12-27 DIAGNOSIS — N200 Calculus of kidney: Secondary | ICD-10-CM

## 2019-12-27 DIAGNOSIS — R1032 Left lower quadrant pain: Secondary | ICD-10-CM

## 2019-12-27 DIAGNOSIS — R140 Abdominal distension (gaseous): Secondary | ICD-10-CM

## 2019-12-27 DIAGNOSIS — K5909 Other constipation: Secondary | ICD-10-CM

## 2019-12-27 NOTE — Telephone Encounter (Signed)
Patient has CT abd scheduled for 01/11/20. Patient is in pain, she is having hard time at work due to her symptoms. Patient is wondering if her scan can be changed to urgent to try and speed up the process of her appointment?

## 2019-12-27 NOTE — Telephone Encounter (Signed)
I have reordered the CT to stat.

## 2019-12-28 ENCOUNTER — Other Ambulatory Visit: Payer: Self-pay

## 2019-12-28 ENCOUNTER — Ambulatory Visit
Admission: RE | Admit: 2019-12-28 | Discharge: 2019-12-28 | Disposition: A | Discharge status: Home / Self Care | Payer: No Typology Code available for payment source | Source: Ambulatory Visit | Attending: Family Medicine | Admitting: Family Medicine

## 2019-12-28 ENCOUNTER — Other Ambulatory Visit: Payer: Self-pay | Admitting: Family Medicine

## 2019-12-28 DIAGNOSIS — K5909 Other constipation: Secondary | ICD-10-CM | POA: Diagnosis present

## 2019-12-28 DIAGNOSIS — R140 Abdominal distension (gaseous): Secondary | ICD-10-CM | POA: Diagnosis not present

## 2019-12-28 DIAGNOSIS — R1032 Left lower quadrant pain: Secondary | ICD-10-CM | POA: Diagnosis present

## 2019-12-28 MED ORDER — IOHEXOL 300 MG/ML  SOLN
100.0000 mL | Freq: Once | INTRAMUSCULAR | Status: AC | PRN
  Administered 2019-12-28: 100 mL via INTRAVENOUS

## 2019-12-28 MED ORDER — TAMSULOSIN HCL 0.4 MG PO CAPS: 0.4000 mg | ORAL_CAPSULE | Freq: Every day | ORAL | 3 refills | Status: DC

## 2019-12-28 MED FILL — TAMSULOSIN HCL 0.4 MG CAP: 0.4 | 30 days supply | Qty: 30 | Fill #0

## 2019-12-28 NOTE — Addendum Note (Signed)
Addended by: Clarene Reamer B on: 12/28/2019 01:54 PM   Modules accepted: Orders

## 2019-12-31 ENCOUNTER — Ambulatory Visit (INDEPENDENT_AMBULATORY_CARE_PROVIDER_SITE_OTHER): Payer: No Typology Code available for payment source | Admitting: Urology

## 2019-12-31 ENCOUNTER — Encounter: Payer: Self-pay | Admitting: Urology

## 2019-12-31 ENCOUNTER — Other Ambulatory Visit: Payer: Self-pay

## 2019-12-31 ENCOUNTER — Ambulatory Visit: Payer: No Typology Code available for payment source | Admitting: Family Medicine

## 2019-12-31 VITALS — BP 124/93 | HR 106 | Ht 62.0 in | Wt 188.0 lb

## 2019-12-31 DIAGNOSIS — N200 Calculus of kidney: Secondary | ICD-10-CM

## 2019-12-31 LAB — URINALYSIS, COMPLETE
Bilirubin, UA: NEGATIVE
Glucose, UA: NEGATIVE
Ketones, UA: NEGATIVE
Leukocytes,UA: NEGATIVE
Nitrite, UA: NEGATIVE
Protein,UA: NEGATIVE
Specific Gravity, UA: 1.025 (ref 1.005–1.030)
Urobilinogen, Ur: 0.2 mg/dL (ref 0.2–1.0)
pH, UA: 5 (ref 5.0–7.5)

## 2019-12-31 LAB — MICROSCOPIC EXAMINATION
Epithelial Cells (non renal): >10 /hpf — AB (ref 0–10)
RBC, Urine: >30 /hpf — AB (ref 0–2)

## 2019-12-31 NOTE — Patient Instructions (Signed)
Ureteroscopy Ureteroscopy is a procedure to check for and treat problems inside part of the urinary tract. In this procedure, a thin, tube-shaped instrument with a light at the end (ureteroscope) is used to look at the inside of the kidneys and the ureters, which are the tubes that carry urine from the kidneys to the bladder. The ureteroscope is inserted into one or both of the ureters. You may need this procedure if you have frequent urinary tract infections (UTIs), blood in your urine, or a stone in one of your ureters. A ureteroscopy can be done to find the cause of urine blockage in a ureter and to evaluate other abnormalities inside the ureters or kidneys. If stones are found, they can be removed during the procedure. Polyps, abnormal tissue, and some types of tumors can also be removed or treated. The ureteroscope may also have a tool to remove tissue to be checked for disease under a microscope (biopsy). Tell a health care provider about:  Any allergies you have.  All medicines you are taking, including vitamins, herbs, eye drops, creams, and over-the-counter medicines.  Any problems you or family members have had with anesthetic medicines.  Any blood disorders you have.  Any surgeries you have had.  Any medical conditions you have.  Whether you are pregnant or may be pregnant. What are the risks? Generally, this is a safe procedure. However, problems may occur, including:  Bleeding.  Infection.  Allergic reactions to medicines.  Scarring that narrows the ureter (stricture).  Creating a hole in the ureter (perforation). What happens before the procedure? Staying hydrated Follow instructions from your health care provider about hydration, which may include:  Up to 2 hours before the procedure - you may continue to drink clear liquids, such as water, clear fruit juice, black coffee, and plain tea. Eating and drinking restrictions Follow instructions from your health care  provider about eating and drinking, which may include:  8 hours before the procedure - stop eating heavy meals or foods such as meat, fried foods, or fatty foods.  6 hours before the procedure - stop eating light meals or foods, such as toast or cereal.  6 hours before the procedure - stop drinking milk or drinks that contain milk.  2 hours before the procedure - stop drinking clear liquids. Medicines  Ask your health care provider about: ? Changing or stopping your regular medicines. This is especially important if you are taking diabetes medicines or blood thinners. ? Taking medicines such as aspirin and ibuprofen. These medicines can thin your blood. Do not take these medicines before your procedure if your health care provider instructs you not to.  You may be given antibiotic medicine to help prevent infection. General instructions  You may have a urine sample taken to check for infection.  Plan to have someone take you home from the hospital or clinic. What happens during the procedure?   To reduce your risk of infection: ? Your health care team will wash or sanitize their hands. ? Your skin will be washed with soap.  An IV tube will be inserted into one of your veins.  You will be given one of the following: ? A medicine to help you relax (sedative). ? A medicine to make you fall asleep (general anesthetic). ? A medicine that is injected into your spine to numb the area below and slightly above the injection site (spinal anesthetic).  To lower your risk of infection, you may be given an antibiotic medicine   by an injection or through the IV tube.  The opening from which you urinate (urethra) will be cleaned with a germ-killing solution.  The ureteroscope will be passed through your urethra into your bladder.  A salt-water solution will flow through the ureteroscope to fill your bladder. This will help the health care provider see the openings of your ureters more  clearly.  Then, the ureteroscope will be passed into your ureter. ? If a growth is found, a piece of it may be removed so it can be examined under a microscope (biopsy). ? If a stone is found, it may be removed through the ureteroscope, or the stone may be broken up using a laser, shock waves, or electrical energy. ? In some cases, if the ureter is too small, a tube may be inserted that keeps the ureter open (ureteral stent). The stent may be left in place for 1 or 2 weeks to keep the ureter open, and then the ureteroscopy procedure will be performed.  The scope will be removed, and your bladder will be emptied. The procedure may vary among health care providers and hospitals. What happens after the procedure?  Your blood pressure, heart rate, breathing rate, and blood oxygen level will be monitored until the medicines you were given have worn off.  You may be asked to urinate.  Donot drive for 24 hours if you were given a sedative. This information is not intended to replace advice given to you by your health care provider. Make sure you discuss any questions you have with your health care provider. Document Revised: 12/24/2016 Document Reviewed: 10/24/2015 Elsevier Patient Education  2020 Elsevier Inc.  

## 2019-12-31 NOTE — Progress Notes (Signed)
   12/31/2019 1:06 PM   [name] February 25, 1980 [id number]  Reason for visit: Left renal pelvis stone, flank pain  HPI: I saw Ms. Denison in urology clinic for the above issues.  She is a 39 year old female with recurrent nephrolithiasis who has a history of ureteroscopy on both the left and the right side, as well as not tolerating ureteral stents secondary to severe pain and nausea.  Her PCP ordered a CT on 12/28/2019 for worsening left-sided flank pain over the last 1 to 2 weeks, and CT showed a 1 cm left renal pelvis stone with some perinephric stranding, but no definitive hydronephrosis.  She denies any dysuria, fevers or chills.  Her pain is currently well controlled.  Urinalysis today is contaminated with squamous cells, 6-10 WBCs, greater than 30 RBCs, few bacteria, nitrite negative, no leukocytes.  I personally viewed and interpreted the CT showing a 1 cm left renal pelvis stone, 600HU, not clearly seen on scout CT or recent KUB.  We discussed that she is not a candidate for shockwave lithotripsy, as her stone is not visible on plain film.  We discussed various treatment options for urolithiasis including observation with or without medical expulsive therapy, shockwave lithotripsy (SWL), ureteroscopy and laser lithotripsy with stent placement, and percutaneous nephrolithotomy.  We discussed that management is based on stone size, location, density, patient co-morbidities, and patient preference.   Stones <57mm in size have a >80% spontaneous passage rate. Data surrounding the use of tamsulosin for medical expulsive therapy is controversial, but meta analyses suggests it is most efficacious for distal stones between 5-60mm in size. Possible side effects include dizziness/lightheadedness, and retrograde ejaculation.  SWL has a lower stone free rate in a single procedure, but also a lower complication rate compared to ureteroscopy and avoids a stent and associated stent related symptoms.  Possible complications include renal hematoma, steinstrasse, and need for additional treatment.  Ureteroscopy with laser lithotripsy and stent placement has a higher stone free rate than SWL in a single procedure, however increased complication rate including possible infection, ureteral injury, bleeding, and stent related morbidity. Common stent related symptoms include dysuria, urgency/frequency, and flank pain.  After an extensive discussion of the risks and benefits of the above treatment options, the patient would like to proceed with left ureteroscopy and laser lithotripsy.  I discussed at length that I will do everything I can did not leave a stent in place secondary to her pain from stents previously, but depending on intraoperative findings she may require stent.  Anticipate starting potassium citrate and follow-up for her recurrent stone disease.  We will try to send stone for analysis, consider repeat 24-hour urine in the future.   Billey Co, [facility] [address], [place], [place] [address] [phone]

## 2019-12-31 NOTE — H&P (View-Only) (Signed)
   12/31/2019 1:06 PM   Kayla Sexton April 16, 1980 [id number]  Reason for visit: Left renal pelvis stone, flank pain  HPI: I saw [name] in urology clinic for the above issues.  She is a 39 year old female with recurrent nephrolithiasis who has a history of ureteroscopy on both the left and the right side, as well as not tolerating ureteral stents secondary to severe pain and nausea.  Her PCP ordered a CT on 12/28/2019 for worsening left-sided flank pain over the last 1 to 2 weeks, and CT showed a 1 cm left renal pelvis stone with some perinephric stranding, but no definitive hydronephrosis.  She denies any dysuria, fevers or chills.  Her pain is currently well controlled.  Urinalysis today is contaminated with squamous cells, 6-10 WBCs, greater than 30 RBCs, few bacteria, nitrite negative, no leukocytes.  I personally viewed and interpreted the CT showing a 1 cm left renal pelvis stone, 600HU, not clearly seen on scout CT or recent KUB.  We discussed that she is not a candidate for shockwave lithotripsy, as her stone is not visible on plain film.  We discussed various treatment options for urolithiasis including observation with or without medical expulsive therapy, shockwave lithotripsy (SWL), ureteroscopy and laser lithotripsy with stent placement, and percutaneous nephrolithotomy.  We discussed that management is based on stone size, location, density, patient co-morbidities, and patient preference.   Stones <44mm in size have a >80% spontaneous passage rate. Data surrounding the use of tamsulosin for medical expulsive therapy is controversial, but meta analyses suggests it is most efficacious for distal stones between 5-41mm in size. Possible side effects include dizziness/lightheadedness, and retrograde ejaculation.  SWL has a lower stone free rate in a single procedure, but also a lower complication rate compared to ureteroscopy and avoids a stent and associated stent related symptoms.  Possible complications include renal hematoma, steinstrasse, and need for additional treatment.  Ureteroscopy with laser lithotripsy and stent placement has a higher stone free rate than SWL in a single procedure, however increased complication rate including possible infection, ureteral injury, bleeding, and stent related morbidity. Common stent related symptoms include dysuria, urgency/frequency, and flank pain.  After an extensive discussion of the risks and benefits of the above treatment options, the patient would like to proceed with left ureteroscopy and laser lithotripsy.  I discussed at length that I will do everything I can did not leave a stent in place secondary to her pain from stents previously, but depending on intraoperative findings she may require stent.  Anticipate starting potassium citrate and follow-up for her recurrent stone disease.  We will try to send stone for analysis, consider repeat 24-hour urine in the future.   Billey Co, [facility] [address] [place],  [address] [phone]

## 2020-01-01 ENCOUNTER — Other Ambulatory Visit
Admission: RE | Admit: 2020-01-01 | Discharge: 2020-01-01 | Disposition: A | Discharge status: Home / Self Care | Payer: No Typology Code available for payment source | Source: Ambulatory Visit | Attending: Urology | Admitting: Urology

## 2020-01-01 ENCOUNTER — Encounter: Payer: Self-pay | Admitting: Family Medicine

## 2020-01-01 DIAGNOSIS — Z01812 Encounter for preprocedural laboratory examination: Secondary | ICD-10-CM | POA: Insufficient documentation

## 2020-01-01 DIAGNOSIS — Z20822 Contact with and (suspected) exposure to covid-19: Secondary | ICD-10-CM | POA: Diagnosis not present

## 2020-01-02 ENCOUNTER — Other Ambulatory Visit: Payer: Self-pay | Admitting: Family Medicine

## 2020-01-02 ENCOUNTER — Other Ambulatory Visit: Payer: No Typology Code available for payment source

## 2020-01-02 DIAGNOSIS — N200 Calculus of kidney: Secondary | ICD-10-CM

## 2020-01-02 LAB — SARS CORONAVIRUS 2 (TAT 6-24 HRS): SARS Coronavirus 2: NEGATIVE

## 2020-01-02 MED ORDER — OXYCODONE-ACETAMINOPHEN 5-325 MG PO TABS: 1.0000 | ORAL_TABLET | Freq: Four times a day (QID) | ORAL | 0 refills | Status: DC | PRN

## 2020-01-02 MED FILL — OXYCODONE-APAP 5-325MG: 5-325 | 5 days supply | Qty: 20 | Fill #0

## 2020-01-02 NOTE — Progress Notes (Signed)
04/30/19: Quantiferon: Negative  04/30/19: TB Questionnaire: Normal  04/30/19: Hepatitis B Surface Antibody: N/A for position  04/30/19 Varicella Titer: Immune  04/30/19 Rubella Titer: Immune  04/30/19 Mumps Titer: Immune  04/30/19 Measles Titer: Immune  04/30/19 Color Vision Test: Normal

## 2020-01-03 ENCOUNTER — Other Ambulatory Visit: Payer: Self-pay | Admitting: Urology

## 2020-01-03 ENCOUNTER — Encounter
Admission: RE | Admit: 2020-01-03 | Discharge: 2020-01-03 | Disposition: A | Discharge status: Home / Self Care | Payer: No Typology Code available for payment source | Source: Ambulatory Visit | Attending: Urology | Admitting: Urology

## 2020-01-03 ENCOUNTER — Other Ambulatory Visit: Payer: Self-pay

## 2020-01-03 ENCOUNTER — Encounter: Payer: No Typology Code available for payment source | Admitting: Podiatry

## 2020-01-03 DIAGNOSIS — N200 Calculus of kidney: Secondary | ICD-10-CM

## 2020-01-03 HISTORY — DX: Anemia, unspecified: D64.9

## 2020-01-03 HISTORY — DX: Family history of other specified conditions: Z84.89

## 2020-01-03 NOTE — Patient Instructions (Addendum)
Your procedure is scheduled on:01-04-20 FRIDAY Report to the Registration Desk on the 1st floor of the [facility]. To find out your arrival time, please call [phone] between 1PM - 3PM on: 01-03-20 THURSDAY  REMEMBER: Instructions that are not followed completely may result in serious medical risk, up to and including death; or upon the discretion of your surgeon and anesthesiologist your surgery may need to be rescheduled.  Do not eat food after midnight the night before surgery.  No gum chewing, lozengers or hard candies.  You may however, drink CLEAR liquids up to 2 hours before you are scheduled to arrive for your surgery. Do not drink anything within 2 hours of your scheduled arrival time.  Clear liquids include: - water  - apple juice without pulp - gatorade (not RED, PURPLE, OR BLUE) - black coffee or tea (Do NOT add milk or creamers to the coffee or tea) Do NOT drink anything that is not on this list.  TAKE THESE MEDICATIONS THE MORNING OF SURGERY WITH A SIP OF WATER: -NONE  (take one the night before and one on the morning of surgery - helps to prevent nausea after surgery.)   One week prior to surgery: Stop Anti-inflammatories (NSAIDS) such as Advil, Aleve, Ibuprofen, Motrin, Naproxen, Naprosyn and Aspirin based products such as Excedrin, Goodys Powder, BC Powder-OK TO TAKE TYLENOL IF NEEDED  Stop ANY OVER THE COUNTER supplements until after surgery.   No Alcohol for 24 hours before or after surgery.  No Smoking including e-cigarettes for 24 hours prior to surgery.  No chewable tobacco products for at least 6 hours prior to surgery.  No nicotine patches on the day of surgery.  Do not use any "recreational" drugs for at least a week prior to your surgery.  Please be advised that the combination of cocaine and anesthesia may have negative outcomes, up to and including death. If you test positive for cocaine, your surgery will be cancelled.  On the morning of  surgery brush your teeth with toothpaste and water, you may rinse your mouth with mouthwash if you wish. Do not swallow any toothpaste or mouthwash.  Do not wear jewelry, make-up, hairpins, clips or nail polish.  Do not wear lotions, powders, or perfumes.   Do not shave body from the neck down 48 hours prior to surgery just in case you cut yourself which could leave a site for infection.  Also, freshly shaved skin may become irritated if using the CHG soap.  Contact lenses, hearing aids and dentures may not be worn into surgery.  Do not bring valuables to the hospital. [facility] is not responsible for any missing/lost belongings or valuables.   Notify your doctor if there is any change in your medical condition (cold, fever, infection).  Wear comfortable clothing (specific to your surgery type) to the hospital.  Plan for stool softeners for home use; pain medications have a tendency to cause constipation. You can also help prevent constipation by eating foods high in fiber such as fruits and vegetables and drinking plenty of fluids as your diet allows.  After surgery, you can help prevent lung complications by doing breathing exercises.  Take deep breaths and cough every 1-2 hours. Your doctor may order a device called an Incentive Spirometer to help you take deep breaths. When coughing or sneezing, hold a pillow firmly against your incision with both hands. This is called "splinting." Doing this helps protect your incision. It also decreases belly discomfort.  If you  are being admitted to the hospital overnight, leave your suitcase in the car. After surgery it may be brought to your room.  If you are being discharged the day of surgery, you will not be allowed to drive home. You will need a responsible adult (18 years or older) to drive you home and stay with you that night.   If you are taking public transportation, you will need to have a responsible adult (18 years or older) with  you. Please confirm with your physician that it is acceptable to use public transportation.   Please call the [facility] at [phone] if you have any questions about these instructions.  Visitation Policy:  Patients undergoing a surgery or procedure may have one family member or support person with them as long as that person is not COVID-19 positive or experiencing its symptoms.  That person may remain in the waiting area during the procedure.  Inpatient Visitation Update:   In an effort to ensure the safety of our team members and our patients, we are implementing a change to our visitation policy:  Effective Monday, Aug. 9, at 7 a.m., inpatients will be allowed one support person.  o The support person may change daily.  o The support person must pass our screening, gel in and out, and wear a mask at all times, including in the patient's room.  o Patients must also wear a mask when staff or their support person are in the room.  o Masking is required regardless of vaccination status.  Systemwide, no visitors 17 or younger.

## 2020-01-04 ENCOUNTER — Ambulatory Visit
Admission: RE | Admit: 2020-01-04 | Discharge: 2020-01-04 | Disposition: A | Discharge status: Home / Self Care | Payer: No Typology Code available for payment source | Attending: Urology | Admitting: Urology

## 2020-01-04 ENCOUNTER — Other Ambulatory Visit: Payer: Self-pay

## 2020-01-04 ENCOUNTER — Encounter
Admission: RE | Disposition: A | Discharge status: Home / Self Care | Payer: Self-pay | Source: Home / Self Care | Attending: Urology

## 2020-01-04 ENCOUNTER — Ambulatory Visit: Payer: No Typology Code available for payment source | Admitting: Anesthesiology

## 2020-01-04 ENCOUNTER — Ambulatory Visit: Payer: No Typology Code available for payment source

## 2020-01-04 ENCOUNTER — Encounter: Payer: Self-pay | Admitting: Urology

## 2020-01-04 DIAGNOSIS — N200 Calculus of kidney: Secondary | ICD-10-CM | POA: Diagnosis present

## 2020-01-04 HISTORY — PX: CYSTOSCOPY WITH RETROGRADE URETHROGRAM: SHX6309

## 2020-01-04 HISTORY — PX: CYSTOSCOPY/URETEROSCOPY/HOLMIUM LASER/STENT PLACEMENT: SHX6546

## 2020-01-04 LAB — POCT PREGNANCY, URINE: Preg Test, Ur: NEGATIVE

## 2020-01-04 SURGERY — CYSTOSCOPY/URETEROSCOPY/HOLMIUM LASER/STENT PLACEMENT
Anesthesia: General | Laterality: Left

## 2020-01-04 MED ORDER — DEXAMETHASONE SODIUM PHOSPHATE 10 MG/ML IJ SOLN
INTRAMUSCULAR | Status: AC
  Filled 2020-01-04: qty 1

## 2020-01-04 MED ORDER — HYDROMORPHONE HCL 1 MG/ML IJ SOLN
0.2500 mg | INTRAMUSCULAR | Status: DC | PRN
  Administered 2020-01-04: 0.25 mg via INTRAVENOUS

## 2020-01-04 MED ORDER — SUCCINYLCHOLINE CHLORIDE 200 MG/10ML IV SOSY
PREFILLED_SYRINGE | INTRAVENOUS | Status: AC
  Filled 2020-01-04: qty 10

## 2020-01-04 MED ORDER — FAMOTIDINE 20 MG PO TABS
20.0000 mg | ORAL_TABLET | Freq: Once | ORAL | Status: AC
  Administered 2020-01-04: 20 mg via ORAL

## 2020-01-04 MED ORDER — LIDOCAINE HCL (PF) 2 % IJ SOLN
INTRAMUSCULAR | Status: AC
  Filled 2020-01-04: qty 5

## 2020-01-04 MED ORDER — FENTANYL CITRATE (PF) 100 MCG/2ML IJ SOLN
INTRAMUSCULAR | Status: AC
  Administered 2020-01-04: 25 ug via INTRAVENOUS
  Filled 2020-01-04: qty 2

## 2020-01-04 MED ORDER — ONDANSETRON HCL 4 MG/2ML IJ SOLN
INTRAMUSCULAR | Status: DC | PRN
  Administered 2020-01-04: 4 mg via INTRAVENOUS

## 2020-01-04 MED ORDER — SCOPOLAMINE 1 MG/3DAYS TD PT72
MEDICATED_PATCH | TRANSDERMAL | Status: AC
  Administered 2020-01-04: 1.5 mg via TRANSDERMAL
  Filled 2020-01-04: qty 1

## 2020-01-04 MED ORDER — MIDAZOLAM HCL 2 MG/2ML IJ SOLN
INTRAMUSCULAR | Status: DC | PRN
  Administered 2020-01-04: 2 mg via INTRAVENOUS

## 2020-01-04 MED ORDER — LIDOCAINE HCL URETHRAL/MUCOSAL 2 % EX GEL
CUTANEOUS | Status: DC | PRN
  Administered 2020-01-04: 1

## 2020-01-04 MED ORDER — DEXAMETHASONE SODIUM PHOSPHATE 10 MG/ML IJ SOLN
INTRAMUSCULAR | Status: DC | PRN
  Administered 2020-01-04: 10 mg via INTRAVENOUS

## 2020-01-04 MED ORDER — SCOPOLAMINE 1 MG/3DAYS TD PT72: 1.0000 | MEDICATED_PATCH | Freq: Once | TRANSDERMAL | Status: DC

## 2020-01-04 MED ORDER — LACTATED RINGERS IV SOLN: INTRAVENOUS | Status: DC

## 2020-01-04 MED ORDER — HYDROMORPHONE HCL 1 MG/ML IJ SOLN
INTRAMUSCULAR | Status: AC
  Administered 2020-01-04: 0.25 mg via INTRAVENOUS
  Filled 2020-01-04: qty 1

## 2020-01-04 MED ORDER — DEXMEDETOMIDINE HCL 200 MCG/2ML IV SOLN
INTRAVENOUS | Status: DC | PRN
  Administered 2020-01-04: 20 ug via INTRAVENOUS

## 2020-01-04 MED ORDER — FAMOTIDINE 20 MG PO TABS
ORAL_TABLET | ORAL | Status: AC
  Filled 2020-01-04: qty 1

## 2020-01-04 MED ORDER — CHLORHEXIDINE GLUCONATE 0.12 % MT SOLN
OROMUCOSAL | Status: AC
  Administered 2020-01-04: 15 mL via OROMUCOSAL
  Filled 2020-01-04: qty 15

## 2020-01-04 MED ORDER — ORAL CARE MOUTH RINSE: 15.0000 mL | Freq: Once | OROMUCOSAL | Status: AC

## 2020-01-04 MED ORDER — DEXMEDETOMIDINE (PRECEDEX) IN NS 20 MCG/5ML (4 MCG/ML) IV SYRINGE
PREFILLED_SYRINGE | INTRAVENOUS | Status: AC
  Filled 2020-01-04: qty 5

## 2020-01-04 MED ORDER — CEFAZOLIN SODIUM-DEXTROSE 2-4 GM/100ML-% IV SOLN
2.0000 g | INTRAVENOUS | Status: AC
  Administered 2020-01-04: 2 g via INTRAVENOUS

## 2020-01-04 MED ORDER — ACETAMINOPHEN 10 MG/ML IV SOLN
INTRAVENOUS | Status: AC
  Filled 2020-01-04: qty 100

## 2020-01-04 MED ORDER — ACETAMINOPHEN 10 MG/ML IV SOLN
INTRAVENOUS | Status: DC | PRN
  Administered 2020-01-04: 1000 mg via INTRAVENOUS

## 2020-01-04 MED ORDER — FENTANYL CITRATE (PF) 100 MCG/2ML IJ SOLN
INTRAMUSCULAR | Status: DC | PRN
  Administered 2020-01-04: 100 ug via INTRAVENOUS
  Administered 2020-01-04: 50 ug via INTRAVENOUS
  Administered 2020-01-04: 100 ug via INTRAVENOUS

## 2020-01-04 MED ORDER — OXYCODONE-ACETAMINOPHEN 5-325 MG PO TABS
1.0000 | ORAL_TABLET | Freq: Once | ORAL | Status: AC
  Administered 2020-01-04: 1 via ORAL

## 2020-01-04 MED ORDER — PROPOFOL 10 MG/ML IV BOLUS
INTRAVENOUS | Status: DC | PRN
  Administered 2020-01-04: 150 mg via INTRAVENOUS

## 2020-01-04 MED ORDER — OXYCODONE-ACETAMINOPHEN 5-325 MG PO TABS: 1.0000 | ORAL_TABLET | ORAL | 0 refills | Status: AC | PRN

## 2020-01-04 MED ORDER — FENTANYL CITRATE (PF) 250 MCG/5ML IJ SOLN
INTRAMUSCULAR | Status: AC
  Filled 2020-01-04: qty 5

## 2020-01-04 MED ORDER — ONDANSETRON HCL 4 MG/2ML IJ SOLN
INTRAMUSCULAR | Status: AC
  Filled 2020-01-04: qty 2

## 2020-01-04 MED ORDER — LIDOCAINE HCL (CARDIAC) PF 100 MG/5ML IV SOSY
PREFILLED_SYRINGE | INTRAVENOUS | Status: DC | PRN
  Administered 2020-01-04: 100 mg via INTRAVENOUS

## 2020-01-04 MED ORDER — ONDANSETRON HCL 4 MG/2ML IJ SOLN
4.0000 mg | Freq: Once | INTRAMUSCULAR | Status: AC | PRN
  Administered 2020-01-04: 4 mg via INTRAVENOUS

## 2020-01-04 MED ORDER — MIDAZOLAM HCL 2 MG/2ML IJ SOLN
INTRAMUSCULAR | Status: AC
  Filled 2020-01-04: qty 2

## 2020-01-04 MED ORDER — CHLORHEXIDINE GLUCONATE 0.12 % MT SOLN: 15.0000 mL | Freq: Once | OROMUCOSAL | Status: AC

## 2020-01-04 MED ORDER — FENTANYL CITRATE (PF) 100 MCG/2ML IJ SOLN
25.0000 ug | INTRAMUSCULAR | Status: DC | PRN
  Administered 2020-01-04 (×3): 25 ug via INTRAVENOUS

## 2020-01-04 MED ORDER — LIDOCAINE HCL URETHRAL/MUCOSAL 2 % EX GEL
CUTANEOUS | Status: AC
  Filled 2020-01-04: qty 10

## 2020-01-04 MED ORDER — IOHEXOL 180 MG/ML  SOLN
INTRAMUSCULAR | Status: DC | PRN
  Administered 2020-01-04: 20 mL

## 2020-01-04 MED ORDER — KETOROLAC TROMETHAMINE 30 MG/ML IJ SOLN
INTRAMUSCULAR | Status: DC | PRN
  Administered 2020-01-04: 30 mg via INTRAVENOUS

## 2020-01-04 MED ORDER — PROPOFOL 10 MG/ML IV BOLUS
INTRAVENOUS | Status: AC
  Filled 2020-01-04: qty 20

## 2020-01-04 MED ORDER — SUCCINYLCHOLINE CHLORIDE 20 MG/ML IJ SOLN
INTRAMUSCULAR | Status: DC | PRN
  Administered 2020-01-04: 100 mg via INTRAVENOUS

## 2020-01-04 MED ORDER — CEFAZOLIN SODIUM-DEXTROSE 2-4 GM/100ML-% IV SOLN
INTRAVENOUS | Status: AC
  Filled 2020-01-04: qty 100

## 2020-01-04 MED ORDER — OXYCODONE-ACETAMINOPHEN 5-325 MG PO TABS
ORAL_TABLET | ORAL | Status: AC
  Filled 2020-01-04: qty 1

## 2020-01-04 MED ORDER — POTASSIUM CITRATE ER 15 MEQ (1620 MG) PO TBCR: 1.0000 | EXTENDED_RELEASE_TABLET | Freq: Two times a day (BID) | ORAL | 11 refills | Status: DC

## 2020-01-04 MED FILL — VIT D3-50 50,000 UNITS CAPS: 1.25 MG | 84 days supply | Qty: 12 | Fill #0

## 2020-01-04 SURGICAL SUPPLY — 33 items
BAG DRAIN CYSTO-URO LG1000N (MISCELLANEOUS) ×2 IMPLANT
BRUSH SCRUB EZ 1% IODOPHOR (MISCELLANEOUS) ×2 IMPLANT
CATH URETL 5X70 OPEN END (CATHETERS) IMPLANT
CNTNR SPEC 2.5X3XGRAD LEK (MISCELLANEOUS)
CONT SPEC 4OZ STER OR WHT (MISCELLANEOUS)
CONT SPEC 4OZ STRL OR WHT (MISCELLANEOUS)
CONTAINER SPEC 2.5X3XGRAD LEK (MISCELLANEOUS) IMPLANT
DRAPE UTILITY 15X26 TOWEL STRL (DRAPES) ×2 IMPLANT
FIBER LASER TRAC TIP (UROLOGICAL SUPPLIES) IMPLANT
GLOVE BIOGEL PI IND STRL 7.5 (GLOVE) ×1 IMPLANT
GLOVE BIOGEL PI INDICATOR 7.5 (GLOVE) ×1
GOWN STRL REUS W/ TWL LRG LVL3 (GOWN DISPOSABLE) ×1 IMPLANT
GOWN STRL REUS W/ TWL XL LVL3 (GOWN DISPOSABLE) ×1 IMPLANT
GOWN STRL REUS W/TWL LRG LVL3 (GOWN DISPOSABLE) ×2
GOWN STRL REUS W/TWL XL LVL3 (GOWN DISPOSABLE) ×2
GUIDEWIRE STR DUAL SENSOR (WIRE) ×2 IMPLANT
INFUSOR MANOMETER BAG 3000ML (MISCELLANEOUS) ×2 IMPLANT
INTRODUCER DILATOR DOUBLE (INTRODUCER) IMPLANT
KIT TURNOVER CYSTO (KITS) ×2 IMPLANT
MANIFOLD NEPTUNE II (INSTRUMENTS) ×2 IMPLANT
PACK CYSTO AR (MISCELLANEOUS) ×2 IMPLANT
SET CYSTO W/LG BORE CLAMP LF (SET/KITS/TRAYS/PACK) ×2 IMPLANT
SHEATH URETERAL 12FRX35CM (MISCELLANEOUS) IMPLANT
SOL .9 NS 3000ML IRR  AL (IV SOLUTION) ×2
SOL .9 NS 3000ML IRR AL (IV SOLUTION) ×1
SOL .9 NS 3000ML IRR UROMATIC (IV SOLUTION) ×1 IMPLANT
STENT URET 6FRX24 CONTOUR (STENTS) IMPLANT
STENT URET 6FRX26 CONTOUR (STENTS) IMPLANT
SURGILUBE 2OZ TUBE FLIPTOP (MISCELLANEOUS) ×2 IMPLANT
SYR 10ML LL (SYRINGE) ×2 IMPLANT
TRACTIP FLEXIVA PULSE ID 200 (Laser) ×1 IMPLANT
VALVE UROSEAL ADJ ENDO (VALVE) IMPLANT
WATER STERILE IRR 1000ML POUR (IV SOLUTION) ×2 IMPLANT

## 2020-01-04 NOTE — Discharge Instructions (Signed)

## 2020-01-04 NOTE — Transfer of Care (Signed)
Immediate Anesthesia Transfer of Care Note  Patient: Kayla Sexton  Procedure(s) Performed: CYSTOSCOPY/URETEROSCOPY/HOLMIUM LASER/POSSIBLE STENT PLACEMENT (Left )  Patient Location: PACU  Anesthesia Type:General  Level of Consciousness: sedated  Airway & Oxygen Therapy: Patient Spontanous Breathing and Patient connected to face mask oxygen  Post-op Assessment: Report given to RN and Post -op Vital signs reviewed and stable  Post vital signs: Reviewed and stable  Last Vitals:  Vitals Value Taken Time  BP 123/81 01/04/20 1042  Temp    Pulse 94 01/04/20 1043  Resp 13 01/04/20 1043  SpO2 100 % 01/04/20 1043  Vitals shown include unvalidated device data.  Last Pain:  Vitals:   01/04/20 0750  TempSrc: Temporal  PainSc: 3       Patients Stated Pain Goal: 0 (87/19/59 7471)  Complications: No complications documented.

## 2020-01-04 NOTE — Interval H&P Note (Signed)
UROLOGY H&P UPDATE  Agree with prior H&P dated 12/31/2019.  Left 1 cm renal pelvis stone and intermittent left-sided flank pain.  Cardiac: RRR Lungs: CTA bilaterally  Laterality: Left Procedure: Left ureteroscopy, laser lithotripsy, possible stent placement  Urine: Urinalysis 12/6 with epithelial cells, greater than 30 RBCs, 6-10 WBCs, few bacteria, nitrite negative  We specifically discussed the risks ureteroscopy including bleeding, infection/sepsis, stent related symptoms including flank pain/urgency/frequency/incontinence/dysuria, ureteral injury, inability to access stone, or need for staged or additional procedures.   Billey Co, MD 01/04/2020

## 2020-01-04 NOTE — Anesthesia Procedure Notes (Signed)
Procedure Name: Intubation Performed by: Gaynelle Cage, CRNA Pre-anesthesia Checklist: Patient identified, Emergency Drugs available, Suction available and Patient being monitored Patient Re-evaluated:Patient Re-evaluated prior to induction Oxygen Delivery Method: Circle system utilized Preoxygenation: Pre-oxygenation with 100% oxygen Induction Type: IV induction Laryngoscope Size: McGraph and 3 Grade View: Grade II Tube type: Oral Tube size: 7.0 mm Number of attempts: 1 Airway Equipment and Method: Stylet Placement Confirmation: ETT inserted through vocal cords under direct vision,  positive ETCO2 and breath sounds checked- equal and bilateral Secured at: 21 cm Tube secured with: Tape Dental Injury: Teeth and Oropharynx as per pre-operative assessment

## 2020-01-04 NOTE — Op Note (Signed)
Date of procedure: 01/04/20  Preoperative diagnosis:  1. Left renal pelvis stone  Postoperative diagnosis:  1. Same  Procedure: 1. Cystoscopy, left ureteroscopy, laser lithotripsy, left retrograde pyelogram with intraoperative interpretation  Surgeon: Nickolas Madrid, MD  Anesthesia: General  Complications: None  Intraoperative findings:  1.  Normal cystoscopy 2.  Uncomplicated dusting of left 1 cm renal pelvis stone 3.  Ureter widely patent on pullback ureteroscopy, contrast drained briskly, and no stent placed 4.  Stone appeared to be uric acid   EBL: Minimal  Specimens: None  Drains: None  Indication: Kayla Sexton is a 39 y.o. patient with recurrent stone disease who presented to the ED with left-sided flank pain and a 1 cm left renal pelvis stone likely causing intermittent obstruction.  After reviewing the management options for treatment, they elected to proceed with the above surgical procedure(s). We have discussed the potential benefits and risks of the procedure, side effects of the proposed treatment, the likelihood of the patient achieving the goals of the procedure, and any potential problems that might occur during the procedure or recuperation. Informed consent has been obtained.  Description of procedure:  The patient was taken to the operating room and general anesthesia was induced. SCDs were placed for DVT prophylaxis. The patient was placed in the dorsal lithotomy position, prepped and draped in the usual sterile fashion, and preoperative antibiotics(Ancef) were administered. A preoperative time-out was performed.   A 21 French rigid cystoscope was used to intubate the urethra and thorough cystoscopy was performed.  The bladder was grossly normal.  A sensor wire advanced easily up into the left kidney under fluoroscopic vision.  A single channel digital flexible ureteroscope then advanced easily over the wire up into the kidney.  Thorough pyeloscopy revealed a 1  cm soft yellow stone in the renal pelvis.  A 242 m laser fiber on settings of 0.5 J and 40 Hz was used to dust the stone.  Thorough pyeloscopy revealed no significant residual fragments.  A retrograde pyelogram performed from the proximal ureter showed no extravasation or filling defects.  Careful pullback ureteroscopy demonstrated no ureteral injury or fragments.  I then reinserted the rigid cystoscope and brisk drainage of contrast was seen from the left ureteral orifice.  On fluoroscopy, the contrast appeared to drain well, and I elected not to leave a stent since she has not tolerated stents previously.  Disposition: Stable to PACU  Plan: Suspect uric acid stone based on urine pH of 5 and HU ~500 Start potassium citrate 15 mEq twice daily RTC to clinic 6 months with KUB prior and UA  Nickolas Madrid, MD

## 2020-01-04 NOTE — Anesthesia Preprocedure Evaluation (Signed)
Anesthesia Evaluation  Patient identified by MRN, date of birth, ID band Patient awake    Reviewed: Allergy & Precautions, NPO status , Patient's Chart, lab work & pertinent test results, reviewed documented beta blocker date and time   History of Anesthesia Complications (+) PONV, Family history of anesthesia reaction and history of anesthetic complications  Airway Mallampati: III  TM Distance: >3 FB     Dental  (+) Chipped   Pulmonary sleep apnea ,           Cardiovascular      Neuro/Psych  Headaches, PSYCHIATRIC DISORDERS Anxiety    GI/Hepatic hiatal hernia, GERD  Controlled,  Endo/Other  Morbid obesity  Renal/GU Renal disease     Musculoskeletal   Abdominal   Peds  Hematology   Anesthesia Other Findings Past Medical History: No date: Anemia 2006: Cancer ([place])     Comment:  CERVICAL  No date: CIN III (cervical intraepithelial neoplasia III) No date: Dysuria No date: Family history of adverse reaction to anesthesia     Comment:  DAD AND BROTHER AND MOM-N/V No date: GERD (gastroesophageal reflux disease)     Comment:  OCC No date: Hematuria No date: HH (hiatus hernia) 2006: History of cervical dysplasia     Comment:  CIN1 and CIN2 No date: History of kidney stones No date: Migraines No date: PCOS (polycystic ovarian syndrome)     Comment:  Dr. Helane Rima No date: PONV (postoperative nausea and vomiting)     Comment:  HARD TO WAKE UP No date: Right ureteral calculus No date: Sleep apnea     Comment:  DOES NOT USE CPAP  Reproductive/Obstetrics                             Anesthesia Physical  Anesthesia Plan  ASA: III  Anesthesia Plan: General   Post-op Pain Management:    Induction: Intravenous  PONV Risk Score and Plan:   Airway Management Planned: Oral ETT  Additional Equipment:   Intra-op Plan:   Post-operative Plan: Extubation in OR  Informed Consent: I have  reviewed the patients History and Physical, chart, labs and discussed the procedure including the risks, benefits and alternatives for the proposed anesthesia with the patient or authorized representative who has indicated his/her understanding and acceptance.       Plan Discussed with: CRNA  Anesthesia Plan Comments:         Anesthesia Quick Evaluation

## 2020-01-07 ENCOUNTER — Telehealth: Payer: Self-pay

## 2020-01-07 NOTE — Telephone Encounter (Signed)
Patient called stating that since her surgery on Friday she has not felt well. She had a low grade fever over the weekend and is very fatigued. She has had increased frequency and is passing some fragments. Patient states she has noticed swelling in her leg and is having a cramping pain that at time sends shooting pains. This pain and cramping is now in her arm and hand on the same side.  Spoke with Dr. Diamantina Providence he suggested that she come in to see PA for further evaluation, possible BMP and lower extremity u/s order to rule out DVT. 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

## 2020-01-08 ENCOUNTER — Ambulatory Visit
Admission: RE | Admit: 2020-01-08 | Discharge: 2020-01-08 | Disposition: A | Discharge status: Home / Self Care | Payer: No Typology Code available for payment source | Source: Ambulatory Visit | Attending: Urology | Admitting: Urology

## 2020-01-08 ENCOUNTER — Other Ambulatory Visit: Payer: Self-pay

## 2020-01-08 ENCOUNTER — Ambulatory Visit (INDEPENDENT_AMBULATORY_CARE_PROVIDER_SITE_OTHER): Payer: No Typology Code available for payment source | Admitting: Urology

## 2020-01-08 ENCOUNTER — Ambulatory Visit: Payer: No Typology Code available for payment source | Admitting: Urology

## 2020-01-08 ENCOUNTER — Encounter: Payer: Self-pay | Admitting: Urology

## 2020-01-08 ENCOUNTER — Telehealth: Payer: Self-pay | Admitting: Family Medicine

## 2020-01-08 VITALS — BP 136/94 | HR 116 | Ht 62.0 in | Wt 189.0 lb

## 2020-01-08 DIAGNOSIS — M79661 Pain in right lower leg: Secondary | ICD-10-CM | POA: Diagnosis present

## 2020-01-08 DIAGNOSIS — N201 Calculus of ureter: Secondary | ICD-10-CM

## 2020-01-08 NOTE — Progress Notes (Signed)
01/08/2020 1:38 PM   Kayla Sexton 22-Apr-1980 [id number]  Referring provider: Elby Beck, [place],  [place] [address]  Chief Complaint  Patient presents with  . Nephrolithiasis    HPI: Kayla Sexton is a 39 y.o. female who is status post left ureteroscopy for a 1 cm renal pelvis stone who presents today for an urgent appointment.   Patient underwent a successful left ureteroscopy for a 1 cm renal pelvic stone with Dr. Diamantina Providence on January 04, 2020.  An ureteral stent was not placed after the procedure as contrast drained well and she has been intolerant of stents in the past.  She states she has been experiencing right leg swelling with a cramping pain at times that sends shooting pains up her leg that started on Friday.    She is also feeling fatigued since the procedure.  She had passed some small fragments a few days earlier, but she is now just passing tissue.  UA is positive for micro heme as expected.    PMH: Past Medical History:  Diagnosis Date  . Anemia   . Cancer ([place]) 2006   CERVICAL   . CIN III (cervical intraepithelial neoplasia III)   . Dysuria   . Family history of adverse reaction to anesthesia    DAD AND BROTHER AND MOM-N/V  . GERD (gastroesophageal reflux disease)    OCC  . Hematuria   . HH (hiatus hernia)   . History of cervical dysplasia 2006   CIN1 and CIN2  . History of kidney stones   . Migraines   . PCOS (polycystic ovarian syndrome)    Dr. Helane Rima  . PONV (postoperative nausea and vomiting)    HARD TO WAKE UP  . Right ureteral calculus   . Sleep apnea    DOES NOT USE CPAP    Surgical History: Past Surgical History:  Procedure Laterality Date  . CERVICAL BIOPSY  W/ LOOP ELECTRODE EXCISION  12-04-2004   dr Helane Rima  [facility]  . COLONOSCOPY  03/2008   Dr. Michail Sermon  . COMBINED HYSTEROSCOPY DIAGNOSTIC / D&C  02-28-2012   dr Helane Rima   @ [facility]  . CYSTOSCOPY W/ RETROGRADES Right 03/31/2018   Procedure: CYSTOSCOPY WITH  RETROGRADE PYELOGRAM;  Surgeon: Billey Co, MD;  Location: [facility];  Service: Urology;  Laterality: Right;  . CYSTOSCOPY WITH RETROGRADE PYELOGRAM, URETEROSCOPY AND STENT PLACEMENT Bilateral 07/21/2017   Procedure: CYSTOSCOPY WITH BILATERAL  RETROGRADE BILATERAL URETEROSCOPY AND STENTS  PLACEMENT;  Surgeon: Irine Seal, MD;  Location[facility];  Service: Urology;  Laterality: Bilateral;  . CYSTOSCOPY WITH RETROGRADE URETHROGRAM Left 01/04/2020   Procedure: CYSTOSCOPY WITH RETROGRADE URETHROGRAM;  Surgeon: Billey Co, MD;  Location: [facility];  Service: Urology;  Laterality: Left;  . CYSTOSCOPY/URETEROSCOPY/HOLMIUM LASER/STENT PLACEMENT Left 03/17/2018   Procedure: CYSTOSCOPY/URETEROSCOPY/HOLMIUM LASER/STENT PLACEMENT;  Surgeon: Billey Co, MD;  Location: [facility];  Service: Urology;  Laterality: Left;  . CYSTOSCOPY/URETEROSCOPY/HOLMIUM LASER/STENT PLACEMENT Right 03/31/2018   Procedure: CYSTOSCOPY/URETEROSCOPY/HOLMIUM LASER/STENT PLACEMENT;  Surgeon: Billey Co, MD;  Location: [facility];  Service: Urology;  Laterality: Right;  . CYSTOSCOPY/URETEROSCOPY/HOLMIUM LASER/STENT PLACEMENT Left 01/04/2020   Procedure: CYSTOSCOPY/URETEROSCOPY/HOLMIUM LASER;  Surgeon: Billey Co, MD;  Location: [facility];  Service: Urology;  Laterality: Left;  . endoscopy  2010   Dr. Amedeo Plenty  . FINGER SURGERY Right    MIDDLE FINGER  . FOOT SURGERY Left 2015   5th bunionectomy w/ 2nd digit w/ fusion ,  retained hardware w/ 5th toe  . HOLMIUM LASER APPLICATION Bilateral 0/27/7412   Procedure: HOLMIUM LASER APPLICATION;  Surgeon: Irine Seal, MD;  Location: [facility];  Service: Urology;  Laterality: Bilateral;    Home Medications:  Allergies as of 01/08/2020      Reactions   Adhesive [tape] Swelling   Per pt only the 12 lead ekg pads cause "welps and abrasions"-ALSO SOME BANDAIDS CAUSE IRRITATION-OK TO USE PAPER TAPE   Other    RED MEAT-ANAPHYLAXIS   Vancomycin  Hives, Itching      Medication List       Accurate as of January 08, 2020 11:59 PM. If you have any questions, ask your nurse or doctor.        STOP taking these medications   oxyCODONE-acetaminophen 5-325 MG tablet Commonly known as: PERCOCET/ROXICET Stopped by: Zara Council, PA-C     TAKE these medications   aspirin-acetaminophen-caffeine 250-250-65 MG tablet Commonly known as: EXCEDRIN MIGRAINE Take 1 tablet by mouth every 6 (six) hours as needed for headache.   calcium carbonate 500 MG chewable tablet Commonly known as: TUMS - dosed in mg elemental calcium Chew 1 tablet by mouth daily as needed for indigestion or heartburn.   cyclobenzaprine 10 MG tablet Commonly known as: FLEXERIL Take 10 mg by mouth daily as needed.   ibuprofen 200 MG tablet Commonly known as: ADVIL Take 400 mg by mouth every 6 (six) hours as needed for mild pain or moderate pain.   Potassium Citrate 15 MEQ (1620 MG) Tbcr Take 1 tablet by mouth in the morning and at bedtime.   sertraline 50 MG tablet Commonly known as: ZOLOFT Take 1 tablet (50 mg total) by mouth daily. What changed: when to take this   tamsulosin 0.4 MG Caps capsule Commonly known as: FLOMAX Take 1 capsule (0.4 mg total) by mouth daily.   Unifine Pentips 32G X 4 MM Misc Generic drug: Insulin Pen Needle   Vitamin D3 1.25 MG (50000 UT) Tabs Take 1 tablet by mouth every 7 (seven) days. What changed:   how much to take  Another medication with the same name was removed. Continue taking this medication, and follow the directions you see here.       Allergies:  Allergies  Allergen Reactions  . Adhesive [Tape] Swelling    Per pt only the 12 lead ekg pads cause "welps and abrasions"-ALSO SOME BANDAIDS CAUSE IRRITATION-OK TO USE PAPER TAPE  . Other     RED MEAT-ANAPHYLAXIS  . Vancomycin Hives and Itching    Family History: Family History  Problem Relation Age of Onset  . Healthy Mother   . Healthy Father      Social History:  reports that she has never smoked. She has never used smokeless tobacco. She reports that she does not drink alcohol and does not use drugs.  ROS: Pertinent ROS in HPI  Physical Exam: BP (!) 136/94   Pulse (!) 116   Ht 5\' 2"  (1.575 m)   Wt 189 lb (85.7 kg)   BMI 34.57 kg/m   Constitutional:  Well nourished. Alert and oriented, No acute distress. HEENT: [place] AT, mask in place.  Trachea midline Cardiovascular: No clubbing, cyanosis, or edema. Respiratory: Normal respiratory effort, no increased work of breathing. Neurologic: Grossly intact, no focal deficits, moving all 4 extremities. Psychiatric: Normal mood and affect.  Right calf is tender to palpation with palpable lump in calf.  Dorsal pedis pulse is present.   Laboratory Data: Lab  Results  Component Value Date   WBC 7.4 12/07/2019   HGB 14.8 12/07/2019   HCT 45.0 12/07/2019   MCV 86.2 12/07/2019   PLT 303.0 12/07/2019    Lab Results  Component Value Date   CREATININE 0.76 01/08/2020    Lab Results  Component Value Date   HGBA1C 5.7 12/07/2019    Lab Results  Component Value Date   TSH 1.04 12/07/2019       Component Value Date/Time   CHOL 210 (H) 12/07/2019 0949   HDL 41.60 12/07/2019 0949   CHOLHDL 5 12/07/2019 0949   VLDL 21.2 12/07/2019 0949   LDLCALC 147 (H) 12/07/2019 0949    Lab Results  Component Value Date   AST 16 12/07/2019   Lab Results  Component Value Date   ALT 17 12/07/2019    Urinalysis Component     Latest Ref Rng & Units 01/08/2020          Specific Gravity, UA     1.005 - 1.030 >1.030 (H)  pH, UA     5.0 - 7.5 5.0  Color, UA     Yellow Yellow  Appearance Ur     Clear Clear  Leukocytes,UA     Negative Negative  Protein,UA     Negative/Trace Negative  Glucose, UA     Negative Negative  Ketones, UA     Negative Negative  RBC, UA     Negative 2+ (A)  Bilirubin, UA     Negative Negative  Urobilinogen, Ur     0.2 - 1.0 mg/dL 0.2  Nitrite,  UA     Negative Negative  Microscopic Examination      See below:   Component     Latest Ref Rng & Units 01/08/2020          WBC, UA     0 - 5 /hpf 0-5  RBC     0 - 2 /hpf 3-10 (A)  Epithelial Cells (non renal)     0 - 10 /hpf 0-10  Bacteria, UA     None seen/Few Few  I have reviewed the labs.   Pertinent Imaging: Narrative & Impression  CLINICAL DATA:  Calf region pain and edema  EXAM: RIGHT LOWER EXTREMITY VENOUS DUPLEX ULTRASOUND  TECHNIQUE: Gray-scale sonography with graded compression, as well as color Doppler and duplex ultrasound were performed to evaluate the right lower extremity deep venous system from the level of the common femoral vein and including the common femoral, femoral, profunda femoral, popliteal and calf veins including the posterior tibial, peroneal and gastrocnemius veins when visible. The superficial great saphenous vein was also interrogated. Spectral Doppler was utilized to evaluate flow at rest and with distal augmentation maneuvers in the common femoral, femoral and popliteal veins.  COMPARISON:  None.  FINDINGS: Contralateral Common Femoral Vein: Respiratory phasicity is normal and symmetric with the symptomatic side. No evidence of thrombus. Normal compressibility.  Common Femoral Vein: No evidence of thrombus. Normal compressibility, respiratory phasicity and response to augmentation.  Saphenofemoral Junction: No evidence of thrombus. Normal compressibility and flow on color Doppler imaging.  Profunda Femoral Vein: No evidence of thrombus. Normal compressibility and flow on color Doppler imaging.  Femoral Vein: No evidence of thrombus. Normal compressibility, respiratory phasicity and response to augmentation.  Popliteal Vein: No evidence of thrombus. Normal compressibility, respiratory phasicity and response to augmentation.  Calf Veins: No evidence of thrombus. Normal compressibility and flow on color Doppler  imaging.  Superficial Great Saphenous Vein: No  evidence of thrombus. Normal compressibility.  Venous Reflux:  None.  Other Findings:  None.  IMPRESSION: No evidence of deep venous thrombosis in the right lower extremity. Left common femoral vein also patent.   Electronically Signed   By: Lowella Grip III M.D.   On: 01/08/2020 16:24     Assessment & Plan:    1. Left ureteral stone - s/p left URS - passing fragments - BMP pending  2. Right calf pain/edema - STAT venous doppler to rule out DVT - doppler negative for DVT     No follow-ups on file.  These notes generated with voice recognition software. I apologize for typographical errors.  Zara Council, PA-C  [facility] [address]  [place], [place] [address] [phone]

## 2020-01-08 NOTE — Telephone Encounter (Signed)
-----   Message from Nori Riis, PA-C sent at 01/08/2020  4:34 PM EST ----- Please let Danya know that her doppler was negative for a DVT.

## 2020-01-08 NOTE — Telephone Encounter (Signed)
Patient notified and voiced understanding.

## 2020-01-09 ENCOUNTER — Telehealth: Payer: Self-pay

## 2020-01-09 ENCOUNTER — Ambulatory Visit (INDEPENDENT_AMBULATORY_CARE_PROVIDER_SITE_OTHER): Payer: No Typology Code available for payment source | Admitting: Family Medicine

## 2020-01-09 ENCOUNTER — Encounter: Payer: Self-pay | Admitting: Family Medicine

## 2020-01-09 ENCOUNTER — Telehealth: Payer: Self-pay | Admitting: Family Medicine

## 2020-01-09 DIAGNOSIS — N939 Abnormal uterine and vaginal bleeding, unspecified: Secondary | ICD-10-CM | POA: Diagnosis not present

## 2020-01-09 LAB — URINALYSIS, COMPLETE
Bilirubin, UA: NEGATIVE
Glucose, UA: NEGATIVE
Ketones, UA: NEGATIVE
Leukocytes,UA: NEGATIVE
Nitrite, UA: NEGATIVE
Protein,UA: NEGATIVE
Specific Gravity, UA: >1.03 — ABNORMAL HIGH (ref 1.005–1.030)
Urobilinogen, Ur: 0.2 mg/dL (ref 0.2–1.0)
pH, UA: 5 (ref 5.0–7.5)

## 2020-01-09 LAB — BASIC METABOLIC PANEL
BUN/Creatinine Ratio: 14 (ref 9–23)
BUN: 11 mg/dL (ref 6–20)
CO2: 25 mmol/L (ref 20–29)
Calcium: 9.3 mg/dL (ref 8.7–10.2)
Chloride: 102 mmol/L (ref 96–106)
Creatinine, Ser: 0.76 mg/dL (ref 0.57–1.00)
GFR calc Af Amer: 114 mL/min/{1.73_m2} (ref >59)
GFR calc non Af Amer: 99 mL/min/{1.73_m2} (ref >59)
Glucose: 103 mg/dL — ABNORMAL HIGH (ref 65–99)
Potassium: 3.7 mmol/L (ref 3.5–5.2)
Sodium: 140 mmol/L (ref 134–144)

## 2020-01-09 LAB — MICROSCOPIC EXAMINATION

## 2020-01-09 NOTE — Progress Notes (Signed)
° °  Subjective:    Patient ID: Kayla Sexton, female    DOB: 02/28/1980, 39 y.o.   MRN: [id number]  HPI This is a 39 yo female who presents today for acute visit (works in front of office). She has had vaginal bleeding that started today, has gone through a tampon and a pad. No recent menses for many years due to PCOS. Was previously on various OCPs, had IUD to try to control cycles with no improvement. Has not had a period for many (? 10) years. Gyn Grewal. Reports normal pap 2 years ago. History of abnormal pap many years ago. Has been having some back and abdominal cramping. Of note, had cystoscopy/ ureteroscopy/ holmium laser procedure for right kidney stone on 01/04/2020 (5 days ago). She was seen in urology yesterday with right calf tenderness, had stat venous doppler which was negative.    Review of Systems Per HPI    Objective:   Physical Exam Constitutional:      General: She is not in acute distress.    Appearance: Normal appearance. She is obese. She is not ill-appearing, toxic-appearing or diaphoretic.  HENT:     Head: Normocephalic and atraumatic.  Eyes:     Conjunctiva/sclera: Conjunctivae normal.  Pulmonary:     Effort: Pulmonary effort is normal.  Abdominal:     General: Abdomen is flat.     Palpations: Abdomen is soft.  Genitourinary:    General: Normal vulva.     Exam position: Supine.     Labia:        Right: No rash, tenderness or lesion.        Left: No rash, tenderness or lesion.      Vagina: No signs of injury and foreign body. Bleeding present. No vaginal discharge, erythema, tenderness or lesions.     Cervix: Cervical bleeding (moderate amount) present.     Comments: She is mildly tender on bimanual exam.  Difficult to ascertain size of uterus due to body habitus.  Abdomen is soft. Neurological:     Mental Status: She is alert.           Assessment & Plan:  1. Vaginal bleeding -Could be resumption of menses brought on by stress with recent  procedure -Continue alternating Tylenol and ibuprofen, can use heat -Follow-up with GYN -She had a CT of the abdomen and pelvis on 12/28/2019.  Findings of reproductive organs-uterus and bilateral adnexa are unremarkable.  Dominant follicle in the right ovary.  This visit occurred during the SARS-CoV-2 public health emergency.  Safety protocols were in place, including screening questions prior to the visit, additional usage of staff PPE, and extensive cleaning of exam room while observing appropriate contact time as indicated for disinfecting solutions.    Clarene Reamer, FNP-BC  [facility]  01/09/2020 5:01 PM

## 2020-01-09 NOTE — Telephone Encounter (Signed)
Patient called stating that she is still not feeling well today, she is even more fatigued. She states that while sitting at her desk she had a strange sensation and thought she had possibly peed herself. When she went to the bathroom she saw she had passed a clot the size of her hand in her underwear. It was explained that after URS stone removal some blood clots and stone fragments can be noted in urine and it is possible she experienced a bladder spasm and the clot was passed with the spasm episode. Patient states she does not believe this is the case, she is sure the clot is from her vagina. She states she does not currently get periods because she has PCOS. She is concerned that with surgery that something happened to cause the blood clot to pass. She was reassured that the scope used during surgery is passed through the urethra not the vagina and it would be unlikely to have vaginal bleeding post op. She was encouraged to reach out to her GYN to have further evaluation for vaginal bleeding.

## 2020-01-09 NOTE — Anesthesia Postprocedure Evaluation (Signed)
Anesthesia Post Note  Patient: Kayla Sexton  Procedure(s) Performed: CYSTOSCOPY/URETEROSCOPY/HOLMIUM LASER (Left ) CYSTOSCOPY WITH RETROGRADE URETHROGRAM (Left )  Patient location during evaluation: PACU Anesthesia Type: General Level of consciousness: awake and alert and oriented Pain management: pain level controlled Vital Signs Assessment: post-procedure vital signs reviewed and stable Respiratory status: spontaneous breathing Cardiovascular status: blood pressure returned to baseline Anesthetic complications: no   No complications documented.   Last Vitals:  Vitals:   01/04/20 1236 01/04/20 1311  BP: 117/73 116/80  Pulse: 60 87  Resp: 16 16  Temp: (!) 36.1 C   SpO2: 96% 98%    Last Pain:  Vitals:   01/04/20 1311  TempSrc:   PainSc: 0-No pain                 Elizabethann Lackey

## 2020-01-09 NOTE — Telephone Encounter (Signed)
-----   Message from Nori Riis, PA-C sent at 01/09/2020  8:18 AM EST ----- Please let Jalynne know that her blood work was normal.

## 2020-01-09 NOTE — Telephone Encounter (Signed)
Patient notified and voiced understanding.

## 2020-01-11 ENCOUNTER — Other Ambulatory Visit: Payer: Self-pay

## 2020-01-11 ENCOUNTER — Ambulatory Visit: Payer: No Typology Code available for payment source

## 2020-01-11 ENCOUNTER — Inpatient Hospital Stay (HOSPITAL_COMMUNITY)
Admission: AD | Admit: 2020-01-11 | Discharge: 2020-01-11 | Disposition: A | Discharge status: Home / Self Care | Payer: No Typology Code available for payment source | Attending: Obstetrics and Gynecology | Admitting: Obstetrics and Gynecology

## 2020-01-11 DIAGNOSIS — Z3202 Encounter for pregnancy test, result negative: Secondary | ICD-10-CM | POA: Insufficient documentation

## 2020-01-11 DIAGNOSIS — N939 Abnormal uterine and vaginal bleeding, unspecified: Secondary | ICD-10-CM | POA: Diagnosis not present

## 2020-01-11 LAB — CBC
HCT: 46.2 % — ABNORMAL HIGH (ref 36.0–46.0)
Hemoglobin: 14.8 g/dL (ref 12.0–15.0)
MCH: 28.1 pg (ref 26.0–34.0)
MCHC: 32 g/dL (ref 30.0–36.0)
MCV: 87.8 fL (ref 80.0–100.0)
Platelets: 376 10*3/uL (ref 150–400)
RBC: 5.26 MIL/uL — ABNORMAL HIGH (ref 3.87–5.11)
RDW: 13.1 % (ref 11.5–15.5)
WBC: 9 10*3/uL (ref 4.0–10.5)
nRBC: 0 % (ref 0.0–0.2)

## 2020-01-11 LAB — POCT PREGNANCY, URINE: Preg Test, Ur: NEGATIVE

## 2020-01-11 LAB — COMPREHENSIVE METABOLIC PANEL
ALT: 18 U/L (ref 0–44)
AST: 19 U/L (ref 15–41)
Albumin: 3.5 g/dL (ref 3.5–5.0)
Alkaline Phosphatase: 72 U/L (ref 38–126)
Anion gap: 10 (ref 5–15)
BUN: 10 mg/dL (ref 6–20)
CO2: 24 mmol/L (ref 22–32)
Calcium: 8.6 mg/dL — ABNORMAL LOW (ref 8.9–10.3)
Chloride: 104 mmol/L (ref 98–111)
Creatinine, Ser: 0.81 mg/dL (ref 0.44–1.00)
GFR, Estimated: >60 mL/min (ref >60)
Glucose, Bld: 88 mg/dL (ref 70–99)
Potassium: 4.4 mmol/L (ref 3.5–5.1)
Sodium: 138 mmol/L (ref 135–145)
Total Bilirubin: 1.3 mg/dL — ABNORMAL HIGH (ref 0.3–1.2)
Total Protein: 6.7 g/dL (ref 6.5–8.1)

## 2020-01-11 MED ORDER — MEGESTROL ACETATE 40 MG PO TABS: 80.0000 mg | ORAL_TABLET | Freq: Two times a day (BID) | ORAL | 0 refills | Status: AC

## 2020-01-11 MED ORDER — MEGESTROL ACETATE 40 MG PO TABS: 40.0000 mg | ORAL_TABLET | Freq: Two times a day (BID) | ORAL | 0 refills | Status: AC

## 2020-01-11 NOTE — MAU Provider Note (Signed)
Event Date/Time   First Provider Initiated Contact with Patient 01/11/20 0936      S [name] is a 39 y.o. nonpregnant patient who presents to MAU today with complaint of onset of heavy menstrual bleeding 2 days ago. She had surgery with kidney stone removal 7 days ago. She has PCOS and does not usually have periods so this is unusual. She is soaking a super plus tampon and a pad every 20-30 minutes with large clots the size of her hand.  There is mild abdominal cramping associated with the bleeding. She called her PCP office and was told to come to MAU. She reports she is weak and shaky and does not feel well after the 2 days of heavy bleeding.  O BP (!) 124/95   Pulse 91   Temp 98.2 F (36.8 C)   Resp 17   SpO2 96%  Physical Exam Vitals and nursing note reviewed.  Constitutional:      Appearance: She is well-developed and well-nourished.  Cardiovascular:     Rate and Rhythm: Normal rate.  Pulmonary:     Effort: Pulmonary effort is normal.  Abdominal:     Palpations: Abdomen is soft.  Musculoskeletal:        General: Normal range of motion.     Cervical back: Normal range of motion.  Skin:    General: Skin is warm and dry.  Neurological:     Mental Status: She is alert and oriented to person, place, and time.  Psychiatric:        Mood and Affect: Mood and affect normal.        Behavior: Behavior normal.        Thought Content: Thought content normal.        Judgment: Judgment normal.     A Medical screening exam complete 1. Abnormal uterine bleeding (AUB)   2. Negative pregnancy test     P CBC confirms pt stable condition, hgb 14.8 today Discharge from MAU in stable condition Patient given the option of transfer to [facility] for further evaluation or seek care in outpatient facility of choice  Rx for Megace to start today, 3 days of 80 mg bid followed by 40 mg bid for the rest of the month. Pt to f/u with PCP and/or [facility] Return to [facility] for  emergencies Warning signs for worsening condition that would warrant emergency follow-up discussed   Elvera Maria, CNM 01/11/2020 9:44 AM

## 2020-01-11 NOTE — MAU Note (Signed)
Patient states she had surgery last Friday for kidney stone removal.  Passed several large blood clots on Wednesday and has been having a significant increase in bleeding since then.  Patient endorses weakness and abdominal/lower back cramping.  Patient states she is not pregnant.

## 2020-01-12 ENCOUNTER — Telehealth: Payer: Self-pay | Admitting: Family Medicine

## 2020-01-12 NOTE — Telephone Encounter (Signed)
Patient called and reported continued vaginal bleeding.  It had slowed down after the first dose of Megace and then heavy flow resumed last night and this morning.  Reviewed MAU provider note from yesterday as well as labs.  Will expect bleeding to significantly decrease within the next 24 hours.  She can add ibuprofen 600 mg every 8-12 hours.  Follow-up precautions reviewed.

## 2020-01-14 ENCOUNTER — Telehealth: Payer: Self-pay | Admitting: Family Medicine

## 2020-01-14 LAB — CULTURE, URINE COMPREHENSIVE

## 2020-01-14 MED ORDER — NITROFURANTOIN MONOHYD MACRO 100 MG PO CAPS: 100.0000 mg | ORAL_CAPSULE | Freq: Two times a day (BID) | ORAL | 0 refills | Status: AC

## 2020-01-14 NOTE — Telephone Encounter (Signed)
Notified patient as instructed, patient pleased.Send Macrobid in .

## 2020-01-14 NOTE — Telephone Encounter (Signed)
Spoke to patient and informed her of positive urine culture. The line disconnected, I attempted to call patient again. The phone went to voice mail. I left message informing patient the ABX will be sent to [facility] outpatient pharmacy.

## 2020-01-14 NOTE — Telephone Encounter (Signed)
-----   Message from Nori Riis, PA-C sent at 01/14/2020  8:28 AM EST ----- Please let Mrs. Culmer know that her urine culture was positive for infection and needs to start Macrobid 100 mg, twice daily x seven days.

## 2020-01-17 ENCOUNTER — Encounter: Payer: No Typology Code available for payment source | Admitting: Podiatry

## 2020-01-24 MED FILL — MEDROXYPROGESTERONE 10 MG T: 10 | 69 days supply | Qty: 90 | Fill #0

## 2020-01-30 ENCOUNTER — Encounter: Payer: Self-pay | Admitting: Family Medicine

## 2020-01-30 DIAGNOSIS — R7303 Prediabetes: Secondary | ICD-10-CM | POA: Insufficient documentation

## 2020-02-04 ENCOUNTER — Telehealth: Payer: Self-pay | Admitting: Family Medicine

## 2020-02-04 NOTE — Telephone Encounter (Signed)
-----   Message from Nori Riis, PA-C sent at 02/04/2020  8:40 AM EST ----- Would you have Kayla Sexton drop off an UA so we can check to make sure her urine has cleared from her infection?

## 2020-02-04 NOTE — Telephone Encounter (Signed)
Patient notified and she will have to call back. She has been tested for Covid and is sick.

## 2020-02-06 ENCOUNTER — Ambulatory Visit: Payer: No Typology Code available for payment source | Admitting: Family Medicine

## 2020-02-29 ENCOUNTER — Encounter: Payer: Self-pay | Admitting: Obstetrics and Gynecology

## 2020-03-04 ENCOUNTER — Encounter: Payer: Self-pay | Admitting: Family Medicine

## 2020-03-04 ENCOUNTER — Ambulatory Visit (INDEPENDENT_AMBULATORY_CARE_PROVIDER_SITE_OTHER): Payer: No Typology Code available for payment source | Admitting: Family Medicine

## 2020-03-04 ENCOUNTER — Other Ambulatory Visit: Payer: Self-pay

## 2020-03-04 VITALS — BP 110/80 | HR 92 | Temp 97.4°F | Ht 62.0 in | Wt 189.0 lb

## 2020-03-04 DIAGNOSIS — E282 Polycystic ovarian syndrome: Secondary | ICD-10-CM | POA: Diagnosis not present

## 2020-03-04 DIAGNOSIS — F988 Other specified behavioral and emotional disorders with onset usually occurring in childhood and adolescence: Secondary | ICD-10-CM

## 2020-03-04 DIAGNOSIS — S29011A Strain of muscle and tendon of front wall of thorax, initial encounter: Secondary | ICD-10-CM | POA: Diagnosis not present

## 2020-03-04 NOTE — Assessment & Plan Note (Addendum)
Treated as child, but no formal psych evaluation as an adult. Difficulty with focusing. Referral to psych for formal evaluation. Return after to discuss results and consider medication options

## 2020-03-04 NOTE — Patient Instructions (Addendum)
Take Advil 600-800 mg every 8 hours Peck stretch in doorway hold for 10-30 seconds, do 3 times a day   [place] location:  Dr. Nicolasa Ducking (psychiatry service only)- [phone]- 18 years and older. send referral and then patient to call directly to schedule. Insurances accepted: [employer], [employer], [employer], [employer], [employer].  [facility] [phone]-  -Counseling/therapy (14 years and up- Medicaid insurance only)   -Psychiatry services most insurances accepted-treat and evaluate/diagnose patients and prescribe medications.  -Medication Management 40 years old to 65 years.  [place] [phone]- All ages-Psychology and Psychiatrist services (evaluate, treat, diagnose, prescribe medication)  For new patients, on Monday, Wednesday and Friday from 8 am to 3 pm patient would just walk in and fill out paperwork and they would get patient enrolled in the services they need based on their answers.   [employer] [phone]- All ages. Monday through Friday-9am to 4 pm walk in times for patients to come in and be evaluated. Medication Management only at this time.   [facility] - Psychiatry   [facility] location:   [facility] Address: [place] [address] [place], [place] [address] Phone: [phone] Psychiatrist service-treats/evaluates ADHD/ADD, depression/anxiety, etc.     [facility] Address: [address], [place] [address] Phone: [phone] Counseling and psychiatry services.   [phone]- Testing and medication management for ADD/ADHD only.    [facility]- [phone]- Adults only. provide comprehensive psychological evaluations for a variety of cognitive, educational, emotional, and behavioral difficulties for adults.

## 2020-03-04 NOTE — Assessment & Plan Note (Signed)
S/p 1 month of abnormal uterine bleeding. Has GYN f/u but had to cancel due to respiratory illness. Encouraged continued follow-up.

## 2020-03-04 NOTE — Assessment & Plan Note (Signed)
Tenderness over the pectoralis muscle. Advised advil and daily stretching. If no improvement in 2 weeks consider PT or sports medicine

## 2020-03-04 NOTE — Progress Notes (Signed)
Subjective:     Kayla Sexton is a 40 y.o. female presenting for Muscle Pain (In chest x 3 weeks, post illness )     HPI   #Muslce pain - chest pressure on the right side of the chest and radiates to the back - feels like a muscle pull - for 3 weeks - worse with movement and taking a deep breath - feels like an elephant when she takes a breath - treatment: heat/ice, as needed advil -- with little improvement - no sob, palpitations - no anxiety - taking cyclobenzaprine - has not helped with chest pain  #abnormal uterine bleeding - resolved  #ADHD - had this as a child - tried adderall 4-5 years ago which helped - has anxiety/depression and zoloft helps but still difficulty with memory and focus - interested in medication   Review of Systems   Social History   Tobacco Use  Smoking Status Never Smoker  Smokeless Tobacco Never Used        Objective:    BP Readings from Last 3 Encounters:  03/04/20 110/80  01/11/20 (!) 124/95  01/08/20 (!) 136/94   Wt Readings from Last 3 Encounters:  03/04/20 189 lb (85.7 kg)  01/08/20 189 lb (85.7 kg)  01/04/20 189 lb (85.7 kg)    BP 110/80   Pulse 92   Temp (!) 97.4 F (36.3 C) (Temporal)   Ht 5\' 2"  (1.575 m)   Wt 189 lb (85.7 kg)   SpO2 100%   BMI 34.57 kg/m    Physical Exam Constitutional:      General: She is not in acute distress.    Appearance: She is well-developed. She is not diaphoretic.  HENT:     Right Ear: External ear normal.     Left Ear: External ear normal.     Nose: Nose normal.  Eyes:     Conjunctiva/sclera: Conjunctivae normal.  Cardiovascular:     Rate and Rhythm: Normal rate and regular rhythm.     Heart sounds: No murmur heard.   Pulmonary:     Effort: Pulmonary effort is normal. No respiratory distress.     Breath sounds: Normal breath sounds. No wheezing.  Chest:     Chest wall: Tenderness (along the right pectoralis muscle) present. No mass or deformity.     Comments:  Muscle pain worse with lifting arm to 90 degrees Musculoskeletal:     Cervical back: Neck supple.  Skin:    General: Skin is warm and dry.     Capillary Refill: Capillary refill takes less than 2 seconds.  Neurological:     Mental Status: She is alert. Mental status is at baseline.  Psychiatric:        Mood and Affect: Mood normal.        Behavior: Behavior normal.           Assessment & Plan:   Problem List Items Addressed This Visit      Endocrine   PCOS (polycystic ovarian syndrome)    S/p 1 month of abnormal uterine bleeding. Has GYN f/u but had to cancel due to respiratory illness. Encouraged continued follow-up.         Musculoskeletal and Integument   Strain of right pectoralis muscle - Primary    Tenderness over the pectoralis muscle. Advised advil and daily stretching. If no improvement in 2 weeks consider PT or sports medicine        Other   ADD (attention deficit disorder)  Treated as child, but no formal psych evaluation as an adult. Difficulty with focusing. Referral to psych for formal evaluation. Return after to discuss results and consider medication options          Return if symptoms worsen or fail to improve.  Lesleigh Noe, MD  This visit occurred during the SARS-CoV-2 public health emergency.  Safety protocols were in place, including screening questions prior to the visit, additional usage of staff PPE, and extensive cleaning of exam room while observing appropriate contact time as indicated for disinfecting solutions.

## 2020-03-11 ENCOUNTER — Ambulatory Visit: Payer: No Typology Code available for payment source | Admitting: Family Medicine

## 2020-03-13 ENCOUNTER — Encounter: Payer: Self-pay | Admitting: Primary Care

## 2020-03-13 ENCOUNTER — Ambulatory Visit (INDEPENDENT_AMBULATORY_CARE_PROVIDER_SITE_OTHER): Payer: No Typology Code available for payment source | Admitting: Primary Care

## 2020-03-13 ENCOUNTER — Other Ambulatory Visit: Payer: Self-pay

## 2020-03-13 VITALS — BP 108/60 | HR 94 | Temp 97.6°F | Ht 62.0 in | Wt 189.0 lb

## 2020-03-13 DIAGNOSIS — R7303 Prediabetes: Secondary | ICD-10-CM | POA: Diagnosis not present

## 2020-03-13 DIAGNOSIS — L03311 Cellulitis of abdominal wall: Secondary | ICD-10-CM | POA: Diagnosis not present

## 2020-03-13 DIAGNOSIS — L98492 Non-pressure chronic ulcer of skin of other sites with fat layer exposed: Secondary | ICD-10-CM | POA: Diagnosis not present

## 2020-03-13 LAB — POCT GLYCOSYLATED HEMOGLOBIN (HGB A1C): Hemoglobin A1C: 5.3 % (ref 4.0–5.6)

## 2020-03-13 MED ORDER — SULFAMETHOXAZOLE-TRIMETHOPRIM 800-160 MG PO TABS: 1.0000 | ORAL_TABLET | Freq: Two times a day (BID) | ORAL | 0 refills | Status: DC

## 2020-03-13 MED ORDER — SILVER SULFADIAZINE 1 % EX CREA: 1.0000 "application " | TOPICAL_CREAM | Freq: Every day | CUTANEOUS | 0 refills | Status: DC

## 2020-03-13 NOTE — Assessment & Plan Note (Signed)
Small skin ulceration noted to right lower abdomen, surrounding erythema suggestive of cellulitis.  Treat with oral Bactrim DS tablets x10 days and silver Silvadene cream daily.  Recommend she follow closely with her PCP for follow-up.  Checking A1c today.

## 2020-03-13 NOTE — Patient Instructions (Signed)
Stop by the lab prior to leaving today. I will notify you of your results once received.   Start Bactrim DS (sulfamethoxazole/trimethoprim) tablets for infection. Take 1 tablet by mouth twice daily for 10 days.  You may apply the silver Silvadene cream once a day for the next several weeks.  Please follow-up closely with your PCP in about 1 week.  It was a pleasure to see you today!

## 2020-03-13 NOTE — Addendum Note (Signed)
Addended by: Cloyd Stagers on: 03/13/2020 12:57 PM   Modules accepted: Orders

## 2020-03-13 NOTE — Progress Notes (Signed)
Subjective:    Patient ID: Kayla Sexton, female    DOB: [birth year], 39 y.o.   MRN: [id number]  HPI  This visit occurred during the SARS-CoV-2 public health emergency.  Safety protocols were in place, including screening questions prior to the visit, additional usage of staff PPE, and extensive cleaning of exam room while observing appropriate contact time as indicated for disinfecting solutions.   Kayla Sexton is a very pleasant 40 year old female patient of Dr. Einar Pheasant with a history of prediabetes, PCOS, OSA who presents today with a chief complaint of skin infection.  She first noticed a small spot to her right lower abdomen about a week ago which appeared to look like an "acne bump". The following morning she saw that the bump had popped, and since then she's noticed erythema, pain, warmth.  She has applied witch hazel and topical antibiotic ointment without improvement.  She has been covering the wound with a bandage for most of the time, sometimes allows the wound to air out.  She has a history of these bumps occurring in the past, but they have never resulted in a wound leg when she has now.  She has a history of PCOS and prediabetes, last A1c was 5.7 in mid November 2021.  Review of Systems  Constitutional: Negative for fever.  Skin: Positive for color change and wound.       Past Medical History:  Diagnosis Date  . Anemia   . Cancer ([place]) 2006   CERVICAL   . CIN III (cervical intraepithelial neoplasia III)   . Dysuria   . Family history of adverse reaction to anesthesia    DAD AND BROTHER AND MOM-N/V  . GERD (gastroesophageal reflux disease)    OCC  . Hematuria   . HH (hiatus hernia)   . History of cervical dysplasia 2006   CIN1 and CIN2  . History of kidney stones   . Migraines   . PCOS (polycystic ovarian syndrome)    Dr. Helane Rima  . PONV (postoperative nausea and vomiting)    HARD TO WAKE UP  . Right ureteral calculus   . Sleep apnea    DOES NOT USE CPAP      Social History   Socioeconomic History  . Marital status: Single    Spouse name: Not on file  . Number of children: Not on file  . Years of education: Not on file  . Highest education level: Not on file  Occupational History  . Not on file  Tobacco Use  . Smoking status: Never Smoker  . Smokeless tobacco: Never Used  Vaping Use  . Vaping Use: Never used  Substance and Sexual Activity  . Alcohol use: No  . Drug use: No  . Sexual activity: Yes    Birth control/protection: None  Other Topics Concern  . Not on file  Social History Narrative  . Not on file   Social Determinants of Health   Financial Resource Strain: Not on file  Food Insecurity: Not on file  Transportation Needs: Not on file  Physical Activity: Not on file  Stress: Not on file  Social Connections: Not on file  Intimate Partner Violence: Not on file    Past Surgical History:  Procedure Laterality Date  . CERVICAL BIOPSY  W/ LOOP ELECTRODE EXCISION  12-04-2004   dr Helane Rima  [facility]  . COLONOSCOPY  03/2008   Dr. Michail Sermon  . COMBINED HYSTEROSCOPY DIAGNOSTIC / D&C  02-28-2012   dr Helane Rima   @  [facility]  . CYSTOSCOPY W/ RETROGRADES Right 03/31/2018   Procedure: CYSTOSCOPY WITH RETROGRADE PYELOGRAM;  Surgeon: Billey Co, MD;  Location: [facility];  Service: Urology;  Laterality: Right;  . CYSTOSCOPY WITH RETROGRADE PYELOGRAM, URETEROSCOPY AND STENT PLACEMENT Bilateral 07/21/2017   Procedure: CYSTOSCOPY WITH BILATERAL  RETROGRADE BILATERAL URETEROSCOPY AND STENTS  PLACEMENT;  Surgeon: Irine Seal, MD;  Location: [facility];  Service: Urology;  Laterality: Bilateral;  . CYSTOSCOPY WITH RETROGRADE URETHROGRAM Left 01/04/2020   Procedure: CYSTOSCOPY WITH RETROGRADE URETHROGRAM;  Surgeon: Billey Co, MD;  Location: [facility];  Service: Urology;  Laterality: Left;  . CYSTOSCOPY/URETEROSCOPY/HOLMIUM LASER/STENT PLACEMENT Left 03/17/2018   Procedure: CYSTOSCOPY/URETEROSCOPY/HOLMIUM LASER/STENT PLACEMENT;   Surgeon: Billey Co, MD;  Location: [facility];  Service: Urology;  Laterality: Left;  . CYSTOSCOPY/URETEROSCOPY/HOLMIUM LASER/STENT PLACEMENT Right 03/31/2018   Procedure: CYSTOSCOPY/URETEROSCOPY/HOLMIUM LASER/STENT PLACEMENT;  Surgeon: Billey Co, MD;  Location: [facility];  Service: Urology;  Laterality: Right;  . CYSTOSCOPY/URETEROSCOPY/HOLMIUM LASER/STENT PLACEMENT Left 01/04/2020   Procedure: CYSTOSCOPY/URETEROSCOPY/HOLMIUM LASER;  Surgeon: Billey Co, MD;  Location: [facility];  Service: Urology;  Laterality: Left;  . endoscopy  2010   Dr. Amedeo Plenty  . FINGER SURGERY Right    MIDDLE FINGER  . FOOT SURGERY Left 2015   5th bunionectomy w/ 2nd digit w/ fusion , retained hardware w/ 5th toe  . HOLMIUM LASER APPLICATION Bilateral 6/78/9381   Procedure: HOLMIUM LASER APPLICATION;  Surgeon: Irine Seal, MD;  Location: [facility];  Service: Urology;  Laterality: Bilateral;    Family History  Problem Relation Age of Onset  . Healthy Mother   . Healthy Father     Allergies  Allergen Reactions  . Adhesive [Tape] Swelling    Per pt only the 12 lead ekg pads cause "welps and abrasions"-ALSO SOME BANDAIDS CAUSE IRRITATION-OK TO USE PAPER TAPE  . Other     RED MEAT-ANAPHYLAXIS  . Vancomycin Hives and Itching    Current Outpatient Medications on File Prior to Visit  Medication Sig Dispense Refill  . aspirin-acetaminophen-caffeine (EXCEDRIN MIGRAINE) 250-250-65 MG tablet Take 1 tablet by mouth every 6 (six) hours as needed for headache.    . Cholecalciferol (VITAMIN D3) 1.25 MG (50000 UT) TABS Take 1 tablet by mouth every 7 (seven) days. (Patient taking differently: Take 50,000 Units by mouth every 7 (seven) days.) 12 tablet 3  . cyclobenzaprine (FLEXERIL) 10 MG tablet Take 10 mg by mouth daily as needed.    Marland Kitchen ibuprofen (ADVIL,MOTRIN) 200 MG tablet Take 400 mg by mouth every 6 (six) hours as needed for mild pain or moderate pain.     Marland Kitchen Potassium Citrate 15 MEQ (1620  MG) TBCR Take 1 tablet by mouth in the morning and at bedtime. 60 tablet 11  . sertraline (ZOLOFT) 50 MG tablet Take 1 tablet (50 mg total) by mouth daily. (Patient taking differently: Take 50 mg by mouth at bedtime.) 30 tablet 3   No current facility-administered medications on file prior to visit.    BP 108/60   Pulse 94   Temp 97.6 F (36.4 C) (Temporal)   Ht 5\' 2"  (1.575 m)   Wt 189 lb (85.7 kg)   SpO2 100%   BMI 34.57 kg/m    Objective:   Physical Exam Constitutional:      Appearance: She is not ill-appearing.  Skin:    Findings: Erythema present.     Comments: 0.5 cm rounded ulceration with fat layer exposed, located to right lower abdomen proximal  to groin.  Surrounding erythema extending approximately 1 cm.  Tender.  Warmth noted.  No drainage.  Neurological:     Mental Status: She is alert.            Assessment & Plan:

## 2020-03-14 ENCOUNTER — Telehealth: Payer: Self-pay

## 2020-03-14 ENCOUNTER — Ambulatory Visit: Payer: No Typology Code available for payment source

## 2020-03-14 DIAGNOSIS — T7840XA Allergy, unspecified, initial encounter: Secondary | ICD-10-CM

## 2020-03-14 DIAGNOSIS — L03311 Cellulitis of abdominal wall: Secondary | ICD-10-CM

## 2020-03-14 MED ORDER — DIPHENHYDRAMINE HCL 12.5 MG/5ML PO ELIX
25.0000 mg | ORAL_SOLUTION | Freq: Once | ORAL | Status: DC
  Administered 2020-03-14: 25 mg via ORAL

## 2020-03-14 MED ORDER — DIPHENHYDRAMINE HCL 12.5 MG/5ML PO ELIX
25.0000 mg | ORAL_SOLUTION | Freq: Once | ORAL | Status: DC
Start: 2020-03-14 — End: 2020-03-14

## 2020-03-14 MED ORDER — CEPHALEXIN 500 MG PO CAPS: 500.0000 mg | ORAL_CAPSULE | Freq: Two times a day (BID) | ORAL | 0 refills | Status: DC

## 2020-03-14 NOTE — Progress Notes (Signed)
Patient was in office and presented hives on chest and neck. She has started taking bactrim yesterday. Patient is not having any issues with throat tightness, shortness of breath or swelling in face. Verbal order given by Dr. Danise Mina to give patient 25mg  of children's liquid allergy medications. All that was available in office. Patient was given 59ml oral by Rena. Reviewed with PCP and patient has been informed of next steps of care as documented in open phone notes.

## 2020-03-14 NOTE — Telephone Encounter (Signed)
Noted. Tell her to STOP the Bactrim antibiotics, add bactrim to her allergy list. Agree with Benadryl.  She stopped by the office today and notified me that she thought her wound was improving, what was improving, the redness?

## 2020-03-14 NOTE — Telephone Encounter (Signed)
Patient states that she has started breaking out in hlves and rash all over neck. Started on Bactrim yesterday. Denies any Sob, swelling in face, mouth or tongue. Per verbal order From Dr. Darnell Level patient given diphenhydramine hcl 25mg . We only had liquid in office and was administered by [place]. If patient has any red words will be evaluated.    Pt given 7ml per Dr. Darnell Level

## 2020-03-14 NOTE — Telephone Encounter (Signed)
Informed patient states that size and redness has improved. Will d/c bactrim.

## 2020-03-26 ENCOUNTER — Ambulatory Visit: Payer: No Typology Code available for payment source | Admitting: Family Medicine

## 2020-03-31 ENCOUNTER — Ambulatory Visit: Payer: No Typology Code available for payment source | Admitting: Family Medicine

## 2020-04-01 ENCOUNTER — Encounter: Payer: No Typology Code available for payment source | Admitting: Family Medicine

## 2020-04-14 ENCOUNTER — Telehealth: Payer: Self-pay

## 2020-04-14 ENCOUNTER — Other Ambulatory Visit: Payer: Self-pay | Admitting: Family Medicine

## 2020-04-14 DIAGNOSIS — B379 Candidiasis, unspecified: Secondary | ICD-10-CM

## 2020-04-14 MED ORDER — FLUCONAZOLE 150 MG PO TABS: 150.0000 mg | ORAL_TABLET | Freq: Once | ORAL | 0 refills | Status: DC

## 2020-04-14 MED FILL — FLUCONAZOLE 150 MG TABS: 150 | 1 days supply | Qty: 1 | Fill #0

## 2020-04-14 NOTE — Telephone Encounter (Signed)
Medication for yeast infection sent to pharmacy.

## 2020-04-14 NOTE — Telephone Encounter (Signed)
Pt went to [facility] in [place] on 04/09/20 with prod cough with clear phlegm; now phlegm is yellowish green. No fever. Pt is on day 6 of cold symptoms; some SOB when talking or upon exertion. Last night when sleeping pt woke with gasping for air cause pt could not catch breath due to not being able to breathe thru nose.  No CP. Using albuterol inhaler that is not helping. Pt finished z pak on 04/13/20. Pt has runny nose and lt earache and when takes deep breath pt can hear rattling in chest. Pt request med for yeast infection after taking abx. [facility] Pontes outpt pharmacy.  Pt said she had 2 covid test from [facility] done and both were neg. Pt is at work at this time.

## 2020-04-14 NOTE — Telephone Encounter (Signed)
Pt notified of Rx sent in and instructed to make appt on Thursday to address cough and SOB. Pt made appt on 3/28.

## 2020-04-15 ENCOUNTER — Encounter: Payer: Self-pay | Admitting: Family Medicine

## 2020-04-15 ENCOUNTER — Ambulatory Visit (INDEPENDENT_AMBULATORY_CARE_PROVIDER_SITE_OTHER): Payer: No Typology Code available for payment source | Admitting: Family Medicine

## 2020-04-15 ENCOUNTER — Other Ambulatory Visit: Payer: Self-pay | Admitting: Family Medicine

## 2020-04-15 VITALS — BP 122/80 | HR 87 | Temp 97.2°F | Ht 61.5 in | Wt 193.0 lb

## 2020-04-15 DIAGNOSIS — J069 Acute upper respiratory infection, unspecified: Secondary | ICD-10-CM

## 2020-04-15 DIAGNOSIS — R300 Dysuria: Secondary | ICD-10-CM

## 2020-04-15 DIAGNOSIS — G4733 Obstructive sleep apnea (adult) (pediatric): Secondary | ICD-10-CM | POA: Diagnosis not present

## 2020-04-15 LAB — POC URINALSYSI DIPSTICK (AUTOMATED)
Bilirubin, UA: NEGATIVE
Blood, UA: POSITIVE
Glucose, UA: NEGATIVE
Ketones, UA: NEGATIVE
Leukocytes, UA: NEGATIVE
Nitrite, UA: NEGATIVE
Protein, UA: NEGATIVE
Spec Grav, UA: 1.015 (ref 1.010–1.025)
Urobilinogen, UA: 0.2 E.U./dL
pH, UA: 7.5 (ref 5.0–8.0)

## 2020-04-15 MED ORDER — HYDROCOD POLST-CPM POLST ER 10-8 MG/5ML PO SUER: 5.0000 mL | Freq: Every evening | ORAL | 0 refills | Status: DC | PRN

## 2020-04-15 MED ORDER — BENZONATATE 100 MG PO CAPS: 100.0000 mg | ORAL_CAPSULE | Freq: Three times a day (TID) | ORAL | 0 refills | Status: DC | PRN

## 2020-04-15 MED FILL — HYDROCODONE-CHLORPHEN ER SU: 10-8 | 28 days supply | Qty: 140 | Fill #0

## 2020-04-15 MED FILL — BENZONATATE 100 MG CAPS: 100 | 7 days supply | Qty: 20 | Fill #0

## 2020-04-15 NOTE — Addendum Note (Signed)
Addended by: Lesleigh Noe on: 04/15/2020 10:16 AM   Modules accepted: Orders

## 2020-04-15 NOTE — Addendum Note (Signed)
Addended by: Loreen Freud on: 04/15/2020 10:06 AM   Modules accepted: Orders

## 2020-04-15 NOTE — Assessment & Plan Note (Signed)
Not using CPAP due to loss of insurance. Would like a new referral. Suspect this may be contributing to nighttime cold symptoms

## 2020-04-15 NOTE — Patient Instructions (Addendum)
Urine without signs of infection. Hopefully the diflucan will help with the symptoms.    Drink plenty of fluids Get lots of rest  Sinus Congestion 1) Neti Pot (Saline rinse) -- 2 times day -- if tolerated 2) Flonase (Store Brand ok) - once daily 3) Over the counter congestion medications  Cough 1) Cough drops can be helpful 2) Nyquil (or nighttime cough medication) 3) Honey is proven to be one of the best cough medications  4) Cough medicine with Dextromethorphan can also be helpful  Sore Throat 1) Honey as above, cough drops 2) Ibuprofen or Aleve can be helpful 3) Salt water Gargles

## 2020-04-15 NOTE — Progress Notes (Signed)
Subjective:     Kayla Sexton is a 40 y.o. female presenting for Cough (X 1 week ), Shortness of Breath (Upon exertion ), and Nasal Congestion     Cough The cough is productive of purulent sputum. Associated symptoms include ear pain, postnasal drip, rhinorrhea and shortness of breath. Pertinent negatives include no chest pain, chills, fever, headaches or nasal congestion. There is no history of asthma.  Shortness of Breath This is a new problem. Associated symptoms include ear pain and rhinorrhea. Pertinent negatives include no chest pain, fever or headaches. There is no history of asthma.  Sinus Problem This is a new problem. Associated symptoms include coughing, ear pain and shortness of breath. Pertinent negatives include no chills or headaches.   Sleep apnea - but does not have a CPAP  Dysuria - shards of glass and will have itchy symptoms after peeing  Review of Systems  Constitutional: Negative for chills and fever.  HENT: Positive for ear pain, postnasal drip and rhinorrhea.   Respiratory: Positive for cough and shortness of breath.   Cardiovascular: Negative for chest pain.  Neurological: Negative for headaches.     Social History   Tobacco Use  Smoking Status Never Smoker  Smokeless Tobacco Never Used        Objective:    BP Readings from Last 3 Encounters:  04/15/20 122/80  03/13/20 108/60  03/04/20 110/80   Wt Readings from Last 3 Encounters:  04/15/20 193 lb (87.5 kg)  03/13/20 189 lb (85.7 kg)  03/04/20 189 lb (85.7 kg)    BP 122/80   Pulse 87   Temp (!) 97.2 F (36.2 C) (Temporal)   Ht 5' 1.5" (1.562 m)   Wt 193 lb (87.5 kg)   SpO2 99%   BMI 35.88 kg/m    Physical Exam Constitutional:      General: She is not in acute distress.    Appearance: She is well-developed. She is not diaphoretic.  HENT:     Head: Normocephalic and atraumatic.     Right Ear: Tympanic membrane and ear canal normal.     Left Ear: Tympanic membrane and ear  canal normal.     Nose: Mucosal edema and rhinorrhea present.     Right Sinus: No maxillary sinus tenderness or frontal sinus tenderness.     Left Sinus: No maxillary sinus tenderness or frontal sinus tenderness.     Mouth/Throat:     Pharynx: Uvula midline. Posterior oropharyngeal erythema present. No oropharyngeal exudate.     Tonsils: 0 on the right. 0 on the left.  Eyes:     General: No scleral icterus.    Conjunctiva/sclera: Conjunctivae normal.  Cardiovascular:     Rate and Rhythm: Normal rate and regular rhythm.     Heart sounds: Normal heart sounds. No murmur heard.   Pulmonary:     Effort: Pulmonary effort is normal. No respiratory distress.     Breath sounds: Normal breath sounds. No wheezing or rhonchi.  Musculoskeletal:     Cervical back: Neck supple.  Lymphadenopathy:     Cervical: No cervical adenopathy.  Skin:    General: Skin is warm and dry.     Capillary Refill: Capillary refill takes less than 2 seconds.  Neurological:     Mental Status: She is alert.           Assessment & Plan:   Problem List Items Addressed This Visit      Respiratory   OSA (obstructive sleep apnea)  Not using CPAP due to loss of insurance. Would like a new referral. Suspect this may be contributing to nighttime cold symptoms      Relevant Orders   Ambulatory referral to Sleep Studies    Other Visit Diagnoses    Viral URI with cough    -  Primary   Relevant Medications   benzonatate (TESSALON PERLES) 100 MG capsule   chlorpheniramine-HYDROcodone (TUSSIONEX PENNKINETIC ER) 10-8 MG/5ML SUER   Dysuria       Relevant Orders   POCT Urinalysis Dipstick (Automated) (Completed)     Discussed s/p antibiotics. No need for additional at this time. Symptomatic care  UA w/o infection. Suspect urinary symptoms 2/2 to yeast infection from abx. Diflucan previously called in.   Return if symptoms worsen or fail to improve.  Lesleigh Noe, MD  This visit occurred during the  SARS-CoV-2 public health emergency.  Safety protocols were in place, including screening questions prior to the visit, additional usage of staff PPE, and extensive cleaning of exam room while observing appropriate contact time as indicated for disinfecting solutions.

## 2020-04-17 ENCOUNTER — Encounter: Payer: Self-pay | Admitting: Family Medicine

## 2020-04-21 ENCOUNTER — Encounter: Payer: Self-pay | Admitting: Family Medicine

## 2020-04-21 ENCOUNTER — Ambulatory Visit: Payer: No Typology Code available for payment source | Admitting: Family Medicine

## 2020-04-21 DIAGNOSIS — H66002 Acute suppurative otitis media without spontaneous rupture of ear drum, left ear: Secondary | ICD-10-CM

## 2020-04-21 MED ORDER — FLUCONAZOLE 150 MG PO TABS: 150.0000 mg | ORAL_TABLET | Freq: Once | ORAL | 0 refills | Status: AC

## 2020-04-21 MED ORDER — AMOXICILLIN-POT CLAVULANATE 875-125 MG PO TABS: 1.0000 | ORAL_TABLET | Freq: Two times a day (BID) | ORAL | 0 refills | Status: DC

## 2020-04-22 MED ORDER — ALBUTEROL SULFATE HFA 108 (90 BASE) MCG/ACT IN AERS: 2.0000 | INHALATION_SPRAY | Freq: Four times a day (QID) | RESPIRATORY_TRACT | 1 refills | Status: DC | PRN

## 2020-04-22 NOTE — Telephone Encounter (Signed)
Pt notes worsening cough, with wheezing worse at night time. She endorses h/o RAD with viral URIs and has done well with albuterol inhaler in the past. Requests refill of albuterol - this was sent in. PCP is out of office for next day or two.

## 2020-04-22 NOTE — Addendum Note (Signed)
Addended by: Ria Bush on: 04/22/2020 06:03 PM   Modules accepted: Orders

## 2020-04-26 ENCOUNTER — Other Ambulatory Visit (HOSPITAL_COMMUNITY): Payer: Self-pay

## 2020-05-02 ENCOUNTER — Encounter: Payer: Self-pay | Admitting: Family Medicine

## 2020-05-13 ENCOUNTER — Ambulatory Visit (INDEPENDENT_AMBULATORY_CARE_PROVIDER_SITE_OTHER): Payer: No Typology Code available for payment source | Admitting: Pulmonary Disease

## 2020-05-13 ENCOUNTER — Other Ambulatory Visit: Payer: Self-pay

## 2020-05-13 ENCOUNTER — Encounter: Payer: Self-pay | Admitting: Family Medicine

## 2020-05-13 ENCOUNTER — Ambulatory Visit (INDEPENDENT_AMBULATORY_CARE_PROVIDER_SITE_OTHER): Payer: No Typology Code available for payment source | Admitting: Family Medicine

## 2020-05-13 ENCOUNTER — Encounter: Payer: Self-pay | Admitting: Pulmonary Disease

## 2020-05-13 VITALS — BP 118/68 | HR 95 | Temp 98.1°F | Ht 61.5 in | Wt 193.4 lb

## 2020-05-13 VITALS — BP 110/70 | HR 91 | Temp 98.0°F | Ht 62.0 in | Wt 193.0 lb

## 2020-05-13 DIAGNOSIS — H66005 Acute suppurative otitis media without spontaneous rupture of ear drum, recurrent, left ear: Secondary | ICD-10-CM | POA: Diagnosis not present

## 2020-05-13 DIAGNOSIS — E669 Obesity, unspecified: Secondary | ICD-10-CM

## 2020-05-13 DIAGNOSIS — Z0000 Encounter for general adult medical examination without abnormal findings: Secondary | ICD-10-CM

## 2020-05-13 DIAGNOSIS — Z0001 Encounter for general adult medical examination with abnormal findings: Secondary | ICD-10-CM | POA: Diagnosis not present

## 2020-05-13 DIAGNOSIS — G4733 Obstructive sleep apnea (adult) (pediatric): Secondary | ICD-10-CM

## 2020-05-13 MED ORDER — FLUCONAZOLE 150 MG PO TABS
150.0000 mg | ORAL_TABLET | Freq: Once | ORAL | 0 refills | Status: AC
Start: 2020-05-13 — End: 2020-05-13

## 2020-05-13 MED ORDER — CEFDINIR 300 MG PO CAPS: 600.0000 mg | ORAL_CAPSULE | Freq: Every day | ORAL | 0 refills | Status: AC

## 2020-05-13 NOTE — Progress Notes (Signed)
[facility]  Chief Complaint  Patient presents with  . Consult    Sleep consult for daytime fatigue    Constitutional:  BP 118/68 (BP Location: Left Arm)   Pulse 95   Temp 98.1 F (36.7 C) (Skin)   Ht 5' 1.5" (1.562 m)   Wt 193 lb 6.4 oz (87.7 kg)   SpO2 98%   BMI 35.95 kg/m   Past Medical History:  Cervical cancer, GERD, Hiatal hernia, Nephrolithiasis, Migraine headaches, Polycystic ovarian syndrome  Past Surgical History:  She  has a past surgical history that includes Colonoscopy (03/2008); endoscopy (2010); Combined hysteroscopy diagnostic / D&C (02-28-2012   dr grewal   @ [facility]); Cervical biopsy w/ loop electrode excision (12-04-2004   dr Helane Rima  [facility]); Foot surgery (Left, 2015); Cystoscopy with retrograde pyelogram, ureteroscopy and stent placement (Bilateral, 07/21/2017); Holmium laser application (Bilateral, 07/21/2017); Cystoscopy/ureteroscopy/holmium laser/stent placement (Left, 03/17/2018); Cystoscopy/ureteroscopy/holmium laser/stent placement (Right, 03/31/2018); Cystoscopy w/ retrogrades (Right, 03/31/2018); Finger surgery (Right); Cystoscopy/ureteroscopy/holmium laser/stent placement (Left, 01/04/2020); and Cystoscopy with retrograde urethrogram (Left, 01/04/2020).  Brief Summary:  Kayla Sexton is a 40 y.o. female with obstructive sleep apnea.      Subjective:   She had a sleep study in November 2019.  This showed severe sleep apnea.  She was tried on CPAP briefly.  She had trouble with pressure setting and rain out.  She lost her insurance and had to turn her CPAP in.  Her sleep has gotten worse.  She is restless at night, and gets cramps in her legs intermittently.  She is snoring and wakes up with a choking feeling.  She has trouble staying focused at work.  She goes to sleep at 9 pm.  She falls asleep quickly.  She wakes up some times to use the bathroom.  She gets out of bed at 630 am.  She feels tired in the morning.  She  sometimes gets morning headache.  She does not use anything to help her fall sleep or stay awake.  She denies sleep walking, sleep talking, bruxism, or nightmares.  There is no history of restless legs.  She denies sleep hallucinations, sleep paralysis, or cataplexy.  The Epworth score is 14 out of 24.    Physical Exam:   Appearance - well kempt   ENMT - no sinus tenderness, no oral exudate, no LAN, Mallampati 4 airway, no stridor  Respiratory - equal breath sounds bilaterally, no wheezing or rales  CV - s1s2 regular rate and rhythm, no murmurs  Ext - no clubbing, no edema  Skin - no rashes  Psych - normal mood and affect   Sleep Tests:   HST 12/12/17 >> AHI 39.8, SpO2 low 83%  Social History:  She  reports that she has never smoked. She has never used smokeless tobacco. She reports that she does not drink alcohol and does not use drugs.  Family History:  Her family history includes Healthy in her father and mother.    Discussion:  She has snoring, sleep disruption, apnea, and daytime sleepiness.  Her previous sleep study showed severe sleep apnea.  Her BMI is > 35.  I am concerned she still has significant sleep apnea.  Assessment/Plan:   Snoring with excessive daytime sleepiness. - will need to arrange for a home sleep study  Obesity. - discussed how weight can impact sleep and risk for sleep disordered breathing - discussed options to assist with weight loss: combination of diet modification, cardiovascular and  strength training exercises  Cardiovascular risk. - had an extensive discussion regarding the adverse health consequences related to untreated sleep disordered breathing - specifically discussed the risks for hypertension, coronary artery disease, cardiac dysrhythmias, cerebrovascular disease, and diabetes - lifestyle modification discussed  Safe driving practices. - discussed how sleep disruption can increase risk of accidents, particularly when  driving - safe driving practices were discussed  Therapies for obstructive sleep apnea. - if the sleep study shows significant sleep apnea, then various therapies for treatment were reviewed: CPAP, oral appliance, and surgical interventions  Time Spent Involved in Patient Care on Day of Examination:  32 minutes  Follow up:  Patient Instructions  Will arrange for home sleep study Will call to arrange for follow up after sleep study reviewed   Medication List:   Allergies as of 05/13/2020      Reactions   Adhesive [tape] Swelling   Per pt only the 12 lead ekg pads cause "welps and abrasions"-ALSO SOME BANDAIDS CAUSE IRRITATION-OK TO USE PAPER TAPE   Other    RED MEAT-ANAPHYLAXIS   Vancomycin Hives, Itching   Bactrim [sulfamethoxazole-trimethoprim] Rash      Medication List       Accurate as of May 13, 2020  9:28 AM. If you have any questions, ask your nurse or doctor.        STOP taking these medications   D3-50 1.25 MG (50000 UT) capsule Generic drug: Cholecalciferol Stopped by: Chesley Mires, MD   Potassium Citrate 15 MEQ (1620 MG) Tbcr Stopped by: Chesley Mires, MD   sertraline 50 MG tablet Commonly known as: ZOLOFT Stopped by: Chesley Mires, MD   silver sulfADIAZINE 1 % cream Commonly known as: SILVADENE Stopped by: Chesley Mires, MD   sulfamethoxazole-trimethoprim 800-160 MG tablet Commonly known as: BACTRIM DS Stopped by: Chesley Mires, MD   Vitamin D3 1.25 MG (50000 UT) Tabs Stopped by: Chesley Mires, MD     TAKE these medications   albuterol 108 (90 Base) MCG/ACT inhaler Commonly known as: VENTOLIN HFA Inhale 2 puffs into the lungs every 6 (six) hours as needed for wheezing or shortness of breath.   amoxicillin-clavulanate 875-125 MG tablet Commonly known as: AUGMENTIN Take 1 tablet by mouth 2 (two) times daily.   aspirin-acetaminophen-caffeine 250-250-65 MG tablet Commonly known as: EXCEDRIN MIGRAINE Take 1 tablet by mouth every 6 (six) hours as needed  for headache.   benzonatate 100 MG capsule Commonly known as: TESSALON TAKE 1 CAPSULE (100 MG TOTAL) BY MOUTH 3 (THREE) TIMES DAILY AS NEEDED FOR COUGH.   chlorpheniramine-HYDROcodone 10-8 MG/5ML Suer Commonly known as: TUSSIONEX TAKE 5 MLS BY MOUTH AT BEDTIME AS NEEDED FOR COUGH.   cyclobenzaprine 10 MG tablet Commonly known as: FLEXERIL Take 10 mg by mouth daily as needed. What changed: Another medication with the same name was removed. Continue taking this medication, and follow the directions you see here. Changed by: Chesley Mires, MD   Fluarix Quadrivalent 0.5 ML injection Generic drug: influenza vac split quadrivalent PF TO BE ADMINISTERED BY PHARMACIST   ibuprofen 200 MG tablet Commonly known as: ADVIL Take 400 mg by mouth every 6 (six) hours as needed for mild pain or moderate pain.   Pfizer-BioNTech COVID-19 Vacc 30 MCG/0.3ML injection Generic drug: COVID-19 mRNA vaccine Therapist, music) USE AS DIRECTED       Signature:  Chesley Mires, MD [place] Pager - [phone] 05/13/2020, 9:28 AM

## 2020-05-13 NOTE — Patient Instructions (Signed)
Here is what I would recommend:  1) Increase the amount of water you drink a day > specifically drink a glass of water 8 oz or more before every meal 2) Could start a fiber supplement (like metamucil) > You could take this up to 3 times a day, but I would start with 1 time a day until you get used to it. It may cause some stomach upset 3) Make sure you sit down to eat and eat slowly (cut meat one piece at a time) > specifically train your body to eat only at the table (avoiding snacking in front of the TV) 4) Fill up on healthy items first > consider eating a salad with low calorie salad dressing before every meal  5) Make 1/2 of your plate vegetables 6) Keep healthy snacks available for those times when you are bored 7) Consider using a calorie counting app like MyFitnessPal > counting calories helps you make wise choices around snacking. Or if you can afford it you could sign up for Weight Watchers -- and learn about healthy options through a point system     Semaglutide injection solution What is this medicine? SEMAGLUTIDE (Sem a GLOO tide) is used to improve blood sugar control in adults with type 2 diabetes. This medicine may be used with other diabetes medicines. This drug may also reduce the risk of heart attack or stroke if you have type 2 diabetes and risk factors for heart disease. This medicine may be used for other purposes; ask your health care provider or pharmacist if you have questions. COMMON BRAND NAME(S): OZEMPIC What should I tell my health care provider before I take this medicine? They need to know if you have any of these conditions:  endocrine tumors (MEN 2) or if someone in your family had these tumors  eye disease, vision problems  history of pancreatitis  kidney disease  stomach problems  thyroid cancer or if someone in your family had thyroid cancer  an unusual or allergic reaction to semaglutide, other medicines, foods, dyes, or preservatives  pregnant or  trying to get pregnant  breast-feeding How should I use this medicine? This medicine is for injection under the skin of your upper leg (thigh), stomach area, or upper arm. It is given once every week (every 7 days). You will be taught how to prepare and give this medicine. Use exactly as directed. Take your medicine at regular intervals. Do not take it more often than directed. If you use this medicine with insulin, you should inject this medicine and the insulin separately. Do not mix them together. Do not give the injections right next to each other. Change (rotate) injection sites with each injection. It is important that you put your used needles and syringes in a special sharps container. Do not put them in a trash can. If you do not have a sharps container, call your pharmacist or healthcare provider to get one. A special MedGuide will be given to you by the pharmacist with each prescription and refill. Be sure to read this information carefully each time. This drug comes with INSTRUCTIONS FOR USE. Ask your pharmacist for directions on how to use this drug. Read the information carefully. Talk to your pharmacist or health care provider if you have questions. Talk to your pediatrician regarding the use of this medicine in children. Special care may be needed. Overdosage: If you think you have taken too much of this medicine contact a poison control center or emergency room  at once. NOTE: This medicine is only for you. Do not share this medicine with others. What if I miss a dose? If you miss a dose, take it as soon as you can within 5 days after the missed dose. Then take your next dose at your regular weekly time. If it has been longer than 5 days after the missed dose, do not take the missed dose. Take the next dose at your regular time. Do not take double or extra doses. If you have questions about a missed dose, contact your health care provider for advice. What may interact with this  medicine?  other medicines for diabetes Many medications may cause changes in blood sugar, these include:  alcohol containing beverages  antiviral medicines for HIV or AIDS  aspirin and aspirin-like drugs  certain medicines for blood pressure, heart disease, irregular heart beat  chromium  diuretics  female hormones, such as estrogens or progestins, birth control pills  fenofibrate  gemfibrozil  isoniazid  lanreotide  female hormones or anabolic steroids  MAOIs like Carbex, Eldepryl, Marplan, Nardil, and Parnate  medicines for weight loss  medicines for allergies, asthma, cold, or cough  medicines for depression, anxiety, or psychotic disturbances  niacin  nicotine  NSAIDs, medicines for pain and inflammation, like ibuprofen or naproxen  octreotide  pasireotide  pentamidine  phenytoin  probenecid  quinolone antibiotics such as ciprofloxacin, levofloxacin, ofloxacin  some herbal dietary supplements  steroid medicines such as prednisone or cortisone  sulfamethoxazole; trimethoprim  thyroid hormones Some medications can hide the warning symptoms of low blood sugar (hypoglycemia). You may need to monitor your blood sugar more closely if you are taking one of these medications. These include:  beta-blockers, often used for high blood pressure or heart problems (examples include atenolol, metoprolol, propranolol)  clonidine  guanethidine  reserpine This list may not describe all possible interactions. Give your health care provider a list of all the medicines, herbs, non-prescription drugs, or dietary supplements you use. Also tell them if you smoke, drink alcohol, or use illegal drugs. Some items may interact with your medicine. What should I watch for while using this medicine? Visit your doctor or health care professional for regular checks on your progress. Drink plenty of fluids while taking this medicine. Check with your doctor or health care  professional if you get an attack of severe diarrhea, nausea, and vomiting. The loss of too much body fluid can make it dangerous for you to take this medicine. A test called the HbA1C (A1C) will be monitored. This is a simple blood test. It measures your blood sugar control over the last 2 to 3 months. You will receive this test every 3 to 6 months. Learn how to check your blood sugar. Learn the symptoms of low and high blood sugar and how to manage them. Always carry a quick-source of sugar with you in case you have symptoms of low blood sugar. Examples include hard sugar candy or glucose tablets. Make sure others know that you can choke if you eat or drink when you develop serious symptoms of low blood sugar, such as seizures or unconsciousness. They must get medical help at once. Tell your doctor or health care professional if you have high blood sugar. You might need to change the dose of your medicine. If you are sick or exercising more than usual, you might need to change the dose of your medicine. Do not skip meals. Ask your doctor or health care professional if you should  avoid alcohol. Many nonprescription cough and cold products contain sugar or alcohol. These can affect blood sugar. Pens should never be shared. Even if the needle is changed, sharing may result in passing of viruses like hepatitis or HIV. Wear a medical ID bracelet or chain, and carry a card that describes your disease and details of your medicine and dosage times. Do not become pregnant while taking this medicine. Women should inform their doctor if they wish to become pregnant or think they might be pregnant. There is a potential for serious side effects to an unborn child. Talk to your health care professional or pharmacist for more information. What side effects may I notice from receiving this medicine? Side effects that you should report to your doctor or health care professional as soon as possible:  allergic reactions  like skin rash, itching or hives, swelling of the face, lips, or tongue  breathing problems  changes in vision  diarrhea that continues or is severe  lump or swelling on the neck  severe nausea  signs and symptoms of infection like fever or chills; cough; sore throat; pain or trouble passing urine  signs and symptoms of low blood sugar such as feeling anxious, confusion, dizziness, increased hunger, unusually weak or tired, sweating, shakiness, cold, irritable, headache, blurred vision, fast heartbeat, loss of consciousness  signs and symptoms of kidney injury like trouble passing urine or change in the amount of urine  trouble swallowing  unusual stomach upset or pain  vomiting Side effects that usually do not require medical attention (report to your doctor or health care professional if they continue or are bothersome):  constipation  diarrhea  nausea  pain, redness, or irritation at site where injected  stomach upset This list may not describe all possible side effects. Call your doctor for medical advice about side effects. You may report side effects to FDA at 1-800-FDA-1088. Where should I keep my medicine? Keep out of the reach of children. Store unopened pens in a refrigerator between 2 and 8 degrees C (36 and 46 degrees F). Do not freeze. Protect from light and heat. After you first use the pen, it can be stored for 56 days at room temperature between 15 and 30 degrees C (59 and 86 degrees F) or in a refrigerator. Throw away your used pen after 56 days or after the expiration date, whichever comes first. Do not store your pen with the needle attached. If the needle is left on, medicine may leak from the pen. NOTE: This sheet is a summary. It may not cover all possible information. If you have questions about this medicine, talk to your doctor, pharmacist, or health care provider.  2021 Elsevier/Gold Standard (2018-09-26 09:41:51)

## 2020-05-13 NOTE — Progress Notes (Signed)
Annual Exam   Chief Complaint:  Chief Complaint  Patient presents with  . Annual Exam    Will be making an appt with OB/GYN soon    History of Present Illness:  [name] is a 40 y.o. No obstetric history on file. who LMP was No LMP recorded. (Menstrual status: Other)., presents today for her annual examination.    Hearing difficulty - still cannot hear - muffled sound - static sound  Pulm - getting sleep study repeated  Psychology  - saw someone and should be sending records - counseling too  Nutrition/Lifestyle Diet: not great - some days doesn't eat due to lack of appetite Exercise: not currently She does not know get adequate calcium and Vitamin D in her diet.  Social History   Tobacco Use  Smoking Status Never Smoker  Smokeless Tobacco Never Used   Social History   Substance and Sexual Activity  Alcohol Use No   Social History   Substance and Sexual Activity  Drug Use No     Safety The patient wears seatbelts: yes.     The patient feels safe at home and in their relationships: yes.  General Health Dentist in the last year: Yes Eye doctor: not applicable  Menstrual No periods - PCOS, never regular Did have bleeding episode last month    GYN She is not sexually active.     Cervical Cancer Screening (Age 79-65) Last Pap:  July 2016 Results were: ASCUS with NEGATIVE high risk HPV   Family History of Breast Cancer: yes, pgm Family History of Ovarian Cancer: no    Weight Wt Readings from Last 3 Encounters:  05/13/20 193 lb (87.5 kg)  05/13/20 193 lb 6.4 oz (87.7 kg)  04/15/20 193 lb (87.5 kg)   Patient has high BMI  BMI Readings from Last 1 Encounters:  05/13/20 35.30 kg/m     Chronic disease screening Blood pressure monitoring:  BP Readings from Last 3 Encounters:  05/13/20 110/70  05/13/20 118/68  04/15/20 122/80     Lipid Monitoring: Indication for screening: age >71, obesity, diabetes, family hx, CV risk  factors.  Lipid screening: Not Indicated  Lab Results  Component Value Date   CHOL 210 (H) 12/07/2019   HDL 41.60 12/07/2019   LDLCALC 147 (H) 12/07/2019   TRIG 106.0 12/07/2019   CHOLHDL 5 12/07/2019     Diabetes Screening: age >68, overweight, family hx, PCOS, hx of gestational diabetes, at risk ethnicity, elevated blood pressure >135/80.  Diabetes Screening screening: Not Indicated  Lab Results  Component Value Date   HGBA1C 5.3 03/13/2020      Past Medical History:  Diagnosis Date  . Anemia   . Cancer ([facility]) 2006   CERVICAL   . CIN III (cervical intraepithelial neoplasia III)   . Dysuria   . Family history of adverse reaction to anesthesia    DAD AND BROTHER AND MOM-N/V  . GERD (gastroesophageal reflux disease)    OCC  . Hematuria   . HH (hiatus hernia)   . History of cervical dysplasia 2006   CIN1 and CIN2  . History of kidney stones   . Migraines   . PCOS (polycystic ovarian syndrome)    Dr. Helane Rima  . PONV (postoperative nausea and vomiting)    HARD TO WAKE UP  . Right ureteral calculus   . Sleep apnea    DOES NOT USE CPAP    Past Surgical History:  Procedure Laterality Date  . CERVICAL BIOPSY  W/ LOOP ELECTRODE EXCISION  12-04-2004   dr Helane Rima  [facility]  . COLONOSCOPY  03/2008   Dr. Michail Sermon  . COMBINED HYSTEROSCOPY DIAGNOSTIC / D&C  02-28-2012   dr Helane Rima   @ [facility]  . CYSTOSCOPY W/ RETROGRADES Right 03/31/2018   Procedure: CYSTOSCOPY WITH RETROGRADE PYELOGRAM;  Surgeon: Billey Co, MD;  Location: [facility];  Service: Urology;  Laterality: Right;  . CYSTOSCOPY WITH RETROGRADE PYELOGRAM, URETEROSCOPY AND STENT PLACEMENT Bilateral 07/21/2017   Procedure: CYSTOSCOPY WITH BILATERAL  RETROGRADE BILATERAL URETEROSCOPY AND STENTS  PLACEMENT;  Surgeon: Irine Seal, MD;  Location[facility];  Service: Urology;  Laterality: Bilateral;  . CYSTOSCOPY WITH RETROGRADE URETHROGRAM Left 01/04/2020   Procedure: CYSTOSCOPY WITH RETROGRADE URETHROGRAM;   Surgeon: Billey Co, MD;  Location: [facility];  Service: Urology;  Laterality: Left;  . CYSTOSCOPY/URETEROSCOPY/HOLMIUM LASER/STENT PLACEMENT Left 03/17/2018   Procedure: CYSTOSCOPY/URETEROSCOPY/HOLMIUM LASER/STENT PLACEMENT;  Surgeon: Billey Co, MD;  Location: [facility];  Service: Urology;  Laterality: Left;  . CYSTOSCOPY/URETEROSCOPY/HOLMIUM LASER/STENT PLACEMENT Right 03/31/2018   Procedure: CYSTOSCOPY/URETEROSCOPY/HOLMIUM LASER/STENT PLACEMENT;  Surgeon: Billey Co, MD;  Location: [facility];  Service: Urology;  Laterality: Right;  . CYSTOSCOPY/URETEROSCOPY/HOLMIUM LASER/STENT PLACEMENT Left 01/04/2020   Procedure: CYSTOSCOPY/URETEROSCOPY/HOLMIUM LASER;  Surgeon: Billey Co, MD;  Location: [facility];  Service: Urology;  Laterality: Left;  . endoscopy  2010   Dr. Amedeo Plenty  . FINGER SURGERY Right    MIDDLE FINGER  . FOOT SURGERY Left 2015   5th bunionectomy w/ 2nd digit w/ fusion , retained hardware w/ 5th toe  . HOLMIUM LASER APPLICATION Bilateral 9/98/3382   Procedure: HOLMIUM LASER APPLICATION;  Surgeon: Irine Seal, MD;  Location: [facility];  Service: Urology;  Laterality: Bilateral;    Prior to Admission medications   Medication Sig Start Date End Date Taking? Authorizing Provider  albuterol (VENTOLIN HFA) 108 (90 Base) MCG/ACT inhaler Inhale 2 puffs into the lungs every 6 (six) hours as needed for wheezing or shortness of breath. 04/22/20  Yes Ria Bush, MD  aspirin-acetaminophen-caffeine (EXCEDRIN MIGRAINE) [phone] MG tablet Take 1 tablet by mouth every 6 (six) hours as needed for headache.   Yes [provider]  benzonatate (TESSALON) 100 MG capsule TAKE 1 CAPSULE (100 MG TOTAL) BY MOUTH 3 (THREE) TIMES DAILY AS NEEDED FOR COUGH. 04/15/20 04/15/21 Yes Lesleigh Noe, MD  chlorpheniramine-HYDROcodone (TUSSIONEX) 10-8 MG/5ML SUER TAKE 5 MLS BY MOUTH AT BEDTIME AS NEEDED FOR COUGH. 04/15/20 10/12/20 Yes Lesleigh Noe, MD  cyclobenzaprine  (FLEXERIL) 10 MG tablet Take 10 mg by mouth daily as needed. 08/14/19  Yes [provider]  ibuprofen (ADVIL,MOTRIN) 200 MG tablet Take 400 mg by mouth every 6 (six) hours as needed for mild pain or moderate pain.    Yes [provider]    Allergies  Allergen Reactions  . Adhesive [Tape] Swelling    Per pt only the 12 lead ekg pads cause "welps and abrasions"-ALSO SOME BANDAIDS CAUSE IRRITATION-OK TO USE PAPER TAPE  . Other     RED MEAT-ANAPHYLAXIS  . Vancomycin Hives and Itching  . Bactrim [Sulfamethoxazole-Trimethoprim] Rash    Gynecologic History: No LMP recorded. (Menstrual status: Other).  Obstetric History: No obstetric history on file.  Social History   Socioeconomic History  . Marital status: Single    Spouse name: Not on file  . Number of children: Not on file  . Years of education: Not on file  . Highest education level: Not on file  Occupational  History  . Not on file  Tobacco Use  . Smoking status: Never Smoker  . Smokeless tobacco: Never Used  Vaping Use  . Vaping Use: Never used  Substance and Sexual Activity  . Alcohol use: No  . Drug use: No  . Sexual activity: Yes    Birth control/protection: None  Other Topics Concern  . Not on file  Social History Narrative  . Not on file   Social Determinants of Health   Financial Resource Strain: Not on file  Food Insecurity: Not on file  Transportation Needs: Not on file  Physical Activity: Not on file  Stress: Not on file  Social Connections: Not on file  Intimate Partner Violence: Not on file    Family History  Problem Relation Age of Onset  . Healthy Mother   . Healthy Father   . Breast cancer Paternal Grandmother   . Breast cancer Other        Maternal Great Aunt    Review of Systems  Constitutional: Negative for chills and fever.  HENT: Negative for congestion and sore throat.   Eyes: Negative for blurred vision and double vision.  Respiratory: Negative for shortness of  breath.   Cardiovascular: Negative for chest pain.  Gastrointestinal: Negative for heartburn, nausea and vomiting.  Genitourinary: Negative.   Musculoskeletal: Negative.  Negative for myalgias.  Skin: Negative for rash.  Neurological: Negative for dizziness and headaches.  Endo/Heme/Allergies: Does not bruise/bleed easily.  Psychiatric/Behavioral: Negative for depression. The patient is not nervous/anxious.      Physical Exam BP 110/70   Pulse 91   Temp 98 F (36.7 C) (Temporal)   Ht 5\' 2"  (1.575 m)   Wt 193 lb (87.5 kg)   SpO2 97%   BMI 35.30 kg/m    BP Readings from Last 3 Encounters:  05/13/20 110/70  05/13/20 118/68  04/15/20 122/80    Wt Readings from Last 3 Encounters:  05/13/20 193 lb (87.5 kg)  05/13/20 193 lb 6.4 oz (87.7 kg)  04/15/20 193 lb (87.5 kg)     Physical Exam Constitutional:      General: She is not in acute distress.    Appearance: She is well-developed. She is not diaphoretic.  HENT:     Head: Normocephalic and atraumatic.     Right Ear: External ear normal.     Left Ear: External ear normal. A middle ear effusion is present. Tympanic membrane is bulging.     Nose: Nose normal.  Eyes:     General: No scleral icterus.    Conjunctiva/sclera: Conjunctivae normal.  Cardiovascular:     Rate and Rhythm: Normal rate and regular rhythm.     Heart sounds: No murmur heard.   Pulmonary:     Effort: Pulmonary effort is normal. No respiratory distress.     Breath sounds: Normal breath sounds. No wheezing.  Abdominal:     General: Bowel sounds are normal. There is no distension.     Palpations: Abdomen is soft. There is no mass.     Tenderness: There is no abdominal tenderness. There is no guarding or rebound.  Musculoskeletal:        General: Normal range of motion.     Cervical back: Neck supple.  Lymphadenopathy:     Cervical: No cervical adenopathy.  Skin:    General: Skin is warm and dry.     Capillary Refill: Capillary refill takes less  than 2 seconds.  Neurological:     Mental Status: She  is alert and oriented to person, place, and time.     Deep Tendon Reflexes: Reflexes normal.  Psychiatric:        Behavior: Behavior normal.       Results: Depression screen [facility] 2/9 12/07/2019 10/17/2019 11/21/2017  Decreased Interest 2 1 0  Down, Depressed, Hopeless 3 2 0  PHQ - 2 Score 5 3 0  Altered sleeping 0 0 -  Tired, decreased energy 2 3 -  Change in appetite 2 2 -  Feeling bad or failure about yourself  2 2 -  Trouble concentrating 2 2 -  Moving slowly or fidgety/restless 0 0 -  Suicidal thoughts 1 0 -  PHQ-9 Score 14 12 -  Difficult doing work/chores Very difficult Extremely dIfficult -      Assessment: 40 y.o. No obstetric history on file. female here for routine annual examination.  Plan: Problem List Items Addressed This Visit      Nervous and Auditory   Recurrent acute suppurative otitis media without spontaneous rupture of left tympanic membrane    Persistent left side. Will try cefdinir. If no improvement refer to ENT for evaluation      Relevant Medications   cefdinir (OMNICEF) 300 MG capsule   fluconazole (DIFLUCAN) 150 MG tablet     Other   Obesity (BMI 30-39.9)    Information for ozempic provided. She will check insurance. Encouraged diet/exercise       Other Visit Diagnoses    Annual physical exam    -  Primary       Screening: -- Blood pressure screen normal -- cholesterol screening: not due for screening -- Weight screening: obese: discussed management options, including lifestyle, dietary, and exercise. -- Diabetes Screening: not due for screening -- Nutrition: encouraged healthy diet   Psych -- Depression screening (PHQ-9):  [place] Office Visit from 12/07/2019 in [place] at [facility]  PHQ-9 Total Score 14       Safety -- tobacco screening: not using -- alcohol screening:  low-risk usage. -- no evidence of domestic violence or intimate partner  violence.   Cancer Screening -- pap smear: pt aware she is overdue and will contact her OB/GYN -- family history of breast cancer screening: distant relatives not at increased risk   Immunizations Immunization History  Administered Date(s) Administered  . DTaP 04/30/2019  . HPV Quadrivalent 12/24/2004, 02/23/2005, 06/02/2005  . Influenza,inj,Quad PF,6+ Mos 10/26/2012, 11/05/2019  . Influenza-Unspecified 12/06/2017  . PFIZER(Purple Top)SARS-COV-2 Vaccination 03/02/2019, 06/15/2019    -- flu vaccine up to date -- TDAP q10 years up to date -- Covid-19 Vaccine up to date  Encouraged regular vision and dental screening. Encouraged healthy exercise and diet.   Lesleigh Noe

## 2020-05-13 NOTE — Patient Instructions (Signed)
Will arrange for home sleep study Will call to arrange for follow up after sleep study reviewed  

## 2020-05-13 NOTE — Assessment & Plan Note (Signed)
Information for ozempic provided. She will check insurance. Encouraged diet/exercise

## 2020-05-13 NOTE — Assessment & Plan Note (Signed)
Persistent left side. Will try cefdinir. If no improvement refer to ENT for evaluation

## 2020-06-02 ENCOUNTER — Ambulatory Visit: Payer: No Typology Code available for payment source

## 2020-06-02 ENCOUNTER — Other Ambulatory Visit: Payer: Self-pay

## 2020-06-02 DIAGNOSIS — G4733 Obstructive sleep apnea (adult) (pediatric): Secondary | ICD-10-CM

## 2020-06-03 ENCOUNTER — Telehealth: Payer: Self-pay | Admitting: Pulmonary Disease

## 2020-06-03 DIAGNOSIS — G4733 Obstructive sleep apnea (adult) (pediatric): Secondary | ICD-10-CM | POA: Diagnosis not present

## 2020-06-03 NOTE — Telephone Encounter (Signed)
Called and went over HST results per Dr Halford Chessman with patient. All questions answered and patient expressed full understanding. Scheduled office visit with NP for Monday 06/09/20 at 4:30pm at the [facility] office. Patient agreeable with time, date and location. Nothing further needed at this time.

## 2020-06-03 NOTE — Telephone Encounter (Signed)
HST 06/02/20 >> AHI 33.3, SpO2 low 84%.   Please inform her that her sleep study shows severe obstructive sleep apnea.  Please arrange for ROV with me or NP to discuss treatment options.

## 2020-06-09 ENCOUNTER — Telehealth: Payer: Self-pay

## 2020-06-09 ENCOUNTER — Encounter: Payer: Self-pay | Admitting: Primary Care

## 2020-06-09 ENCOUNTER — Ambulatory Visit (INDEPENDENT_AMBULATORY_CARE_PROVIDER_SITE_OTHER): Payer: No Typology Code available for payment source | Admitting: Primary Care

## 2020-06-09 ENCOUNTER — Other Ambulatory Visit: Payer: Self-pay

## 2020-06-09 DIAGNOSIS — G4733 Obstructive sleep apnea (adult) (pediatric): Secondary | ICD-10-CM

## 2020-06-09 NOTE — Progress Notes (Signed)
Virtual Visit via Telephone Note  I connected with Kayla Sexton on 06/09/20 at  4:30 PM EDT by telephone and verified that I am speaking with the correct person using two identifiers.  Location: Patient: Home Provider: Office    I discussed the limitations, risks, security and privacy concerns of performing an evaluation and management service by telephone and the availability of in person appointments. I also discussed with the patient that there may be a patient responsible charge related to this service. The patient expressed understanding and agreed to proceed.   History of Present Illness: 40 year old female, never smoked.  Past medical history significant for OSA.  Patient of Dr. Halford Chessman, seen for initial consult on 05/13/2020.  Previous [facility] pulmonary encounters: 05/13/20- Dr. Halford Chessman, sleep consult  She had a sleep study in November 2019.  This showed severe sleep apnea.  She was tried on CPAP briefly.  She had trouble with pressure setting and rain out.  She lost her insurance and had to turn her CPAP in.  Her sleep has gotten worse.  She is restless at night, and gets cramps in her legs intermittently.  She is snoring and wakes up with a choking feeling.  She has trouble staying focused at work.  She goes to sleep at 9 pm.  She falls asleep quickly.  She wakes up some times to use the bathroom.  She gets out of bed at 630 am.  She feels tired in the morning.  She sometimes gets morning headache.  She does not use anything to help her fall sleep or stay awake.  She denies sleep walking, sleep talking, bruxism, or nightmares.  There is no history of restless legs.  She denies sleep hallucinations, sleep paralysis, or cataplexy.  The Epworth score is 14 out of 24.   06/09/2020- Interim hx  Patient presents today to go over recent home sleep study results.  HST on 06/02/2020 showed severe obstructive sleep apnea, AHI 33.3 with SPO2 low 84%. She is so exhausted, states that she feels as  though she ran a marathon every day. She can barely keep her eyes open. Treatment options include weight loss, side-sitting position, oral appliance, CPAP therapy or referral to ENT for possible surgical options. She previously had a bad experience with CPAP d/t a mishap with the humidification.  She is willing to try cpap therapy again, if she does not tolerate would consider Inspire device.    Observations/Objective:  - Able to speak in full sentences; no overt shortness of breath, wheezing or cough  Assessment and Plan:  Severe OSA: - HST 06/02/20>> AHI 33.3 with SPO2 low 84% - Patient reports significant daytime fatigue  - Reviewed treatment options; she is open to trying CPAP again, if poorly tolerates consider referral to ENT for Inspire device  - DME order auto CPAP 5-15cm h20, mask of choice (priority/luna machine ok) - Aim to wear CPAP every night for min 4-6 hours or longer  - Encourage weight loss efforts and advised not to drive if experiencing excessive daytime sleepiness   Follow Up Instructions:   - 3 months with Dr. Halford Chessman or APP   I discussed the assessment and treatment plan with the patient. The patient was provided an opportunity to ask questions and all were answered. The patient agreed with the plan and demonstrated an understanding of the instructions.   The patient was advised to call back or seek an in-person evaluation if the symptoms worsen or if the condition fails to  improve as anticipated.  I provided 22 minutes of non-face-to-face time during this encounter.   Martyn Ehrich, NP

## 2020-06-09 NOTE — Patient Instructions (Addendum)
Severe OSA: - HST 06/02/20>> AHI 33.3 with SPO2 low 84% - DME order auto CPAP 5-15cm h20, mask of choice (priority/luna machine ok) - Aim to wear CPAP every night for minimum 4-6 hours or longer  - Encourage weight loss efforts, advised not to drive if experiencing excessive daytime sleepiness   Follow-up: - 3 months    CPAP and BPAP Information CPAP and BPAP are methods that use air pressure to keep your airways open and to help you breathe well. CPAP and BPAP use different amounts of pressure. Your health care provider will tell you whether CPAP or BPAP would be more helpful for you.  CPAP stands for "continuous positive airway pressure." With CPAP, the amount of pressure stays the same while you breathe in and out.  BPAP stands for "bi-level positive airway pressure." With BPAP, the amount of pressure will be higher when you breathe in (inhale) and lower when you breathe out(exhale). This allows you to take larger breaths. CPAP or BPAP may be used in the hospital, or your health care provider may want you to use it at home. You may need to have a sleep study before your health care provider can order a machine for you to use at home. Why are CPAP and BPAP treatments used? CPAP or BPAP can be helpful if you have:  Sleep apnea.  Chronic obstructive pulmonary disease (COPD).  Heart failure.  Medical conditions that cause muscle weakness, including muscular dystrophy or amyotrophic lateral sclerosis (ALS).  Other problems that cause breathing to be shallow, weak, abnormal, or difficult. CPAP and BPAP are most commonly used for obstructive sleep apnea (OSA) to keep the airways from collapsing when the muscles relax during sleep. How is CPAP or BPAP administered? Both CPAP and BPAP are provided by a small machine with a flexible plastic tube that attaches to a plastic mask that you wear. Air is blown through the mask into your nose or mouth. The amount of pressure that is used to blow the  air can be adjusted on the machine. Your health care provider will set the pressure setting and help you find the best mask for you. When should CPAP or BPAP be used? In most cases, the mask only needs to be worn during sleep. Generally, the mask needs to be worn throughout the night and during any daytime naps. People with certain medical conditions may also need to wear the mask at other times when they are awake. Follow instructions from your health care provider about when to use the machine. What are some tips for using the mask?  Because the mask needs to be snug, some people feel trapped or closed-in (claustrophobic) when first using the mask. If you feel this way, you may need to get used to the mask. One way to do this is to hold the mask loosely over your nose or mouth and then gradually apply the mask more snugly. You can also gradually increase the amount of time that you use the mask.  Masks are available in various types and sizes. If your mask does not fit well, talk with your health care provider about getting a different one. Some common types of masks include: ? Full face masks, which fit over the mouth and nose. ? Nasal masks, which fit over the nose. ? Nasal pillow or prong masks, which fit into the nostrils.  If you are using a mask that fits over your nose and you tend to breathe through your mouth, a  chin strap may be applied to help keep your mouth closed.  Some CPAP and BPAP machines have alarms that may sound if the mask comes off or develops a leak.  If you have trouble with the mask, it is very important that you talk with your health care provider about finding a way to make the mask easier to tolerate. Do not stop using the mask. There could be a negative impact to your health if you stop using the mask.   What are some tips for using the machine?  Place your CPAP or BPAP machine on a secure table or stand near an electrical outlet.  Know where the on/off switch is  on the machine.  Follow instructions from your health care provider about how to set the pressure on your machine and when you should use it.  Do not eat or drink while the CPAP or BPAP machine is on. Food or fluids could get pushed into your lungs by the pressure of the CPAP or BPAP.  For home use, CPAP and BPAP machines can be rented or purchased through home health care companies. Many different brands of machines are available. Renting a machine before purchasing may help you find out which particular machine works well for you. Your insurance may also decide which machine you may get.  Keep the CPAP or BPAP machine and attachments clean. Ask your health care provider for specific instructions. Follow these instructions at home:  Do not use any products that contain nicotine or tobacco, such as cigarettes, e-cigarettes, and chewing tobacco. If you need help quitting, ask your health care provider.  Keep all follow-up visits as told by your health care provider. This is important. Contact a health care provider if:  You have redness or pressure sores on your head, face, mouth, or nose from the mask or head gear.  You have trouble using the CPAP or BPAP machine.  You cannot tolerate wearing the CPAP or BPAP mask.  Someone tells you that you snore even when wearing your CPAP or BPAP. Get help right away if:  You have trouble breathing.  You feel confused. Summary  CPAP and BPAP are methods that use air pressure to keep your airways open and to help you breathe well.  You may need to have a sleep study before your health care provider can order a machine for home use.  If you have trouble with the mask, it is very important that you talk with your health care provider about finding a way to make the mask easier to tolerate. Do not stop using the mask. There could be a negative impact to your health if you stop using the mask.  Follow instructions from your health care provider  about when to use the machine. This information is not intended to replace advice given to you by your health care provider. Make sure you discuss any questions you have with your health care provider. Document Revised: 02/02/2019 Document Reviewed: 02/05/2019 Elsevier Patient Education  2021 [employer].

## 2020-06-09 NOTE — Telephone Encounter (Signed)
LVM to see if patient would like to change 4:30 appt to a televisit, per [employer]. If patient calls back, please advise.

## 2020-06-09 NOTE — Progress Notes (Signed)
Reviewed and agree with assessment/plan.   Chesley Mires, MD [place] 06/09/2020, 5:02 PM Pager:  [phone]

## 2020-06-11 ENCOUNTER — Encounter: Payer: Self-pay | Admitting: Family Medicine

## 2020-06-11 ENCOUNTER — Ambulatory Visit (INDEPENDENT_AMBULATORY_CARE_PROVIDER_SITE_OTHER): Payer: No Typology Code available for payment source | Admitting: Family Medicine

## 2020-06-11 ENCOUNTER — Other Ambulatory Visit: Payer: Self-pay

## 2020-06-11 VITALS — BP 118/70 | HR 88 | Temp 97.5°F | Ht 62.0 in | Wt 195.0 lb

## 2020-06-11 DIAGNOSIS — M10072 Idiopathic gout, left ankle and foot: Secondary | ICD-10-CM

## 2020-06-11 DIAGNOSIS — M6528 Calcific tendinitis, other site: Secondary | ICD-10-CM

## 2020-06-11 MED ORDER — COLCHICINE 0.6 MG PO TABS: 0.6000 mg | ORAL_TABLET | Freq: Two times a day (BID) | ORAL | 1 refills | Status: DC

## 2020-06-11 MED ORDER — INDOMETHACIN 50 MG PO CAPS: 50.0000 mg | ORAL_CAPSULE | Freq: Three times a day (TID) | ORAL | 1 refills | Status: DC

## 2020-06-11 NOTE — Progress Notes (Signed)
[name], MD, [place]  [facility] and [place] at [facility] [place], [address]  Phone: [phone]  FAX: [place] - 40 y.o. female  MRN [id number]  Date of Birth: 08-19-1980  Date: 06/11/2020  PCP: Lesleigh Noe, MD  Referral: Lesleigh Noe, MD  Chief Complaint  Patient presents with  . Foot Pain    Left foot     This visit occurred during the SARS-CoV-2 public health emergency.  Safety protocols were in place, including screening questions prior to the visit, additional usage of staff PPE, and extensive cleaning of exam room while observing appropriate contact time as indicated for disinfecting solutions.   Subjective:   Kayla Sexton is a 40 y.o. very pleasant female patient with Body mass index is 35.67 kg/m. who presents with the following:  L foot pain:   She is a well-known patient, she presents to our office for evaluation of some pain on the dorsum of her left foot.  She did not have any kind of trauma or injury whatsoever.  This occurred out of the blue, a few days ago, and now it is hypersensitive to touch and is localized to the dorsum of the midfoot.  She did have a prior fifth digit bunionectomy with the second digit fusion done on 2015. She also has a calcaneal spur/calcific tendinitis, and her report to me notes that podiatry had a concern about potential future rupture and recommended operative intervention.   Review of Systems is noted in the HPI, as appropriate   Objective:   BP 118/70   Pulse 88   Temp (!) 97.5 F (36.4 C) (Temporal)   Ht 5\' 2"  (1.575 m)   Wt 195 lb (88.5 kg)   SpO2 98%   BMI 35.67 kg/m   GEN: No acute distress; alert,appropriate. PULM: Breathing comfortably in no respiratory distress PSYCH: Normally interactive.    She does walk with a mild limp.  Foot and ankle: On the left side the patient does have evidence of a  prior second digit surgery/fusion with no tenderness throughout the remainder of the digits as well as all metatarsals. Nontender throughout the entirety of the ankle, medial malleolus, lateral malleolus, and the entirety of the tibia and fibula.  All ankle ligaments are entirely intact and nontender The Achilles tendon is entirely nontender throughout.  As well as the plantar fascia.  She does have some modest swelling on the dorsum of the midfoot and this is tender to touch with minimal touch on my exam.  It also appears to be somewhat warm compared to the contralateral side with slight pink coloration.  Radiology: No results found.  Assessment and Plan:     ICD-10-CM   1. Acute idiopathic gout of left foot  M10.072   2. Calcific Achilles tendinitis of left lower extremity  M65.28    Without any trauma whatsoever and exam as above, gout versus CPPD is the most likely culprit.  I did have the patient take some scheduled Indocin, and if she is not having any improvement in a couple of days then she will start her colchicine.  She can send me a MyChart message or internal flag in any point with any questions or continued symptoms.  Secondary question: I think that Podiatry had a concern regarding her fractured heel spur with ultimately consideration of surgery.  I recommended that she seek a second opinion  from one of the orthopedic foot and ankle surgeons prior to consideration of any operative intervention.  I am sure that Dr. Doran Durand or Dr. Lucia Gaskins would be happy to see her.   Meds ordered this encounter  Medications  . indomethacin (INDOCIN) 50 MG capsule    Sig: Take 1 capsule (50 mg total) by mouth 3 (three) times daily with meals.    Dispense:  50 capsule    Refill:  1  . colchicine 0.6 MG tablet    Sig: Take 1 tablet (0.6 mg total) by mouth 2 (two) times daily.    Dispense:  60 tablet    Refill:  1   Medications Discontinued During This Encounter  Medication Reason  .  chlorpheniramine-HYDROcodone ([place]) 10-8 MG/5ML SUER Completed Course  . benzonatate (TESSALON) 100 MG capsule Completed Course    Signed,  Frederico Hamman T. Kalyiah Saintil, MD   Outpatient Encounter Medications as of 06/11/2020  Medication Sig  . albuterol (VENTOLIN HFA) 108 (90 Base) MCG/ACT inhaler Inhale 2 puffs into the lungs every 6 (six) hours as needed for wheezing or shortness of breath.  Marland Kitchen aspirin-acetaminophen-caffeine (EXCEDRIN MIGRAINE) 250-250-65 MG tablet Take 1 tablet by mouth every 6 (six) hours as needed for headache.  . colchicine 0.6 MG tablet Take 1 tablet (0.6 mg total) by mouth 2 (two) times daily.  . cyclobenzaprine (FLEXERIL) 10 MG tablet Take 10 mg by mouth daily as needed.  Marland Kitchen ibuprofen (ADVIL,MOTRIN) 200 MG tablet Take 400 mg by mouth every 6 (six) hours as needed for mild pain or moderate pain.   . indomethacin (INDOCIN) 50 MG capsule Take 1 capsule (50 mg total) by mouth 3 (three) times daily with meals.  . [DISCONTINUED] benzonatate (TESSALON) 100 MG capsule TAKE 1 CAPSULE (100 MG TOTAL) BY MOUTH 3 (THREE) TIMES DAILY AS NEEDED FOR COUGH. (Patient not taking: Reported on 06/09/2020)  . [DISCONTINUED] chlorpheniramine-HYDROcodone ([place]) 10-8 MG/5ML SUER TAKE 5 MLS BY MOUTH AT BEDTIME AS NEEDED FOR COUGH. (Patient not taking: Reported on 06/09/2020)   No facility-administered encounter medications on file as of 06/11/2020.

## 2020-06-11 NOTE — Patient Instructions (Signed)
Start the Indomethacin this afternoon.

## 2020-06-19 ENCOUNTER — Other Ambulatory Visit
Admission: RE | Admit: 2020-06-19 | Discharge: 2020-06-19 | Disposition: A | Discharge status: Home / Self Care | Payer: No Typology Code available for payment source | Source: Ambulatory Visit | Attending: Urology | Admitting: Urology

## 2020-06-19 ENCOUNTER — Encounter: Payer: Self-pay | Admitting: Urology

## 2020-06-19 ENCOUNTER — Telehealth: Payer: Self-pay

## 2020-06-19 ENCOUNTER — Ambulatory Visit
Admission: RE | Admit: 2020-06-19 | Discharge: 2020-06-19 | Disposition: A | Discharge status: Home / Self Care | Payer: No Typology Code available for payment source | Attending: Urology | Admitting: Urology

## 2020-06-19 ENCOUNTER — Other Ambulatory Visit: Payer: Self-pay

## 2020-06-19 ENCOUNTER — Ambulatory Visit
Admission: RE | Admit: 2020-06-19 | Discharge: 2020-06-19 | Disposition: A | Discharge status: Home / Self Care | Payer: No Typology Code available for payment source | Source: Ambulatory Visit | Attending: Urology | Admitting: Urology

## 2020-06-19 ENCOUNTER — Other Ambulatory Visit: Payer: Self-pay | Admitting: Urology

## 2020-06-19 ENCOUNTER — Ambulatory Visit (INDEPENDENT_AMBULATORY_CARE_PROVIDER_SITE_OTHER): Payer: No Typology Code available for payment source | Admitting: Urology

## 2020-06-19 VITALS — BP 112/73 | HR 96 | Ht 62.0 in | Wt 193.0 lb

## 2020-06-19 DIAGNOSIS — N200 Calculus of kidney: Secondary | ICD-10-CM | POA: Diagnosis not present

## 2020-06-19 DIAGNOSIS — R310 Gross hematuria: Secondary | ICD-10-CM | POA: Insufficient documentation

## 2020-06-19 DIAGNOSIS — R32 Unspecified urinary incontinence: Secondary | ICD-10-CM

## 2020-06-19 LAB — URINALYSIS, COMPLETE
Bilirubin, UA: NEGATIVE
Glucose, UA: NEGATIVE
Ketones, UA: NEGATIVE
Leukocytes,UA: NEGATIVE
Nitrite, UA: NEGATIVE
Specific Gravity, UA: 1.025 (ref 1.005–1.030)
Urobilinogen, Ur: 0.2 mg/dL (ref 0.2–1.0)
pH, UA: 5.5 (ref 5.0–7.5)

## 2020-06-19 LAB — MICROSCOPIC EXAMINATION: RBC, Urine: >30 /hpf — AB (ref 0–2)

## 2020-06-19 LAB — PREGNANCY, URINE: Preg Test, Ur: NEGATIVE

## 2020-06-19 MED ORDER — CEFUROXIME AXETIL 500 MG PO TABS: 500.0000 mg | ORAL_TABLET | Freq: Two times a day (BID) | ORAL | 0 refills | Status: DC

## 2020-06-19 NOTE — Progress Notes (Signed)
Ceftin sent to pharmacy.

## 2020-06-19 NOTE — Progress Notes (Signed)
06/19/2020 2:52 PM   Kayla Sexton April 07, 1980 [id number]  Referring provider: Lesleigh Noe, MD [place],  [place] [address]  Chief Complaint  Patient presents with  . Hematuria   Urological history: 1. Nephrolithiasis -stone composition of 90% calcium oxalate and 10% uric acid  -24 hour metabolic work up noted extremely low urine volume, hypercalciuria, very low urine pH, moderate calcium oxalate stone risk, mild calcium phosphate stone risk and extremely uric acid super saturation -Right URS 06/2017, Left URS 02/2018 and Right URS 03/2018 -KUB 06/19/2020 -UA > 30 RBC's    HPI: Kayla Sexton is a 40 y.o. female who presents today for gross hematuria, lower abdominal pain and bladder leakage.    She started seeing blood in her urine two days ago associated with LLQ pain and right flank pain.  She has also had dysuria and urinary incontinence.  Patient denies any modifying or aggravating factors.  Patient denies any fevers, chills, nausea or vomiting.   UA > 30 RBC's.  Patient states she has started her menses.  KUB no stones visualized, but she has a history of uric acid stones as well.   PMH: Past Medical History:  Diagnosis Date  . Anemia   . Cancer ([place]) 2006   CERVICAL   . CIN III (cervical intraepithelial neoplasia III)   . Dysuria   . Family history of adverse reaction to anesthesia    DAD AND BROTHER AND MOM-N/V  . GERD (gastroesophageal reflux disease)    OCC  . Hematuria   . HH (hiatus hernia)   . History of cervical dysplasia 2006   CIN1 and CIN2  . History of kidney stones   . Migraines   . PCOS (polycystic ovarian syndrome)    Dr. Helane Rima  . PONV (postoperative nausea and vomiting)    HARD TO WAKE UP  . Right ureteral calculus   . Sleep apnea    DOES NOT USE CPAP    Surgical History: Past Surgical History:  Procedure Laterality Date  . CERVICAL BIOPSY  W/ LOOP ELECTRODE EXCISION  12-04-2004   dr Helane Rima  [facility]  . COLONOSCOPY   03/2008   Dr. Michail Sermon  . COMBINED HYSTEROSCOPY DIAGNOSTIC / D&C  02-28-2012   dr Helane Rima   @ [facility]  . CYSTOSCOPY W/ RETROGRADES Right 03/31/2018   Procedure: CYSTOSCOPY WITH RETROGRADE PYELOGRAM;  Surgeon: Billey Co, MD;  Location: [facility];  Service: Urology;  Laterality: Right;  . CYSTOSCOPY WITH RETROGRADE PYELOGRAM, URETEROSCOPY AND STENT PLACEMENT Bilateral 07/21/2017   Procedure: CYSTOSCOPY WITH BILATERAL  RETROGRADE BILATERAL URETEROSCOPY AND STENTS  PLACEMENT;  Surgeon: Irine Seal, MD;  Location[facility];  Service: Urology;  Laterality: Bilateral;  . CYSTOSCOPY WITH RETROGRADE URETHROGRAM Left 01/04/2020   Procedure: CYSTOSCOPY WITH RETROGRADE URETHROGRAM;  Surgeon: Billey Co, MD;  Location: [facility];  Service: Urology;  Laterality: Left;  . CYSTOSCOPY/URETEROSCOPY/HOLMIUM LASER/STENT PLACEMENT Left 03/17/2018   Procedure: CYSTOSCOPY/URETEROSCOPY/HOLMIUM LASER/STENT PLACEMENT;  Surgeon: Billey Co, MD;  Location: [facility];  Service: Urology;  Laterality: Left;  . CYSTOSCOPY/URETEROSCOPY/HOLMIUM LASER/STENT PLACEMENT Right 03/31/2018   Procedure: CYSTOSCOPY/URETEROSCOPY/HOLMIUM LASER/STENT PLACEMENT;  Surgeon: Billey Co, MD;  Location: [facility];  Service: Urology;  Laterality: Right;  . CYSTOSCOPY/URETEROSCOPY/HOLMIUM LASER/STENT PLACEMENT Left 01/04/2020   Procedure: CYSTOSCOPY/URETEROSCOPY/HOLMIUM LASER;  Surgeon: Billey Co, MD;  Location: [facility];  Service: Urology;  Laterality: Left;  . endoscopy  2010   Dr. Amedeo Plenty  . FINGER SURGERY Right  MIDDLE FINGER  . FOOT SURGERY Left 2015   5th bunionectomy w/ 2nd digit w/ fusion , retained hardware w/ 5th toe  . HOLMIUM LASER APPLICATION Bilateral 05/03/8117   Procedure: HOLMIUM LASER APPLICATION;  Surgeon: Irine Seal, MD;  Location: [facility];  Service: Urology;  Laterality: Bilateral;    Home Medications:  Allergies as of 06/19/2020      Reactions   Adhesive [tape]  Swelling   Per pt only the 12 lead ekg pads cause "welps and abrasions"-ALSO SOME BANDAIDS CAUSE IRRITATION-OK TO USE PAPER TAPE   Other    RED MEAT-ANAPHYLAXIS   Vancomycin Hives, Itching   Bactrim [sulfamethoxazole-trimethoprim] Rash      Medication List       Accurate as of Jun 19, 2020 11:59 PM. If you have any questions, ask your nurse or doctor.        albuterol 108 (90 Base) MCG/ACT inhaler Commonly known as: VENTOLIN HFA Inhale 2 puffs into the lungs every 6 (six) hours as needed for wheezing or shortness of breath.   aspirin-acetaminophen-caffeine 250-250-65 MG tablet Commonly known as: EXCEDRIN MIGRAINE Take 1 tablet by mouth every 6 (six) hours as needed for headache.   cefUROXime 500 MG tablet Commonly known as: CEFTIN Take 1 tablet (500 mg total) by mouth 2 (two) times daily with a meal. Started by: Larene Beach Jessicah Croll, PA-C   colchicine 0.6 MG tablet Take 1 tablet (0.6 mg total) by mouth 2 (two) times daily.   cyclobenzaprine 10 MG tablet Commonly known as: FLEXERIL Take 10 mg by mouth daily as needed.   ibuprofen 200 MG tablet Commonly known as: ADVIL Take 400 mg by mouth every 6 (six) hours as needed for mild pain or moderate pain.   indomethacin 50 MG capsule Commonly known as: INDOCIN Take 1 capsule (50 mg total) by mouth 3 (three) times daily with meals.       Allergies:  Allergies  Allergen Reactions  . Adhesive [Tape] Swelling    Per pt only the 12 lead ekg pads cause "welps and abrasions"-ALSO SOME BANDAIDS CAUSE IRRITATION-OK TO USE PAPER TAPE  . Other     RED MEAT-ANAPHYLAXIS  . Vancomycin Hives and Itching  . Bactrim [Sulfamethoxazole-Trimethoprim] Rash    Family History: Family History  Problem Relation Age of Onset  . Healthy Mother   . Healthy Father   . Breast cancer Paternal Grandmother   . Breast cancer Other        Maternal Great Aunt    Social History:  reports that she has never smoked. She has never used smokeless  tobacco. She reports that she does not drink alcohol and does not use drugs.  ROS: Pertinent ROS in HPI  Physical Exam: BP 112/73   Pulse 96   Ht 5\' 2"  (1.575 m)   Wt 193 lb (87.5 kg)   BMI 35.30 kg/m   Constitutional:  Well nourished. Alert and oriented, No acute distress. HEENT: [place] AT, mask in place.  Trachea midline Cardiovascular: No clubbing, cyanosis, or edema. Respiratory: Normal respiratory effort, no increased work of breathing. Neurologic: Grossly intact, no focal deficits, moving all 4 extremities. Psychiatric: Normal mood and affect.   Laboratory Data: Lab Results  Component Value Date   HGBA1C 5.3 03/13/2020    Urinalysis Component     Latest Ref Rng & Units 06/19/2020          Specific Gravity, UA     1.005 - 1.030 1.025  pH, UA  5.0 - 7.5 5.5  Color, UA     Yellow Orange  Appearance Ur     Clear Cloudy (A)  Leukocytes,UA     Negative Negative  Protein,UA     Negative/Trace 1+ (A)  Glucose, UA     Negative Negative  Ketones, UA     Negative Negative  RBC, UA     Negative 3+ (A)  Bilirubin, UA     Negative Negative  Urobilinogen, Ur     0.2 - 1.0 mg/dL 0.2  Nitrite, UA     Negative Negative  Microscopic Examination      See below:   Component     Latest Ref Rng & Units 06/19/2020          WBC, UA     0 - 5 /hpf 0-5  RBC     0 - 2 /hpf >30 (A)  Epithelial Cells (non renal)     0 - 10 /hpf 0-10  Bacteria, UA     None seen/Few Few  I have reviewed the labs.   Pertinent Imaging: CLINICAL DATA:  40 year old female with left lower quadrant abdominal pain. Nephrolithiasis.  EXAM: ABDOMEN - 1 VIEW  COMPARISON:  Abdominal radiograph dated 12/18/2019  FINDINGS: There is no bowel dilatation or evidence of obstruction. No free air or radiopaque calculi over the renal silhouettes. Several pelvic phleboliths noted. The osseous structures and soft tissues are unremarkable.  IMPRESSION: Negative.   Electronically Signed    By: Anner Crete M.D.   On: 06/20/2020 22:00    CLINICAL DATA:  Bilateral flank pain for 2 days.  Nephrolithiasis.  EXAM: CT ABDOMEN AND PELVIS WITHOUT CONTRAST  TECHNIQUE: Multidetector CT imaging of the abdomen and pelvis was performed following the standard protocol without IV contrast.  COMPARISON:  12/28/2019  FINDINGS: Lower chest: No acute findings.  Hepatobiliary: No mass visualized on this unenhanced exam. Gallbladder is unremarkable. No evidence of biliary ductal dilatation.  Pancreas: No mass or inflammatory process visualized on this unenhanced exam.  Spleen:  Within normal limits in size.  Adrenals/Urinary tract: 3 mm calculus seen in upper pole of left kidney. No evidence of ureteral calculi or hydronephrosis. Empty urinary bladder noted.  Stomach/Bowel: No evidence of obstruction, inflammatory process, or abnormal fluid collections. Normal appendix visualized.  Vascular/Lymphatic: No pathologically enlarged lymph nodes identified. No evidence of abdominal aortic aneurysm.  Reproductive:  No mass or other significant abnormality.  Other:  None.  Musculoskeletal:  No suspicious bone lesions identified.  IMPRESSION: 3 mm left renal calculus. No evidence of ureteral calculi, hydronephrosis, or other acute findings.   Electronically Signed   By: Marlaine Hind M.D.   On: 06/19/2020 14:37 I have independently reviewed the films.  See HPI.   Assessment & Plan:    1. Gross hematuria -may be secondary to her starting her menstrual cycle, but patient stated that she rarely has her period so she wanted to be sure it was not the result of passing a stone so a CT renal stone study was pursued -CT renal stone study did not demonstrate an ureteral stone or hydronephrosis  2. Abdominal pain -likely secondary to menstrual cycle as CT scan did not demonstrate an ureteral stone or hydronephrosis  3. Incontinence -UA is sent for culture to  rule out infection as a cause for her recent bladder leakage  She has a follow up with Dr. Diamantina Providence in June and will keep that appointment    These notes generated with voice  recognition software. I apologize for typographical errors.  Zara Council, PA-C  [facility] [address]  [place], [place] [address] [phone]

## 2020-06-19 NOTE — Telephone Encounter (Signed)
CT renal stone study PA approved 06/19/2020 - 06-25/2022. PA # [id number]

## 2020-06-23 LAB — CULTURE, URINE COMPREHENSIVE

## 2020-07-02 ENCOUNTER — Encounter: Payer: Self-pay | Admitting: Family Medicine

## 2020-07-02 ENCOUNTER — Other Ambulatory Visit (HOSPITAL_COMMUNITY): Payer: Self-pay

## 2020-07-02 MED ORDER — PREDNISONE 20 MG PO TABS
ORAL_TABLET | ORAL | 0 refills | Status: AC
  Filled 2020-07-02: qty 15, 10d supply, fill #0

## 2020-07-04 ENCOUNTER — Other Ambulatory Visit (HOSPITAL_COMMUNITY): Payer: Self-pay

## 2020-07-04 ENCOUNTER — Other Ambulatory Visit: Payer: Self-pay

## 2020-07-07 ENCOUNTER — Other Ambulatory Visit (HOSPITAL_COMMUNITY): Payer: Self-pay

## 2020-07-07 MED ORDER — CYCLOBENZAPRINE HCL 10 MG PO TABS
10.0000 mg | ORAL_TABLET | Freq: Every day | ORAL | 3 refills | Status: DC
  Filled 2020-07-07: qty 30, 30d supply, fill #0

## 2020-07-08 ENCOUNTER — Encounter: Payer: Self-pay | Admitting: Family Medicine

## 2020-07-08 DIAGNOSIS — Z20822 Contact with and (suspected) exposure to covid-19: Secondary | ICD-10-CM

## 2020-07-09 ENCOUNTER — Other Ambulatory Visit: Payer: Self-pay

## 2020-07-09 ENCOUNTER — Ambulatory Visit: Payer: No Typology Code available for payment source | Admitting: Urology

## 2020-07-09 ENCOUNTER — Other Ambulatory Visit: Payer: No Typology Code available for payment source

## 2020-07-09 DIAGNOSIS — Z20822 Contact with and (suspected) exposure to covid-19: Secondary | ICD-10-CM

## 2020-07-10 LAB — NOVEL CORONAVIRUS, NAA: SARS-CoV-2, NAA: NOT DETECTED

## 2020-07-10 LAB — SARS-COV-2, NAA 2 DAY TAT

## 2020-07-11 ENCOUNTER — Other Ambulatory Visit: Payer: Self-pay | Admitting: Family Medicine

## 2020-07-15 ENCOUNTER — Other Ambulatory Visit (HOSPITAL_COMMUNITY): Payer: Self-pay

## 2020-08-13 ENCOUNTER — Telehealth: Payer: Self-pay | Admitting: Physician Assistant

## 2020-08-13 ENCOUNTER — Telehealth: Payer: Self-pay | Admitting: Family Medicine

## 2020-08-13 ENCOUNTER — Encounter: Payer: No Typology Code available for payment source | Admitting: Physician Assistant

## 2020-08-13 DIAGNOSIS — U071 COVID-19: Secondary | ICD-10-CM

## 2020-08-13 MED ORDER — BENZONATATE 100 MG PO CAPS: 100.0000 mg | ORAL_CAPSULE | Freq: Three times a day (TID) | ORAL | 0 refills | Status: DC | PRN

## 2020-08-13 MED ORDER — MOLNUPIRAVIR EUA 200MG CAPSULE: 4.0000 | ORAL_CAPSULE | Freq: Two times a day (BID) | ORAL | 0 refills | Status: DC

## 2020-08-13 MED ORDER — MOLNUPIRAVIR EUA 200MG CAPSULE: 4.0000 | ORAL_CAPSULE | Freq: Two times a day (BID) | ORAL | 0 refills | Status: AC

## 2020-08-13 NOTE — Progress Notes (Signed)
Duplicate encounter. Patient had issue with pharmacy note receiving medication.

## 2020-08-13 NOTE — Telephone Encounter (Signed)
Would be OK adding on a virtual visit if patient wants medication

## 2020-08-13 NOTE — Telephone Encounter (Signed)
Noted  

## 2020-08-13 NOTE — Telephone Encounter (Signed)
Pt called wanting to be treated for covid without an appointment. Pt was advised that she would need an appointment that we could not treat her without evaluating her. Pt states she had no money for appointment. She has changed PCP's and was advised to contact her new pcp. She states they would not do anything for her, didn't help her before, just to "ride it out" . She states that she would " just suffer thru it and hope she didn't die or get put on ventilator." She was again advised to contact her current pcp for treatment.

## 2020-08-13 NOTE — Telephone Encounter (Addendum)
Called patient to offer her the virtual visit work in time with Einar Pheasant at 1230 today for treatment.  She agreed.    Note:  At this point, Dr. Einar Pheasant is still patient's PCP as Dr. Glori Bickers was not comfortable accepting patient as a transfer given concern for conflict of interest due to patient used to work here and left in poor standing due to walking out on the job.   I did speak with patient about her behavior and the demands as reported by staff (see their documentation).  The report I received is that she "hated Dr. Einar Pheasant" and "hated" the office supervisor hanging up on her before supervisor could pick up the phone to help her when she initially called.  Patient then continued to call back and speak with another agent expressing demands and negativity about others in the office.   I made patient aware that it was not acceptable behavior to make unrealistic demands and then become angry and make derogatory comments about other staff members who were trying to help her.   I told her we would continue to make appointment to address and provide her care for the 1230 today.  At this time patient became angry and stated that "I did not say I hate anyone and did not make demands, your staff is the one with the problem, not me".  She then stated that she refuses the appt now with [facility] and to cancel it.  As a result, I did not make the appointment.   FYI to Dr. Einar Pheasant.

## 2020-08-13 NOTE — Telephone Encounter (Signed)
Kayla Sexton called in and wanted to know about who she spoke to first and I said I did not know her name and she stated that the girl told her that she would have to send a message to her dr and then she was put on hold to speak with a manager and she hung up due to she knew it was to speak with Kailen and she stated that she hated Raquel Sarna and Dr. Einar Pheasant. And how she wanted me to send Dr. Darnell Level a message to send in medication due to he saw her boyfriend virtually and he was aware that she may have it also and wanted to know if Dr. Darnell Level can send it due to she covid +. 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

## 2020-08-13 NOTE — Telephone Encounter (Signed)
Pt called she wanted to be seen by Dr.Tower asap, she said that she was no longer a pt of Dr. Einar Pheasant. She told me that I needed to check her chart because it was in there. Then she stated that her boyfriend just saw Dr.G an she knew that he has 4:30 opens an to schedule her there. I told her I would place her on a brief hold and give her to my supervisor she then hung up She also stated that she hated Dr.Cody and Raquel Sarna

## 2020-08-13 NOTE — Telephone Encounter (Signed)
Did home test yesterday with headache, cough, chest and nasal congestion, 99.8 temp. She does not have insurance. She is aware she may need virtual visit for any medication.

## 2020-08-13 NOTE — Telephone Encounter (Signed)
Kayla Sexton called in wanted to know about getting covid medication. And she stated that her boyfriend and he had covid and now she possibly has covid.

## 2020-08-13 NOTE — Addendum Note (Signed)
Addended by: Brunetta Jeans on: 08/13/2020 02:52 PM   Modules accepted: Orders, Level of Service

## 2020-08-13 NOTE — Progress Notes (Signed)
Patient initially shown to be connected to video. No one there when I joined. Sent multiple direct links to patient cell to join. Waited > 10 minutes with no response. Will keep an eye out to see if patient eventually connects. Otherwise -- erroneous encounter.
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
## 2020-08-13 NOTE — Patient Instructions (Addendum)
Kayla Sexton, thank you for joining Leeanne Rio, PA-C for today's virtual visit.  While this provider is not your primary care provider (PCP), if your PCP is located in our provider database this encounter information will be shared with them immediately following your visit.  Consent: (Patient) Kayla Sexton provided verbal consent for this virtual visit at the beginning of the encounter.  Current Medications:  Current Outpatient Medications:    benzonatate (TESSALON) 100 MG capsule, Take 1 capsule (100 mg total) by mouth 3 (three) times daily as needed for cough., Disp: 30 capsule, Rfl: 0   molnupiravir EUA 200 mg CAPS, Take 4 capsules (800 mg total) by mouth 2 (two) times daily for 5 days., Disp: 40 capsule, Rfl: 0   albuterol (VENTOLIN HFA) 108 (90 Base) MCG/ACT inhaler, Inhale 2 puffs into the lungs every 6 (six) hours as needed for wheezing or shortness of breath., Disp: 8 g, Rfl: 1   aspirin-acetaminophen-caffeine (EXCEDRIN MIGRAINE) 250-250-65 MG tablet, Take 1 tablet by mouth every 6 (six) hours as needed for headache., Disp: , Rfl:    cefUROXime (CEFTIN) 500 MG tablet, Take 1 tablet (500 mg total) by mouth 2 (two) times daily with a meal., Disp: 14 tablet, Rfl: 0   colchicine 0.6 MG tablet, TAKE 1 TABLET BY MOUTH 2 TIMES DAILY., Disp: 60 tablet, Rfl: 1   cyclobenzaprine (FLEXERIL) 10 MG tablet, Take 10 mg by mouth daily as needed., Disp: , Rfl:    cyclobenzaprine (FLEXERIL) 10 MG tablet, Take 1 tablet (10 mg total) by mouth daily., Disp: 30 tablet, Rfl: 3   ibuprofen (ADVIL,MOTRIN) 200 MG tablet, Take 400 mg by mouth every 6 (six) hours as needed for mild pain or moderate pain. , Disp: , Rfl:    indomethacin (INDOCIN) 50 MG capsule, Take 1 capsule (50 mg total) by mouth 3 (three) times daily with meals., Disp: 50 capsule, Rfl: 1   Medications ordered in this encounter:  Meds ordered this encounter  Medications   molnupiravir EUA 200 mg CAPS    Sig: Take 4 capsules (800 mg  total) by mouth 2 (two) times daily for 5 days.    Dispense:  40 capsule    Refill:  0    Order Specific Question:   Supervising Provider    Answer:   MILLER, BRIAN [[id number]]   benzonatate (TESSALON) 100 MG capsule    Sig: Take 1 capsule (100 mg total) by mouth 3 (three) times daily as needed for cough.    Dispense:  30 capsule    Refill:  0    Order Specific Question:   Supervising Provider    Answer:   Sabra Heck, [place]     *If you need refills on other medications prior to your next appointment, please contact your pharmacy*  Follow-Up: Call back or seek an in-person evaluation if the symptoms worsen or if the condition fails to improve as anticipated.  Other Instructions Please keep well-hydrated and get plenty of rest. Start a saline nasal rinse to flush out your nasal passages. You can use plain Mucinex to help thin congestion. If you have a humidifier, running in the bedroom at night. I want you to start OTC vitamin D3 1000 units daily, vitamin C 1000 mg daily, and a zinc supplement. Please take prescribed medications as directed.  You have been enrolled in a MyChart symptom monitoring program. Please answer these questions daily so we can keep track of how you are doing.  You were to  quarantine for 5 days from onset of your symptoms.  After day 5, if you have had no fever and you are feeling better, you can end quarantine but need to mask for an additional 5 days. After day 5 if you have a fever or are having significant symptoms, please quarantine for full 10 days.  If you note any worsening of symptoms, any significant shortness of breath or any chest pain, please seek ER evaluation ASAP.  Please do not delay care!    If you have been instructed to have an in-person evaluation today at a local Urgent Care facility, please use the link below. It will take you to a list of all of our available [facility], including address, phone number and hours of  operation. Please do not delay care.  [facility]  If you or a family member do not have a primary care provider, use the link below to schedule a visit and establish care. When you choose a [facility] primary care physician or advanced practice provider, you gain a long-term partner in health. Find a Primary Care Provider  Learn more about 's in-office and virtual care options: [facility]

## 2020-08-29 ENCOUNTER — Telehealth: Payer: Self-pay | Admitting: Pulmonary Disease

## 2020-08-29 NOTE — Telephone Encounter (Signed)
Call made to [facility], confirmed patient info, inquired regarding status of cpap order. Aware there is a Producer, television/film/video. Per Col[place]ta [place] they do have the order but due to the shortage they are unable to tell us when they will get new machines.   Call made to patient, confirmed DOB. Made aware of shortage. Patient states she now has new insurance that will take effect starting in September. I made here aware she will need to get [place] and the DME company the updated the insurance information. She requests that the order be mailed to her in case she wants to buy a new machine on her on. Order printed and placed upfront to be mailed.   Nothing further needed at this time.

## 2020-10-31 ENCOUNTER — Telehealth: Payer: Self-pay

## 2020-10-31 NOTE — Telephone Encounter (Signed)
Patient called clinic today complaining of left flank pain and mild hematuria. She states this is her usual pain when she has had a kidney stone. I offered pt to come to clinic right now pt was unable to as she lives in [place]. I offered pt an appointment with PA Sam at 11:30 am and to get KUB prior to appointment. Pt declined getting KUB as she states we will not be able to see her stone on x-ray. Pt declined appointment for today and only wants to see Dr. Diamantina Providence and asked when next available is. I offered pt appointment for Tuesday in [place] which she declined and she is coming in to see Dr. Diamantina Providence on Thursday 11/06/2020. I advised pt if her pain gets worse or if she feels she needs STAT imaging as she refused x-ray she will need to go to ER. Pt states she will not go to ER as it is an 18 hour wait and she does not have time for that.

## 2020-11-04 ENCOUNTER — Encounter: Payer: Self-pay | Admitting: Urology

## 2020-11-04 ENCOUNTER — Ambulatory Visit
Admission: RE | Admit: 2020-11-04 | Discharge: 2020-11-04 | Disposition: A | Discharge status: Home / Self Care | Payer: 59 | Source: Ambulatory Visit | Attending: Urology | Admitting: Urology

## 2020-11-04 ENCOUNTER — Other Ambulatory Visit
Admission: RE | Admit: 2020-11-04 | Discharge: 2020-11-04 | Disposition: A | Discharge status: Home / Self Care | Payer: 59 | Source: Home / Self Care | Attending: Urology | Admitting: Urology

## 2020-11-04 ENCOUNTER — Ambulatory Visit (INDEPENDENT_AMBULATORY_CARE_PROVIDER_SITE_OTHER): Payer: 59 | Admitting: Urology

## 2020-11-04 ENCOUNTER — Other Ambulatory Visit: Payer: Self-pay | Admitting: *Deleted

## 2020-11-04 ENCOUNTER — Other Ambulatory Visit: Payer: Self-pay

## 2020-11-04 VITALS — BP 123/91 | HR 106 | Ht 62.0 in | Wt 195.0 lb

## 2020-11-04 DIAGNOSIS — R1012 Left upper quadrant pain: Secondary | ICD-10-CM

## 2020-11-04 DIAGNOSIS — Z87442 Personal history of urinary calculi: Secondary | ICD-10-CM

## 2020-11-04 DIAGNOSIS — R310 Gross hematuria: Secondary | ICD-10-CM | POA: Insufficient documentation

## 2020-11-04 LAB — URINALYSIS, COMPLETE (UACMP) WITH MICROSCOPIC
Bilirubin Urine: NEGATIVE
Glucose, UA: NEGATIVE mg/dL
Ketones, ur: NEGATIVE mg/dL
Nitrite: NEGATIVE
Specific Gravity, Urine: 1.025 (ref 1.005–1.030)
pH: 5.5 (ref 5.0–8.0)

## 2020-11-04 MED ORDER — TAMSULOSIN HCL 0.4 MG PO CAPS: 0.4000 mg | ORAL_CAPSULE | Freq: Every day | ORAL | 0 refills | Status: DC

## 2020-11-04 NOTE — Progress Notes (Signed)
   11/04/2020 1:44 PM   Kayla Sexton 16-Dec-1980 [id number]  Reason for visit: Left flank pain  HPI: 40 year old female with extensive history of uric acid nephrolithiasis requiring ureteroscopy x3 in the past, most recently on the left side in December 2021 for a 1 cm renal pelvis stone.  She reports about 3 weeks of left-sided flank pain and abdominal pain that feels similar to prior stone episodes.  She denies any fevers or chills, some mild urinary frequency but no dysuria or other urinary symptoms.  Urinalysis today with 0-5 squamous cells, 11-20 WBCs, 20-50 RBCs, few bacteria, trace leukocytes, nitrite negative, will send for culture.  I personally viewed and interpreted the CT from May 2022 that shows a 3 mm left upper pole stone but no hydronephrosis or other renal or ureteral stones.  We discussed options including Flomax for trial of medical expulsive therapy versus repeat CT scan.  She opted for a repeat CT scan, and we will order this stat.  Return precautions discussed extensively.  She has not done well with ureteral stents in the past, and would like to avoid surgery if at all possible.  Stat CT stone protocol ordered, call with results  [place], [place] [address] [place], [place] [address] [phone]

## 2020-11-04 NOTE — Addendum Note (Signed)
Addended by: Donalee Citrin on: 11/04/2020 02:06 PM   Modules accepted: Orders

## 2020-11-05 ENCOUNTER — Telehealth: Payer: Self-pay | Admitting: Urology

## 2020-11-05 NOTE — Telephone Encounter (Signed)
Per Dr. Isabel Caprice Patient is to be scheduled for Left Ureteroscopy,Laser Lithotripsy, Ureteral Stent Placement.  Mikyah Alamo was contacted by Surgery Coordinator Denny Peon and possible surgical dates were discussed, 11/11/20 was agreed upon for surgery. Patient was directed to call [phone] between 1-3pm the day before surgery to find out surgical arrival time.  Instructions were given not to eat or drink from midnight on the night before surgery and have a driver for the day of surgery. On the surgery day patient was instructed to enter through the [place] entrance of [facility] report the Same Day Surgery desk.   Pre-Admit Testing will be in contact via phone to set up an interview with the anesthesia team to review your history and medications prior to surgery.   Reminder of this information was sent via mychart to the patient.

## 2020-11-06 ENCOUNTER — Other Ambulatory Visit: Payer: Self-pay | Admitting: Urology

## 2020-11-06 ENCOUNTER — Ambulatory Visit: Payer: No Typology Code available for payment source | Admitting: Urology

## 2020-11-06 DIAGNOSIS — N200 Calculus of kidney: Secondary | ICD-10-CM

## 2020-11-06 LAB — URINE CULTURE: Culture: <10000 — AB

## 2020-11-06 MED ORDER — HYDROCODONE-ACETAMINOPHEN 5-325 MG PO TABS: 1.0000 | ORAL_TABLET | Freq: Four times a day (QID) | ORAL | 0 refills | Status: AC | PRN

## 2020-11-06 NOTE — Progress Notes (Signed)
[facility] Posting Form   Surgery Date/Time: Date: 11/11/2020  Surgeon: Dr. Nickolas Madrid, MD  Surgery Location: Day Surgery  Inpt ( No  )   Outpt (Yes)   Obs ( No  )   Diagnosis: N20.0 Left Nephrolithiasis  -CPT: 24097  Surgery: Left Ureteroscopy with laser lithotripsy and stent placement  Stop Anticoagulations: Yes; Ok to continue ASA  Cardiac/Medical/Pulmonary Clearance needed: none needed  *Orders entered into EPIC  Date: 11/06/20   *Case booked in [place]  Date: 11/06/20  *Notified pt of Surgery: Date: 11/05/2020  PRE-OP UA & CX: no  *Placed into Prior Authorization Work Que Date: 11/06/20   Assistant/laser/rep:No

## 2020-11-06 NOTE — Progress Notes (Signed)
Surgical Physician Order Form  * Scheduling expectation : 11/11/2020  *Length of Case: 1 hour  *Clearance needed: no  *Anticoagulation Instructions: May continue all anticoagulants  *Aspirin Instructions: Ok to continue all  *Post-op visit Date/Instructions:  1 month follow up  *Diagnosis: Left Nephrolithiasis  *Procedure: Ureteroscopy w/laser lithotripsy & stent placement/exchange ([id number])  -Admit type: OUTpatient  -Anesthesia: General  -VTE Prophylaxis Standing Order SCD's       Other:   -Standing Lab Orders Per Anesthesia    Lab other: None  -Standing Test orders EKG/Chest x-ray per Anesthesia       Test other:   - Medications:     Cipro 400mg  IV   Other Instructions:

## 2020-11-10 ENCOUNTER — Other Ambulatory Visit: Payer: Self-pay

## 2020-11-10 ENCOUNTER — Encounter
Admission: RE | Admit: 2020-11-10 | Discharge: 2020-11-10 | Disposition: A | Discharge status: Home / Self Care | Payer: 59 | Source: Ambulatory Visit | Attending: Urology | Admitting: Urology

## 2020-11-10 NOTE — Patient Instructions (Addendum)
Your procedure is scheduled on: 11/11/20 Report to [facility]. To find out your arrival time please call [phone] between 1PM - 3PM on 11/10/20.  Remember: Instructions that are not followed completely may result in serious medical risk, up to and including death, or upon the discretion of your surgeon and anesthesiologist your surgery may need to be rescheduled.     _X__ 1. Do not eat food or drink liquids after midnight the night before your procedure.                 No gum chewing or hard candies.   __X__2.  On the morning of surgery brush your teeth with toothpaste and water, you                 may rinse your mouth with mouthwash if you wish.  Do not swallow any              toothpaste of mouthwash.     _X__ 3.  No Alcohol for 24 hours before or after surgery.   _X__ 4.  Do Not Smoke or use e-cigarettes For 24 Hours Prior to Your Surgery.                 Do not use any chewable tobacco products for at least 6 hours prior to                 surgery.  ____  5.  Bring all medications with you on the day of surgery if instructed.   __X__  6.  Notify your doctor if there is any change in your medical condition      (cold, fever, infections).     Do not wear jewelry, make-up, hairpins, clips or nail polish. Do not wear lotions, powders, or perfumes.  Do not shave 48 hours prior to surgery. Men may shave face and neck. Do not bring valuables to the hospital.    [facility] is not responsible for any belongings or valuables.  Contacts, dentures/partials or body piercings may not be worn into surgery. Bring a case for your contacts, glasses or hearing aids, a denture cup will be supplied. Leave your suitcase in the car. After surgery it may be brought to your room. For patients admitted to the hospital, discharge time is determined by your treatment team.   Patients discharged the day of surgery will not be allowed to drive  home.   Please read over the following fact sheets that you were given:    __X__ Take these medicines the morning of surgery with A SIP OF WATER:    1. colchicine 0.6 MG tablet  2.   3.   4.  5.  6.  ____ Fleet Enema (as directed)   ____ Use CHG Soap/SAGE wipes as directed  ____ Use inhalers on the day of surgery  ____ Stop metformin/Janumet/Farxiga 2 days prior to surgery    ____ Take 1/2 of usual insulin dose the night before surgery. No insulin the morning          of surgery.   ____ Stop Blood Thinners Coumadin/Plavix/Xarelto/Pleta/Pradaxa/Eliquis/Effient/Aspirin  on   Or contact your Surgeon, Cardiologist or Medical Doctor regarding  ability to stop your blood thinners  __X__ Stop Anti-inflammatories 7 days before surgery such as Advil, Ibuprofen, Motrin,  BC or Goodies Powder, Naprosyn, Naproxen, Aleve, Aspirin    __X__ Stop all herbal supplements, fish oil or vitamin E  until after surgery.    ____ Bring C-Pap to the hospital.

## 2020-11-11 ENCOUNTER — Ambulatory Visit: Payer: 59 | Admitting: Anesthesiology

## 2020-11-11 ENCOUNTER — Encounter: Payer: Self-pay | Admitting: Urology

## 2020-11-11 ENCOUNTER — Ambulatory Visit
Admission: RE | Admit: 2020-11-11 | Discharge: 2020-11-11 | Disposition: A | Discharge status: Home / Self Care | Payer: 59 | Attending: Urology | Admitting: Urology

## 2020-11-11 ENCOUNTER — Other Ambulatory Visit: Payer: Self-pay

## 2020-11-11 ENCOUNTER — Ambulatory Visit: Payer: 59 | Admitting: Urgent Care

## 2020-11-11 ENCOUNTER — Ambulatory Visit: Payer: 59

## 2020-11-11 ENCOUNTER — Encounter
Admission: RE | Disposition: A | Discharge status: Home / Self Care | Payer: Self-pay | Source: Home / Self Care | Attending: Urology

## 2020-11-11 DIAGNOSIS — N200 Calculus of kidney: Secondary | ICD-10-CM | POA: Diagnosis not present

## 2020-11-11 DIAGNOSIS — Z86001 Personal history of in-situ neoplasm of cervix uteri: Secondary | ICD-10-CM | POA: Diagnosis not present

## 2020-11-11 HISTORY — PX: CYSTOSCOPY W/ RETROGRADES: SHX1426

## 2020-11-11 HISTORY — PX: CYSTOSCOPY/URETEROSCOPY/HOLMIUM LASER/STENT PLACEMENT: SHX6546

## 2020-11-11 LAB — MISC LABCORP TEST (SEND OUT): Labcorp test code: 86884

## 2020-11-11 LAB — POCT PREGNANCY, URINE: Preg Test, Ur: NEGATIVE

## 2020-11-11 SURGERY — CYSTOSCOPY/URETEROSCOPY/HOLMIUM LASER/STENT PLACEMENT
Anesthesia: General | Laterality: Left

## 2020-11-11 MED ORDER — APREPITANT 40 MG PO CAPS: 40.0000 mg | ORAL_CAPSULE | Freq: Once | ORAL | Status: AC

## 2020-11-11 MED ORDER — SUCCINYLCHOLINE CHLORIDE 200 MG/10ML IV SOSY
PREFILLED_SYRINGE | INTRAVENOUS | Status: DC | PRN
  Administered 2020-11-11: 120 mg via INTRAVENOUS

## 2020-11-11 MED ORDER — MIDAZOLAM HCL 2 MG/2ML IJ SOLN
INTRAMUSCULAR | Status: AC
  Filled 2020-11-11: qty 2

## 2020-11-11 MED ORDER — ROCURONIUM BROMIDE 100 MG/10ML IV SOLN
INTRAVENOUS | Status: DC | PRN
  Administered 2020-11-11: 40 mg via INTRAVENOUS

## 2020-11-11 MED ORDER — MIDAZOLAM HCL 2 MG/2ML IJ SOLN
INTRAMUSCULAR | Status: DC | PRN
  Administered 2020-11-11: 2 mg via INTRAVENOUS

## 2020-11-11 MED ORDER — FENTANYL CITRATE (PF) 100 MCG/2ML IJ SOLN
INTRAMUSCULAR | Status: AC
  Administered 2020-11-11: 25 ug via INTRAVENOUS
  Filled 2020-11-11: qty 2

## 2020-11-11 MED ORDER — FENTANYL CITRATE (PF) 100 MCG/2ML IJ SOLN
INTRAMUSCULAR | Status: DC | PRN
  Administered 2020-11-11 (×2): 50 ug via INTRAVENOUS

## 2020-11-11 MED ORDER — FAMOTIDINE 20 MG PO TABS
ORAL_TABLET | ORAL | Status: AC
  Administered 2020-11-11: 20 mg via ORAL
  Filled 2020-11-11: qty 1

## 2020-11-11 MED ORDER — CIPROFLOXACIN IN D5W 400 MG/200ML IV SOLN
INTRAVENOUS | Status: AC
  Administered 2020-11-11: 400 mg via INTRAVENOUS
  Filled 2020-11-11: qty 200

## 2020-11-11 MED ORDER — KETOROLAC TROMETHAMINE 15 MG/ML IJ SOLN
INTRAMUSCULAR | Status: DC | PRN
  Administered 2020-11-11: 15 mg via INTRAVENOUS

## 2020-11-11 MED ORDER — ROCURONIUM BROMIDE 10 MG/ML (PF) SYRINGE
PREFILLED_SYRINGE | INTRAVENOUS | Status: AC
  Filled 2020-11-11: qty 10

## 2020-11-11 MED ORDER — APREPITANT 40 MG PO CAPS
ORAL_CAPSULE | ORAL | Status: AC
  Administered 2020-11-11: 40 mg via ORAL
  Filled 2020-11-11: qty 1

## 2020-11-11 MED ORDER — LACTATED RINGERS IV SOLN
INTRAVENOUS | Status: DC
Start: 2020-11-11 — End: 2020-11-11

## 2020-11-11 MED ORDER — ORAL CARE MOUTH RINSE: 15.0000 mL | Freq: Once | OROMUCOSAL | Status: AC

## 2020-11-11 MED ORDER — SCOPOLAMINE 1 MG/3DAYS TD PT72: 1.0000 | MEDICATED_PATCH | TRANSDERMAL | Status: DC

## 2020-11-11 MED ORDER — OXYCODONE HCL 5 MG PO TABS
ORAL_TABLET | ORAL | Status: AC
  Administered 2020-11-11: 5 mg
  Filled 2020-11-11: qty 1

## 2020-11-11 MED ORDER — LIDOCAINE HCL (CARDIAC) PF 100 MG/5ML IV SOSY
PREFILLED_SYRINGE | INTRAVENOUS | Status: DC | PRN
  Administered 2020-11-11: 100 mg via INTRAVENOUS

## 2020-11-11 MED ORDER — MEPERIDINE HCL 25 MG/ML IJ SOLN: 6.2500 mg | INTRAMUSCULAR | Status: DC | PRN

## 2020-11-11 MED ORDER — CHLORHEXIDINE GLUCONATE 0.12 % MT SOLN
OROMUCOSAL | Status: AC
  Administered 2020-11-11: 15 mL via OROMUCOSAL
  Filled 2020-11-11: qty 15

## 2020-11-11 MED ORDER — CIPROFLOXACIN IN D5W 400 MG/200ML IV SOLN
INTRAVENOUS | Status: DC | PRN
  Administered 2020-11-11: 400 mg via INTRAVENOUS

## 2020-11-11 MED ORDER — IOHEXOL 180 MG/ML  SOLN
INTRAMUSCULAR | Status: DC | PRN
  Administered 2020-11-11: 2 mL

## 2020-11-11 MED ORDER — ONDANSETRON HCL 4 MG/2ML IJ SOLN
INTRAMUSCULAR | Status: AC
  Filled 2020-11-11: qty 2

## 2020-11-11 MED ORDER — DEXAMETHASONE SODIUM PHOSPHATE 10 MG/ML IJ SOLN
INTRAMUSCULAR | Status: AC
  Filled 2020-11-11: qty 1

## 2020-11-11 MED ORDER — BELLADONNA ALKALOIDS-OPIUM 16.2-60 MG RE SUPP
RECTAL | Status: AC
  Filled 2020-11-11: qty 1

## 2020-11-11 MED ORDER — DEXAMETHASONE SODIUM PHOSPHATE 10 MG/ML IJ SOLN
INTRAMUSCULAR | Status: DC | PRN
  Administered 2020-11-11: 10 mg via INTRAVENOUS

## 2020-11-11 MED ORDER — CIPROFLOXACIN IN D5W 400 MG/200ML IV SOLN: 400.0000 mg | INTRAVENOUS | Status: AC

## 2020-11-11 MED ORDER — ONDANSETRON HCL 4 MG/2ML IJ SOLN
INTRAMUSCULAR | Status: DC | PRN
  Administered 2020-11-11: 4 mg via INTRAVENOUS

## 2020-11-11 MED ORDER — FAMOTIDINE 20 MG PO TABS: 20.0000 mg | ORAL_TABLET | Freq: Once | ORAL | Status: AC

## 2020-11-11 MED ORDER — SODIUM CHLORIDE 0.9 % IR SOLN
Status: DC | PRN
  Administered 2020-11-11: 1000 mL via INTRAVESICAL

## 2020-11-11 MED ORDER — POTASSIUM CITRATE ER 15 MEQ (1620 MG) PO TBCR: 2.0000 | EXTENDED_RELEASE_TABLET | Freq: Two times a day (BID) | ORAL | 11 refills | Status: DC

## 2020-11-11 MED ORDER — HYDROCODONE-ACETAMINOPHEN 5-325 MG PO TABS: 1.0000 | ORAL_TABLET | Freq: Four times a day (QID) | ORAL | 0 refills | Status: AC | PRN

## 2020-11-11 MED ORDER — SCOPOLAMINE 1 MG/3DAYS TD PT72
MEDICATED_PATCH | TRANSDERMAL | Status: AC
  Administered 2020-11-11: 1.5 mg via TRANSDERMAL
  Filled 2020-11-11: qty 1

## 2020-11-11 MED ORDER — BELLADONNA ALKALOIDS-OPIUM 16.2-60 MG RE SUPP
RECTAL | Status: DC | PRN
  Administered 2020-11-11: 1 via RECTAL

## 2020-11-11 MED ORDER — SUGAMMADEX SODIUM 200 MG/2ML IV SOLN
INTRAVENOUS | Status: DC | PRN
  Administered 2020-11-11: 200 mg via INTRAVENOUS

## 2020-11-11 MED ORDER — CHLORHEXIDINE GLUCONATE 0.12 % MT SOLN: 15.0000 mL | Freq: Once | OROMUCOSAL | Status: AC

## 2020-11-11 MED ORDER — ONDANSETRON HCL 4 MG/2ML IJ SOLN: 4.0000 mg | Freq: Once | INTRAMUSCULAR | Status: DC | PRN

## 2020-11-11 MED ORDER — FENTANYL CITRATE (PF) 100 MCG/2ML IJ SOLN
INTRAMUSCULAR | Status: AC
  Filled 2020-11-11: qty 2

## 2020-11-11 MED ORDER — DEXMEDETOMIDINE (PRECEDEX) IN NS 20 MCG/5ML (4 MCG/ML) IV SYRINGE
PREFILLED_SYRINGE | INTRAVENOUS | Status: DC | PRN
  Administered 2020-11-11 (×2): 8 ug via INTRAVENOUS

## 2020-11-11 MED ORDER — ACETAMINOPHEN 10 MG/ML IV SOLN
INTRAVENOUS | Status: AC
  Filled 2020-11-11: qty 100

## 2020-11-11 MED ORDER — ACETAMINOPHEN 10 MG/ML IV SOLN
INTRAVENOUS | Status: DC | PRN
  Administered 2020-11-11: 1000 mg via INTRAVENOUS

## 2020-11-11 MED ORDER — FENTANYL CITRATE (PF) 100 MCG/2ML IJ SOLN
25.0000 ug | INTRAMUSCULAR | Status: DC | PRN
  Administered 2020-11-11 (×2): 25 ug via INTRAVENOUS

## 2020-11-11 MED ORDER — PROPOFOL 10 MG/ML IV BOLUS
INTRAVENOUS | Status: DC | PRN
  Administered 2020-11-11: 250 mg via INTRAVENOUS

## 2020-11-11 MED ORDER — KETOROLAC TROMETHAMINE 30 MG/ML IJ SOLN
INTRAMUSCULAR | Status: AC
  Filled 2020-11-11: qty 1

## 2020-11-11 SURGICAL SUPPLY — 31 items
BAG DRAIN CYSTO-URO LG1000N (MISCELLANEOUS) ×3 IMPLANT
BRUSH SCRUB EZ 1% IODOPHOR (MISCELLANEOUS) ×3 IMPLANT
CATH URET FLEX-TIP 2 LUMEN 10F (CATHETERS) IMPLANT
CATH URETL OPEN 5X70 (CATHETERS) IMPLANT
CNTNR SPEC 2.5X3XGRAD LEK (MISCELLANEOUS)
CONT SPEC 4OZ STER OR WHT (MISCELLANEOUS)
CONT SPEC 4OZ STRL OR WHT (MISCELLANEOUS)
CONTAINER SPEC 2.5X3XGRAD LEK (MISCELLANEOUS) IMPLANT
DRAPE UTILITY 15X26 TOWEL STRL (DRAPES) ×3 IMPLANT
DRSG TEGADERM 2-3/8X2-3/4 SM (GAUZE/BANDAGES/DRESSINGS) ×2 IMPLANT
GAUZE 4X4 16PLY ~~LOC~~+RFID DBL (SPONGE) ×6 IMPLANT
GLOVE SURG UNDER POLY LF SZ7.5 (GLOVE) ×3 IMPLANT
GOWN STRL REUS W/ TWL LRG LVL3 (GOWN DISPOSABLE) ×2 IMPLANT
GOWN STRL REUS W/ TWL XL LVL3 (GOWN DISPOSABLE) ×2 IMPLANT
GOWN STRL REUS W/TWL LRG LVL3 (GOWN DISPOSABLE) ×3
GOWN STRL REUS W/TWL XL LVL3 (GOWN DISPOSABLE) ×3
GUIDEWIRE STR DUAL SENSOR (WIRE) ×3 IMPLANT
INFUSOR MANOMETER BAG 3000ML (MISCELLANEOUS) ×3 IMPLANT
IV NS IRRIG 3000ML ARTHROMATIC (IV SOLUTION) ×3 IMPLANT
KIT TURNOVER CYSTO (KITS) ×3 IMPLANT
PACK CYSTO AR (MISCELLANEOUS) ×3 IMPLANT
SET CYSTO W/LG BORE CLAMP LF (SET/KITS/TRAYS/PACK) ×3 IMPLANT
SHEATH URETERAL 12FRX35CM (MISCELLANEOUS) IMPLANT
STENT URET 6FRX24 CONTOUR (STENTS) IMPLANT
STENT URET 6FRX26 CONTOUR (STENTS) IMPLANT
SURGILUBE 2OZ TUBE FLIPTOP (MISCELLANEOUS) ×3 IMPLANT
SYR 10ML LL (SYRINGE) ×3 IMPLANT
TRACTIP FLEXIVA PULSE ID 200 (Laser) IMPLANT
VALVE UROSEAL ADJ ENDO (VALVE) IMPLANT
WATER STERILE IRR 1000ML POUR (IV SOLUTION) ×3 IMPLANT
WATER STERILE IRR 500ML POUR (IV SOLUTION) ×3 IMPLANT

## 2020-11-11 NOTE — Transfer of Care (Signed)
Immediate Anesthesia Transfer of Care Note  Patient: Kayla Sexton  Procedure(s) Performed: CYSTOSCOPY/URETEROSCOPY/HOLMIUM LASER (Left) CYSTOSCOPY WITH RETROGRADE PYELOGRAM  Patient Location: PACU  Anesthesia Type:General  Level of Consciousness: awake  Airway & Oxygen Therapy: Patient Spontanous Breathing  Post-op Assessment: Report given to RN  Post vital signs: stable  Last Vitals:  Vitals Value Taken Time  BP 123/97 11/11/20 1230  Temp    Pulse 85 11/11/20 1233  Resp 14 11/11/20 1233  SpO2 91 % 11/11/20 1233  Vitals shown include unvalidated device data.  Last Pain:  Vitals:   11/11/20 1025  TempSrc: Temporal  PainSc: 4       Patients Stated Pain Goal: 0 (49/75/30 0511)  Complications: No notable events documented.

## 2020-11-11 NOTE — Discharge Instructions (Signed)

## 2020-11-11 NOTE — Anesthesia Procedure Notes (Signed)
Procedure Name: Intubation Date/Time: 11/11/2020 11:38 AM Performed by: Tollie Eth, CRNA Pre-anesthesia Checklist: Patient identified, Patient being monitored, Timeout performed, Emergency Drugs available and Suction available Patient Re-evaluated:Patient Re-evaluated prior to induction Oxygen Delivery Method: Circle system utilized Preoxygenation: Pre-oxygenation with 100% oxygen Induction Type: IV induction Ventilation: Mask ventilation without difficulty Laryngoscope Size: 3 and McGraph Grade View: Grade I Tube type: Oral Tube size: 6.5 mm Number of attempts: 1 Airway Equipment and Method: Stylet Placement Confirmation: ETT inserted through vocal cords under direct vision, positive ETCO2 and breath sounds checked- equal and bilateral Secured at: 21 cm Tube secured with: Tape Dental Injury: Teeth and Oropharynx as per pre-operative assessment

## 2020-11-11 NOTE — Anesthesia Postprocedure Evaluation (Signed)
Anesthesia Post Note  Patient: Kayla Sexton  Procedure(s) Performed: CYSTOSCOPY/URETEROSCOPY/HOLMIUM LASER (Left) CYSTOSCOPY WITH RETROGRADE PYELOGRAM  Patient location during evaluation: PACU Anesthesia Type: General Level of consciousness: awake and alert, awake and oriented Pain management: pain level controlled Vital Signs Assessment: post-procedure vital signs reviewed and stable Respiratory status: spontaneous breathing, nonlabored ventilation and respiratory function stable Cardiovascular status: blood pressure returned to baseline and stable Postop Assessment: no apparent nausea or vomiting Anesthetic complications: no   No notable events documented.   Last Vitals:  Vitals:   11/11/20 1322 11/11/20 1341  BP: 112/77 110/81  Pulse: 77 76  Resp: 11 16  Temp:  36.7 C  SpO2: 95% 98%    Last Pain:  Vitals:   11/11/20 1341  TempSrc: Temporal  PainSc: 4                  Phill Mutter

## 2020-11-11 NOTE — Op Note (Signed)
Date of procedure: 11/11/20  Preoperative diagnosis:  Left renal stones  Postoperative diagnosis:  Same  Procedure: Cystoscopy, left ureteroscopy, laser lithotripsy, left retrograde pyelogram with intraoperative interpretation  Surgeon: Nickolas Madrid, MD  Anesthesia: General  Complications: None  Intraoperative findings:  Normal cystoscopy, uncomplicated dusting of multiple left renal stones, appeared uric acid Excellent drainage on retrograde, and no stent placed  EBL: Minimal  Specimens: None  Drains: None  Indication: Kayla Sexton is a 40 y.o. patient with recurrent uric acid stones who presented with multiple left renal stones causing intermittent renal colic.  After reviewing the management options for treatment, they elected to proceed with the above surgical procedure(s). We have discussed the potential benefits and risks of the procedure, side effects of the proposed treatment, the likelihood of the patient achieving the goals of the procedure, and any potential problems that might occur during the procedure or recuperation. Informed consent has been obtained.  Description of procedure:  The patient was taken to the operating room and general anesthesia was induced. SCDs were placed for DVT prophylaxis. The patient was placed in the dorsal lithotomy position, prepped and draped in the usual sterile fashion, and preoperative antibiotics were administered. A preoperative time-out was performed.   44 French rigid cystoscope was used to intubate the urethra and thorough cystoscopy was performed.  The bladder was grossly normal, and the ureteral orifices orthotopic bilaterally.  A sensor wire passed easily into the left kidney under fluoroscopic vision.  The single channel digital flexible ureteroscope advanced easily over the wire up into the kidney.  Thorough pyeloscopy revealed 3 yellow stones that appeared uric acid, the largest measuring 1 cm in the renal pelvis. Two stones  were in the renal pelvis, and the third was in the lower pole.  The 200 m laser fiber on settings of 0.3 J and 80 Hz was used to methodically dust all the stones.  Thorough pyeloscopy revealed no residual significant stone fragments.  A retrograde pyelogram was performed from the proximal ureter and showed no extravasation or filling defects.  Careful pullback ureteroscopy showed no abnormalities.  Fluoroscopy a few minutes later showed drainage of the majority of the contrast from the left kidney, and on cystoscopy brisk efflux was seen from the left ureteral orifice.  A stent was not placed with her history of not tolerating stents, and excellent drainage both under direct vision and on fluoroscopy.  Disposition: Stable to PACU  Plan: Started potassium citrate 30 mEq twice daily Follow-up in clinic in 3 months with urinalysis for pH monitoring  Nickolas Madrid, MD

## 2020-11-11 NOTE — H&P (Signed)
11/11/20 10:42 AM   Kayla Sexton [birth year] [id number]  CC: left renal stones  HPI: 40 year old female with extensive history of uric acid nephrolithiasis requiring ureteroscopy x3 in the past, most recently on the left side in December 2021 for a 1 cm renal pelvis stone.  She reports about 3 weeks of left-sided flank pain and abdominal pain that feels similar to prior stone episodes.  She denies any fevers or chills, some mild urinary frequency but no dysuria or other urinary symptoms.  Urine culture negative. CT showed multiple left renal stones, largest 1cm, likely causing intermittent left renal colic. Has not tolerated stents will in the past.  Has been prescribed k+ citrate previously, not currently taking.     PMH: Past Medical History:  Diagnosis Date   Anemia    Cancer ([place]) 2006   CERVICAL    CIN III (cervical intraepithelial neoplasia III)    Dysuria    Family history of adverse reaction to anesthesia    DAD AND BROTHER AND MOM-N/V   GERD (gastroesophageal reflux disease)    OCC   Hematuria    HH (hiatus hernia)    History of cervical dysplasia 2006   CIN1 and CIN2   History of kidney stones    Migraines    PCOS (polycystic ovarian syndrome)    Dr. Helane Rima   PONV (postoperative nausea and vomiting)    HARD TO WAKE UP   Right ureteral calculus    Sleep apnea    DOES NOT USE CPAP    Surgical History: Past Surgical History:  Procedure Laterality Date   CERVICAL BIOPSY  W/ LOOP ELECTRODE EXCISION  12-04-2004   dr Helane Rima  [facility]   COLONOSCOPY  03/2008   Dr. Michail Sermon   COMBINED HYSTEROSCOPY DIAGNOSTIC / D&C  02-28-2012   dr Helane Rima   @ [facility]   CYSTOSCOPY W/ RETROGRADES Right 03/31/2018   Procedure: CYSTOSCOPY WITH RETROGRADE PYELOGRAM;  Surgeon: Billey Co, MD;  Location: [facility];  Service: Urology;  Laterality: Right;   CYSTOSCOPY WITH RETROGRADE PYELOGRAM, URETEROSCOPY AND STENT PLACEMENT Bilateral 07/21/2017   Procedure: CYSTOSCOPY WITH BILATERAL   RETROGRADE BILATERAL URETEROSCOPY AND STENTS  PLACEMENT;  Surgeon: Irine Seal, MD;  Location: [facility];  Service: Urology;  Laterality: Bilateral;   CYSTOSCOPY WITH RETROGRADE URETHROGRAM Left 01/04/2020   Procedure: CYSTOSCOPY WITH RETROGRADE URETHROGRAM;  Surgeon: Billey Co, MD;  Location: [facility];  Service: Urology;  Laterality: Left;   CYSTOSCOPY/URETEROSCOPY/HOLMIUM LASER/STENT PLACEMENT Left 03/17/2018   Procedure: CYSTOSCOPY/URETEROSCOPY/HOLMIUM LASER/STENT PLACEMENT;  Surgeon: Billey Co, MD;  Location: [facility];  Service: Urology;  Laterality: Left;   CYSTOSCOPY/URETEROSCOPY/HOLMIUM LASER/STENT PLACEMENT Right 03/31/2018   Procedure: CYSTOSCOPY/URETEROSCOPY/HOLMIUM LASER/STENT PLACEMENT;  Surgeon: Billey Co, MD;  Location: [facility];  Service: Urology;  Laterality: Right;   CYSTOSCOPY/URETEROSCOPY/HOLMIUM LASER/STENT PLACEMENT Left 01/04/2020   Procedure: CYSTOSCOPY/URETEROSCOPY/HOLMIUM LASER;  Surgeon: Billey Co, MD;  Location: [facility];  Service: Urology;  Laterality: Left;   endoscopy  2010   Dr. Amedeo Plenty   FINGER SURGERY Right    MIDDLE FINGER   FOOT SURGERY Left 2015   5th bunionectomy w/ 2nd digit w/ fusion , retained hardware w/ 5th toe   HOLMIUM LASER APPLICATION Bilateral 07/15/3084   Procedure: HOLMIUM LASER APPLICATION;  Surgeon: Irine Seal, MD;  Location: [facility];  Service: Urology;  Laterality: Bilateral;    Family History: Family History  Problem Relation Age of Onset   Healthy Mother    Healthy Father  Breast cancer Paternal Grandmother    Breast cancer Other        Maternal Great Aunt    Social History:  reports that she has never smoked. She has never used smokeless tobacco. She reports that she does not drink alcohol and does not use drugs.  Physical Exam: BP (!) 135/97   Pulse 96   Temp (!) 97.1 F (36.2 C) (Temporal)   Resp 16   Ht 5\' 2"  (1.575 m)   Wt 88 kg   SpO2 96%   BMI 35.48 kg/m     Constitutional:  Alert and oriented, No acute distress. Cardiovascular: RRR Respiratory: CTA bilaterally GI: Abdomen is soft, nontender, nondistended, no abdominal masses  Laboratory Data: Urine culture negative  Assessment & Plan:   40 yo F with recurrent uric acid stones, now with left renal colic secondary to ~6SA left renal stone causing intermittent obstruction.  We specifically discussed the risks ureteroscopy including bleeding, infection/sepsis, stent related symptoms including flank pain/urgency/frequency/incontinence/dysuria, ureteral injury, inability to access stone, or need for staged or additional procedures.  Left ureteroscopy, laser lithotripsy, possible stent placement Will need to resume K+ citrate for stone prevention  Nickolas Madrid, MD 11/11/2020  [place] [address], [place], [place] [address] [phone]

## 2020-11-11 NOTE — Anesthesia Preprocedure Evaluation (Signed)
Anesthesia Evaluation  Patient identified by MRN, date of birth, ID band Patient awake    Reviewed: Allergy & Precautions, NPO status , Patient's Chart, lab work & pertinent test results, reviewed documented beta blocker date and time   History of Anesthesia Complications (+) PONV, Family history of anesthesia reaction and history of anesthetic complications  Airway Mallampati: III  TM Distance: >3 FB     Dental  (+) Chipped   Pulmonary sleep apnea (Waiting on CPAP) ,    Pulmonary exam normal        Cardiovascular Normal cardiovascular exam     Neuro/Psych  Headaches, PSYCHIATRIC DISORDERS Anxiety  Neuromuscular disease    GI/Hepatic hiatal hernia, GERD  Controlled,  Endo/Other  Morbid obesity  Renal/GU Renal disease     Musculoskeletal   Abdominal   Peds  Hematology  (+) anemia ,   Anesthesia Other Findings Anemia    Cancer ([place]) 2006 CERVICAL   CIN III (cervical intraepithelial neoplasia III)    Dysuria    Family history of adverse reaction to anesthesia  DAD AND BROTHER AND MOM-N/V  GERD (gastroesophageal reflux disease)  OCC  Hematuria    HH (hiatus hernia)    History of kidney stones   Migraines    PCOS PONV HARD TO WAKE UP  Right ureteral calculus   Sleep apnea       Reproductive/Obstetrics                             Anesthesia Physical  Anesthesia Plan  ASA: 3  Anesthesia Plan: General   Post-op Pain Management:    Induction: Intravenous  PONV Risk Score and Plan: 4 or greater and Scopolamine patch - Pre-op, Midazolam and Ondansetron  Airway Management Planned: Oral ETT  Additional Equipment:   Intra-op Plan:   Post-operative Plan: Extubation in OR  Informed Consent: I have reviewed the patients History and Physical, chart, labs and discussed the procedure including the risks, benefits and alternatives for the proposed anesthesia with the patient or  authorized representative who has indicated his/her understanding and acceptance.       Plan Discussed with: CRNA, Anesthesiologist and Surgeon  Anesthesia Plan Comments:         Anesthesia Quick Evaluation

## 2020-11-12 ENCOUNTER — Encounter: Payer: Self-pay | Admitting: Urology

## 2020-11-12 ENCOUNTER — Telehealth: Payer: Self-pay

## 2020-11-12 DIAGNOSIS — Z2239 Carrier of other specified bacterial diseases: Secondary | ICD-10-CM

## 2020-11-12 DIAGNOSIS — A493 Mycoplasma infection, unspecified site: Secondary | ICD-10-CM

## 2020-11-12 MED ORDER — DOXYCYCLINE HYCLATE 100 MG PO CAPS: 100.0000 mg | ORAL_CAPSULE | Freq: Two times a day (BID) | ORAL | 0 refills | Status: AC

## 2020-11-12 NOTE — Telephone Encounter (Signed)
-----   Message from Billey Co, MD sent at 11/12/2020  8:22 AM EDT ----- Her urine culture did ultimately grow an atypical bacteria Ureaplasma, recommend doxycycline 100 mg twice daily x7 days, thanks  Nickolas Madrid, MD 11/12/2020

## 2020-11-12 NOTE — Telephone Encounter (Signed)
See mychart.  

## 2020-11-20 ENCOUNTER — Other Ambulatory Visit: Payer: Self-pay | Admitting: Obstetrics and Gynecology

## 2020-12-10 ENCOUNTER — Ambulatory Visit: Payer: 59 | Admitting: Urology

## 2021-02-12 ENCOUNTER — Ambulatory Visit: Payer: 59 | Admitting: Urology

## 2021-08-09 ENCOUNTER — Telehealth: Payer: Commercial Managed Care - PPO | Admitting: Family

## 2021-08-09 ENCOUNTER — Telehealth: Payer: Self-pay | Admitting: Family

## 2021-08-09 DIAGNOSIS — H9203 Otalgia, bilateral: Secondary | ICD-10-CM | POA: Diagnosis not present

## 2021-08-09 DIAGNOSIS — J209 Acute bronchitis, unspecified: Secondary | ICD-10-CM | POA: Diagnosis not present

## 2021-08-09 DIAGNOSIS — J069 Acute upper respiratory infection, unspecified: Secondary | ICD-10-CM

## 2021-08-09 MED ORDER — FLUTICASONE PROPIONATE 50 MCG/ACT NA SUSP: 2.0000 | Freq: Every day | NASAL | 6 refills | Status: DC

## 2021-08-09 MED ORDER — PREDNISONE 10 MG (21) PO TBPK: ORAL_TABLET | ORAL | 0 refills | Status: DC

## 2021-08-09 MED ORDER — BENZONATATE 100 MG PO CAPS: 100.0000 mg | ORAL_CAPSULE | Freq: Three times a day (TID) | ORAL | 0 refills | Status: DC | PRN

## 2021-08-09 MED ORDER — AMOXICILLIN-POT CLAVULANATE 875-125 MG PO TABS: 1.0000 | ORAL_TABLET | Freq: Two times a day (BID) | ORAL | 0 refills | Status: DC
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
## 2021-08-09 NOTE — Progress Notes (Signed)

## 2021-08-25 HISTORY — PX: EXTRACORPOREAL SHOCK WAVE LITHOTRIPSY: SHX1557

## 2021-08-28 ENCOUNTER — Telehealth: Payer: Commercial Managed Care - PPO | Admitting: Nurse Practitioner

## 2021-08-28 DIAGNOSIS — J209 Acute bronchitis, unspecified: Secondary | ICD-10-CM | POA: Diagnosis not present

## 2021-08-29 MED ORDER — PREDNISONE 20 MG PO TABS: 40.0000 mg | ORAL_TABLET | Freq: Every day | ORAL | 0 refills | Status: AC

## 2021-08-29 MED ORDER — FLUTICASONE-SALMETEROL 100-50 MCG/ACT IN AEPB: 1.0000 | INHALATION_SPRAY | Freq: Two times a day (BID) | RESPIRATORY_TRACT | 3 refills | Status: DC

## 2021-08-29 NOTE — Progress Notes (Signed)
We are sorry that you are not feeling well.  Here is how we plan to help!  Based on your presentation you need to complete antibiotics given. I will call in advair 1 puff daily BID as well as prdenisone '20mg'$  2 at the same time daily for 5 days.   In addition you may use A non-prescription cough medication called Mucinex DM: take 2 tablets every 12 hours.   From your responses in the eVisit questionnaire you describe inflammation in the upper respiratory tract which is causing a significant cough.  This is commonly called Bronchitis and has four common causes:   Allergies Viral Infections Acid Reflux Bacterial Infection Allergies, viruses and acid reflux are treated by controlling symptoms or eliminating the cause. An example might be a cough caused by taking certain blood pressure medications. You stop the cough by changing the medication. Another example might be a cough caused by acid reflux. Controlling the reflux helps control the cough.  USE OF BRONCHODILATOR ("RESCUE") INHALERS: There is a risk from using your bronchodilator too frequently.  The risk is that over-reliance on a medication which only relaxes the muscles surrounding the breathing tubes can reduce the effectiveness of medications prescribed to reduce swelling and congestion of the tubes themselves.  Although you feel brief relief from the bronchodilator inhaler, your asthma may actually be worsening with the tubes becoming more swollen and filled with mucus.  This can delay other crucial treatments, such as oral steroid medications. If you need to use a bronchodilator inhaler daily, several times per day, you should discuss this with your provider.  There are probably better treatments that could be used to keep your asthma under control.     HOME CARE Only take medications as instructed by your medical team. Complete the entire course of an antibiotic. Drink plenty of fluids and get plenty of rest. Avoid close contacts  especially the very young and the elderly Cover your mouth if you cough or cough into your sleeve. Always remember to wash your hands A steam or ultrasonic humidifier can help congestion.   GET HELP RIGHT AWAY IF: You develop worsening fever. You become short of breath You cough up blood. Your symptoms persist after you have completed your treatment plan MAKE SURE YOU  Understand these instructions. Will watch your condition. Will get help right away if you are not doing well or get worse.    Thank you for choosing an e-visit.  Your e-visit answers were reviewed by a board certified advanced clinical practitioner to complete your personal care plan. Depending upon the condition, your plan could have included both over the counter or prescription medications.  Please review your pharmacy choice. Make sure the pharmacy is open so you can pick up prescription now. If there is a problem, you may contact your provider through [employer] and have the prescription routed to another pharmacy.  Your safety is important to [place]. If you have drug allergies check your prescription carefully.   For the next 24 hours you can use MyChart to ask questions about today's visit, request a non-urgent call back, or ask for a work or school excuse. You will get an email in the next two days asking about your experience. I hope that your e-visit has been valuable and will speed your recovery.  5-10 minutes spent reviewing and documenting in chart.

## 2021-09-09 ENCOUNTER — Ambulatory Visit (INDEPENDENT_AMBULATORY_CARE_PROVIDER_SITE_OTHER): Payer: Commercial Managed Care - PPO

## 2021-09-09 ENCOUNTER — Encounter: Payer: Self-pay | Admitting: Nurse Practitioner

## 2021-09-09 ENCOUNTER — Ambulatory Visit (INDEPENDENT_AMBULATORY_CARE_PROVIDER_SITE_OTHER): Payer: Commercial Managed Care - PPO | Admitting: Nurse Practitioner

## 2021-09-09 VITALS — BP 132/88 | HR 103 | Temp 98.5°F | Ht 62.0 in | Wt 192.6 lb

## 2021-09-09 DIAGNOSIS — R0602 Shortness of breath: Secondary | ICD-10-CM | POA: Diagnosis not present

## 2021-09-09 DIAGNOSIS — R059 Cough, unspecified: Secondary | ICD-10-CM

## 2021-09-09 DIAGNOSIS — J309 Allergic rhinitis, unspecified: Secondary | ICD-10-CM | POA: Insufficient documentation

## 2021-09-09 DIAGNOSIS — J4540 Moderate persistent asthma, uncomplicated: Secondary | ICD-10-CM

## 2021-09-09 DIAGNOSIS — G4733 Obstructive sleep apnea (adult) (pediatric): Secondary | ICD-10-CM | POA: Diagnosis not present

## 2021-09-09 DIAGNOSIS — E669 Obesity, unspecified: Secondary | ICD-10-CM

## 2021-09-09 DIAGNOSIS — J3089 Other allergic rhinitis: Secondary | ICD-10-CM

## 2021-09-09 LAB — POCT EXHALED NITRIC OXIDE: FeNO level (ppb): 19

## 2021-09-09 MED ORDER — FLUTICASONE-SALMETEROL 230-21 MCG/ACT IN AERO: 2.0000 | INHALATION_SPRAY | Freq: Two times a day (BID) | RESPIRATORY_TRACT | 5 refills | Status: DC

## 2021-09-09 MED ORDER — ALBUTEROL SULFATE HFA 108 (90 BASE) MCG/ACT IN AERS: 2.0000 | INHALATION_SPRAY | Freq: Four times a day (QID) | RESPIRATORY_TRACT | 2 refills | Status: DC | PRN

## 2021-09-09 MED ORDER — FLUTICASONE PROPIONATE 50 MCG/ACT NA SUSP: 2.0000 | Freq: Every day | NASAL | 2 refills | Status: DC

## 2021-09-09 NOTE — Progress Notes (Signed)
'@Patient'  ID: Kayla Sexton, female    DOB: 10-03-1980, 41 y.o.   MRN: [id number]  Chief Complaint  Patient presents with   Follow-up    Follow up on OSA. Patient wants to talk about other treatment options. Possibly inspire.     Referring provider: Lesleigh Noe, MD  HPI: 41 year old female, never smoker followed for severe OSA.  She is a patient of Dr. Juanetta Gosling and last seen in office 06/09/2020 by Volanda Napoleon NP.  Past medical history significant for migraines, GERD, PCOS, ADD, obesity, recurrent bronchitis.  TEST/EVENTS:  06/02/2020 HST: AHI 33.3, SPO2 low 84% 08/27/2021 CXR 2 view outside results: No visible image; read negative chest  06/09/2020: We will visit with Volanda Napoleon NP to review home sleep study results.  HST showed severe obstructive sleep apnea with AHI 33.3.  Has significant daytime fatigue and exhaustion.  Feels as though she ran a marathon every day.  Can barely keep her eyes open.  Has previously had a bad experience with CPAP due to a mishap with the humidification.  She is willing to try CPAP therapy again; if unable to tolerate would consider inspire device.  Orders placed for CPAP auto 5-15, mask of choice.  Follow-up 3 months.  09/09/2021: Today-follow-up Patient presents today for follow-up.  She has not been seen in a little over a year.  She was prescribed CPAP at previous visit; however, never received this.  Upon review of her chart, it looks as though [facility] was contacted in August 2022 and notified us that CPAP was on backorder.  They were supposed to call her when they got one available.  She reports that she never heard back from them and has still not gotten any machine.  She is understandably very frustrated about this.  She has significant daytime fatigue symptoms and struggles on a daily basis to keep her eyes open, especially in the evenings.  She wakes in the morning feeling like she did not rest at all.  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   She has also had trouble in the past with transporting the CPAP machine to different countries.  They seem to give her a hard time, especially foreign countries.  She is also only allowed to carry 3 bags and she is gone for multiple weeks at a time.  At this point, she is hesitant to even try CPAP and thinks that she would like to move forward with inspire device.  She denies any morning headaches or drowsy driving.  She is currently working on weight loss with her OB/GYN.  Has PCOS. She was previously on a weight loss medication that was working well for her but insurance no longer covers it so she had to stop.  She has also been dealing with a persistent bout of bronchitis recently.  She was initially treated in mid July by telehealth provider with prednisone and Augmentin.  She then went to an emergency department in [place] on 8/2 for ear pain and persistent cough.  She was treated with [facility] for otitis and provided with Ladona Ridgel.  She was also given a prescription for albuterol rescue inhaler.  CXR did not show any evidence of infection.  She continued to have trouble with her cough and had another telehealth visit at which point she was prescribed prednisone burst and Advair.  D-dimer was negative.  Today, she reports that her symptoms are somewhat better.  She is still coughing more than normal  and has some chest congestion.  Cough is usually nonproductive, but occasionally she will get up some clear to white mucus.  She has year-round allergy type symptoms with nasal congestion and clear drainage.  Occasionally with some sneezing.  She has a history of getting bronchitis around 2-3 times a year for the last 5 years or so.  She has had longstanding issues with dyspnea upon exertion, especially with working outside or when it is hot outside.  She also notices some occasional wheezing.  No formal diagnosis of asthma.  She does feel like the Advair has helped but still not entirely back to her  baseline.  Denies any fevers, chills, night sweats, anorexia, weight loss, hemoptysis, calf pain, lower extremity swelling, orthopnea, palpitations.  Allergies  Allergen Reactions   Adhesive [Tape] Swelling    Per pt only the 12 lead ekg pads cause "welps and abrasions"-ALSO SOME BANDAIDS CAUSE IRRITATION-OK TO USE PAPER TAPE   Other     RED MEAT-ANAPHYLAXIS  General Anesthesia - nausea and vomiting (scopolamine patch doesn't work)   Vancomycin Hives and Itching   Bactrim [Sulfamethoxazole-Trimethoprim] Rash    Immunization History  Administered Date(s) Administered   DTaP 04/30/2019   HPV Quadrivalent 12/24/2004, 02/23/2005, 06/02/2005   Influenza,inj,Quad PF,6+ Mos 10/26/2012, 11/05/2019   Influenza-Unspecified 12/06/2017   PFIZER(Purple Top)SARS-COV-2 Vaccination 03/02/2019, 06/15/2019    Past Medical History:  Diagnosis Date   Anemia    Cancer ([place]) 2006   CERVICAL    CIN III (cervical intraepithelial neoplasia III)    Dysuria    Family history of adverse reaction to anesthesia    DAD AND BROTHER AND MOM-N/V   GERD (gastroesophageal reflux disease)    OCC   Hematuria    HH (hiatus hernia)    History of cervical dysplasia 2006   CIN1 and CIN2   History of kidney stones    Migraines    PCOS (polycystic ovarian syndrome)    Dr. Helane Rima   PONV (postoperative nausea and vomiting)    HARD TO WAKE UP   Right ureteral calculus    Sleep apnea    DOES NOT USE CPAP    Tobacco History: Social History   Tobacco Use  Smoking Status Never  Smokeless Tobacco Never   Counseling given: Not Answered   Outpatient Medications Prior to Visit  Medication Sig Dispense Refill   aspirin-acetaminophen-caffeine (EXCEDRIN MIGRAINE) 250-250-65 MG tablet Take 2 tablets by mouth every 6 (six) hours as needed for headache.     ibuprofen (ADVIL,MOTRIN) 200 MG tablet Take 400 mg by mouth every 6 (six) hours as needed for mild pain or moderate pain.      naproxen sodium (ALEVE) 220 MG  tablet Take 440 mg by mouth 2 (two) times daily as needed (pain).     Potassium Citrate 15 MEQ (1620 MG) TBCR Take 2 tablets by mouth in the morning and at bedtime. 120 tablet 11   fluticasone-salmeterol (ADVAIR) 100-50 MCG/ACT AEPB Inhale 1 puff into the lungs 2 (two) times daily. 1 each 3   amoxicillin-clavulanate (AUGMENTIN) 875-125 MG tablet Take 1 tablet by mouth 2 (two) times daily. 14 tablet 0   benzonatate (TESSALON PERLES) 100 MG capsule Take 1 capsule (100 mg total) by mouth 3 (three) times daily as needed. 20 capsule 0   fluticasone (FLONASE) 50 MCG/ACT nasal spray Place 2 sprays into both nostrils daily. 16 g 6   No facility-administered medications prior to visit.     Review of Systems: As in HPI  otherwise negative    Physical Exam:  BP 132/88 (BP Location: Right Arm, Patient Position: Sitting, Cuff Size: Normal)   Pulse (!) 103   Temp 98.5 F (36.9 C) (Oral)   Ht '5\' 2"'  (1.575 m)   Wt 192 lb 9.6 oz (87.4 kg)   SpO2 98%   BMI 35.23 kg/m   GEN: Pleasant, interactive, well-appearing; obese; in no acute distress. HEENT:  Normocephalic and atraumatic. EACs patent bilaterally. TM pearly gray with present light reflex bilaterally. PERRLA. Sclera white. Nasal turbinates erythematous, moist and patent bilaterally.  Clear rhinorrhea present. Oropharynx erythematous and moist, without exudate or edema. No lesions, ulcerations NECK:  Supple w/ fair ROM. No JVD present. Normal carotid impulses w/o bruits. Thyroid symmetrical with no goiter or nodules palpated. No lymphadenopathy.   CV: RRR, no m/r/g, no peripheral edema. Pulses intact, +2 bilaterally. No cyanosis, pallor or clubbing. PULMONARY:  Unlabored, regular breathing. Clear bilaterally A&P w/o wheezes/rales/rhonchi. No accessory muscle use. No dullness to percussion. GI: BS present and normoactive. Soft, non-tender to palpation. No organomegaly or masses detected. No CVA tenderness. MSK: No erythema, warmth or tenderness. Cap  refil <2 sec all extrem. No deformities or joint swelling noted.  Neuro: A/Ox3. No focal deficits noted.   Skin: Warm, no lesions or rashe Psych: Normal affect and behavior. Judgement and thought content appropriate.     Lab Results:  CBC    Component Value Date/Time   WBC 9.0 01/11/2020 0930   RBC 5.26 (H) 01/11/2020 0930   HGB 14.8 01/11/2020 0930   HGB 14.2 03/08/2018 1654   HCT 46.2 (H) 01/11/2020 0930   HCT 42.6 03/08/2018 1654   PLT 376 01/11/2020 0930   PLT 368 03/08/2018 1654   MCV 87.8 01/11/2020 0930   MCV 82 03/08/2018 1654   MCH 28.1 01/11/2020 0930   MCHC 32.0 01/11/2020 0930   RDW 13.1 01/11/2020 0930   RDW 13.4 03/08/2018 1654   LYMPHSABS 3.5 12/07/2019 0949   LYMPHSABS 4.6 (H) 03/08/2018 1654   MONOABS 0.7 12/07/2019 0949   EOSABS 0.3 12/07/2019 0949   EOSABS 0.2 03/08/2018 1654   BASOSABS 0.0 12/07/2019 0949   BASOSABS 0.1 03/08/2018 1654    BMET    Component Value Date/Time   NA 138 01/11/2020 0930   NA 140 01/08/2020 1547   K 4.4 01/11/2020 0930   CL 104 01/11/2020 0930   CO2 24 01/11/2020 0930   GLUCOSE 88 01/11/2020 0930   BUN 10 01/11/2020 0930   BUN 11 01/08/2020 1547   CREATININE 0.81 01/11/2020 0930   CREATININE 0.74 08/24/2016 1534   CALCIUM 8.6 (L) 01/11/2020 0930   GFRNONAA >60 01/11/2020 0930   GFRAA 114 01/08/2020 1547    BNP No results found for: "BNP"   Imaging:  No results found.        No data to display          No results found for: "NITRICOXIDE"      Assessment & Plan:   OSA (obstructive sleep apnea) Severe OSA with significant daytime symptoms, on CPAP in the past.  Did not have a good experience with this.  She is understandably frustrated that orders for new CPAP were sent over a year ago and she has still not received the machine.  She has been unable to get in touch with [facility].  We had a very lengthy discussion about treatment options.  Discussed that she may not be a candidate for inspire with  her current BMI; however,  she is working on weight loss measures so we will send referral to ENT for further evaluation.  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  Discussed that I was not sure if insurance would cover this.  She is willing to pay for it out-of-pocket and has HSA funds available as well.  She was also told in the past that she had to wear a full facemask; I believe she would be a candidate for a nasal pillow mask.  Provided her with a prescription for air mini auto 5-15 with small nasal mask.  Advised her to notify if she has any trouble getting the device or needs anything else from [place].  Patient Instructions  Continue Tessalon perles 1 capsule Three times a day as needed for cough -Step up Advair to 230 mcg 2 puffs Twice daily. Brush tongue and rinse mouth afterwards -Albuterol inhaler 2 puffs every 6 hours as needed for shortness of breath or wheezing. This is your rescue inhaler to carry with you for breakthrough symptoms. -Flonase nasal spray 2 sprays each nostril daily -Delsym over the counter 2 tsp Twice daily  -Over the counter allergy medication such as Claritin, Zyrtec, Xyzal or Allegra. Take daily.   Chest x ray today Pulmonary function testing scheduled   Start CPAP therapy once you receive machine. Use CPAP every night, minimum of 4-6 hours a night.  Change equipment every 30 days or as directed by DME. Wash your tubing with warm soap and water daily, hang to dry. Wash humidifier portion weekly.   Prescription provided to you today - AirMini Portable link https://www.resmed.com/en-us/sleep-apnea/cpap-parts-support/sleep-apnea-full-products-list/cpap-machines/airmini-portable-cpap/ You can also google search to see if there is anywhere with alternate pricing. HSA or FSA eligible. This link does include the starter kit with mask and  tubing so take that into account when looking.   We discussed how untreated sleep apnea puts an individual at risk for cardiac arrhthymias, pulm HTN, DM, stroke and increases their risk for daytime accidents. We also briefly reviewed treatment options including weight loss, side sleeping position, oral appliance, CPAP therapy or referral to ENT for possible surgical options  Referral to ENT placed today for possible Inspire. You can contact them to set up appointment if you wish to move forward with this.   Follow up in 2 weeks with Dr. Halford Chessman or Alanson Aly. [place] for virtual visit with Shanitha Twining,NP. If symptoms do not improve or worsen, please contact office for sooner follow up or seek emergency care.     Asthma, moderate persistent, poorly-controlled Her history of recurrent bronchitis, reactive dyspnea, wheezing, and allergy type symptoms leading to believe that she may have an asthmatic component to her symptoms.  She also has an elevated eosinophil count to 400 on most recent CBC.  She did have good response to Advair and steroids.  No evidence of bronchospasm on exam today and FeNO was normal at 19 ppb so advised that we could hold off on any further steroids.  Spirometry obtained which showed moderate to severe obstruction.  She is a never smoker, which further leans towards an asthma diagnosis.  Recommended that we step up her up to high-dose Advair given she is still having some increase in pain from baseline.  We will check a CXR today to rule out superimposed infection.  Formal PFTs ordered for further evaluation  Allergic rhinitis She has year-round allergy type symptoms.  She is not currently on any over-the-counter antihistamines.  She was instructed to start Flonase during one of her most recent visits; however, this was not sent as an prescription and she was not aware that it was an over-the-counter medicine.  Do suspect that there is an upper airway component to her cough as well.  We  will start her on intranasal steroid with Flonase for better postnasal drainage control.  Also recommended that she start over-the-counter antihistamine.  May need allergy testing in the future if she remains poorly controlled.  Obesity (BMI 30-39.9) Healthy weight loss discussed today.  She is working with her OB/GYN on weight loss measures given her history of PCOS.   I spent 55 minutes of dedicated to the care of this patient on the date of this encounter to include pre-visit review of records, face-to-face time with the patient discussing conditions above, post visit ordering of testing, clinical documentation with the electronic health record, making appropriate referrals as documented, and communicating necessary findings to members of the patients care team.  Clayton Bibles, NP 09/09/2021  Pt aware and understands NP's role.

## 2021-09-09 NOTE — Assessment & Plan Note (Addendum)
Her history of recurrent bronchitis, reactive dyspnea, wheezing, and allergy type symptoms leading to believe that she may have an asthmatic component to her symptoms.  She also has an elevated eosinophil count to 400 on most recent CBC.  She did have good response to Advair and steroids.  No evidence of bronchospasm on exam today and FeNO was normal at 19 ppb so advised that we could hold off on any further steroids.  Spirometry obtained which showed moderate to severe obstruction.  She is a never smoker, which further leans towards an asthma diagnosis.  Recommended that we step up her up to high-dose Advair given she is still having some increase in pain from baseline.  We will check a CXR today to rule out superimposed infection.  Formal PFTs ordered for further evaluation

## 2021-09-09 NOTE — Assessment & Plan Note (Signed)
Healthy weight loss discussed today.  She is working with her OB/GYN on weight loss measures given her history of PCOS.

## 2021-09-09 NOTE — Patient Instructions (Addendum)
Continue Tessalon perles 1 capsule Three times a day as needed for cough -Step up Advair to 230 mcg 2 puffs Twice daily. Brush tongue and rinse mouth afterwards -Albuterol inhaler 2 puffs every 6 hours as needed for shortness of breath or wheezing. This is your rescue inhaler to carry with you for breakthrough symptoms. -Flonase nasal spray 2 sprays each nostril daily -Delsym over the counter 2 tsp Twice daily  -Over the counter allergy medication such as Claritin, Zyrtec, Xyzal or Allegra. Take daily.   Chest x ray today Pulmonary function testing scheduled   Start CPAP therapy once you receive machine. Use CPAP every night, minimum of 4-6 hours a night.  Change equipment every 30 days or as directed by DME. Wash your tubing with warm soap and water daily, hang to dry. Wash humidifier portion weekly.   Prescription provided to you today - AirMini Portable link https://www.resmed.com/en-us/sleep-apnea/cpap-parts-support/sleep-apnea-full-products-list/cpap-machines/airmini-portable-cpap/ You can also google search to see if there is anywhere with alternate pricing. HSA or FSA eligible. This link does include the starter kit with mask and tubing so take that into account when looking.   We discussed how untreated sleep apnea puts an individual at risk for cardiac arrhthymias, pulm HTN, DM, stroke and increases their risk for daytime accidents. We also briefly reviewed treatment options including weight loss, side sleeping position, oral appliance, CPAP therapy or referral to ENT for possible surgical options  Referral to ENT placed today for possible Inspire. You can contact them to set up appointment if you wish to move forward with this.   Follow up in 2 weeks with Dr. Halford Chessman or Alanson Aly. [place] for virtual visit with Kamarian Sahakian,NP. If symptoms do not improve or worsen, please contact office for sooner follow up or seek emergency care.

## 2021-09-09 NOTE — Assessment & Plan Note (Addendum)
She has year-round allergy type symptoms.  She is not currently on any over-the-counter antihistamines.  She was instructed to start Flonase during one of her most recent visits; however, this was not sent as an prescription and she was not aware that it was an over-the-counter medicine.  Do suspect that there is an upper airway component to her cough as well.  We will start her on intranasal steroid with Flonase for better postnasal drainage control.  Also recommended that she start over-the-counter antihistamine.  May need allergy testing in the future if she remains poorly controlled.

## 2021-09-09 NOTE — Assessment & Plan Note (Addendum)
Severe OSA with significant daytime symptoms, on CPAP in the past.  Did not have a good experience with this.  She is understandably frustrated that orders for new CPAP were sent over a year ago and she has still not received the machine.  She has been unable to get in touch with [facility].  We had a very lengthy discussion about treatment options.  Discussed that she may not be a candidate for inspire with her current BMI; however, she is working on weight loss measures so we will send referral to ENT for further evaluation.  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  Discussed that I was not sure if insurance would cover this.  She is willing to pay for it out-of-pocket and has HSA funds available as well.  She was also told in the past that she had to wear a full facemask; I believe she would be a candidate for a nasal pillow mask.  Provided her with a prescription for air mini auto 5-15 with small nasal mask.  Advised her to notify if she has any trouble getting the device or needs anything else from [place].  Patient Instructions  Continue Tessalon perles 1 capsule Three times a day as needed for cough -Step up Advair to 230 mcg 2 puffs Twice daily. Brush tongue and rinse mouth afterwards -Albuterol inhaler 2 puffs every 6 hours as needed for shortness of breath or wheezing. This is your rescue inhaler to carry with you for breakthrough symptoms. -Flonase nasal spray 2 sprays each nostril daily -Delsym over the counter 2 tsp Twice daily  -Over the counter allergy medication such as Claritin, Zyrtec, Xyzal or Allegra. Take daily.   Chest x ray today Pulmonary function testing scheduled   Start CPAP therapy once you receive machine. Use CPAP every night, minimum of 4-6 hours a night.  Change equipment every 30 days or as directed by DME. Wash your tubing with warm soap  and water daily, hang to dry. Wash humidifier portion weekly.   Prescription provided to you today - AirMini Portable link https://www.resmed.com/en-us/sleep-apnea/cpap-parts-support/sleep-apnea-full-products-list/cpap-machines/airmini-portable-cpap/ You can also google search to see if there is anywhere with alternate pricing. HSA or FSA eligible. This link does include the starter kit with mask and tubing so take that into account when looking.   We discussed how untreated sleep apnea puts an individual at risk for cardiac arrhthymias, pulm HTN, DM, stroke and increases their risk for daytime accidents. We also briefly reviewed treatment options including weight loss, side sleeping position, oral appliance, CPAP therapy or referral to ENT for possible surgical options  Referral to ENT placed today for possible Inspire. You can contact them to set up appointment if you wish to move forward with this.   Follow up in 2 weeks with Dr. Halford Chessman or Al[facility] Aly. [place] for virtual visit with Beila Purdie,NP. If symptoms do not improve or worsen, please contact office for sooner follow up or seek emergency care.

## 2021-09-20 NOTE — Progress Notes (Signed)
Reviewed and agree with assessment/plan.   Chesley Mires, MD [facility] Pulmonary/Critical Care 09/20/2021, 7:27 AM Pager:  [phone]

## 2021-10-01 ENCOUNTER — Telehealth (INDEPENDENT_AMBULATORY_CARE_PROVIDER_SITE_OTHER): Payer: Commercial Managed Care - PPO | Admitting: Nurse Practitioner

## 2021-10-01 ENCOUNTER — Encounter: Payer: Self-pay | Admitting: Nurse Practitioner

## 2021-10-01 DIAGNOSIS — J3089 Other allergic rhinitis: Secondary | ICD-10-CM | POA: Diagnosis not present

## 2021-10-01 DIAGNOSIS — J4540 Moderate persistent asthma, uncomplicated: Secondary | ICD-10-CM

## 2021-10-01 DIAGNOSIS — J4541 Moderate persistent asthma with (acute) exacerbation: Secondary | ICD-10-CM

## 2021-10-01 DIAGNOSIS — G4733 Obstructive sleep apnea (adult) (pediatric): Secondary | ICD-10-CM | POA: Diagnosis not present

## 2021-10-01 NOTE — Assessment & Plan Note (Signed)
She has year-round allergy type symptoms. She was doing better with flonase after last visit but hasn't been consistent with use. She is not currently on any over-the-counter antihistamines. Recommended that she start over-the-counter antihistamine and restart intranasal steroid. May need allergy testing in the future if she remains poorly controlled

## 2021-10-01 NOTE — Progress Notes (Signed)
Patient ID: Kayla Sexton, female     DOB: February 05, 1980, 41 y.o.      MRN: [id number]  Chief Complaint  Patient presents with   Follow-up    Still coughing, drainage in throat, white mucus.    Virtual Visit via Video Note  I connected with Kayla Sexton on 10/01/21 at  2:30 PM EDT by a video enabled telemedicine application and verified that I am speaking with the correct person using two identifiers.  Location: Patient: Home Provider: Office   I discussed the limitations of evaluation and management by telemedicine and the availability of in person appointments. The patient expressed understanding and agreed to proceed.  History of Present Illness: 41 year old female, never smoker followed for severe OSA.  She is a patient of Dr. Juanetta Gosling and last seen in office 09/09/2021 by [facility] NP.  Past medical history significant for migraines, GERD, PCOS, ADD, obesity, recurrent bronchitis.   TEST/EVENTS:  06/02/2020 HST: AHI 33.3, SPO2 low 84% 08/27/2021 CXR 2 view outside results: No visible image; read negative chest   06/09/2020: Visit with Kayla Napoleon NP to review home sleep study results.  HST showed severe obstructive sleep apnea with AHI 33.3.  Has significant daytime fatigue and exhaustion.  Feels as though she ran a marathon every day.  Can barely keep her eyes open.  Has previously had a bad experience with CPAP due to a mishap with the humidification.  She is willing to try CPAP therapy again; if unable to tolerate would consider inspire device.  Orders placed for CPAP auto 5-15, mask of choice.  Follow-up 3 months.  09/09/2021: OV with Kayla Ringwald NP for follow-up.  Severe OSA with significant daytime symptoms, on CPAP in the past.  Did not have a good experience with this.  She is understandably frustrated that orders for new CPAP were sent over a year ago and she has still not received the machine.  She has been unable to get in touch with [facility].  We had a very lengthy discussion about treatment options.   Discussed that she may not be a candidate for inspire with her current BMI; however, she is working on weight loss measures so we will send referral to ENT for further evaluation.  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  Discussed that I was not sure if insurance would cover this.  She is willing to pay for it out-of-pocket and has HSA funds available as well.  She was also told in the past that she had to wear a full facemask; I believe she would be a candidate for a nasal pillow mask.  Provided her with a prescription for air mini auto 5-15 with small nasal mask.  Advised her to notify if she has any trouble getting the device or needs anything else from [place]. She has also been dealing with a persistent bout of bronchitis recently.  She was initially treated in mid July by telehealth provider with prednisone and Augmentin.  She then went to an emergency department in [place] on 8/2 for ear pain and persistent cough.  She was treated with [facility] for otitis and provided with Ladona Ridgel.  She was also given a prescription for albuterol rescue inhaler.  CXR did not show any evidence of infection.  She continued to have trouble with her cough and had another  telehealth visit at which point she was prescribed prednisone burst and Advair.  D-dimer was negative. Her history of recurrent bronchitis, reactive dyspnea, wheezing, and allergy type symptoms leading to believe that she may have an asthmatic component to her symptoms.  She also has an elevated eosinophil count to 400 on most recent CBC.  She did have good response to Advair and steroids.  No evidence of bronchospasm on exam today and FeNO was normal at 19 ppb so advised that we could hold off on any further steroids.  Spirometry obtained which showed moderate to severe obstruction.  She is a never smoker, which  further leans towards an asthma diagnosis.  Recommended that we step up her up to high-dose Advair given she is still having some increase in pain from baseline.  We will check a CXR today to rule out superimposed infection.  Formal PFTs ordered for further evaluation  10/01/2021: Today - follow up Patient presents today for follow up via virtual visit. She reports that she was doing better after our last visit. She then went out of town for work last week and left her medications at home. She slept in a hotel and the day after, woke up feeling congested and was coughing more with thick phlegm, clear to white. She has improved some since but is still getting winded with walking longer distances, especially through the airport carrying her bags, and occasional chest congestion. She did feel better on the Advair but hasn't been using it consistently. She's currently in [place] on lay over and will be flying to [place]. She left her medications at home. She does admit that she feels like she would be better if she was taking her medications consistently. Unfortunately, her husband has been sick and is currently on FMLA. She has been trying to care for him and juggle many life stressors so she has put her own care on the back burner. She is trying to be better about this. Denies any fevers, chills, night sweats, hemoptysis, wheezing, leg swelling.  Regarding her CPAP, she is in the process of getting a mini CPAP. She liked this idea after our last visit, especially trying a nasal pillow mask instead of full face. She was going to order one but was unsure of the size mask she would need. She continues to have daytime fatigue symptoms and struggles to keep her eyes open in the evenings. 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 She denies any drowsy driving or morning headaches.   Allergies  Allergen Reactions   Adhesive [Tape] Swelling     Per pt only the 12 lead ekg pads cause "welps and abrasions"-ALSO SOME BANDAIDS CAUSE IRRITATION-OK TO USE PAPER TAPE   Other     RED MEAT-ANAPHYLAXIS  General Anesthesia - nausea and vomiting (scopolamine patch doesn't work)   Vancomycin Hives and Itching   Bactrim [Sulfamethoxazole-Trimethoprim] Rash   Immunization History  Administered Date(s) Administered   DTaP 04/30/2019   HPV Quadrivalent 12/24/2004, 02/23/2005, 06/02/2005   Influenza,inj,Quad PF,6+ Mos 10/26/2012, 11/05/2019   Influenza-Unspecified 12/06/2017   PFIZER(Purple Top)SARS-COV-2 Vaccination 03/02/2019, 06/15/2019   Past Medical History:  Diagnosis Date   Anemia    Cancer ([place]) 2006   CERVICAL    CIN III (cervical intraepithelial neoplasia III)    Dysuria    Family history of adverse reaction to anesthesia    DAD AND BROTHER AND MOM-N/V  GERD (gastroesophageal reflux disease)    OCC   Hematuria    HH (hiatus hernia)    History of cervical dysplasia 2006   CIN1 and CIN2   History of kidney stones    Migraines    PCOS (polycystic ovarian syndrome)    Dr. Helane Rima   PONV (postoperative nausea and vomiting)    HARD TO WAKE UP   Right ureteral calculus    Sleep apnea    DOES NOT USE CPAP    Tobacco History: Social History   Tobacco Use  Smoking Status Never  Smokeless Tobacco Never   Counseling given: Not Answered   Outpatient Medications Prior to Visit  Medication Sig Dispense Refill   albuterol (VENTOLIN HFA) 108 (90 Base) MCG/ACT inhaler Inhale 2 puffs into the lungs every 6 (six) hours as needed for wheezing or shortness of breath. 8 g 2   aspirin-acetaminophen-caffeine (EXCEDRIN MIGRAINE) 250-250-65 MG tablet Take 2 tablets by mouth every 6 (six) hours as needed for headache.     fluticasone (FLONASE) 50 MCG/ACT nasal spray Place 2 sprays into both nostrils daily. (Patient not taking: Reported on 10/01/2021) 16 g 2   fluticasone-salmeterol (ADVAIR HFA) 230-21 MCG/ACT inhaler Inhale 2 puffs into  the lungs 2 (two) times daily. (Patient not taking: Reported on 10/01/2021) 1 each 5   ibuprofen (ADVIL,MOTRIN) 200 MG tablet Take 400 mg by mouth every 6 (six) hours as needed for mild pain or moderate pain.      naproxen sodium (ALEVE) 220 MG tablet Take 440 mg by mouth 2 (two) times daily as needed (pain).     Potassium Citrate 15 MEQ (1620 MG) TBCR Take 2 tablets by mouth in the morning and at bedtime. 120 tablet 11   No facility-administered medications prior to visit.     Review of Systems: As in HPI otherwise negative  Observations/Objective: Patient is well-developed, well-nourished in no acute distress. A&Ox3. Resting comfortably. Unlabored, regular breathing. Speech is clear and coherent with logical content. No significant cough or congestion noted.    Assessment and Plan: Asthma, moderate persistent, poorly-controlled She was started on Advair for moderate persistent asthma with moderate to severe obstruction on spirometry at previous visit.  Initially doing better; however, she had what sounds like possible URI and has started having some increased dyspnea with exertion again due to noncompliance with inhaler therapy and other medications.  Does feel better today.  Encouraged her to restart ICS/LABA therapy.  She can use albuterol as needed.  Offered to send her in a new prescription to a pharmacy near her.  She did not have transportation but her family is going to be visiting her next week so she will have them bring her medicines.  Instructed her to seek further care should she develop worsening respiratory symptoms.  Patient Instructions  Continue Tessalon perles 1 capsule Three times a day as needed for cough -Restart Advair to 230 mcg 2 puffs Twice daily. Brush tongue and rinse mouth afterwards -Albuterol inhaler 2 puffs every 6 hours as needed for shortness of breath or wheezing. This is your rescue inhaler to carry with you for breakthrough symptoms. -Restart Flonase nasal  spray 2 sprays each nostril daily -Delsym over the counter 2 tsp Twice daily  -Start over the counter allergy medication such as Claritin, Zyrtec, Xyzal or Allegra. Take daily.    Start CPAP therapy once you receive machine. Use CPAP every night, minimum of 4-6 hours a night.  Change equipment every 30 days or as  directed by DME. Wash your tubing with warm soap and water daily, hang to dry. Wash humidifier portion weekly  [email or web address]://www.rice-chan.[email or web address]  We discussed how untreated sleep apnea puts an individual at risk for cardiac arrhthymias, pulm HTN, DM, stroke and increases their risk for daytime accidents.    Follow up in 4 weeks with Dr. Halford Chessman or Alanson Aly. [place] for virtual visit with Caitlin Hillmer,NP. If symptoms do not improve or worsen, please contact office for sooner follow up or seek emergency care.     OSA (obstructive sleep apnea) Severe OSA.  We had a very lengthy discussion at her last visit regarding the risks of leaving sleep apnea untreated.  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  She was agreeable to this plan.  Provided with prescription for CPAP 5-15 with nasal pillow mask.  She has looked into ordering 1 of these, found one that is affordable to her.  She was waiting to order it after she found out what size nasal mask she needed.  Provided her with a measuring tool today.  Instructed her to notify us if she has any further issues.  We will reevaluate at follow-up.  Allergic rhinitis She has year-round allergy type symptoms. She was doing better with flonase after last visit but hasn't been consistent with use. She is not currently on any over-the-counter antihistamines. Recommended that she start over-the-counter antihistamine and restart intranasal steroid. May need allergy testing in the  future if she remains poorly controlled    I discussed the assessment and treatment plan with the patient. The patient was provided an opportunity to ask questions and all were answered. The patient agreed with the plan and demonstrated an understanding of the instructions.   The patient was advised to call back or seek an in-person evaluation if the symptoms worsen or if the condition fails to improve as anticipated.  I provided 32 minutes of non-face-to-face time during this encounter.   Clayton Bibles, NP

## 2021-10-01 NOTE — Patient Instructions (Addendum)
Continue Tessalon perles 1 capsule Three times a day as needed for cough -Restart Advair to 230 mcg 2 puffs Twice daily. Brush tongue and rinse mouth afterwards -Albuterol inhaler 2 puffs every 6 hours as needed for shortness of breath or wheezing. This is your rescue inhaler to carry with you for breakthrough symptoms. -Restart Flonase nasal spray 2 sprays each nostril daily -Delsym over the counter 2 tsp Twice daily  -Start over the counter allergy medication such as Claritin, Zyrtec, Xyzal or Allegra. Take daily.    Start CPAP therapy once you receive machine. Use CPAP every night, minimum of 4-6 hours a night.  Change equipment every 30 days or as directed by DME. Wash your tubing with warm soap and water daily, hang to dry. Wash humidifier portion weekly  [email or web address]://www.rice-chan.[email or web address]  We discussed how untreated sleep apnea puts an individual at risk for cardiac arrhthymias, pulm HTN, DM, stroke and increases their risk for daytime accidents.    Follow up in 4 weeks with Dr. Halford Chessman or Alanson Aly. [place] for virtual visit with Karianna Gusman,NP. If symptoms do not improve or worsen, please contact office for sooner follow up or seek emergency care.

## 2021-10-01 NOTE — Assessment & Plan Note (Addendum)
She was started on Advair for moderate persistent asthma with moderate to severe obstruction on spirometry at previous visit.  Initially doing better; however, she had what sounds like possible URI and has started having some increased dyspnea with exertion again due to noncompliance with inhaler therapy and other medications.  Does feel better today.  Encouraged her to restart ICS/LABA therapy.  She can use albuterol as needed.  Offered to send her in a new prescription to a pharmacy near her.  She did not have transportation but her family is going to be visiting her next week so she will have them bring her medicines.  Instructed her to seek further care should she develop worsening respiratory symptoms.  Patient Instructions  Continue Tessalon perles 1 capsule Three times a day as needed for cough -Restart Advair to 230 mcg 2 puffs Twice daily. Brush tongue and rinse mouth afterwards -Albuterol inhaler 2 puffs every 6 hours as needed for shortness of breath or wheezing. This is your rescue inhaler to carry with you for breakthrough symptoms. -Restart Flonase nasal spray 2 sprays each nostril daily -Delsym over the counter 2 tsp Twice daily  -Start over the counter allergy medication such as Claritin, Zyrtec, Xyzal or Allegra. Take daily.    Start CPAP therapy once you receive machine. Use CPAP every night, minimum of 4-6 hours a night.  Change equipment every 30 days or as directed by DME. Wash your tubing with warm soap and water daily, hang to dry. Wash humidifier portion weekly  [email or web address]://www.rice-chan.[email or web address]  We discussed how untreated sleep apnea puts an individual at risk for cardiac arrhthymias, pulm HTN, DM, stroke and increases their risk for daytime accidents.    Follow up in 4 weeks with Dr. Halford Chessman or Alanson Aly. [place] for virtual visit with Oluwasemilore Bahl,NP. If symptoms do not improve or worsen, please  contact office for sooner follow up or seek emergency care.

## 2021-10-01 NOTE — Assessment & Plan Note (Signed)
Severe OSA.  We had a very lengthy discussion at her last visit regarding the risks of leaving sleep apnea untreated.  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  She was agreeable to this plan.  Provided with prescription for CPAP 5-15 with nasal pillow mask.  She has looked into ordering 1 of these, found one that is affordable to her.  She was waiting to order it after she found out what size nasal mask she needed.  Provided her with a measuring tool today.  Instructed her to notify us if she has any further issues.  We will reevaluate at follow-up.

## 2021-10-02 NOTE — Progress Notes (Signed)
Reviewed and agree with assessment/plan.   Chesley Mires, MD [facility] Pulmonary/Critical Care 10/02/2021, 8:16 AM Pager:  [phone]

## 2021-10-29 ENCOUNTER — Telehealth: Payer: Self-pay | Admitting: Family Medicine

## 2021-10-29 NOTE — Telephone Encounter (Signed)
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 She states they put a stent in. She states they only gave her 1 bag of fluid last night post surgery and now she is running a fever and feels she may have dehydration. She is on her way back now. It has been almost 24 hours post surgery and she states she is running a fever. I informed her to go to the ER. She said she will come straight to this hospital. Currently she is 3 hours out. She wanted you aware, she said you know her history.

## 2021-11-03 ENCOUNTER — Ambulatory Visit: Payer: Self-pay | Admitting: Urology

## 2021-11-24 ENCOUNTER — Ambulatory Visit: Payer: Commercial Managed Care - PPO | Admitting: Podiatry

## 2021-11-24 ENCOUNTER — Ambulatory Visit (INDEPENDENT_AMBULATORY_CARE_PROVIDER_SITE_OTHER): Payer: Commercial Managed Care - PPO

## 2021-11-24 ENCOUNTER — Encounter: Payer: Self-pay | Admitting: Podiatry

## 2021-11-24 DIAGNOSIS — M79671 Pain in right foot: Secondary | ICD-10-CM

## 2021-11-24 DIAGNOSIS — M722 Plantar fascial fibromatosis: Secondary | ICD-10-CM | POA: Diagnosis not present

## 2021-11-24 DIAGNOSIS — R52 Pain, unspecified: Secondary | ICD-10-CM

## 2021-11-24 MED ORDER — METHYLPREDNISOLONE 4 MG PO TBPK: ORAL_TABLET | ORAL | 0 refills | Status: DC

## 2021-11-24 MED ORDER — MELOXICAM 15 MG PO TABS: 15.0000 mg | ORAL_TABLET | Freq: Every day | ORAL | 1 refills | Status: DC

## 2021-11-24 MED ORDER — BETAMETHASONE SOD PHOS & ACET 6 (3-3) MG/ML IJ SUSP
3.0000 mg | Freq: Once | INTRAMUSCULAR | Status: AC
  Administered 2021-11-24: 3 mg via INTRA_ARTICULAR

## 2021-11-24 NOTE — Progress Notes (Signed)
   Chief Complaint  Patient presents with   Foot Pain    Right heel pain/ ball of foot     Subjective: 41 y.o. female presenting today for a new complaint of pain regarding  right heel pain has been ongoing for several months.  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  She presents for further treatment and evaluation   Past Medical History:  Diagnosis Date   Anemia    Cancer ([place]) 2006   CERVICAL    CIN III (cervical intraepithelial neoplasia III)    Dysuria    Family history of adverse reaction to anesthesia    DAD AND BROTHER AND MOM-N/V   GERD (gastroesophageal reflux disease)    OCC   Hematuria    HH (hiatus hernia)    History of cervical dysplasia 2006   CIN1 and CIN2   History of kidney stones    Migraines    PCOS (polycystic ovarian syndrome)    Dr. Helane Rima   PONV (postoperative nausea and vomiting)    HARD TO WAKE UP   Right ureteral calculus    Sleep apnea    DOES NOT USE CPAP     Objective: Physical Exam General: The patient is alert and oriented x3 in no acute distress.  Dermatology: Skin is warm, dry and supple bilateral lower extremities. Negative for open lesions or macerations bilateral.   Vascular: Dorsalis Pedis and Posterior Tibial pulses palpable bilateral.  Capillary fill time is immediate to all digits.  Neurological: Epicritic and protective threshold intact bilateral.   Musculoskeletal: Tenderness to palpation to the plantar aspect of the right heel along the plantar fascia. All other joints range of motion within normal limits bilateral. Strength 5/5 in all groups bilateral.   Radiographic exam RT foot 11/24/2021: Normal osseous mineralization. Joint spaces preserved. No fracture/dislocation/boney destruction. No other soft tissue abnormalities or radiopaque foreign bodies.  Small plantar and posterior heel spurs noted on lateral  view  Assessment: 1. Plantar fasciitis right  Plan of Care:  1. Patient evaluated. Xrays reviewed.   2. Injection of 0.5cc Celestone soluspan injected into the right plantar fascia  3. Rx for Medrol Dose Pack placed 4. Rx for Meloxicam ordered for patient. 5. Plantar fascial band(s) dispensed.  Note for work was provided to wear while working.  Patient is a Catering manager for [employer] 6. Instructed patient regarding therapies and modalities at home to alleviate symptoms.  7. Return to clinic in 4 weeks.    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 Dawn (retired from the surgery center) daughter  Edrick Kins, DPM [facility]  Dr. Edrick Kins, DPM    [address], [place] [address]                Office [phone]  Fax [phone]

## 2021-11-26 ENCOUNTER — Other Ambulatory Visit: Payer: Self-pay | Admitting: Podiatry

## 2021-11-26 DIAGNOSIS — M722 Plantar fascial fibromatosis: Secondary | ICD-10-CM

## 2021-11-26 DIAGNOSIS — M79671 Pain in right foot: Secondary | ICD-10-CM

## 2021-11-26 DIAGNOSIS — R52 Pain, unspecified: Secondary | ICD-10-CM

## 2021-12-22 ENCOUNTER — Telehealth: Payer: Self-pay | Admitting: Family Medicine

## 2021-12-22 NOTE — Telephone Encounter (Signed)
Pt called asking if Dr. Darnell Level would take her on as a new pt? 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 Pt stated her boyfriend, along with her parents are all Dr, Synthia Innocent pts. Call back # [phone]

## 2021-12-22 NOTE — Telephone Encounter (Signed)
Pt called asking if Dr. Damita Dunnings would take her on as a new pt? Pt mentioned she used to work here at [employer] [facility] [employer] around 2 years ago & she would like to have [place] as her new pcp. Call back # [phone]

## 2021-12-22 NOTE — Telephone Encounter (Signed)
I am sorry but I recapped on taking new patients. Is anyone available to see this patient here at [facility]?

## 2021-12-23 NOTE — Telephone Encounter (Signed)
LVM for patient to call and set up [facility] appointment

## 2021-12-23 NOTE — Telephone Encounter (Signed)
Noted  

## 2021-12-23 NOTE — Telephone Encounter (Signed)
Ok to add as I see family. Thanks.

## 2022-01-27 ENCOUNTER — Telehealth: Payer: Self-pay | Admitting: Nurse Practitioner

## 2022-01-27 DIAGNOSIS — G4733 Obstructive sleep apnea (adult) (pediatric): Secondary | ICD-10-CM

## 2022-01-27 NOTE — Telephone Encounter (Signed)
Patient is returning a call from April.  She would like a call back regarding her cpap.  Message was read to patient and patient stated that she is not happy with [facility] and would like to use a different supplier.  Please call patient to discuss further at [phone]

## 2022-01-27 NOTE — Telephone Encounter (Signed)
Called [facility] and spoke to [place] and did confirm that patient does not have cpap machine. He states that since old order was placed in 05/2020 that [place] is needing new order. Caryl Pina was able to pull last office note from September and will start to get order processed.  Called and left patient a message that I was placing new cpap order for her given the old order expired. I told her to call office back if she has questions or concerns or to call [facility] and speak with them in the future for questions. Nothing further needed

## 2022-01-27 NOTE — Telephone Encounter (Signed)
Called patient and she states that she had a lot of issues with [facility]. She states that she never received a call what so ever with the order being placed 2022.   She is asking if we know any DME that would accept her insurance and she can buy her machine out right. She does not want to deal with [facility] at all what so ever.   Please advise

## 2022-02-26 ENCOUNTER — Ambulatory Visit (INDEPENDENT_AMBULATORY_CARE_PROVIDER_SITE_OTHER): Payer: Commercial Managed Care - PPO | Admitting: Family Medicine

## 2022-02-26 ENCOUNTER — Encounter: Payer: Self-pay | Admitting: Family Medicine

## 2022-02-26 VITALS — BP 126/86 | HR 105 | Temp 97.3°F | Ht 62.0 in | Wt 200.0 lb

## 2022-02-26 DIAGNOSIS — R7303 Prediabetes: Secondary | ICD-10-CM | POA: Diagnosis not present

## 2022-02-26 DIAGNOSIS — G4733 Obstructive sleep apnea (adult) (pediatric): Secondary | ICD-10-CM

## 2022-02-26 DIAGNOSIS — J4540 Moderate persistent asthma, uncomplicated: Secondary | ICD-10-CM

## 2022-02-26 DIAGNOSIS — E282 Polycystic ovarian syndrome: Secondary | ICD-10-CM

## 2022-02-26 DIAGNOSIS — J3089 Other allergic rhinitis: Secondary | ICD-10-CM

## 2022-02-26 LAB — POCT GLYCOSYLATED HEMOGLOBIN (HGB A1C): Hemoglobin A1C: 5.5 % (ref 4.0–5.6)

## 2022-02-26 MED ORDER — VITAMIN D3 25 MCG (1000 UT) PO CAPS: 1.0000 | ORAL_CAPSULE | Freq: Every day | ORAL | Status: DC

## 2022-02-26 NOTE — Patient Instructions (Addendum)
Call lung doctor's office to follow up on pulmonary function test that was ordered 08/2021.  Take antihistamine like allegra, xyzal, zyrtec during allergy seasons.  Return in 3 months for physical, prior fasting for blood work Check if insurance covers weight loss medicines like Zepbound or Wegovy or Saxenda. If not, see if they would cover Contrave.  If you'd like return sooner to discuss weight loss medicines.

## 2022-02-26 NOTE — Assessment & Plan Note (Signed)
Sees GYN for this. Previously on mounjaro with benefit but insurance stopped covering this. Intolerant of metformin, even extended release formulation (GI upset)

## 2022-02-26 NOTE — Progress Notes (Signed)
Patient ID: Kayla Sexton, female    DOB: [birth year], 42 y.o.   MRN: [id number]  This visit was conducted in person.  BP 126/86   Pulse (!) 105   Temp (!) 97.3 F (36.3 C) (Temporal)   Ht '5\' 2"'$  (1.575 m)   Wt 200 lb (90.7 kg)   LMP 09/29/2020 (Approximate) Comment: PCOS  SpO2 96%   BMI 36.58 kg/m    CC: transfer of care  Subjective:   HPI: Kayla Sexton is a 42 y.o. female presenting on 02/26/2022 for Transitions Of Care (TOC from Dr. Einar Pheasant. )   Transfer of care from Dr Einar Pheasant. I see pt's parents and BF.  Last CPE 04/2020 Well woman - sees GYN Dr Helane Rima. S/p complete hysterectomy with BSO (10/2020).  H/o PCOS - h/o insulin resistance and prediabetes. Was on Mounjaro through GYN - lost 30 lbs with this. However with insurance formulary change this was no longer covered. Has not tolerated metformin including XR form.   Enjoying new job as Catering manager.   Severe OSA - followed by pulm did not tolerate CPAP, plan was trial mini-CPAP machine with nasal pillow mask, was referred to ENT for [facility] evaluation - ongoing difficulty getting this filled. [employer] was unable to get this filled for her. She is looking to pay for it herself.   Asthma, mod persistent - managed with advair 230 2 puffs BID and albuterol PRN, latest spirometry showed mod-severe obstruction. Non smoker. Planned PFTs for further evaluation. Hospitalized in [place] summer 2023 - that was when it was diagnosed.   Allergic rhinitis - normally during summer and early spring - manages with mucinex and OTC allegra. Predominant itchy watery eyes, PNdrainage.   Plantar fasciitis - on meloxicam for this, takes PRN. Improved after steroid injection and medrol dosepak.   Markedly low vit D when last checked 2021. She takes 1000 IU daily.      Relevant past medical, surgical, family and social history reviewed and updated as indicated. Interim medical history since our last visit reviewed. Allergies and medications  reviewed and updated. Outpatient Medications Prior to Visit  Medication Sig Dispense Refill   cyclobenzaprine (FLEXERIL) 5 MG tablet Take 1 tablet (5 mg total) by mouth 2 (two) times daily as needed for muscle spasms.     albuterol (VENTOLIN HFA) 108 (90 Base) MCG/ACT inhaler Inhale 2 puffs into the lungs every 6 (six) hours as needed for wheezing or shortness of breath. 8 g 2   fluticasone-salmeterol (ADVAIR HFA) 230-21 MCG/ACT inhaler Inhale 2 puffs into the lungs 2 (two) times daily. 1 each 5   meloxicam (MOBIC) 15 MG tablet Take 1 tablet (15 mg total) by mouth daily. 30 tablet 1   naproxen sodium (ALEVE) 220 MG tablet Take 440 mg by mouth 2 (two) times daily as needed (pain).     aspirin-acetaminophen-caffeine (EXCEDRIN MIGRAINE) 250-250-65 MG tablet Take 2 tablets by mouth every 6 (six) hours as needed for headache.     fluticasone (FLONASE) 50 MCG/ACT nasal spray Place 2 sprays into both nostrils daily. (Patient not taking: Reported on 10/01/2021) 16 g 2   ibuprofen (ADVIL,MOTRIN) 200 MG tablet Take 400 mg by mouth every 6 (six) hours as needed for mild pain or moderate pain.      methylPREDNISolone (MEDROL DOSEPAK) 4 MG TBPK tablet 6 day dose pack - take as directed 21 tablet 0   Potassium Citrate 15 MEQ (1620 MG) TBCR Take 2 tablets by mouth in the morning  and at bedtime. 120 tablet 11   No facility-administered medications prior to visit.     Per HPI unless specifically indicated in ROS section below Review of Systems  Objective:  BP 126/86   Pulse (!) 105   Temp (!) 97.3 F (36.3 C) (Temporal)   Ht '5\' 2"'$  (1.575 m)   Wt 200 lb (90.7 kg)   LMP 09/29/2020 (Approximate) Comment: PCOS  SpO2 96%   BMI 36.58 kg/m   Wt Readings from Last 3 Encounters:  02/26/22 200 lb (90.7 kg)  10/01/21 189 lb (85.7 kg)  09/09/21 192 lb 9.6 oz (87.4 kg)      Physical Exam Vitals and nursing note reviewed.  Constitutional:      Appearance: Normal appearance. She is not ill-appearing.  HENT:      Head: Normocephalic and atraumatic.     Mouth/Throat:     Mouth: Mucous membranes are moist.     Pharynx: Oropharynx is clear. No oropharyngeal exudate or posterior oropharyngeal erythema.  Eyes:     Extraocular Movements: Extraocular movements intact.     Pupils: Pupils are equal, round, and reactive to light.  Cardiovascular:     Rate and Rhythm: Normal rate and regular rhythm.     Pulses: Normal pulses.     Heart sounds: Normal heart sounds. No murmur heard. Pulmonary:     Effort: Pulmonary effort is normal. No respiratory distress.     Breath sounds: Normal breath sounds. No wheezing, rhonchi or rales.  Musculoskeletal:     Right lower leg: No edema.     Left lower leg: No edema.  Skin:    General: Skin is warm and dry.     Findings: No rash.  Neurological:     Mental Status: She is alert.  Psychiatric:        Mood and Affect: Mood normal.        Behavior: Behavior normal.       Results for orders placed or performed in visit on 02/26/22  POCT glycosylated hemoglobin (Hb A1C)  Result Value Ref Range   Hemoglobin A1C 5.5 4.0 - 5.6 %   HbA1c POC (<> result, manual entry)     HbA1c, POC (prediabetic range)     HbA1c, POC (controlled diabetic range)      Assessment & Plan:   Problem List Items Addressed This Visit     Severe obesity (BMI 35.0-39.9) with comorbidity ([place])    Weight gain noted over the past several months, she notes she had lost 30 lbs while on Mounjaro. Interested in bariatric medication. Names of meds provided for her to check with insurance to see if any are covered. If she finds one she'd like to try, she will schedule sooner OV to review.       OSA (obstructive sleep apnea)    Severe followed by pulm. Still in process of trying to get mini-CPAP machine with nasal pillow.       PCOS (polycystic ovarian syndrome)    Sees GYN for this. Previously on mounjaro with benefit but insurance stopped covering this. Intolerant of metformin, even extended  release formulation (GI upset)      Prediabetes    Update A1c today.       Relevant Orders   POCT glycosylated hemoglobin (Hb A1C) (Completed)   Asthma, moderate persistent, poorly-controlled - Primary    Appreciate pulm care - continues daily advair use with PRN albuterol.  It seems she's due for PFTs - I asked her  to reach out to pulm office about scheduling.       Allergic rhinitis    Endorses seasonal allergic rhinitis, predominant rhinitis, allergic conjunctivitis symptoms - discussed need to use antihistamine during allergy season to help control asthma symptoms.         Meds ordered this encounter  Medications   Cholecalciferol (VITAMIN D3) 25 MCG (1000 UT) CAPS    Sig: Take 1 capsule (1,000 Units total) by mouth daily.    Dispense:  30 capsule    Orders Placed This Encounter  Procedures   POCT glycosylated hemoglobin (Hb A1C)    Patient Instructions  Call lung doctor's office to follow up on pulmonary function test that was ordered 08/2021.  Take antihistamine like allegra, xyzal, zyrtec during allergy seasons.  Return in 3 months for physical, prior fasting for blood work Check if insurance covers weight loss medicines like Zepbound or Wegovy or Saxenda. If not, see if they would cover Contrave.  If you'd like return sooner to discuss weight loss medicines.   Follow up plan: Return in about 3 months (around 05/27/2022), or if symptoms worsen or fail to improve, for annual exam, prior fasting for blood work.  Ria Bush, MD

## 2022-02-26 NOTE — Assessment & Plan Note (Signed)
Severe followed by pulm. Still in process of trying to get mini-CPAP machine with nasal pillow.

## 2022-02-26 NOTE — Assessment & Plan Note (Signed)
Update A1c today 

## 2022-02-26 NOTE — Assessment & Plan Note (Addendum)
Appreciate pulm care - continues daily advair use with PRN albuterol.  It seems she's due for PFTs - I asked her to reach out to pulm office about scheduling.

## 2022-02-26 NOTE — Assessment & Plan Note (Signed)
Endorses seasonal allergic rhinitis, predominant rhinitis, allergic conjunctivitis symptoms - discussed need to use antihistamine during allergy season to help control asthma symptoms.

## 2022-02-26 NOTE — Assessment & Plan Note (Signed)
Weight gain noted over the past several months, she notes she had lost 30 lbs while on Mounjaro. Interested in bariatric medication. Names of meds provided for her to check with insurance to see if any are covered. If she finds one she'd like to try, she will schedule sooner OV to review.

## 2022-03-19 ENCOUNTER — Telehealth: Payer: Self-pay | Admitting: Urology

## 2022-03-19 DIAGNOSIS — Z87442 Personal history of urinary calculi: Secondary | ICD-10-CM

## 2022-03-19 MED ORDER — TAMSULOSIN HCL 0.4 MG PO CAPS: 0.4000 mg | ORAL_CAPSULE | Freq: Every day | ORAL | 0 refills | Status: DC

## 2022-03-19 NOTE — Telephone Encounter (Signed)
Spoke with patient and advised results   

## 2022-03-19 NOTE — Telephone Encounter (Signed)
Patient last seen 11/04/20, has cancelled several appts since that, is scheduled to see you 03/29/22, pt requesting flomax rx. Please advise.

## 2022-03-19 NOTE — Telephone Encounter (Signed)
Patient is requesting that Flomax be called in for her as she is trying to pass a kidney stone. We have no appointments available today, and she does not want to go to the ER to be seen today. 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 Pharmacy is [facility].

## 2022-03-19 NOTE — Telephone Encounter (Signed)
Rx sent 

## 2022-03-29 ENCOUNTER — Ambulatory Visit: Payer: Commercial Managed Care - PPO | Admitting: Family Medicine

## 2022-03-29 ENCOUNTER — Telehealth: Payer: Self-pay

## 2022-03-29 ENCOUNTER — Encounter: Payer: Self-pay | Admitting: Family Medicine

## 2022-03-29 ENCOUNTER — Ambulatory Visit (INDEPENDENT_AMBULATORY_CARE_PROVIDER_SITE_OTHER): Payer: Commercial Managed Care - PPO | Admitting: Family Medicine

## 2022-03-29 ENCOUNTER — Ambulatory Visit: Payer: Self-pay | Admitting: Physician Assistant

## 2022-03-29 VITALS — BP 130/90 | HR 108 | Temp 98.3°F | Ht 62.0 in | Wt 199.5 lb

## 2022-03-29 DIAGNOSIS — S4292XA Fracture of left shoulder girdle, part unspecified, initial encounter for closed fracture: Secondary | ICD-10-CM | POA: Diagnosis not present

## 2022-03-29 DIAGNOSIS — C539 Malignant neoplasm of cervix uteri, unspecified: Secondary | ICD-10-CM | POA: Insufficient documentation

## 2022-03-29 DIAGNOSIS — M25512 Pain in left shoulder: Secondary | ICD-10-CM | POA: Diagnosis not present

## 2022-03-29 DIAGNOSIS — S42201A Unspecified fracture of upper end of right humerus, initial encounter for closed fracture: Secondary | ICD-10-CM | POA: Diagnosis not present

## 2022-03-29 DIAGNOSIS — Z8541 Personal history of malignant neoplasm of cervix uteri: Secondary | ICD-10-CM | POA: Insufficient documentation

## 2022-03-29 MED ORDER — TRAMADOL HCL 50 MG PO TABS: 50.0000 mg | ORAL_TABLET | ORAL | 0 refills | Status: DC | PRN

## 2022-03-29 MED ORDER — TRAMADOL HCL 50 MG PO TABS: 50.0000 mg | ORAL_TABLET | Freq: Three times a day (TID) | ORAL | 0 refills | Status: DC | PRN

## 2022-03-29 NOTE — Telephone Encounter (Signed)
Per appt notes pt already has appt to see Dr Lorelei Pont 03/29/22 at 2:40. Sending note to Dr Lorelei Pont and [place] pool.

## 2022-03-29 NOTE — Progress Notes (Unsigned)
Kayla Sexton T. Kayla Finlayson, MD, [place] at [facility] [name], [address]  Phone: [phone]  FAX: [place] - 42 y.o. female  MRN [id number]  Date of Birth: [birth year]  Date: 03/29/2022  PCP: Ria Bush, MD  Referral: Ria Bush, MD  Chief Complaint  Patient presents with   Shoulder Injury    Dislocated Left Shoulder and Ball Fx after a fall   Subjective:   Kayla Sexton is a 42 y.o. very pleasant female patient with Body mass index is 36.49 kg/m. who presents with the following:  Date of injury: Third, 2024.  Acute shoulder dislocation with Hill-Sachs deformity - proximal humerus fracture.  On further review of the x-rays, patient does have a proximal humerus fracture.  This is not completely visualized all that well on the plain x-rays from [place].  She is currently in a shoulder immobilizer, after going to the emergency room within the last 24 hours in [place].  They did relocate her shoulder.  She is currently in severe pain. She is also having some mild numbness in the left hand.  Flight attendant now, and she was in [place].  Turned on the water, and it was a long day.  Stepped into the shower, and feet went out from under. Last night - flew in from [place] last night.   Needs FMLA for work - [employer] paperwork for work  Review of Systems is noted in the HPI, as appropriate  Objective:   BP (!) 130/90   Pulse (!) 108   Temp 98.3 F (36.8 C) (Temporal)   Ht '5\' 2"'$  (1.575 m)   Wt 199 lb 8 oz (90.5 kg)   LMP 09/29/2020 (Approximate) Comment: PCOS  SpO2 98%   BMI 36.49 kg/m   GEN: No acute distress; alert,appropriate. PULM: Breathing comfortably in no respiratory distress PSYCH: Normally interactive.  Examined in a sling.  She is tender to palpation at the proximal humerus.  She does feel more comfortable with readjustment of sling and appropriate placement of the  shoulder immobilizer.  Laboratory and Imaging Data:  Assessment and Plan:     ICD-10-CM   1. Traumatic closed displaced fracture of left shoulder with anterior dislocation, initial encounter  S42.92XA     2. Acute pain of left shoulder  M25.512     3. Closed fracture of proximal end of right humerus, unspecified fracture morphology, initial encounter  S42.201A      Follow-up traumatic dislocation and proximal humerus fracture from yesterday, date of injury March 28, 2022.  She already has scheduled up follow-up with orthopedics tomorrow.  She requests that I complete her FMLA paperwork.  This is reasonable.  For additional follow-up FMLA paperwork, we discussed, and she will get orthopedics to complete it.  Medication Management during today's office visit: Meds ordered this encounter  Medications   DISCONTD: traMADol (ULTRAM) 50 MG tablet    Sig: Take 1 tablet (50 mg total) by mouth every 4 (four) hours as needed.    Dispense:  20 tablet    Refill:  0   traMADol (ULTRAM) 50 MG tablet    Sig: Take 1 tablet (50 mg total) by mouth every 8 (eight) hours as needed for moderate pain.    Dispense:  20 tablet    Refill:  0   Medications Discontinued During This Encounter  Medication Reason   cyclobenzaprine (FLEXERIL) 5 MG tablet Dose change  Cholecalciferol (VITAMIN D3) 25 MCG (1000 UT) CAPS Dose change   tamsulosin (FLOMAX) 0.4 MG CAPS capsule Completed Course   meloxicam (MOBIC) 15 MG tablet Completed Course   traMADol (ULTRAM) 50 MG tablet Reorder   traMADol (ULTRAM) 50 MG tablet     Orders placed today for conditions managed today: No orders of the defined types were placed in this encounter.   Disposition: No follow-ups on file.  Dr[employer] One speech-to-text software was used for transcription in this dictation.  Possible transcriptional errors can occur using Editor, commissioning.   Signed,  Maud Deed. Roma Bierlein, MD   Outpatient Encounter Medications as of  03/29/2022  Medication Sig   albuterol (VENTOLIN HFA) 108 (90 Base) MCG/ACT inhaler Inhale 2 puffs into the lungs every 6 (six) hours as needed for wheezing or shortness of breath.   cyclobenzaprine (FLEXERIL) 10 MG tablet Take 10 mg by mouth at bedtime.   fluticasone-salmeterol (ADVAIR HFA) 230-21 MCG/ACT inhaler Inhale 2 puffs into the lungs 2 (two) times daily.   naproxen sodium (ALEVE) 220 MG tablet Take 440 mg by mouth 2 (two) times daily as needed (pain).   traMADol (ULTRAM) 50 MG tablet Take 1 tablet (50 mg total) by mouth every 8 (eight) hours as needed for moderate pain.   [DISCONTINUED] tamsulosin (FLOMAX) 0.4 MG CAPS capsule Take 1 capsule (0.4 mg total) by mouth daily.   [DISCONTINUED] Cholecalciferol (VITAMIN D3) 25 MCG (1000 UT) CAPS Take 1 capsule (1,000 Units total) by mouth daily.   [DISCONTINUED] cyclobenzaprine (FLEXERIL) 5 MG tablet Take 1 tablet (5 mg total) by mouth 2 (two) times daily as needed for muscle spasms.   [DISCONTINUED] meloxicam (MOBIC) 15 MG tablet Take 1 tablet (15 mg total) by mouth daily.   [DISCONTINUED] traMADol (ULTRAM) 50 MG tablet Take 50 mg by mouth every 4 (four) hours as needed. (Patient not taking: Reported on 03/29/2022)   [DISCONTINUED] traMADol (ULTRAM) 50 MG tablet Take 1 tablet (50 mg total) by mouth every 4 (four) hours as needed.   No facility-administered encounter medications on file as of 03/29/2022.

## 2022-03-29 NOTE — Telephone Encounter (Signed)
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 told her she dislocated her shoulder. Translation No Nurse Assessment Nurse: Rock Nephew, RN, Megan Date/Time (Eastern Time): 03/29/2022 4:48:19 AM Confirm and document reason for call. If symptomatic, describe symptoms. 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 told her she dislocated her shoulder in the ball in the shoulder, [place] doesn't have very good health care. Does the patient have any new or worsening symptoms? ---Yes Will a triage be completed? ---Yes Related visit to physician within the last 2 weeks? ---Yes Does the PT have any chronic conditions? (i.e. diabetes, asthma, this includes High risk factors for pregnancy, etc.) ---Unknown Is the patient pregnant or possibly pregnant? (Ask all females between the ages of 92-55) ---No Is this a behavioral health or substance abuse call? ---No Guidelines Guideline Title Affirmed Question Affirmed Notes Nurse Date/Time (Eastern Time) Shoulder Injury Can't move injured shoulder normally (e.g., full range of motion, able to touch top of head) Wells,  RN, Megan 03/29/2022 4:50:00 AM PLEASE NOTE: All timestamps contained within this report are represented as [place] Standard Time. CONFIDENTIALTY NOTICE: This fax transmission is intended only for the addressee. It contains information that is legally privileged, confidential or otherwise protected from use or disclosure. If you are not the intended recipient, you are strictly prohibited from reviewing, disclosing, copying using or disseminating any of this information or taking any action in reliance on or regarding this information. If you have received this fax in error, please notify us immediately by telephone so that we can arrange for its return to [place]. Phone: [phone], Toll-Free: [phone], Fax: [phone] Page: 2 of 2 Call Id: [id number] [place]. Time Eilene Ghazi Time) Disposition Final User 03/29/2022 4:52:31 AM See PCP within 24 Hours Yes Rock Nephew, RN, Jinny Blossom Final Disposition 03/29/2022 4:52:31 AM See PCP within 24 Hours Yes Rock Nephew, RN, Jinny Blossom Caller Disagree/Comply Comply Caller Understands Yes PreDisposition InappropriateToAsk Care Advice Given Per Guideline SEE PCP WITHIN 24 HOURS: * IF OFFICE WILL BE OPEN: You need to be examined within the next 24 hours. Call your doctor (or NP/PA) when the office opens and make an appointment. USE A COLD PACK FOR PAIN, SWELLING, OR BRUISING: * Put a cold pack or an ice bag (wrapped in a moist towel) on the area for 20 minutes. * Repeat in 1 hour, then every 4 hours while awake. * IBUPROFEN (E.G., MOTRIN, ADVIL): Take 400 mg (two 200 mg pills) by mouth every 6 hours. The most you should take is 6 pills a day (1,200 mg total). CALL BACK IF: * You become worse * Pain becomes severe Referrals REFERRED TO PCP OFFIC

## 2022-03-30 ENCOUNTER — Telehealth: Payer: Self-pay | Admitting: Family Medicine

## 2022-03-30 ENCOUNTER — Other Ambulatory Visit: Payer: Self-pay | Admitting: Physician Assistant

## 2022-03-30 ENCOUNTER — Encounter: Payer: Self-pay | Admitting: Family Medicine

## 2022-03-30 DIAGNOSIS — S43005A Unspecified dislocation of left shoulder joint, initial encounter: Secondary | ICD-10-CM

## 2022-03-30 NOTE — Telephone Encounter (Signed)
Patient emailed over [facility] paperwork. Placed in [place] box in front. Thank you!

## 2022-03-30 NOTE — Telephone Encounter (Signed)
Pt requested to pick up ppw instead of faxing it. Pt states the ppw needed to be comp by end of week.

## 2022-03-30 NOTE — Telephone Encounter (Signed)
We received FMLA paperwork for patient.  This has been placed in your inbox for completion and signing.  Due by end of week, per patient.  Patient will pick up paperwork when complete instead of faxing it.  She will need to fill out and sign three pages prior to turning it in.

## 2022-03-31 ENCOUNTER — Ambulatory Visit
Admission: RE | Admit: 2022-03-31 | Discharge: 2022-03-31 | Disposition: A | Discharge status: Home / Self Care | Payer: Commercial Managed Care - PPO | Source: Ambulatory Visit | Attending: Physician Assistant | Admitting: Physician Assistant

## 2022-03-31 DIAGNOSIS — S43005A Unspecified dislocation of left shoulder joint, initial encounter: Secondary | ICD-10-CM

## 2022-03-31 NOTE — Telephone Encounter (Signed)
Patient is coming in later today to fill out and sign form for FMLA.  I have the form at my desk.

## 2022-03-31 NOTE — Telephone Encounter (Signed)
done 

## 2022-03-31 NOTE — Telephone Encounter (Signed)
Patient filled out her portion of the leave form.  Patient was given copies of the form while in office.  Faxed completed form to fax# [phone].  Received fax confirmation.  Notified patient this was faxed via mychart.

## 2022-03-31 NOTE — Telephone Encounter (Signed)
Copies made for scanning and myself.

## 2022-04-22 ENCOUNTER — Other Ambulatory Visit: Payer: Commercial Managed Care - PPO

## 2022-05-17 ENCOUNTER — Other Ambulatory Visit (INDEPENDENT_AMBULATORY_CARE_PROVIDER_SITE_OTHER): Payer: Commercial Managed Care - PPO

## 2022-05-17 ENCOUNTER — Other Ambulatory Visit: Payer: Self-pay | Admitting: Family Medicine

## 2022-05-17 DIAGNOSIS — E559 Vitamin D deficiency, unspecified: Secondary | ICD-10-CM

## 2022-05-17 DIAGNOSIS — E282 Polycystic ovarian syndrome: Secondary | ICD-10-CM

## 2022-05-17 DIAGNOSIS — R7303 Prediabetes: Secondary | ICD-10-CM

## 2022-05-17 LAB — COMPREHENSIVE METABOLIC PANEL
ALT: 16 U/L (ref 0–35)
AST: 15 U/L (ref 0–37)
Albumin: 3.8 g/dL (ref 3.5–5.2)
Alkaline Phosphatase: 91 U/L (ref 39–117)
BUN: 13 mg/dL (ref 6–23)
CO2: 26 mEq/L (ref 19–32)
Calcium: 8.9 mg/dL (ref 8.4–10.5)
Chloride: 106 mEq/L (ref 96–112)
Creatinine, Ser: 0.66 mg/dL (ref 0.40–1.20)
GFR: 108.52 mL/min (ref >60.00)
Glucose, Bld: 95 mg/dL (ref 70–99)
Potassium: 4 mEq/L (ref 3.5–5.1)
Sodium: 142 mEq/L (ref 135–145)
Total Bilirubin: 0.5 mg/dL (ref 0.2–1.2)
Total Protein: 6.4 g/dL (ref 6.0–8.3)

## 2022-05-17 LAB — LIPID PANEL
Cholesterol: 228 mg/dL — ABNORMAL HIGH (ref 0–200)
HDL: 44.9 mg/dL (ref >39.00)
LDL Cholesterol: 151 mg/dL — ABNORMAL HIGH (ref 0–99)
NonHDL: 183.2
Total CHOL/HDL Ratio: 5
Triglycerides: 162 mg/dL — ABNORMAL HIGH (ref 0.0–149.0)
VLDL: 32.4 mg/dL (ref 0.0–40.0)

## 2022-05-17 LAB — TSH: TSH: 1.15 u[IU]/mL (ref 0.35–5.50)

## 2022-05-17 LAB — VITAMIN D 25 HYDROXY (VIT D DEFICIENCY, FRACTURES): VITD: 11.79 ng/mL — ABNORMAL LOW (ref 30.00–100.00)

## 2022-05-24 ENCOUNTER — Ambulatory Visit (INDEPENDENT_AMBULATORY_CARE_PROVIDER_SITE_OTHER)
Admission: RE | Admit: 2022-05-24 | Discharge: 2022-05-24 | Disposition: A | Discharge status: Home / Self Care | Payer: Commercial Managed Care - PPO | Source: Ambulatory Visit | Attending: Nurse Practitioner | Admitting: Nurse Practitioner

## 2022-05-24 ENCOUNTER — Ambulatory Visit (INDEPENDENT_AMBULATORY_CARE_PROVIDER_SITE_OTHER): Payer: Commercial Managed Care - PPO | Admitting: Nurse Practitioner

## 2022-05-24 ENCOUNTER — Encounter: Payer: Self-pay | Admitting: Nurse Practitioner

## 2022-05-24 VITALS — BP 122/84 | HR 96 | Temp 98.2°F | Resp 16 | Ht 62.0 in | Wt 200.0 lb

## 2022-05-24 DIAGNOSIS — R1084 Generalized abdominal pain: Secondary | ICD-10-CM

## 2022-05-24 LAB — CBC
HCT: 43.1 % (ref 36.0–46.0)
Hemoglobin: 14.5 g/dL (ref 12.0–15.0)
MCHC: 33.5 g/dL (ref 30.0–36.0)
MCV: 85.8 fl (ref 78.0–100.0)
Platelets: 337 10*3/uL (ref 150.0–400.0)
RBC: 5.02 Mil/uL (ref 3.87–5.11)
RDW: 13.8 % (ref 11.5–15.5)
WBC: 8.8 10*3/uL (ref 4.0–10.5)

## 2022-05-24 LAB — COMPREHENSIVE METABOLIC PANEL
ALT: 22 U/L (ref 0–35)
AST: 20 U/L (ref 0–37)
Albumin: 4 g/dL (ref 3.5–5.2)
Alkaline Phosphatase: 93 U/L (ref 39–117)
BUN: 10 mg/dL (ref 6–23)
CO2: 29 mEq/L (ref 19–32)
Calcium: 9.1 mg/dL (ref 8.4–10.5)
Chloride: 103 mEq/L (ref 96–112)
Creatinine, Ser: 0.7 mg/dL (ref 0.40–1.20)
GFR: 106.98 mL/min (ref >60.00)
Glucose, Bld: 77 mg/dL (ref 70–99)
Potassium: 3.9 mEq/L (ref 3.5–5.1)
Sodium: 141 mEq/L (ref 135–145)
Total Bilirubin: 0.6 mg/dL (ref 0.2–1.2)
Total Protein: 7.1 g/dL (ref 6.0–8.3)

## 2022-05-24 LAB — LIPASE: Lipase: 19 U/L (ref 11.0–59.0)

## 2022-05-24 NOTE — Assessment & Plan Note (Addendum)
Query constipation with recent surgery a little over a month ago.  Will obtain abdominal x-ray 1 view.  Along with some basic blood work.  Patient has been using NSAIDs inclusive of Celebrex so gastric ulcer also on the radar.  Pending labs and x-ray Patient is still passing gas and having bowel movements per her report last 1 today

## 2022-05-24 NOTE — Patient Instructions (Signed)
Nice to see you today I will be in touch with the labs and xray once I have them Keep your appointment with Dr. Sharen Hones as scheduled

## 2022-05-24 NOTE — Progress Notes (Signed)
Acute Office Visit  Subjective:     Patient ID: Kayla Sexton, female    DOB: 07/24/1980, 42 y.o.   MRN: [id number]  Chief Complaint  Patient presents with   GI Problem    Feels like menstrual cramps right around belly button, bloated, no problem using the bathroom. Tired eating a bland diet.     Patient is in today for stomach issues with a history of OSA, asthma, Cercival cancer, prediabetes, and ADD  States that she had it for years and was tested for celiac in the past and was negative. States that over the past 2 weeks it has been worse. States that she is on stool softners because she is on some medications that could cause constipation. States that  the pain is worse after she eats. States that she went to the bathroom this morning and it was normal.  Stats that she thought she might have been constipated and took a laxative. States that it was just water and she is still having the discomfort   States that she has been doing a bland diet and it still happens.  States it will happen after she eats and can last for an hour as a cramp that is constant. Layig flat helps it out. No alcohol. Ibuprofen and aleve as needed  Celebrex bid since 04/05/2022  Review of Systems  Constitutional:  Negative for chills and fever.  Respiratory:  Negative for shortness of breath.   Cardiovascular:  Negative for chest pain.  Gastrointestinal:  Positive for abdominal pain and vomiting. Negative for blood in stool, constipation, diarrhea and nausea.  Genitourinary:  Negative for dysuria, frequency and hematuria.  Neurological:  Negative for headaches.  Psychiatric/Behavioral:  Negative for hallucinations and suicidal ideas.         Objective:    BP 122/84   Pulse 96   Temp 98.2 F (36.8 C)   Resp 16   Ht 5\' 2"  (1.575 m)   Wt 200 lb (90.7 kg)   LMP 09/29/2020 (Approximate) Comment: PCOS  SpO2 98%   BMI 36.58 kg/m    Physical Exam Vitals and nursing note reviewed.   Constitutional:      Appearance: Normal appearance.  Cardiovascular:     Rate and Rhythm: Normal rate and regular rhythm.     Heart sounds: Normal heart sounds.  Pulmonary:     Effort: Pulmonary effort is normal.     Breath sounds: Normal breath sounds.  Abdominal:     General: Bowel sounds are normal. There is no distension.     Palpations: There is no mass.     Tenderness: There is abdominal tenderness.     Hernia: No hernia is present.    Neurological:     Mental Status: She is alert.     No results found for any visits on 05/24/22.      Assessment & Plan:   Problem List Items Addressed This Visit       Other   Generalized abdominal pain - Primary    Query constipation with recent surgery a little over a month ago.  Will obtain abdominal x-ray 1 view.  Along with some basic blood work.  Patient has been using NSAIDs inclusive of Celebrex so gastric ulcer also on the radar.  Pending labs and x-ray Patient is still passing gas and having bowel movements per her report last 1 today      Relevant Orders   CBC   Lipase   Comprehensive  metabolic panel   DG Abd 1 View   Urinalysis w microscopic + reflex cultur    No orders of the defined types were placed in this encounter.   Return if symptoms worsen or fail to improve, for As scheduled with Dr. Cedric Fishman, NP

## 2022-05-25 ENCOUNTER — Encounter: Payer: Self-pay | Admitting: Nurse Practitioner

## 2022-05-26 ENCOUNTER — Encounter: Payer: Self-pay | Admitting: Nurse Practitioner

## 2022-05-26 LAB — URINE CULTURE
MICRO NUMBER:: 14888811
Result:: NO GROWTH
SPECIMEN QUALITY:: ADEQUATE

## 2022-05-26 LAB — URINALYSIS W MICROSCOPIC + REFLEX CULTURE
Bacteria, UA: NONE SEEN /HPF
Bilirubin Urine: NEGATIVE
Glucose, UA: NEGATIVE
Hyaline Cast: NONE SEEN /LPF
Ketones, ur: NEGATIVE
Nitrites, Initial: NEGATIVE
Protein, ur: NEGATIVE
Specific Gravity, Urine: 1.015 (ref 1.001–1.035)
Squamous Epithelial / HPF: NONE SEEN /HPF (ref <=5)
pH: <=5 (ref 5.0–8.0)

## 2022-05-26 LAB — CULTURE INDICATED

## 2022-05-28 ENCOUNTER — Encounter: Payer: Self-pay | Admitting: Family Medicine

## 2022-05-28 ENCOUNTER — Ambulatory Visit (INDEPENDENT_AMBULATORY_CARE_PROVIDER_SITE_OTHER): Payer: Commercial Managed Care - PPO | Admitting: Family Medicine

## 2022-05-28 VITALS — BP 126/84 | HR 97 | Temp 97.3°F | Ht 61.25 in | Wt 198.0 lb

## 2022-05-28 DIAGNOSIS — Z23 Encounter for immunization: Secondary | ICD-10-CM

## 2022-05-28 DIAGNOSIS — Z0000 Encounter for general adult medical examination without abnormal findings: Secondary | ICD-10-CM | POA: Diagnosis not present

## 2022-05-28 DIAGNOSIS — Z87442 Personal history of urinary calculi: Secondary | ICD-10-CM

## 2022-05-28 DIAGNOSIS — E282 Polycystic ovarian syndrome: Secondary | ICD-10-CM

## 2022-05-28 DIAGNOSIS — G4733 Obstructive sleep apnea (adult) (pediatric): Secondary | ICD-10-CM

## 2022-05-28 DIAGNOSIS — J4540 Moderate persistent asthma, uncomplicated: Secondary | ICD-10-CM

## 2022-05-28 DIAGNOSIS — E559 Vitamin D deficiency, unspecified: Secondary | ICD-10-CM

## 2022-05-28 DIAGNOSIS — E785 Hyperlipidemia, unspecified: Secondary | ICD-10-CM

## 2022-05-28 DIAGNOSIS — R7303 Prediabetes: Secondary | ICD-10-CM

## 2022-05-28 MED ORDER — CHOLECALCIFEROL 1.25 MG (50000 UT) PO CAPS: 50000.0000 [IU] | ORAL_CAPSULE | Freq: Every day | ORAL | 4 refills | Status: DC

## 2022-05-28 NOTE — Progress Notes (Addendum)
Ph: [phone]       Fax: [phone]   Patient ID: Kayla Sexton, female    DOB: 1980/08/23, 42 y.o.   MRN: [id number]  This visit was conducted in person.  BP 126/84   Pulse 97   Temp (!) 97.3 F (36.3 C) (Temporal)   Ht 5' 1.25" (1.556 m)   Wt 198 lb (89.8 kg)   LMP 09/29/2020 (Approximate) Comment: PCOS  SpO2 98%   BMI 37.11 kg/m    CC: CPE Subjective:   HPI: Kayla Sexton is a 42 y.o. female presenting on 05/28/2022 for Annual Exam   Seen this week with generalized periumbilical abd pain worse with food, in setting of starting celebrex 100mg  BID. Found to have microhematuria RBC 10-20, with 1+ LE WBC 6-10, UCx negative for infection. Known h/o kidney stones - she plans to follow up with urology. She notes some left lower back discomfort. Abd pain is better.   Traumatic left shoulder displacement with associated Hill-Sachs deformity (proximal humerus fracture) 03/2022 while in [place], seen at [place] [facility] followed by Dr Patsy Lager - referred to orthopedics. S/p shoulder surgery [facility]) [facility] orth[facility] in [place] 03/2022. On celebrex 100mg  BID since surgery.   H/o PCOS, insulin resistance and prediabetes. Was on Mounjaro until insurance stopped covering. Has not tolerated Metformin including XR in the past.   OSA followed by pulm - looking to get mini-CPAP machine with nasal pillow approved.  Preventative: Colon cancer screening - not due Well woman exam - with [facility]. S/p complete hysterectomy with BSO (10/2020).  Breast cancer screening - with [facility] - normal ~03/2022.  DEXA scan - not due Lung cancer screening - not eligible Flu shot -yearly COVID shot - Pfizer 02/2019, 05/2019, booster 10/2021 Tetanus shot - unsure Pneumonia shot - not due Shingrix - not due Seat belt use discussed Sunscreen use discussed. No changing moles on skin.  Sleep - averaging 8 hours/night - trouble sleeping recently due to shoulder pain  Non  smoker Alcohol - none Dentist - q6 mo  Eye exam - due     Relevant past medical, surgical, family and social history reviewed and updated as indicated. Interim medical history since our last visit reviewed. Allergies and medications reviewed and updated. Outpatient Medications Prior to Visit  Medication Sig Dispense Refill   albuterol (VENTOLIN HFA) 108 (90 Base) MCG/ACT inhaler Inhale 2 puffs into the lungs every 6 (six) hours as needed for wheezing or shortness of breath. 8 g 2   CELEBREX 100 MG capsule Take 100 mg by mouth 2 (two) times daily.     cyclobenzaprine (FLEXERIL) 10 MG tablet Take 10 mg by mouth at bedtime.     fluticasone-salmeterol (ADVAIR HFA) 230-21 MCG/ACT inhaler Inhale 2 puffs into the lungs 2 (two) times daily. 1 each 5   gabapentin (NEURONTIN) 100 MG capsule Take 100 mg by mouth 3 (three) times daily.     gabapentin (NEURONTIN) 600 MG tablet Take 600 mg by mouth daily.     methocarbamol (ROBAXIN) 500 MG tablet Take 500 mg by mouth 3 (three) times daily.     naproxen sodium (ALEVE) 220 MG tablet Take 440 mg by mouth 2 (two) times daily as needed (pain).     gabapentin (NEURONTIN) 300 MG capsule Take 300 mg by mouth at bedtime.     traMADol (ULTRAM) 50 MG tablet Take 1 tablet (50 mg total) by mouth every 8 (eight) hours as needed  for moderate pain. (Patient not taking: Reported on 05/24/2022) 20 tablet 0   No facility-administered medications prior to visit.     Per HPI unless specifically indicated in ROS section below Review of Systems  Constitutional:  Positive for appetite change (decreased). Negative for activity change, chills, fatigue, fever and unexpected weight change.  HENT:  Negative for hearing loss.   Eyes:  Negative for visual disturbance.  Respiratory:  Negative for cough, chest tightness, shortness of breath and wheezing.   Cardiovascular:  Negative for chest pain, palpitations and leg swelling.  Gastrointestinal:  Positive for abdominal pain (see  HPI). Negative for abdominal distention, blood in stool, constipation, diarrhea, nausea and vomiting.  Genitourinary:  Negative for difficulty urinating and hematuria.  Musculoskeletal:  Negative for arthralgias, myalgias and neck pain.  Skin:  Negative for rash.  Neurological:  Negative for dizziness, seizures, syncope and headaches.  Hematological:  Negative for adenopathy. Bruises/bleeds easily (celebrex related).  Psychiatric/Behavioral:  Negative for dysphoric mood. The patient is not nervous/anxious.     Objective:  BP 126/84   Pulse 97   Temp (!) 97.3 F (36.3 C) (Temporal)   Ht 5' 1.25" (1.556 m)   Wt 198 lb (89.8 kg)   LMP 09/29/2020 (Approximate) Comment: PCOS  SpO2 98%   BMI 37.11 kg/m   Wt Readings from Last 3 Encounters:  05/28/22 198 lb (89.8 kg)  05/24/22 200 lb (90.7 kg)  03/29/22 199 lb 8 oz (90.5 kg)      Physical Exam Vitals and nursing note reviewed.  Constitutional:      Appearance: Normal appearance. She is not ill-appearing.  HENT:     Head: Normocephalic and atraumatic.     Right Ear: Tympanic membrane, ear canal and external ear normal. There is no impacted cerumen.     Left Ear: Tympanic membrane, ear canal and external ear normal. There is no impacted cerumen.     Nose: Nose normal.     Mouth/Throat:     Mouth: Mucous membranes are moist.     Pharynx: Oropharynx is clear. No oropharyngeal exudate or posterior oropharyngeal erythema.  Eyes:     General:        Right eye: No discharge.        Left eye: No discharge.     Extraocular Movements: Extraocular movements intact.     Conjunctiva/sclera: Conjunctivae normal.     Pupils: Pupils are equal, round, and reactive to light.  Neck:     Thyroid: No thyroid mass or thyromegaly.  Cardiovascular:     Rate and Rhythm: Normal rate and regular rhythm.     Pulses: Normal pulses.     Heart sounds: Normal heart sounds. No murmur heard. Pulmonary:     Effort: Pulmonary effort is normal. No respiratory  distress.     Breath sounds: Normal breath sounds. No wheezing, rhonchi or rales.  Abdominal:     General: Bowel sounds are normal. There is no distension.     Palpations: Abdomen is soft. There is no mass.     Tenderness: There is no abdominal tenderness. There is no guarding or rebound.     Hernia: No hernia is present.  Musculoskeletal:     Cervical back: Normal range of motion and neck supple. No rigidity.     Right lower leg: No edema.     Left lower leg: No edema.  Lymphadenopathy:     Cervical: No cervical adenopathy.  Skin:    General: Skin is warm and  dry.     Findings: No rash.  Neurological:     General: No focal deficit present.     Mental Status: She is alert. Mental status is at baseline.  Psychiatric:        Mood and Affect: Mood normal.        Behavior: Behavior normal.       Lab Results  Component Value Date   CHOL 228 (H) 05/17/2022   HDL 44.90 05/17/2022   LDLCALC 151 (H) 05/17/2022   TRIG 162.0 (H) 05/17/2022   CHOLHDL 5 05/17/2022    Lab Results  Component Value Date   HGBA1C 5.5 02/26/2022    Assessment & Plan:   Problem List Items Addressed This Visit     Severe obesity (BMI 35.0-39.9) with comorbidity (HCC)    Encourage healthy diet and lifestyle choices to affect sustainable weight loss. Previously benefited from [place] but then insurance coverage dropped.       OSA (obstructive sleep apnea)    Followed  by pulm Continues working towards coverage for mini-CPAP with nasal pillow       History of nephrolithiasis    With recent abd/L lower back pain and microhematuria, she will call urology for f/u.       PCOS (polycystic ovarian syndrome)    Followed by GYN      Prediabetes    Latest A1c stable.       Asthma, moderate persistent, poorly-controlled    Appreciate pulm care - continue advair      Health maintenance examination - Primary (Chronic)    Preventative protocols reviewed and updated unless pt declined. Discussed  healthy diet and lifestyle.       Dyslipidemia    Chronic. Reviewed diet choices to control cholesterol levels. The 10-year ASCVD risk score (Arnett DK, et al., 2019) is: 1.2%   Values used to calculate the score:     Age: 53 years     Sex: Female     Is Non-Hispanic African American: No     Diabetic: No     Tobacco smoker: No     Systolic Blood Pressure: 126 mmHg     Is BP treated: No     HDL Cholesterol: 44.9 mg/dL     Total Cholesterol: 228 mg/dL       Vitamin D deficiency    Markedly low levels - start Rx vit D weekly for the next year.       Other Visit Diagnoses     Need for Tdap vaccination       Relevant Orders   Tdap vaccine greater than or equal to 7yo IM (Completed)        Meds ordered this encounter  Medications   Cholecalciferol 1.25 MG (50000 UT) capsule    Sig: Take 1 capsule (50,000 Units total) by mouth daily.    Dispense:  12 capsule    Refill:  4    Orders Placed This Encounter  Procedures   Tdap vaccine greater than or equal to 7yo IM    Patient Instructions  Call Dr Richardo Hanks to schedule follow up appointment.  Tdap today  Start vitamin D 50,000 units weekly - sent to pharmacy Good to see you today Return as needed or in 1 year for next physical.   Follow up plan: Return in about 1 year (around 05/28/2023) for annual exam, prior fasting for blood work.  Eustaquio Boyden, MD

## 2022-05-28 NOTE — Assessment & Plan Note (Signed)
Chronic. Reviewed diet choices to control cholesterol levels. The 10-year ASCVD risk score (Arnett DK, et al., 2019) is: 1.2%   Values used to calculate the score:     Age: 42 years     Sex: Female     Is Non-Hispanic African American: No     Diabetic: No     Tobacco smoker: No     Systolic Blood Pressure: 126 mmHg     Is BP treated: No     HDL Cholesterol: 44.9 mg/dL     Total Cholesterol: 228 mg/dL

## 2022-05-28 NOTE — Addendum Note (Signed)
Addended by: Nanci Pina on: 05/28/2022 12:06 PM   Modules accepted: Orders

## 2022-05-28 NOTE — Assessment & Plan Note (Signed)
Followed by GYN

## 2022-05-28 NOTE — Assessment & Plan Note (Addendum)
With recent abd/L lower back pain and microhematuria, she will call urology for f/u.

## 2022-05-28 NOTE — Patient Instructions (Addendum)
Call Dr Richardo Hanks to schedule follow up appointment.  Tdap today  Start vitamin D 50,000 units weekly - sent to pharmacy Good to see you today Return as needed or in 1 year for next physical.

## 2022-05-28 NOTE — Assessment & Plan Note (Signed)
Appreciate pulm care - continue advair

## 2022-05-28 NOTE — Assessment & Plan Note (Addendum)
Markedly low levels - start Rx vit D weekly for the next year.

## 2022-05-28 NOTE — Assessment & Plan Note (Signed)
Preventative protocols reviewed and updated unless pt declined. Discussed healthy diet and lifestyle.  

## 2022-05-28 NOTE — Assessment & Plan Note (Signed)
Latest A1c stable.

## 2022-05-28 NOTE — Assessment & Plan Note (Signed)
Encourage healthy diet and lifestyle choices to affect sustainable weight loss. Previously benefited from [place] but then insurance coverage dropped.

## 2022-05-28 NOTE — Assessment & Plan Note (Signed)
Followed  by pulm Continues working towards coverage for mini-CPAP with nasal pillow

## 2022-06-22 ENCOUNTER — Telehealth: Payer: Self-pay | Admitting: Urology

## 2022-06-23 ENCOUNTER — Ambulatory Visit: Payer: Self-pay | Admitting: Urology

## 2022-06-24 ENCOUNTER — Encounter: Payer: Self-pay | Admitting: Urology

## 2022-06-24 ENCOUNTER — Ambulatory Visit
Admission: RE | Admit: 2022-06-24 | Discharge: 2022-06-24 | Disposition: A | Discharge status: Home / Self Care | Payer: Commercial Managed Care - PPO | Source: Ambulatory Visit | Attending: Urology | Admitting: Urology

## 2022-06-24 ENCOUNTER — Ambulatory Visit: Payer: Self-pay | Admitting: Physician Assistant

## 2022-06-24 ENCOUNTER — Ambulatory Visit (INDEPENDENT_AMBULATORY_CARE_PROVIDER_SITE_OTHER): Payer: Commercial Managed Care - PPO | Admitting: Urology

## 2022-06-24 VITALS — BP 130/92 | HR 94 | Ht 61.0 in | Wt 200.0 lb

## 2022-06-24 DIAGNOSIS — R82998 Other abnormal findings in urine: Secondary | ICD-10-CM

## 2022-06-24 DIAGNOSIS — R1032 Left lower quadrant pain: Secondary | ICD-10-CM | POA: Diagnosis not present

## 2022-06-24 DIAGNOSIS — N390 Urinary tract infection, site not specified: Secondary | ICD-10-CM

## 2022-06-24 DIAGNOSIS — Z87442 Personal history of urinary calculi: Secondary | ICD-10-CM

## 2022-06-24 DIAGNOSIS — N200 Calculus of kidney: Secondary | ICD-10-CM

## 2022-06-24 LAB — MICROSCOPIC EXAMINATION: RBC, Urine: >30 /hpf — AB (ref 0–2)

## 2022-06-24 LAB — URINALYSIS, COMPLETE
Bilirubin, UA: NEGATIVE
Ketones, UA: NEGATIVE
Nitrite, UA: POSITIVE — AB
Specific Gravity, UA: >1.03 — ABNORMAL HIGH (ref 1.005–1.030)
Urobilinogen, Ur: 0.2 mg/dL (ref 0.2–1.0)
pH, UA: 5.5 (ref 5.0–7.5)

## 2022-06-24 MED ORDER — CIPROFLOXACIN HCL 500 MG PO TABS: 500.0000 mg | ORAL_TABLET | Freq: Two times a day (BID) | ORAL | 0 refills | Status: DC

## 2022-06-24 MED ORDER — POTASSIUM CITRATE ER 15 MEQ (1620 MG) PO TBCR: 2.0000 | EXTENDED_RELEASE_TABLET | Freq: Two times a day (BID) | ORAL | 3 refills | Status: DC

## 2022-06-24 NOTE — Progress Notes (Signed)
   06/24/2022 1:23 PM   Kayla Sexton [birth year] [id number]  Reason for visit: Left-sided flank and lower abdominal pain  HPI: 42 year old female with recurrent uric acid nephrolithiasis, requiring multiple ureteroscopy in the past, most recently October 2022.  She reports 1 to 2 weeks of left lower abdominal and low back pain of unclear etiology.  She had a urinalysis with PCP on 05/24/2022 that showed 6-10 WBC, 10-20 RBC, no bacteria, nitrite negative, culture was negative, KUB showed no abnormalities.  She denies any dysuria or UTI symptoms, denies any fevers or chills.  Urinalysis today 6-10 WBC, greater than 30 RBC, moderate bacteria, trace leukocytes, nitrite positive, pH 5.5.  Will send for culture  Using shared decision making, we opted to order a stat CT today.  I personally viewed and interpreted those images that show multiple left renal stones but no hydronephrosis or ureteral stones.  Largest left renal stone measures 1.2 cm. Hu<500 and urine pH 5.5 consistent with uric acid stones.  She previously had been started on potassium citrate, but discontinued that medication.    We had a long conversation about options today.  Urinalysis does appear infected, and I recommended Cipro 500 mg twice daily x 5 days for possible UTI/early pyelonephritis.  No evidence of obstructing ureteral stones or hydronephrosis that would warrant urgent stent placement at this time, and clinically she appears well.  She would like to avoid any surgery for her stones which I think is very reasonable, especially since she has not tolerated stents in the past.  I think it is very reasonable to proceed with a trial of dissolution therapy with potassium citrate 30 mEq twice daily.  Risk and benefits discussed at length, return precautions reviewed.  -Cipro 500 mg twice daily x 5 days for suspected UTI, follow-up culture -Trial of potassium citrate 30 mEq twice daily for dissolution therapy of nonobstructive left  renal stones.  Consider transition to litholyte in the future if not tolerating potassium citrate -RTC 8 weeks symptom check, UA for pH check, consider repeat CT at that time for stone monitoring  Sondra Come, MD  [facility] [address] [place], [place] [address] [phone]

## 2022-06-25 NOTE — Telephone Encounter (Signed)
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 I asked if pt picked up medications per pt not yet. I explained that her urine is infected and it would need to be clear before anything can be done. Pt understood and will start on the abx today. Pt is going to her job today to see how long she can be out, since she is already out for shoulder pain and hoping to extend her leave

## 2022-06-28 ENCOUNTER — Telehealth: Payer: Self-pay

## 2022-06-28 ENCOUNTER — Other Ambulatory Visit: Payer: Self-pay | Admitting: Urology

## 2022-06-28 DIAGNOSIS — N200 Calculus of kidney: Secondary | ICD-10-CM

## 2022-06-28 NOTE — Telephone Encounter (Signed)
Pt calling again stating she's in excruciating pain and needs something done. Pt states she's been taking UTI medication for 4 days and it's not easy up. I advised we are still waiting on the final results of the urine culture and that we may need to change medications. Pt also states the wants surgery to remove the stone due to it moving around. Pt states the pain is in her lower abd in her "stomach" pt states she needs to get back to work but she can't stand up for more than 3 minutes. Please advise

## 2022-06-28 NOTE — Telephone Encounter (Signed)
I spoke with Kayla Sexton. We have discussed possible surgery dates and Friday June 7th, 2024 was agreed upon by all parties. Patient given information about surgery date, what to expect pre-operatively and post operatively.  We discussed that a Pre-Admission Testing office will be calling to set up the pre-op visit that will take place prior to surgery, and that these appointments are typically done over the phone with a Pre-Admissions RN. Informed patient that our office will communicate any additional care to be provided after surgery. Patients questions or concerns were discussed during our call. Advised to call our office should there be any additional information, questions or concerns that arise. Patient verbalized understanding.

## 2022-06-28 NOTE — Progress Notes (Signed)
Surgical Physician Order [facility] [place]  * Scheduling expectation :  Friday 6/7  *Length of Case: 1 hour  *Clearance needed: no  *Anticoagulation Instructions: May continue all anticoagulants  *Aspirin Instructions: Ok to continue all  *Post-op visit Date/Instructions:   tbd  *Diagnosis: Left Nephrolithiasis  *Procedure: left Ureteroscopy w/laser lithotripsy & stent placement (82956)   Additional orders: N/A  -Admit type: OUTpatient  -Anesthesia: General  -VTE Prophylaxis Standing Order SCD's       Other:   -Standing Lab Orders Per Anesthesia    Lab other: None  -Standing Test orders EKG/Chest x-ray per Anesthesia       Test other:   - Medications:  Ancef 2gm IV  -Other orders:  N/A

## 2022-06-28 NOTE — Progress Notes (Signed)
   [facility] Surgical Posting Form  Surgery Date: Date: 07/02/2022  Surgeon: Dr. Legrand Rams, MD  Inpt ( No  )   Outpt (Yes)   Obs ( No  )   Diagnosis: N20.0 Left Nephrolithiasis  -CPT: [phone]  Surgery: Left Ureteroscopy with Laser Lithotripsy and Stent Placement   Stop Anticoagulations: No, may continue all  Cardiac/Medical/Pulmonary Clearance needed: no  *Orders entered into EPIC  Date: 06/28/22   *Case booked in EPIC  Date: 06/28/22  *Notified pt of Surgery: Date: 06/28/22  PRE-OP UA & CX: no  *Placed into Prior Authorization Work [place] Date: 06/28/22  Assistant/laser/rep:No

## 2022-06-29 LAB — CULTURE, URINE COMPREHENSIVE

## 2022-06-30 ENCOUNTER — Encounter
Admission: RE | Admit: 2022-06-30 | Discharge: 2022-06-30 | Disposition: A | Discharge status: Home / Self Care | Payer: Commercial Managed Care - PPO | Source: Ambulatory Visit | Attending: Urology | Admitting: Urology

## 2022-06-30 ENCOUNTER — Other Ambulatory Visit: Payer: Self-pay

## 2022-06-30 HISTORY — DX: Unspecified asthma, uncomplicated: J45.909

## 2022-06-30 NOTE — Patient Instructions (Addendum)
Your procedure is scheduled on: 07/02/22 - Friday Report to the Registration Desk on the 1st floor of the [facility]. To find out your arrival time, please call [phone] between 1PM - 3PM on: 07/01/22 - Thursday If your arrival time is 6:00 am, do not arrive before that time as the [facility] entrance doors do not open until 6:00 am.  REMEMBER: Instructions that are not followed completely may result in serious medical risk, up to and including death; or upon the discretion of your surgeon and anesthesiologist your surgery may need to be rescheduled.  Do not eat food or drink any liquids after midnight the night before surgery.  No gum chewing or hard candies.  One week prior to surgery: Stop Anti-inflammatories (NSAIDS) such as Advil, Aleve, Ibuprofen, Motrin, Naproxen, Naprosyn and Aspirin based products such as Excedrin, Goody's Powder, BC Powder.  Stop ANY OVER THE COUNTER supplements until after surgery.  You may take Tylenol if needed for pain up until the day of surgery.   TAKE ONLY THESE MEDICATIONS THE MORNING OF SURGERY WITH A SIP OF WATER:  gabapentin (NEURONTIN)  methocarbamol (ROBAXIN)  Potassium Citrate   Use albuterol (VENTOLIN HFA)  on the day of surgery and bring to the hospital.  No Alcohol for 24 hours before or after surgery.  No Smoking including e-cigarettes for 24 hours before surgery.  No chewable tobacco products for at least 6 hours before surgery.  No nicotine patches on the day of surgery.  Do not use any "recreational" drugs for at least a week (preferably 2 weeks) before your surgery.  Please be advised that the combination of cocaine and anesthesia may have negative outcomes, up to and including death. If you test positive for cocaine, your surgery will be cancelled.  On the morning of surgery brush your teeth with toothpaste and water, you may rinse your mouth with mouthwash if you wish. Do not swallow any toothpaste or mouthwash.  Do  not wear jewelry, make-up, hairpins, clips or nail polish.  Do not wear lotions, powders, or perfumes.   Do not shave body hair from the neck down 48 hours before surgery.  Contact lenses, hearing aids and dentures may not be worn into surgery.  Do not bring valuables to the hospital. [facility] is not responsible for any missing/lost belongings or valuables.   Notify your doctor if there is any change in your medical condition (cold, fever, infection).  Wear comfortable clothing (specific to your surgery type) to the hospital.  After surgery, you can help prevent lung complications by doing breathing exercises.  Take deep breaths and cough every 1-2 hours. Your doctor may order a device called an Incentive Spirometer to help you take deep breaths. When coughing or sneezing, hold a pillow firmly against your incision with both hands. This is called "splinting." Doing this helps protect your incision. It also decreases belly discomfort.  If you are being admitted to the hospital overnight, leave your suitcase in the car. After surgery it may be brought to your room.  In case of increased patient census, it may be necessary for you, the patient, to continue your postoperative care in the Same Day Surgery department.  If you are being discharged the day of surgery, you will not be allowed to drive home. You will need a responsible individual to drive you home and stay with you for 24 hours after surgery.   If you are taking public transportation, you will need to have a  responsible individual with you.  Please call the Pre-admissions Testing Dept. at [phone] if you have any questions about these instructions.  Surgery Visitation Policy:  Patients having surgery or a procedure may have two visitors.  Children under the age of 39 must have an adult with them who is not the patient.  Inpatient Visitation:    Visiting hours are 7 a.m. to 8 p.m. Up to four visitors are allowed at  one time in a patient room. The visitors may rotate out with other people during the day.  One visitor age 55 or older may stay with the patient overnight and must be in the room by 8 p.m.

## 2022-07-01 MED ORDER — FAMOTIDINE 20 MG PO TABS
20.0000 mg | ORAL_TABLET | Freq: Once | ORAL | Status: AC
  Administered 2022-07-02: 20 mg via ORAL

## 2022-07-01 MED ORDER — LACTATED RINGERS IV SOLN: INTRAVENOUS | Status: DC

## 2022-07-01 MED ORDER — CEFAZOLIN SODIUM-DEXTROSE 2-4 GM/100ML-% IV SOLN
2.0000 g | INTRAVENOUS | Status: AC
  Administered 2022-07-02: 2 g via INTRAVENOUS

## 2022-07-01 MED ORDER — ORAL CARE MOUTH RINSE: 15.0000 mL | Freq: Once | OROMUCOSAL | Status: AC

## 2022-07-01 MED ORDER — CHLORHEXIDINE GLUCONATE 0.12 % MT SOLN
15.0000 mL | Freq: Once | OROMUCOSAL | Status: AC
  Administered 2022-07-02: 15 mL via OROMUCOSAL

## 2022-07-02 ENCOUNTER — Other Ambulatory Visit: Payer: Self-pay

## 2022-07-02 ENCOUNTER — Ambulatory Visit
Admission: RE | Admit: 2022-07-02 | Discharge: 2022-07-02 | Disposition: A | Discharge status: Home / Self Care | Payer: Commercial Managed Care - PPO | Attending: Urology | Admitting: Urology

## 2022-07-02 ENCOUNTER — Ambulatory Visit: Payer: Commercial Managed Care - PPO | Admitting: Anesthesiology

## 2022-07-02 ENCOUNTER — Encounter: Payer: Self-pay | Admitting: Urology

## 2022-07-02 ENCOUNTER — Ambulatory Visit: Payer: Commercial Managed Care - PPO

## 2022-07-02 ENCOUNTER — Encounter
Admission: RE | Disposition: A | Discharge status: Home / Self Care | Payer: Self-pay | Source: Home / Self Care | Attending: Urology

## 2022-07-02 DIAGNOSIS — N200 Calculus of kidney: Secondary | ICD-10-CM | POA: Diagnosis present

## 2022-07-02 HISTORY — PX: CYSTOSCOPY/URETEROSCOPY/HOLMIUM LASER/STENT PLACEMENT: SHX6546

## 2022-07-02 SURGERY — CYSTOSCOPY/URETEROSCOPY/HOLMIUM LASER/STENT PLACEMENT
Anesthesia: General | Laterality: Left

## 2022-07-02 MED ORDER — KETOROLAC TROMETHAMINE 30 MG/ML IJ SOLN
INTRAMUSCULAR | Status: DC | PRN
  Administered 2022-07-02: 15 mg via INTRAVENOUS

## 2022-07-02 MED ORDER — HYDROCODONE-ACETAMINOPHEN 5-325 MG PO TABS
ORAL_TABLET | ORAL | Status: AC
  Filled 2022-07-02: qty 1

## 2022-07-02 MED ORDER — ROCURONIUM BROMIDE 100 MG/10ML IV SOLN
INTRAVENOUS | Status: DC | PRN
  Administered 2022-07-02: 20 mg via INTRAVENOUS

## 2022-07-02 MED ORDER — PROPOFOL 500 MG/50ML IV EMUL
INTRAVENOUS | Status: DC | PRN
  Administered 2022-07-02: 75 ug/kg/min via INTRAVENOUS

## 2022-07-02 MED ORDER — DEXAMETHASONE SODIUM PHOSPHATE 10 MG/ML IJ SOLN
INTRAMUSCULAR | Status: DC | PRN
  Administered 2022-07-02: 10 mg via INTRAVENOUS

## 2022-07-02 MED ORDER — DROPERIDOL 2.5 MG/ML IJ SOLN: 0.6250 mg | Freq: Once | INTRAMUSCULAR | Status: DC | PRN

## 2022-07-02 MED ORDER — PROPOFOL 10 MG/ML IV BOLUS
INTRAVENOUS | Status: AC
  Filled 2022-07-02: qty 20

## 2022-07-02 MED ORDER — MIDAZOLAM HCL 2 MG/2ML IJ SOLN
INTRAMUSCULAR | Status: DC | PRN
  Administered 2022-07-02: 2 mg via INTRAVENOUS

## 2022-07-02 MED ORDER — PROPOFOL 10 MG/ML IV BOLUS
INTRAVENOUS | Status: DC | PRN
  Administered 2022-07-02: 200 mg via INTRAVENOUS

## 2022-07-02 MED ORDER — ROCURONIUM BROMIDE 10 MG/ML (PF) SYRINGE
PREFILLED_SYRINGE | INTRAVENOUS | Status: AC
  Filled 2022-07-02: qty 10

## 2022-07-02 MED ORDER — MIDAZOLAM HCL 2 MG/2ML IJ SOLN
INTRAMUSCULAR | Status: AC
  Filled 2022-07-02: qty 2

## 2022-07-02 MED ORDER — SOD CITRATE-CITRIC ACID 500-334 MG/5ML PO SOLN
30.0000 mL | Freq: Once | ORAL | Status: AC
  Administered 2022-07-02: 30 mL via ORAL
  Filled 2022-07-02: qty 30

## 2022-07-02 MED ORDER — HYDROCODONE-ACETAMINOPHEN 5-325 MG PO TABS: 1.0000 | ORAL_TABLET | Freq: Four times a day (QID) | ORAL | 0 refills | Status: AC | PRN

## 2022-07-02 MED ORDER — LIDOCAINE HCL (PF) 2 % IJ SOLN
INTRAMUSCULAR | Status: AC
  Filled 2022-07-02: qty 5

## 2022-07-02 MED ORDER — LIDOCAINE HCL (CARDIAC) PF 100 MG/5ML IV SOSY: PREFILLED_SYRINGE | INTRAVENOUS | Status: DC | PRN

## 2022-07-02 MED ORDER — CEFAZOLIN SODIUM-DEXTROSE 2-4 GM/100ML-% IV SOLN
INTRAVENOUS | Status: AC
  Filled 2022-07-02: qty 100

## 2022-07-02 MED ORDER — DEXMEDETOMIDINE HCL IN NACL 80 MCG/20ML IV SOLN
INTRAVENOUS | Status: DC | PRN
  Administered 2022-07-02: 12 ug via INTRAVENOUS

## 2022-07-02 MED ORDER — SODIUM CHLORIDE 0.9 % IR SOLN
Status: DC | PRN
  Administered 2022-07-02: 3000 mL via INTRAVESICAL

## 2022-07-02 MED ORDER — IOHEXOL 180 MG/ML  SOLN
INTRAMUSCULAR | Status: DC | PRN
  Administered 2022-07-02: 10 mL

## 2022-07-02 MED ORDER — HYDROCODONE-ACETAMINOPHEN 5-325 MG PO TABS
1.0000 | ORAL_TABLET | Freq: Four times a day (QID) | ORAL | Status: DC | PRN
  Administered 2022-07-02: 1 via ORAL

## 2022-07-02 MED ORDER — ONDANSETRON HCL 4 MG/2ML IJ SOLN
INTRAMUSCULAR | Status: AC
  Filled 2022-07-02: qty 2

## 2022-07-02 MED ORDER — FENTANYL CITRATE (PF) 100 MCG/2ML IJ SOLN: 25.0000 ug | INTRAMUSCULAR | Status: DC | PRN

## 2022-07-02 MED ORDER — FENTANYL CITRATE (PF) 100 MCG/2ML IJ SOLN
INTRAMUSCULAR | Status: AC
  Filled 2022-07-02: qty 2

## 2022-07-02 MED ORDER — FAMOTIDINE 20 MG PO TABS
ORAL_TABLET | ORAL | Status: AC
  Filled 2022-07-02: qty 1

## 2022-07-02 MED ORDER — SUCCINYLCHOLINE CHLORIDE 200 MG/10ML IV SOSY
PREFILLED_SYRINGE | INTRAVENOUS | Status: DC | PRN
  Administered 2022-07-02: 100 mg via INTRAVENOUS

## 2022-07-02 MED ORDER — LIDOCAINE HCL (CARDIAC) PF 100 MG/5ML IV SOSY
PREFILLED_SYRINGE | INTRAVENOUS | Status: DC | PRN
  Administered 2022-07-02: 100 mg via INTRAVENOUS

## 2022-07-02 MED ORDER — ONDANSETRON HCL 4 MG/2ML IJ SOLN
INTRAMUSCULAR | Status: DC | PRN
  Administered 2022-07-02: 4 mg via INTRAVENOUS

## 2022-07-02 MED ORDER — CHLORHEXIDINE GLUCONATE 0.12 % MT SOLN
OROMUCOSAL | Status: AC
  Filled 2022-07-02: qty 15

## 2022-07-02 MED ORDER — FENTANYL CITRATE (PF) 100 MCG/2ML IJ SOLN
INTRAMUSCULAR | Status: DC | PRN
  Administered 2022-07-02 (×2): 50 ug via INTRAVENOUS

## 2022-07-02 MED ORDER — DEXAMETHASONE SODIUM PHOSPHATE 10 MG/ML IJ SOLN
INTRAMUSCULAR | Status: AC
  Filled 2022-07-02: qty 1

## 2022-07-02 MED ORDER — SUGAMMADEX SODIUM 200 MG/2ML IV SOLN
INTRAVENOUS | Status: DC | PRN
  Administered 2022-07-02: 200 mg via INTRAVENOUS

## 2022-07-02 SURGICAL SUPPLY — 20 items
BAG DRAIN SIEMENS DORNER NS (MISCELLANEOUS) ×2 IMPLANT
BAG DRN NS LF (MISCELLANEOUS) ×1
BAG PRESSURE INF REUSE 3000 (BAG) ×2 IMPLANT
BRUSH SCRUB EZ 1% IODOPHOR (MISCELLANEOUS) ×2 IMPLANT
DRAPE UTILITY 15X26 TOWEL STRL (DRAPES) ×2 IMPLANT
FIBER LASER MOSES 365 DFL (Laser) IMPLANT
GLOVE BIOGEL PI IND STRL 7.5 (GLOVE) ×2 IMPLANT
GOWN STRL REUS W/ TWL LRG LVL3 (GOWN DISPOSABLE) ×2 IMPLANT
GOWN STRL REUS W/ TWL XL LVL3 (GOWN DISPOSABLE) ×2 IMPLANT
GOWN STRL REUS W/TWL LRG LVL3 (GOWN DISPOSABLE) ×1
GOWN STRL REUS W/TWL XL LVL3 (GOWN DISPOSABLE) ×1
GUIDEWIRE STR DUAL SENSOR (WIRE) ×2 IMPLANT
IV NS IRRIG 3000ML ARTHROMATIC (IV SOLUTION) ×2 IMPLANT
KIT TURNOVER CYSTO (KITS) ×2 IMPLANT
PACK CYSTO AR (MISCELLANEOUS) ×2 IMPLANT
SET CYSTO W/LG BORE CLAMP LF (SET/KITS/TRAYS/PACK) ×2 IMPLANT
SURGILUBE 2OZ TUBE FLIPTOP (MISCELLANEOUS) ×2 IMPLANT
SYR 10ML LL (SYRINGE) ×2 IMPLANT
VALVE UROSEAL ADJ ENDO (VALVE) IMPLANT
WATER STERILE IRR 500ML POUR (IV SOLUTION) ×2 IMPLANT

## 2022-07-02 NOTE — Op Note (Signed)
Date of procedure: 07/02/22  Preoperative diagnosis:  Left renal stones  Postoperative diagnosis:  Same  Procedure: Cystoscopy, left ureteroscopy, laser lithotripsy, left retrograde pyelogram with intraoperative interpretation  Surgeon: Legrand Rams, MD  Anesthesia: General  Complications: None  Intraoperative findings:  Normal cystoscopy Uncomplicated dusting of multiple left renal stones, largest 1 cm Excellent efflux at conclusion of procedure, no stent placed in the setting of good drainage and patient has not tolerated ureteral stents previously  EBL: None  Specimens: None  Drains: None  Indication: Kayla Sexton is a 42 y.o. patient with recurrent uric acid nephrolithiasis, multiple left renal stones, who opted for ureteroscopy.  After reviewing the management options for treatment, they elected to proceed with the above surgical procedure(s). We have discussed the potential benefits and risks of the procedure, side effects of the proposed treatment, the likelihood of the patient achieving the goals of the procedure, and any potential problems that might occur during the procedure or recuperation. Informed consent has been obtained.  Description of procedure:  The patient was taken to the operating room and general anesthesia was induced. SCDs were placed for DVT prophylaxis. The patient was placed in the dorsal lithotomy position, prepped and draped in the usual sterile fashion, and preoperative antibiotics(Ancef) were administered. A preoperative time-out was performed.   A 21 French rigid cystoscope was used to intubate the urethra and thorough cystoscopy was performed.  The bladder was grossly normal, and the ureteral orifices orthotopic bilaterally.  Stones were not visible on fluoroscopy consistent with uric acid stones.  Sensor wire advanced easily up to the left kidney under fluoroscopic vision.  A digital single-channel flexible ureteroscope advanced easily over  the wire up to the kidney under fluoroscopic vision.  Multiple yellow stones that appear to be uric acid were identified within the renal pelvis, the largest measuring 1 cm.  A 360 m laser fiber on settings of 0.5 J and 80 Hz was used to methodically dust the stone.  Thorough pyeloscopy showed no significant stone fragments at the conclusion of the procedure.  A retrograde pyelogram performed from the proximal ureter showed no extravasation or filling defects.  Careful pullback ureteroscopy showed no ureteral injury or ureteral fragments.  The rigid cystoscope was reinserted and there was brisk efflux of pink fluid from the left ureteral orifice, and on fluoroscopy contrast appeared to drain well.  No stent was placed, she has not tolerated ureteral stents previously.  The bladder was drained and this concluded our procedure.  Disposition: Stable to PACU  Plan: Continue potassium citrate, follow-up with PA in 6 weeks for UA for pH check  Legrand Rams, MD

## 2022-07-02 NOTE — Anesthesia Preprocedure Evaluation (Signed)
Anesthesia Evaluation  Patient identified by MRN, date of birth, ID band Patient awake    Reviewed: Allergy & Precautions, NPO status , Patient's Chart, lab work & pertinent test results, reviewed documented beta blocker date and time   History of Anesthesia Complications (+) PONV, Family history of anesthesia reaction and history of anesthetic complications  Airway Mallampati: III  TM Distance: >3 FB     Dental  (+) Chipped, Dental Advidsory Given   Pulmonary neg shortness of breath, asthma , sleep apnea (Waiting on CPAP) , neg COPD, neg recent URI   Pulmonary exam normal        Cardiovascular negative cardio ROS Normal cardiovascular exam     Neuro/Psych  Headaches, neg Seizures PSYCHIATRIC DISORDERS Anxiety      Neuromuscular disease    GI/Hepatic Neg liver ROS, hiatal hernia,GERD  Controlled,,  Endo/Other  neg diabetes  Morbid obesity  Renal/GU Renal disease (kidney stones)     Musculoskeletal   Abdominal   Peds  Hematology  (+) Blood dyscrasia, anemia   Anesthesia Other Findings Anemia    Cancer (HCC) 2006 CERVICAL   CIN III (cervical intraepithelial neoplasia III)    Dysuria    Family history of adverse reaction to anesthesia  DAD AND BROTHER AND MOM-N/V  GERD (gastroesophageal reflux disease)  OCC  Hematuria    HH (hiatus hernia)    History of kidney stones   Migraines    PCOS PONV HARD TO WAKE UP  Right ureteral calculus   Sleep apnea       Reproductive/Obstetrics negative OB ROS                             Anesthesia Physical Anesthesia Plan  ASA: 3  Anesthesia Plan: General   Post-op Pain Management:    Induction: Intravenous  PONV Risk Score and Plan: 4 or greater and Propofol infusion, TIVA and Midazolam  Airway Management Planned: Oral ETT  Additional Equipment:   Intra-op Plan:   Post-operative Plan: Extubation in OR  Informed Consent: I have  reviewed the patients History and Physical, chart, labs and discussed the procedure including the risks, benefits and alternatives for the proposed anesthesia with the patient or authorized representative who has indicated his/her understanding and acceptance.       Plan Discussed with: CRNA, Anesthesiologist and Surgeon  Anesthesia Plan Comments:         Anesthesia Quick Evaluation

## 2022-07-02 NOTE — Anesthesia Procedure Notes (Signed)
Procedure Name: Intubation Date/Time: 07/02/2022 1:43 PM  Performed by: Danelle Berry, CRNAPre-anesthesia Checklist: Patient identified, Emergency Drugs available, Suction available and Patient being monitored Patient Re-evaluated:Patient Re-evaluated prior to induction Oxygen Delivery Method: Circle system utilized Preoxygenation: Pre-oxygenation with 100% oxygen Induction Type: IV induction Ventilation: Mask ventilation without difficulty Tube type: Oral Tube size: 7.0 mm Number of attempts: 1 Airway Equipment and Method: Stylet and Oral airway Placement Confirmation: ETT inserted through vocal cords under direct vision, positive ETCO2 and breath sounds checked- equal and bilateral Secured at: 20 cm Tube secured with: Tape Dental Injury: Teeth and Oropharynx as per pre-operative assessment

## 2022-07-02 NOTE — Discharge Instructions (Signed)

## 2022-07-02 NOTE — Transfer of Care (Signed)
Immediate Anesthesia Transfer of Care Note  Patient: Kayla Sexton  Procedure(s) Performed: CYSTOSCOPY/URETEROSCOPY/HOLMIUM LASER FOR STONE (Left)  Patient Location: PACU  Anesthesia Type:General  Level of Consciousness: sedated  Airway & Oxygen Therapy: Patient Spontanous Breathing and Patient connected to face mask  Post-op Assessment: Report given to RN and Post -op Vital signs reviewed and stable  Post vital signs: Reviewed and stable  Last Vitals:  Vitals Value Taken Time  BP 132/91 07/02/22 1420  Temp    Pulse 96 07/02/22 1424  Resp 23 07/02/22 1424  SpO2 100 % 07/02/22 1424  Vitals shown include unvalidated device data.  Last Pain:  Vitals:   07/02/22 1154  TempSrc: Temporal  PainSc: 6          Complications: No notable events documented.

## 2022-07-02 NOTE — H&P (Signed)
07/02/22 1:06 PM   Kayla Sexton 12-02-1980 [id number]  CC: Left renal stones, left flank pain  HPI: 42 year old female with recurrent uric acid nephrolithiasis, noncompliant with potassium citrate in the past, who presents with left-sided flank pain.  CT showed multiple left renal stones but no hydronephrosis.  I had recommended a trial of dissolution therapy with potassium citrate, but she had ongoing flank pain and requested to proceed with left ureteroscopy and laser lithotripsy.  She has not tolerated stents well in the past.  Urine culture negative.   PMH: Past Medical History:  Diagnosis Date   Anemia    Asthma    Cancer (HCC) 2006   CERVICAL    CIN III (cervical intraepithelial neoplasia III)    Dysuria    Family history of adverse reaction to anesthesia    DAD AND BROTHER AND MOM-N/V   GERD (gastroesophageal reflux disease)    OCC   Hematuria    HH (hiatus hernia)    History of cervical dysplasia 2006   CIN1 and CIN2   History of kidney stones    Migraines    PCOS (polycystic ovarian syndrome)    Dr. Vincente Poli   PONV (postoperative nausea and vomiting)    HARD TO WAKE UP   Right ureteral calculus    Sleep apnea    DOES NOT USE CPAP    Surgical History: Past Surgical History:  Procedure Laterality Date   CERVICAL BIOPSY  W/ LOOP ELECTRODE EXCISION  12-04-2004   dr Vincente Poli  [facility]   COLONOSCOPY  03/2008   Dr. Bosie Clos   COMBINED HYSTEROSCOPY DIAGNOSTIC / D&C  02-28-2012   dr Vincente Poli   @ [facility]   CYSTOSCOPY W/ RETROGRADES Right 03/31/2018   Procedure: CYSTOSCOPY WITH RETROGRADE PYELOGRAM;  Surgeon: Sondra Come, MD;  Location: [facility];  Service: Urology;  Laterality: Right;   CYSTOSCOPY W/ RETROGRADES  11/11/2020   Procedure: CYSTOSCOPY WITH RETROGRADE PYELOGRAM;  Surgeon: Sondra Come, MD;  Location: [facility];  Service: Urology;;   CYSTOSCOPY WITH RETROGRADE PYELOGRAM, URETEROSCOPY AND STENT PLACEMENT Bilateral 07/21/2017   Procedure: CYSTOSCOPY WITH  BILATERAL  RETROGRADE BILATERAL URETEROSCOPY AND STENTS  PLACEMENT;  Surgeon: Bjorn Pippin, MD;  Location: [facility];  Service: Urology;  Laterality: Bilateral;   CYSTOSCOPY WITH RETROGRADE URETHROGRAM Left 01/04/2020   Procedure: CYSTOSCOPY WITH RETROGRADE URETHROGRAM;  Surgeon: Sondra Come, MD;  Location: [facility];  Service: Urology;  Laterality: Left;   CYSTOSCOPY/URETEROSCOPY/HOLMIUM LASER/STENT PLACEMENT Left 03/17/2018   Procedure: CYSTOSCOPY/URETEROSCOPY/HOLMIUM LASER/STENT PLACEMENT;  Surgeon: Sondra Come, MD;  Location: [facility];  Service: Urology;  Laterality: Left;   CYSTOSCOPY/URETEROSCOPY/HOLMIUM LASER/STENT PLACEMENT Right 03/31/2018   Procedure: CYSTOSCOPY/URETEROSCOPY/HOLMIUM LASER/STENT PLACEMENT;  Surgeon: Sondra Come, MD;  Location: [facility];  Service: Urology;  Laterality: Right;   CYSTOSCOPY/URETEROSCOPY/HOLMIUM LASER/STENT PLACEMENT Left 01/04/2020   Procedure: CYSTOSCOPY/URETEROSCOPY/HOLMIUM LASER;  Surgeon: Sondra Come, MD;  Location: [facility];  Service: Urology;  Laterality: Left;   CYSTOSCOPY/URETEROSCOPY/HOLMIUM LASER/STENT PLACEMENT Left 11/11/2020   Procedure: CYSTOSCOPY/URETEROSCOPY/HOLMIUM LASER;  Surgeon: Sondra Come, MD;  Location: [facility];  Service: Urology;  Laterality: Left;   DILATION AND CURETTAGE OF UTERUS     endoscopy  2010   Dr. Madilyn Fireman   EXTRACORPOREAL SHOCK WAVE LITHOTRIPSY  08/2021   FINGER SURGERY Right    MIDDLE FINGER   FOOT SURGERY Left 2015   5th bunionectomy w/ 2nd digit w/ fusion , retained hardware w/ 5th toe   FRACTURE SURGERY  left shoulder   HOLMIUM LASER APPLICATION Bilateral 07/21/2017   Procedure: HOLMIUM LASER APPLICATION;  Surgeon: Bjorn Pippin, MD;  [facility];  Service: Urology;  Laterality: Bilateral;     Family History: Family History  Problem Relation Age of Onset   Healthy Mother    Healthy Father    Breast cancer Paternal Grandmother    Breast cancer  Other        Maternal Great Aunt    Social History:  reports that she has never smoked. She has never been exposed to tobacco smoke. She has never used smokeless tobacco. She reports that she does not drink alcohol and does not use drugs.  Physical Exam: BP (!) 146/98   Pulse (!) 105   Temp 97.8 F (36.6 C) (Temporal)   Resp 16   Ht 5\' 1"  (1.549 m)   Wt 90.7 kg   LMP 09/29/2020 (Approximate) Comment: PCOS  SpO2 97%   BMI 37.79 kg/m    Constitutional:  Alert and oriented, No acute distress. Cardiovascular: Regular rate and rhythm Respiratory: Clear to auscultation bilaterally GI: Abdomen is soft, nontender, nondistended, no abdominal masses   Laboratory Data: Culture 5/30 mixed flora 5K  Assessment & Plan:   42 year old female with recurrent uric acid nephrolithiasis, multiple left renal stones and ongoing left flank pain she attributes to the stones.  We discussed at length possible other etiologies of her flank pain despite treatment of her stones.  Also strongly recommended resuming potassium citrate for uric acid stone prevention.  We specifically discussed the risks ureteroscopy including bleeding, infection/sepsis, stent related symptoms including flank pain/urgency/frequency/incontinence/dysuria, ureteral injury, inability to access stone, or need for staged or additional procedures.  Left ureteroscopy, laser lithotripsy, possible stent placement   Legrand Rams, MD 07/02/2022  [facility] [address] [place], [place] [address] [phone]

## 2022-07-05 ENCOUNTER — Encounter: Payer: Self-pay | Admitting: Urology

## 2022-07-13 NOTE — Anesthesia Postprocedure Evaluation (Signed)
Anesthesia Post Note  Patient: Kayla Sexton  Procedure(s) Performed: CYSTOSCOPY/URETEROSCOPY/HOLMIUM LASER FOR STONE (Left)  Patient location during evaluation: PACU Anesthesia Type: General Level of consciousness: awake and alert Pain management: pain level controlled Vital Signs Assessment: post-procedure vital signs reviewed and stable Respiratory status: spontaneous breathing, nonlabored ventilation, respiratory function stable and patient connected to nasal cannula oxygen Cardiovascular status: blood pressure returned to baseline and stable Postop Assessment: no apparent nausea or vomiting Anesthetic complications: no   No notable events documented.   Last Vitals:  Vitals:   07/02/22 1430 07/02/22 1452  BP: (!) 126/90 (!) 134/97  Pulse: 94 92  Resp: (!) 7 16  Temp: 36.7 C 36.9 C  SpO2: 99% 97%    Last Pain:  Vitals:   07/02/22 1452  TempSrc: Temporal  PainSc: 9                  Lenard Simmer

## 2022-07-26 ENCOUNTER — Ambulatory Visit
Admission: EM | Admit: 2022-07-26 | Discharge: 2022-07-26 | Disposition: A | Discharge status: Home / Self Care | Payer: Commercial Managed Care - PPO | Attending: Family Medicine | Admitting: Family Medicine

## 2022-07-26 DIAGNOSIS — H1032 Unspecified acute conjunctivitis, left eye: Secondary | ICD-10-CM | POA: Diagnosis not present

## 2022-07-26 MED ORDER — GENTAMICIN SULFATE 0.3 % OP SOLN: 2.0000 [drp] | Freq: Three times a day (TID) | OPHTHALMIC | 0 refills | Status: AC

## 2022-07-26 NOTE — ED Triage Notes (Signed)
Pt reports her left eye is painful, red, and has discharge x 1 day.  Pt reports at the top of her eye she sees black and green.

## 2022-07-26 NOTE — Discharge Instructions (Signed)
Put gentamicin eyedrops in the affected eye(s) 3 times daily for 5 days.  Cool compresses can help how the eye feels   
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
## 2022-08-04 ENCOUNTER — Telehealth: Payer: Commercial Managed Care - PPO | Admitting: Nurse Practitioner

## 2022-08-04 DIAGNOSIS — U071 COVID-19: Secondary | ICD-10-CM

## 2022-08-04 DIAGNOSIS — J4540 Moderate persistent asthma, uncomplicated: Secondary | ICD-10-CM

## 2022-08-04 MED ORDER — BENZONATATE 100 MG PO CAPS: 100.0000 mg | ORAL_CAPSULE | Freq: Three times a day (TID) | ORAL | 0 refills | Status: DC | PRN

## 2022-08-04 MED ORDER — NIRMATRELVIR/RITONAVIR (PAXLOVID)TABLET: 3.0000 | ORAL_TABLET | Freq: Two times a day (BID) | ORAL | 0 refills | Status: AC

## 2022-08-04 MED ORDER — ALBUTEROL SULFATE HFA 108 (90 BASE) MCG/ACT IN AERS
2.0000 | INHALATION_SPRAY | Freq: Four times a day (QID) | RESPIRATORY_TRACT | 2 refills | Status: DC | PRN
Start: 2022-08-04 — End: 2022-11-04
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
## 2022-08-04 NOTE — Patient Instructions (Signed)
Kayla Sexton, thank you for joining Bennie Pierini, FNP for today's virtual visit.  While this provider is not your primary care provider (PCP), if your PCP is located in our provider database this encounter information will be shared with them immediately following your visit.   A [facility] MyChart account gives you access to today's visit and all your visits, tests, and labs performed at [facility] " click here if you don't have a [facility] MyChart account or go to [email or web address]  Consent: (Patient) Kayla Sexton provided verbal consent for this virtual visit at the beginning of the encounter.  Current Medications:  Current Outpatient Medications:    benzonatate (TESSALON PERLES) 100 MG capsule, Take 1 capsule (100 mg total) by mouth 3 (three) times daily as needed for cough., Disp: 20 capsule, Rfl: 0   nirmatrelvir/ritonavir (PAXLOVID) 20 x 150 MG & 10 x 100MG  TABS, Take 3 tablets by mouth 2 (two) times daily for 5 days. (Take nirmatrelvir 150 mg two tablets twice daily for 5 days and ritonavir 100 mg one tablet twice daily for 5 days) Patient GFR is 106, Disp: 30 tablet, Rfl: 0   albuterol (VENTOLIN HFA) 108 (90 Base) MCG/ACT inhaler, Inhale 2 puffs into the lungs every 6 (six) hours as needed for wheezing or shortness of breath., Disp: 8 g, Rfl: 2   calcium carbonate (TUMS EX) 750 MG chewable tablet, Chew 2 tablets by mouth as needed for heartburn., Disp: , Rfl:    Cholecalciferol 1.25 MG (50000 UT) capsule, Take 1 capsule (50,000 Units total) by mouth daily., Disp: 12 capsule, Rfl: 4   ciprofloxacin (CIPRO) 500 MG tablet, Take 1 tablet (500 mg total) by mouth every 12 (twelve) hours., Disp: 10 tablet, Rfl: 0   cyclobenzaprine (FLEXERIL) 10 MG tablet, Take 10 mg by mouth at bedtime., Disp: , Rfl:    gabapentin (NEURONTIN) 600 MG tablet, Take 600 mg by mouth at bedtime., Disp: , Rfl:    Lactobacillus-Inulin (CULTURELLE DIGESTIVE DAILY PO), Take 1 tablet by  mouth daily., Disp: , Rfl:    Potassium Citrate 15 MEQ (1620 MG) TBCR, Take 2 tablets by mouth 2 (two) times daily., Disp: 120 tablet, Rfl: 3   Medications ordered in this encounter:  Meds ordered this encounter  Medications   nirmatrelvir/ritonavir (PAXLOVID) 20 x 150 MG & 10 x 100MG  TABS    Sig: Take 3 tablets by mouth 2 (two) times daily for 5 days. (Take nirmatrelvir 150 mg two tablets twice daily for 5 days and ritonavir 100 mg one tablet twice daily for 5 days) Patient GFR is 106    Dispense:  30 tablet    Refill:  0    Order Specific Question:   Supervising Provider    Answer:   Merrilee Jansky [[id number]]   benzonatate (TESSALON PERLES) 100 MG capsule    Sig: Take 1 capsule (100 mg total) by mouth 3 (three) times daily as needed for cough.    Dispense:  20 capsule    Refill:  0    Order Specific Question:   Supervising Provider    Answer:   Loreli Dollar   albuterol (VENTOLIN HFA) 108 (90 Base) MCG/ACT inhaler    Sig: Inhale 2 puffs into the lungs every 6 (six) hours as needed for wheezing or shortness of breath.    Dispense:  8 g    Refill:  2    Order Specific Question:   Supervising Provider    Answer:  Merrilee Jansky [[id number]]     *If you need refills on other medications prior to your next appointment, please contact your pharmacy*  Follow-Up: Call back or seek an in-person evaluation if the symptoms worsen or if the condition fails to improve as anticipated.  [facility] [phone]  Other Instructions 1. Take meds as prescribed 2. Use a cool mist humidifier especially during the winter months and when heat has been humid. 3. Use saline nose sprays frequently 4. Saline irrigations of the nose can be very helpful if done frequently.  * 4X daily for 1 week*  * Use of a nettie pot can be helpful with this. Follow directions with this* 5. Drink plenty of fluids 6. Keep thermostat turn down low 7.For any cough or congestion-  tessaolon perles 8. For fever or aces or pains- take tylenol or ibuprofen appropriate for age and weight.  * for fevers greater than 101 orally you may alternate ibuprofen and tylenol every  3 hours.      If you have been instructed to have an in-person evaluation today at a local Urgent Care facility, please use the link below. It will take you to a list of all of our available [facility], including address, phone number and hours of operation. Please do not delay care.  [facility]  If you or a family member do not have a primary care provider, use the link below to schedule a visit and establish care. When you choose a [facility] primary care physician or advanced practice provider, you gain a long-term partner in health. Find a Primary Care Provider  Learn more about [facility]'s in-office and virtual care options: [facility] - Get Care Now

## 2022-08-06 ENCOUNTER — Telehealth: Payer: Self-pay | Admitting: Family Medicine

## 2022-08-06 MED ORDER — HYDROCOD POLI-CHLORPHE POLI ER 10-8 MG/5ML PO SUER: 5.0000 mL | Freq: Two times a day (BID) | ORAL | 0 refills | Status: DC | PRN

## 2022-08-06 NOTE — Telephone Encounter (Signed)
Patient was diagnosed with covid and did a telehealth where she was prescribed benzonatate (TESSALON PERLES) 100 MG capsule  for her cough.She called in today asking could she have Tussinex called in by Dr Rubin Payor the tessalon perles isn't helping her cough at all and she's not able to sleep at night due to the coughing.    [facility] - [place], [place] - [address] Phone: [phone]  Fax: [phone]

## 2022-08-06 NOTE — Telephone Encounter (Signed)
Plz notify pt I sent in tussionex for her.

## 2022-08-06 NOTE — Addendum Note (Signed)
Addended by: Eustaquio Boyden on: 08/06/2022 04:41 PM   Modules accepted: Orders

## 2022-08-09 NOTE — Telephone Encounter (Signed)
Spoke with pt notifying her Tussionex rx was sent in. Pt expresses her thanks.

## 2022-08-11 IMAGING — DX DG CHEST 2V
2 series · 2 of 2 positions shown · non-contrast
Comparison: None.

CLINICAL DATA: Cough and wheezing for 4 days.  Low back pain.

EXAM:
CHEST - 2 VIEW

[chest pa]
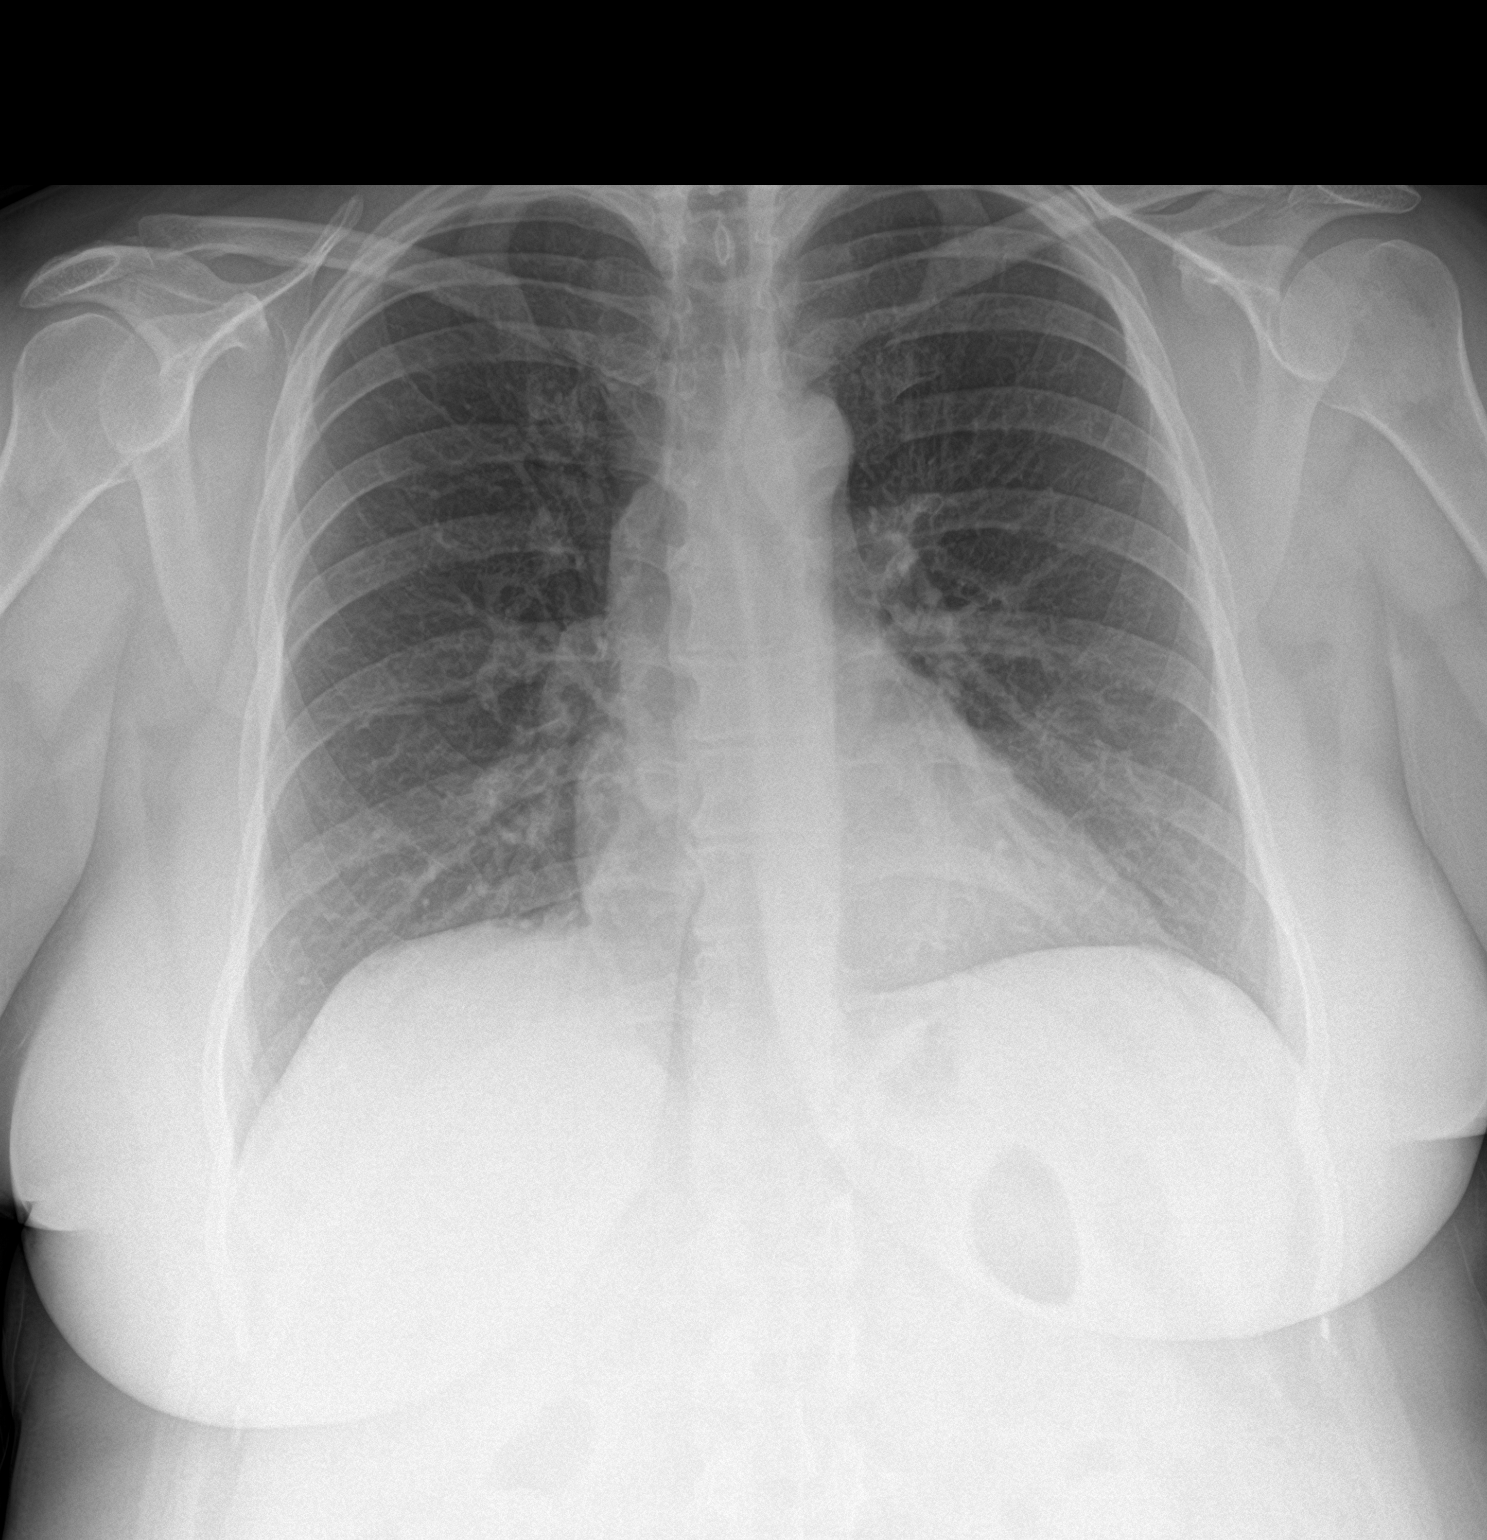

[chest lat]
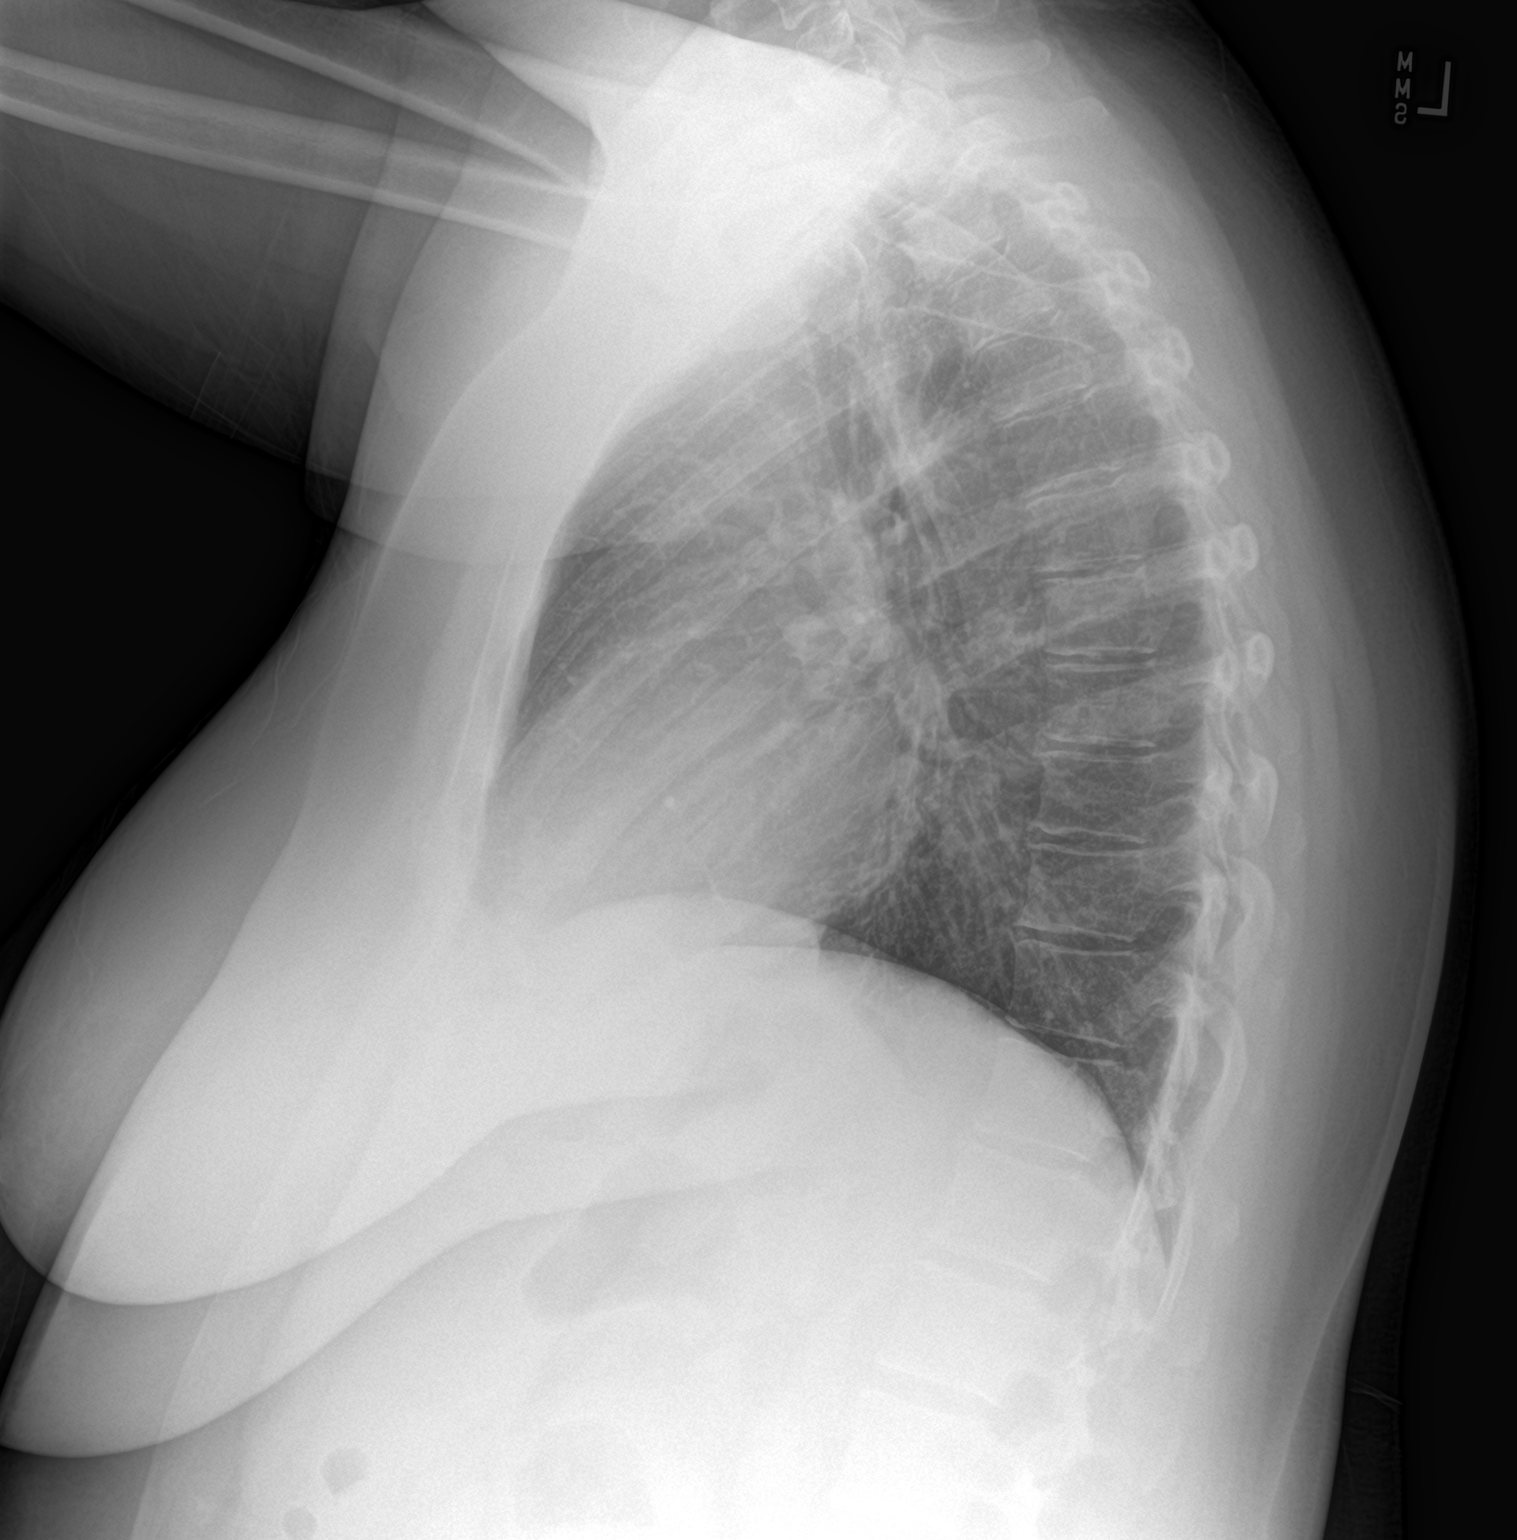

[2 of 2 positions shown; findings below may reference images not displayed]

FINDINGS: The heart size and mediastinal contours are normal. The lungs are
clear. There is no pleural effusion or pneumothorax. No acute
osseous findings are identified.
IMPRESSION: No active cardiopulmonary process.

## 2022-08-11 NOTE — Progress Notes (Deleted)
08/12/2022 11:52 AM   Kayla Sexton 1980/12/30 [id number]  Referring provider: Eustaquio Boyden, MD [address] [place],  [place] [address]  Urological history: 1. Nephrolithiasis -stone composition of 90% calcium oxalate and 10% uric acid  -24 hour metabolic work up noted extremely low urine volume, hypercalciuria, very low urine pH, moderate calcium oxalate stone risk, mild calcium phosphate stone risk and extremely uric acid super saturation -Right URS 06/2017, -Left URS 02/2018 -Right URS 03/2018 -Left URS 10/2020 -Left URS 06/2022  No chief complaint on file.   HPI: Kayla Sexton is a 42 y.o. female who presents today for recheck on UA.    Previous records reviewed.   UA ***  PMH: Past Medical History:  Diagnosis Date   Anemia    Asthma    Cancer (HCC) 2006   CERVICAL    CIN III (cervical intraepithelial neoplasia III)    Dysuria    Family history of adverse reaction to anesthesia    DAD AND BROTHER AND MOM-N/V   GERD (gastroesophageal reflux disease)    OCC   Hematuria    HH (hiatus hernia)    History of cervical dysplasia 2006   CIN1 and CIN2   History of kidney stones    Migraines    PCOS (polycystic ovarian syndrome)    Dr. Vincente Poli   PONV (postoperative nausea and vomiting)    HARD TO WAKE UP   Right ureteral calculus    Sleep apnea    DOES NOT USE CPAP    Surgical History: Past Surgical History:  Procedure Laterality Date   CERVICAL BIOPSY  W/ LOOP ELECTRODE EXCISION  12-04-2004   dr Vincente Poli  [facility]   COLONOSCOPY  03/2008   Dr. Bosie Clos   COMBINED HYSTEROSCOPY DIAGNOSTIC / D&C  02-28-2012   dr Vincente Poli   @ [facility]   CYSTOSCOPY W/ RETROGRADES Right 03/31/2018   Procedure: CYSTOSCOPY WITH RETROGRADE PYELOGRAM;  Surgeon: Sondra Come, MD;  Location: [facility];  Service: Urology;  Laterality: Right;   CYSTOSCOPY W/ RETROGRADES  11/11/2020   Procedure: CYSTOSCOPY WITH RETROGRADE PYELOGRAM;  Surgeon: Sondra Come, MD;  Location: [facility];   Service: Urology;;   CYSTOSCOPY WITH RETROGRADE PYELOGRAM, URETEROSCOPY AND STENT PLACEMENT Bilateral 07/21/2017   Procedure: CYSTOSCOPY WITH BILATERAL  RETROGRADE BILATERAL URETEROSCOPY AND STENTS  PLACEMENT;  Surgeon: Bjorn Pippin, MD;  Location: [facility];  Service: Urology;  Laterality: Bilateral;   CYSTOSCOPY WITH RETROGRADE URETHROGRAM Left 01/04/2020   Procedure: CYSTOSCOPY WITH RETROGRADE URETHROGRAM;  Surgeon: Sondra Come, MD;  Location: [facility];  Service: Urology;  Laterality: Left;   CYSTOSCOPY/URETEROSCOPY/HOLMIUM LASER/STENT PLACEMENT Left 03/17/2018   Procedure: CYSTOSCOPY/URETEROSCOPY/HOLMIUM LASER/STENT PLACEMENT;  Surgeon: Sondra Come, MD;  Location: [facility];  Service: Urology;  Laterality: Left;   CYSTOSCOPY/URETEROSCOPY/HOLMIUM LASER/STENT PLACEMENT Right 03/31/2018   Procedure: CYSTOSCOPY/URETEROSCOPY/HOLMIUM LASER/STENT PLACEMENT;  Surgeon: Sondra Come, MD;  Location: [facility];  Service: Urology;  Laterality: Right;   CYSTOSCOPY/URETEROSCOPY/HOLMIUM LASER/STENT PLACEMENT Left 01/04/2020   Procedure: CYSTOSCOPY/URETEROSCOPY/HOLMIUM LASER;  Surgeon: Sondra Come, MD;  Location: [facility];  Service: Urology;  Laterality: Left;   CYSTOSCOPY/URETEROSCOPY/HOLMIUM LASER/STENT PLACEMENT Left 11/11/2020   Procedure: CYSTOSCOPY/URETEROSCOPY/HOLMIUM LASER;  Surgeon: Sondra Come, MD;  Location: [facility];  Service: Urology;  Laterality: Left;   CYSTOSCOPY/URETEROSCOPY/HOLMIUM LASER/STENT PLACEMENT Left 07/02/2022   Procedure: CYSTOSCOPY/URETEROSCOPY/HOLMIUM LASER FOR STONE;  Surgeon: Sondra Come, MD;  Location: [facility];  Service: Urology;  Laterality: Left;   DILATION AND CURETTAGE OF  UTERUS     endoscopy  2010   Dr. Madilyn Fireman   EXTRACORPOREAL SHOCK WAVE LITHOTRIPSY  08/2021   FINGER SURGERY Right    MIDDLE FINGER   FOOT SURGERY Left 2015   5th bunionectomy w/ 2nd digit w/ fusion , retained hardware w/ 5th toe   FRACTURE SURGERY     left  shoulder   HOLMIUM LASER APPLICATION Bilateral 07/21/2017   Procedure: HOLMIUM LASER APPLICATION;  Surgeon: Bjorn Pippin, MD;  Location[facility];  Service: Urology;  Laterality: Bilateral;    Home Medications:  Allergies as of 08/12/2022       Reactions   Adhesive [tape] Swelling   Per pt only the 12 lead ekg pads cause "welps and abrasions"-ALSO SOME BANDAIDS CAUSE IRRITATION-OK TO USE PAPER TAPE   Metformin And Related Other (See Comments)   GI upset    Other    RED MEAT-ANAPHYLAXIS General Anesthesia - nausea and vomiting (scopolamine patch doesn't work)   Vancomycin Hives, Itching   Wound Dressing Adhesive    Bactrim [sulfamethoxazole-trimethoprim] Rash        Medication List        Accurate as of August 11, 2022 11:52 AM. If you have any questions, ask your nurse or doctor.          albuterol 108 (90 Base) MCG/ACT inhaler Commonly known as: VENTOLIN HFA Inhale 2 puffs into the lungs every 6 (six) hours as needed for wheezing or shortness of breath.   benzonatate 100 MG capsule Commonly known as: Tessalon Perles Take 1 capsule (100 mg total) by mouth 3 (three) times daily as needed for cough.   calcium carbonate 750 MG chewable tablet Commonly known as: TUMS EX Chew 2 tablets by mouth as needed for heartburn.   chlorpheniramine-HYDROcodone 10-8 MG/5ML Commonly known as: TUSSIONEX Take 5 mLs by mouth every 12 (twelve) hours as needed for cough (sedation precautions).   Cholecalciferol 1.25 MG (50000 UT) capsule Take 1 capsule (50,000 Units total) by mouth daily.   ciprofloxacin 500 MG tablet Commonly known as: CIPRO Take 1 tablet (500 mg total) by mouth every 12 (twelve) hours.   CULTURELLE DIGESTIVE DAILY PO Take 1 tablet by mouth daily.   cyclobenzaprine 10 MG tablet Commonly known as: FLEXERIL Take 10 mg by mouth at bedtime.   gabapentin 600 MG tablet Commonly known as: NEURONTIN Take 600 mg by mouth at bedtime.   Potassium  Citrate 15 MEQ (1620 MG) Tbcr Take 2 tablets by mouth 2 (two) times daily.        Allergies:  Allergies  Allergen Reactions   Adhesive [Tape] Swelling    Per pt only the 12 lead ekg pads cause "welps and abrasions"-ALSO SOME BANDAIDS CAUSE IRRITATION-OK TO USE PAPER TAPE   Metformin And Related Other (See Comments)    GI upset    Other     RED MEAT-ANAPHYLAXIS  General Anesthesia - nausea and vomiting (scopolamine patch doesn't work)   Vancomycin Hives and Itching   Wound Dressing Adhesive    Bactrim [Sulfamethoxazole-Trimethoprim] Rash    Family History: Family History  Problem Relation Age of Onset   Healthy Mother    Healthy Father    Breast cancer Paternal Grandmother    Breast cancer Other        Maternal Great Aunt    Social History:  reports that she has never smoked. She has never been exposed to tobacco smoke. She has never used smokeless tobacco. She reports that she does  not drink alcohol and does not use drugs.  ROS: Pertinent ROS in HPI  Physical Exam: LMP 09/29/2020 (Approximate) Comment: PCOS  Constitutional:  Well nourished. Alert and oriented, No acute distress. HEENT: [place] AT, moist mucus membranes.  Trachea midline, no masses. Cardiovascular: No clubbing, cyanosis, or edema. Respiratory: Normal respiratory effort, no increased work of breathing. GU: No CVA tenderness.  No bladder fullness or masses. Vulvovaginal atrophy w/ pallor, loss of rugae, introital retraction, excoriations.  Vulvar thinning, fusion of labia, clitoral hood retraction, prominent urethral meatus.   *** external genitalia, *** pubic hair distribution, no lesions.  Normal urethral meatus, no lesions, no prolapse, no discharge.   No urethral masses, tenderness and/or tenderness. No bladder fullness, tenderness or masses. *** vagina mucosa, *** estrogen effect, no discharge, no lesions, *** pelvic support, *** cystocele and *** rectocele noted.  No cervical motion tenderness.  Uterus is  freely mobile and non-fixed.  No adnexal/parametria masses or tenderness noted.  Anus and perineum are without rashes or lesions.   ***  Neurologic: Grossly intact, no focal deficits, moving all 4 extremities. Psychiatric: Normal mood and affect.    Laboratory Data: Lab Results  Component Value Date   WBC 8.8 05/24/2022   HGB 14.5 05/24/2022   HCT 43.1 05/24/2022   MCV 85.8 05/24/2022   PLT 337.0 05/24/2022    Lab Results  Component Value Date   CREATININE 0.70 05/24/2022    Lab Results  Component Value Date   HGBA1C 5.5 02/26/2022    Lab Results  Component Value Date   TSH 1.15 05/17/2022       Component Value Date/Time   CHOL 228 (H) 05/17/2022 0908   HDL 44.90 05/17/2022 0908   CHOLHDL 5 05/17/2022 0908   VLDL 32.4 05/17/2022 0908   LDLCALC 151 (H) 05/17/2022 0908    Lab Results  Component Value Date   AST 20 05/24/2022   Lab Results  Component Value Date   ALT 22 05/24/2022    Urinalysis    Component Value Date/Time   COLORURINE YELLOW 05/24/2022 1137   APPEARANCEUR Clear 06/24/2022 1300   LABSPEC 1.015 05/24/2022 1137   LABSPEC 1.025 03/08/2018 1636   PHURINE < OR = 5.0 05/24/2022 1137   GLUCOSEU Trace (A) 06/24/2022 1300   HGBUR 1+ (A) 05/24/2022 1137   BILIRUBINUR Negative 06/24/2022 1300   KETONESUR NEGATIVE 05/24/2022 1137   PROTEINUR 2+ (A) 06/24/2022 1300   PROTEINUR NEGATIVE 05/24/2022 1137   UROBILINOGEN 0.2 04/15/2020 0952   NITRITE Positive (A) 06/24/2022 1300   NITRITE NEGATIVE 11/04/2020 1258   LEUKOCYTESUR Trace (A) 06/24/2022 1300   LEUKOCYTESUR TRACE (A) 11/04/2020 1258  I have reviewed the labs.   Pertinent Imaging: N/A  Assessment & Plan:  ***    No follow-ups on file.  These notes generated with voice recognition software. I apologize for typographical errors.  Cloretta Ned  [facility] [address]  [address] [place], [place] [address] [phone]

## 2022-08-12 ENCOUNTER — Ambulatory Visit: Payer: Commercial Managed Care - PPO | Admitting: Urology

## 2022-08-17 ENCOUNTER — Encounter: Payer: Self-pay | Admitting: Urology

## 2022-08-19 ENCOUNTER — Ambulatory Visit: Payer: Commercial Managed Care - PPO | Admitting: Urology

## 2022-10-06 ENCOUNTER — Ambulatory Visit: Payer: Commercial Managed Care - PPO | Admitting: Internal Medicine

## 2022-10-14 IMAGING — US US EXTREM LOW VENOUS*R*
1 series · 13 of 24 positions shown · non-contrast
Comparison: None.

CLINICAL DATA: Calf region pain and edema

EXAM:
RIGHT LOWER EXTREMITY VENOUS DUPLEX ULTRASOUND
TECHNIQUE: Gray-scale sonography with graded compression, as well as color
Doppler and duplex ultrasound were performed to evaluate the right
lower extremity deep venous system from the level of the common
femoral vein and including the common femoral, femoral, profunda
femoral, popliteal and calf veins including the posterior tibial,
peroneal and gastrocnemius veins when visible. The superficial great
saphenous vein was also interrogated. Spectral Doppler was utilized
to evaluate flow at rest and with distal augmentation maneuvers in
the common femoral, femoral and popliteal veins.

[Series 1: us venous img lower uni right (dvt) · portal-venous · 13 of 33 slices shown]
[im 1/33]
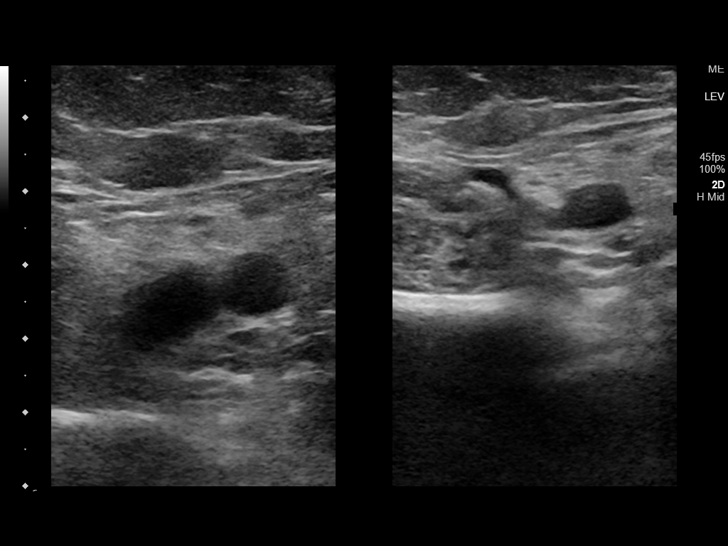
[im 3/33]
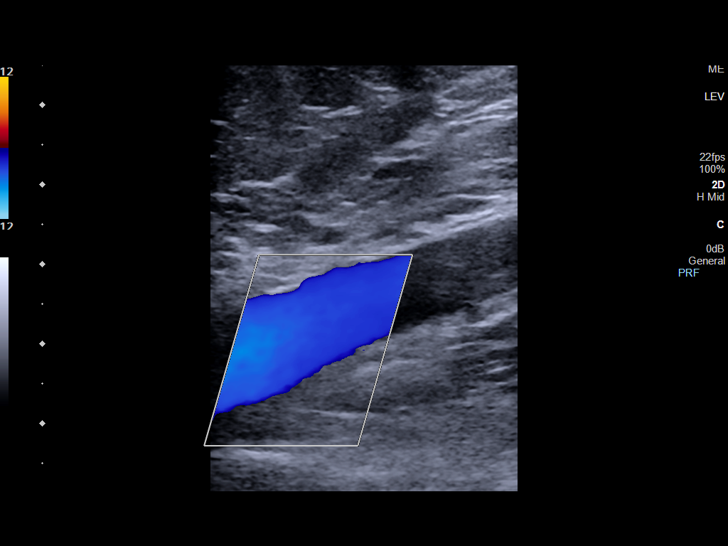
[im 6/33]
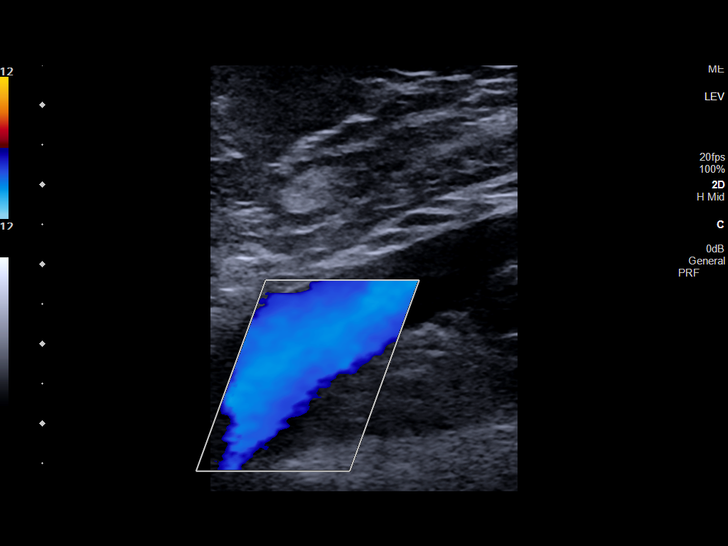
[im 9/33]
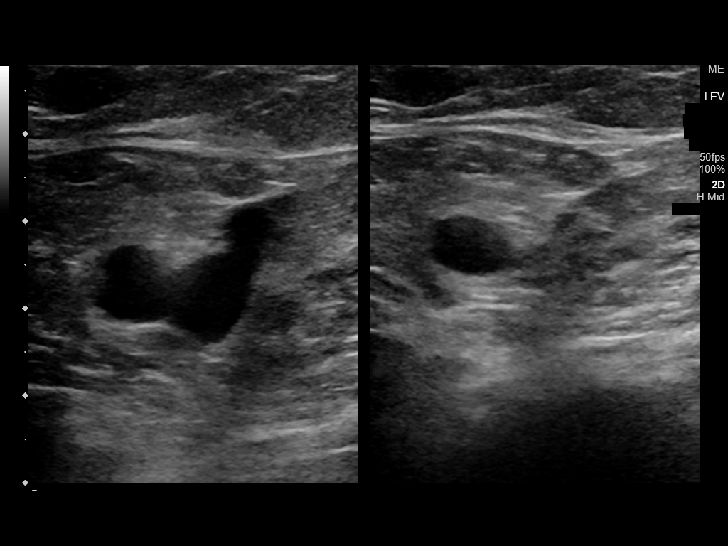
[im 12/33]
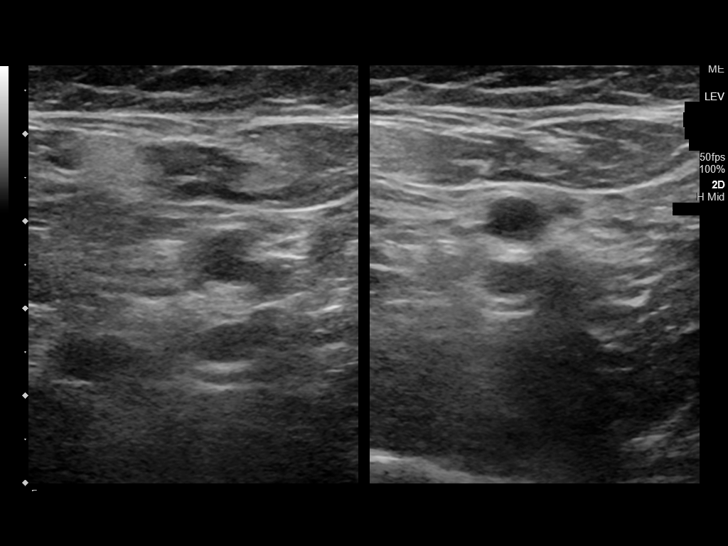
[im 14/33]
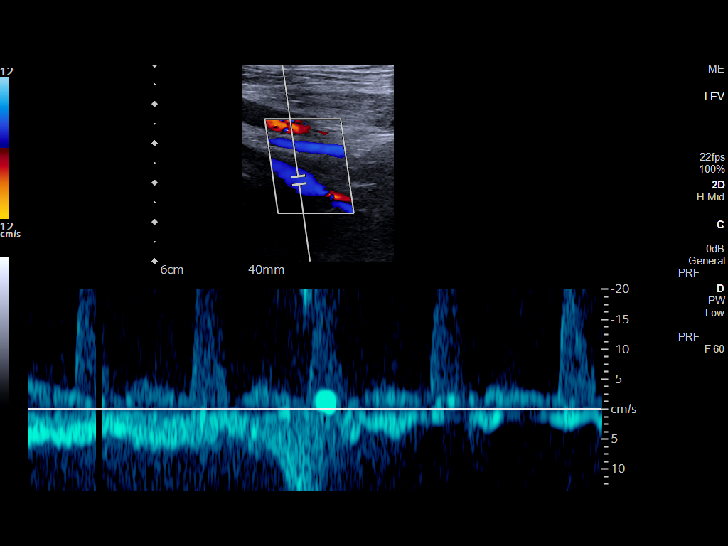
[im 17/33]
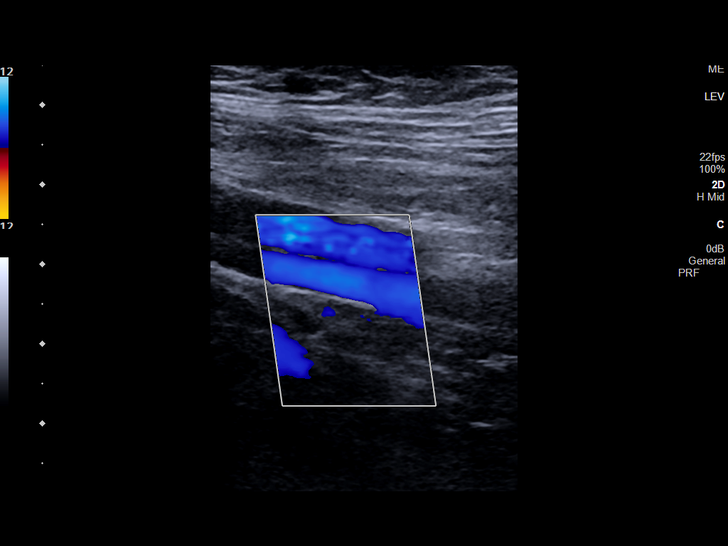
[im 19/33]
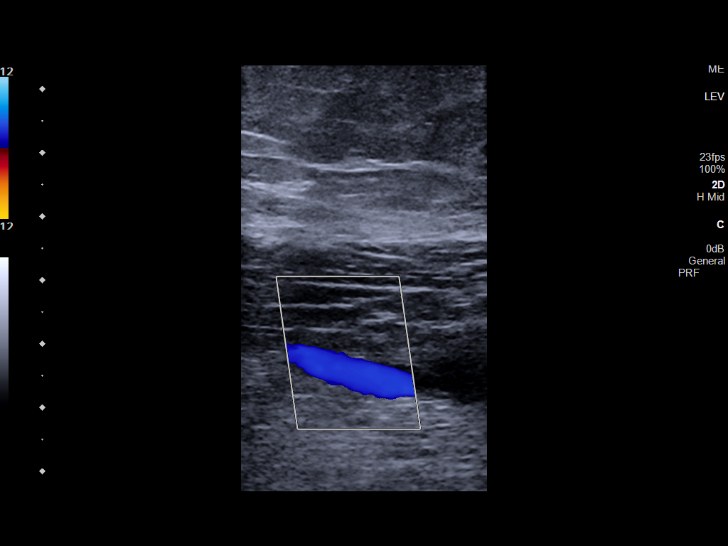
[im 21/33]
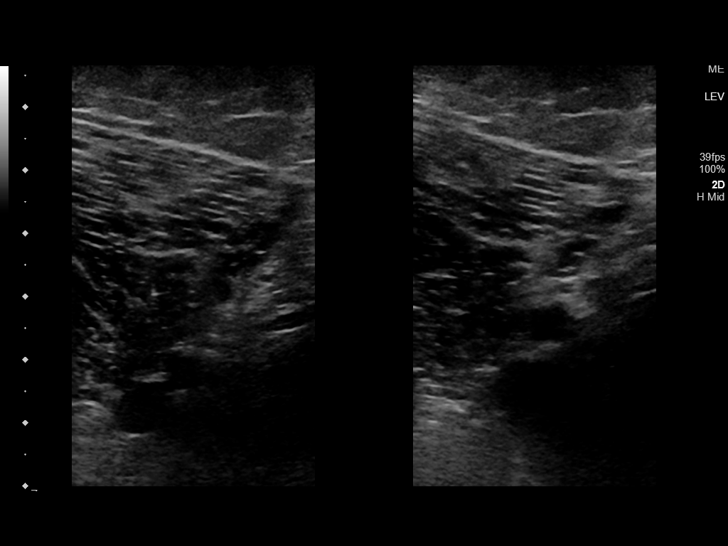
[im 24/33]
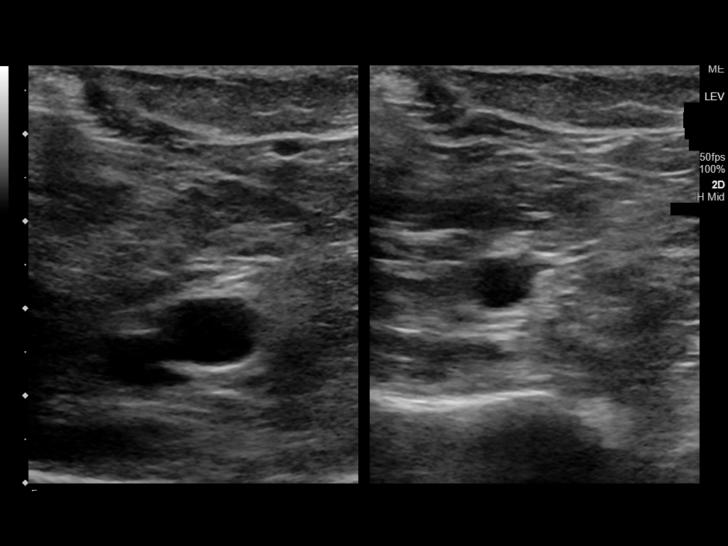
[im 27/33]
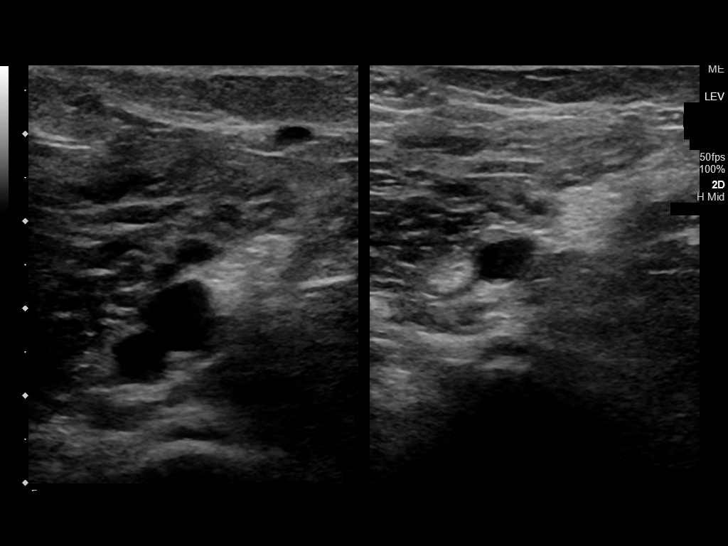
[im 30/33]
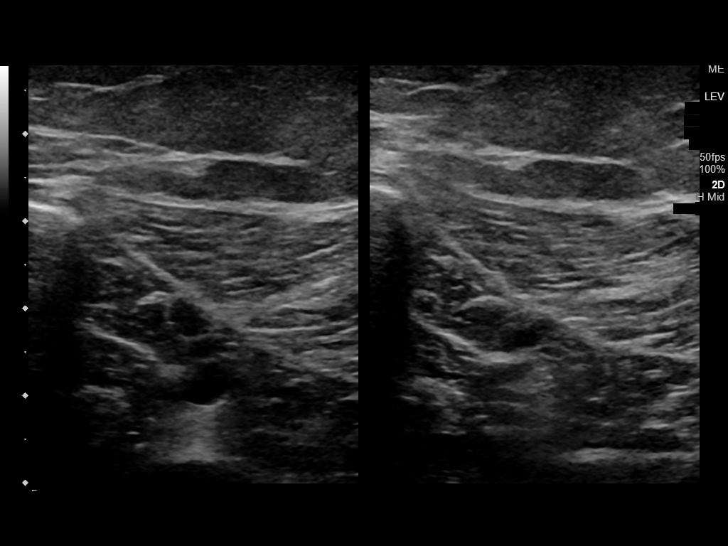
[im 33/33]
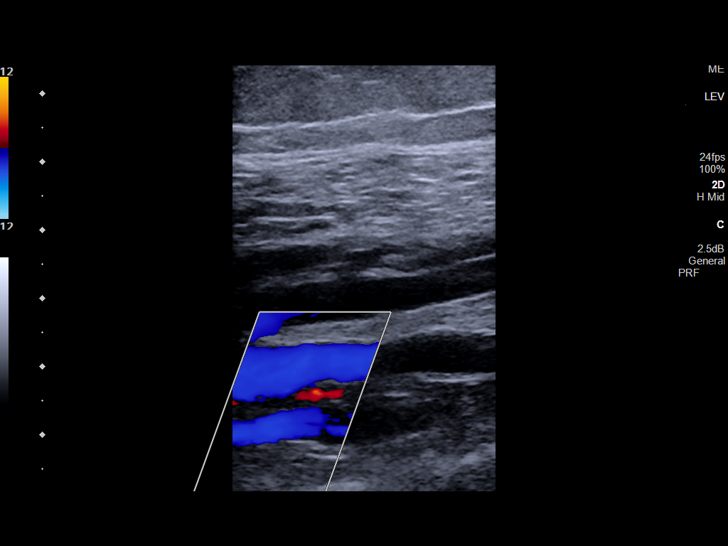

[13 of 24 positions shown; findings below may reference images not displayed]

FINDINGS: Contralateral Common Femoral Vein: Respiratory phasicity is normal
and symmetric with the symptomatic side. No evidence of thrombus.
Normal compressibility.

Common Femoral Vein: No evidence of thrombus. Normal
compressibility, respiratory phasicity and response to augmentation.

Saphenofemoral Junction: No evidence of thrombus. Normal
compressibility and flow on color Doppler imaging.

Profunda Femoral Vein: No evidence of thrombus. Normal
compressibility and flow on color Doppler imaging.

Femoral Vein: No evidence of thrombus. Normal compressibility,
respiratory phasicity and response to augmentation.

Popliteal Vein: No evidence of thrombus. Normal compressibility,
respiratory phasicity and response to augmentation.

Calf Veins: No evidence of thrombus. Normal compressibility and flow
on color Doppler imaging.

Superficial Great Saphenous Vein: No evidence of thrombus. Normal
compressibility.

Venous Reflux:  None.

Other Findings:  None.
IMPRESSION: No evidence of deep venous thrombosis in the right lower extremity.
Left common femoral vein also patent.

## 2022-11-03 ENCOUNTER — Telehealth: Payer: Commercial Managed Care - PPO | Admitting: Nurse Practitioner

## 2022-11-03 ENCOUNTER — Telehealth: Payer: Commercial Managed Care - PPO | Admitting: Physician Assistant

## 2022-11-03 DIAGNOSIS — J069 Acute upper respiratory infection, unspecified: Secondary | ICD-10-CM | POA: Diagnosis not present

## 2022-11-03 MED ORDER — BENZONATATE 100 MG PO CAPS
100.0000 mg | ORAL_CAPSULE | Freq: Three times a day (TID) | ORAL | 0 refills | Status: DC | PRN
Start: 2022-11-03 — End: 2022-12-29

## 2022-11-03 MED ORDER — FLUTICASONE PROPIONATE 50 MCG/ACT NA SUSP
2.0000 | Freq: Every day | NASAL | 0 refills | Status: DC
Start: 2022-11-03 — End: 2023-02-15

## 2022-11-03 NOTE — Progress Notes (Signed)
I have spent 5 minutes in review of e-visit questionnaire, review and updating patient chart, medical decision making and response to patient.   Mia Milan Cody Jacklynn Dehaas, PA-C    

## 2022-11-03 NOTE — Progress Notes (Signed)
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
## 2022-11-04 ENCOUNTER — Telehealth: Payer: Commercial Managed Care - PPO | Admitting: Physician Assistant

## 2022-11-04 DIAGNOSIS — U071 COVID-19: Secondary | ICD-10-CM

## 2022-11-04 DIAGNOSIS — J4540 Moderate persistent asthma, uncomplicated: Secondary | ICD-10-CM

## 2022-11-04 MED ORDER — ALBUTEROL SULFATE HFA 108 (90 BASE) MCG/ACT IN AERS
2.0000 | INHALATION_SPRAY | Freq: Four times a day (QID) | RESPIRATORY_TRACT | 2 refills | Status: DC | PRN
Start: 2022-11-04 — End: 2023-11-03

## 2022-11-04 MED ORDER — NIRMATRELVIR/RITONAVIR (PAXLOVID)TABLET
3.0000 | ORAL_TABLET | Freq: Two times a day (BID) | ORAL | 0 refills | Status: AC
Start: 2022-11-04 — End: 2022-11-09

## 2022-11-04 MED ORDER — PROMETHAZINE-DM 6.25-15 MG/5ML PO SYRP
5.0000 mL | ORAL_SOLUTION | Freq: Four times a day (QID) | ORAL | 0 refills | Status: DC | PRN
Start: 2022-11-04 — End: 2022-12-29
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
## 2022-11-04 NOTE — Patient Instructions (Signed)
[name], thank you for joining [name], PA-C for today's virtual visit.  While this provider is not your primary care provider (PCP), if your PCP is located in our provider database this encounter information will be shared with them immediately following your visit.   A [facility] MyChart account gives you access to today's visit and all your visits, tests, and labs performed at [facility] " click here if you don't have a [facility] MyChart account or go to [email or web address]  Consent: (Patient) Kayla Sexton provided verbal consent for this virtual visit at the beginning of the encounter.  Current Medications:  Current Outpatient Medications:    nirmatrelvir/ritonavir (PAXLOVID) 20 x 150 MG & 10 x 100MG  TABS, Take 3 tablets by mouth 2 (two) times daily for 5 days. (Take nirmatrelvir 150 mg two tablets twice daily for 5 days and ritonavir 100 mg one tablet twice daily for 5 days) Patient GFR is 108, Disp: 30 tablet, Rfl: 0   promethazine-dextromethorphan (PROMETHAZINE-DM) 6.25-15 MG/5ML syrup, Take 5 mLs by mouth 4 (four) times daily as needed for cough., Disp: 118 mL, Rfl: 0   albuterol (VENTOLIN HFA) 108 (90 Base) MCG/ACT inhaler, Inhale 2 puffs into the lungs every 6 (six) hours as needed for wheezing or shortness of breath., Disp: 8 g, Rfl: 2   benzonatate (TESSALON) 100 MG capsule, Take 1 capsule (100 mg total) by mouth 3 (three) times daily as needed for cough., Disp: 30 capsule, Rfl: 0   calcium carbonate (TUMS EX) 750 MG chewable tablet, Chew 2 tablets by mouth as needed for heartburn., Disp: , Rfl:    Cholecalciferol 1.25 MG (50000 UT) capsule, Take 1 capsule (50,000 Units total) by mouth daily., Disp: 12 capsule, Rfl: 4   cyclobenzaprine (FLEXERIL) 10 MG tablet, Take 10 mg by mouth at bedtime., Disp: , Rfl:    fluticasone (FLONASE) 50 MCG/ACT nasal spray, Place 2 sprays into both nostrils daily., Disp: 16 g, Rfl: 0   gabapentin (NEURONTIN) 600 MG  tablet, Take 600 mg by mouth at bedtime., Disp: , Rfl:    Lactobacillus-Inulin (CULTURELLE DIGESTIVE DAILY PO), Take 1 tablet by mouth daily., Disp: , Rfl:    Potassium Citrate 15 MEQ (1620 MG) TBCR, Take 2 tablets by mouth 2 (two) times daily., Disp: 120 tablet, Rfl: 3   Medications ordered in this encounter:  Meds ordered this encounter  Medications   nirmatrelvir/ritonavir (PAXLOVID) 20 x 150 MG & 10 x 100MG  TABS    Sig: Take 3 tablets by mouth 2 (two) times daily for 5 days. (Take nirmatrelvir 150 mg two tablets twice daily for 5 days and ritonavir 100 mg one tablet twice daily for 5 days) Patient GFR is 108    Dispense:  30 tablet    Refill:  0    Order Specific Question:   Supervising Provider    Answer:   Merrilee Jansky [[id number]]   promethazine-dextromethorphan (PROMETHAZINE-DM) 6.25-15 MG/5ML syrup    Sig: Take 5 mLs by mouth 4 (four) times daily as needed for cough.    Dispense:  118 mL    Refill:  0    Order Specific Question:   Supervising Provider    Answer:   Merrilee Jansky [[id number]]   albuterol (VENTOLIN HFA) 108 (90 Base) MCG/ACT inhaler    Sig: Inhale 2 puffs into the lungs every 6 (six) hours as needed for wheezing or shortness of breath.    Dispense:  8 g    Refill:  2    Order Specific Question:   Supervising Provider    Answer:   Merrilee Jansky [[id number]]     *If you need refills on other medications prior to your next appointment, please contact your pharmacy*  Follow-Up: Call back or seek an in-person evaluation if the symptoms worsen or if the condition fails to improve as anticipated.  [facility] [phone]  Care Instructions: Please keep well-hydrated and get plenty of rest. Start a saline nasal rinse to flush out your nasal passages. You can use plain Mucinex to help thin congestion. I have refilled your albuterol If you have a humidifier, running in the bedroom at night. I want you to start OTC vitamin D3 1000 units daily,  vitamin C 1000 mg daily, and a zinc supplement. Please take prescribed medications as directed.      Isolation Instructions: You are to isolate at home until you have been fever free for at least 24 hours without a fever-reducing medication, and symptoms have been steadily improving for 24 hours. At that time,  you can end isolation but need to mask for an additional 5 days.   If you must be around other household members who do not have symptoms, you need to make sure that both you and the family members are masking consistently with a high-quality mask.  If you note any worsening of symptoms despite treatment, please seek an in-person evaluation ASAP. If you note any significant shortness of breath or any chest pain, please seek ER evaluation. Please do not delay care!   COVID-19: What to Do if You Are Sick If you test positive and are an older adult or someone who is at high risk of getting very sick from COVID-19, treatment may be available. Contact a healthcare provider right away after a positive test to determine if you are eligible, even if your symptoms are mild right now. You can also visit a Test to Treat location and, if eligible, receive a prescription from a provider. Don't delay: Treatment must be started within the first few days to be effective. If you have a fever, cough, or other symptoms, you might have COVID-19. Most people have mild illness and are able to recover at home. If you are sick: Keep track of your symptoms. If you have an emergency warning sign (including trouble breathing), call 911. Steps to help prevent the spread of COVID-19 if you are sick If you are sick with COVID-19 or think you might have COVID-19, follow the steps below to care for yourself and to help protect other people in your home and community. Stay home except to get medical care Stay home. Most people with COVID-19 have mild illness and can recover at home without medical care. Do not leave  your home, except to get medical care. Do not visit public areas and do not go to places where you are unable to wear a mask. Take care of yourself. Get rest and stay hydrated. Take over-the-counter medicines, such as acetaminophen, to help you feel better. Stay in touch with your doctor. Call before you get medical care. Be sure to get care if you have trouble breathing, or have any other emergency warning signs, or if you think it is an emergency. Avoid public transportation, ride-sharing, or taxis if possible. Get tested If you have symptoms of COVID-19, get tested. While waiting for test results, stay away from others, including staying apart from those living in your household. Get tested as  soon as possible after your symptoms start. Treatments may be available for people with COVID-19 who are at risk for becoming very sick. Don't delay: Treatment must be started early to be effective--some treatments must begin within 5 days of your first symptoms. Contact your healthcare provider right away if your test result is positive to determine if you are eligible. Self-tests are one of several options for testing for the virus that causes COVID-19 and may be more convenient than laboratory-based tests and point-of-care tests. Ask your healthcare provider or your local health department if you need help interpreting your test results. You can visit your state, tribal, local, and territorial health department's website to look for the latest local information on testing sites. Separate yourself from other people As much as possible, stay in a specific room and away from other people and pets in your home. If possible, you should use a separate bathroom. If you need to be around other people or animals in or outside of the home, wear a well-fitting mask. Tell your close contacts that they may have been exposed to COVID-19. An infected person can spread COVID-19 starting 48 hours (or 2 days) before the person  has any symptoms or tests positive. By letting your close contacts know they may have been exposed to COVID-19, you are helping to protect everyone. See COVID-19 and Animals if you have questions about pets. If you are diagnosed with COVID-19, someone from the health department may call you. Answer the call to slow the spread. Monitor your symptoms Symptoms of COVID-19 include fever, cough, or other symptoms. Follow care instructions from your healthcare provider and local health department. Your local health authorities may give instructions on checking your symptoms and reporting information. When to seek emergency medical attention Look for emergency warning signs* for COVID-19. If someone is showing any of these signs, seek emergency medical care immediately: Trouble breathing Persistent pain or pressure in the chest New confusion Inability to wake or stay awake Pale, gray, or blue-colored skin, lips, or nail beds, depending on skin tone *This list is not all possible symptoms. Please call your medical provider for any other symptoms that are severe or concerning to you. Call 911 or call ahead to your local emergency facility: Notify the operator that you are seeking care for someone who has or may have COVID-19. Call ahead before visiting your doctor Call ahead. Many medical visits for routine care are being postponed or done by phone or telemedicine. If you have a medical appointment that cannot be postponed, call your doctor's office, and tell them you have or may have COVID-19. This will help the office protect themselves and other patients. If you are sick, wear a well-fitting mask You should wear a mask if you must be around other people or animals, including pets (even at home). Wear a mask with the best fit, protection, and comfort for you. You don't need to wear the mask if you are alone. If you can't put on a mask (because of trouble breathing, for example), cover your coughs and  sneezes in some other way. Try to stay at least 6 feet away from other people. This will help protect the people around you. Masks should not be placed on young children under age 80 years, anyone who has trouble breathing, or anyone who is not able to remove the mask without help. Cover your coughs and sneezes Cover your mouth and nose with a tissue when you cough or sneeze. Throw away  used tissues in a lined trash can. Immediately wash your hands with soap and water for at least 20 seconds. If soap and water are not available, clean your hands with an alcohol-based hand sanitizer that contains at least 60% alcohol. Clean your hands often Wash your hands often with soap and water for at least 20 seconds. This is especially important after blowing your nose, coughing, or sneezing; going to the bathroom; and before eating or preparing food. Use hand sanitizer if soap and water are not available. Use an alcohol-based hand sanitizer with at least 60% alcohol, covering all surfaces of your hands and rubbing them together until they feel dry. Soap and water are the best option, especially if hands are visibly dirty. Avoid touching your eyes, nose, and mouth with unwashed hands. Handwashing Tips Avoid sharing personal household items Do not share dishes, drinking glasses, cups, eating utensils, towels, or bedding with other people in your home. Wash these items thoroughly after using them with soap and water or put in the dishwasher. Clean surfaces in your home regularly Clean and disinfect high-touch surfaces (for example, doorknobs, tables, handles, light switches, and countertops) in your "sick room" and bathroom. In shared spaces, you should clean and disinfect surfaces and items after each use by the person who is ill. If you are sick and cannot clean, a caregiver or other person should only clean and disinfect the area around you (such as your bedroom and bathroom) on an as needed basis. Your  caregiver/other person should wait as long as possible (at least several hours) and wear a mask before entering, cleaning, and disinfecting shared spaces that you use. Clean and disinfect areas that may have blood, stool, or body fluids on them. Use household cleaners and disinfectants. Clean visible dirty surfaces with household cleaners containing soap or detergent. Then, use a household disinfectant. Use a product from [employer] List N: Disinfectants for Coronavirus (COVID-19). Be sure to follow the instructions on the label to ensure safe and effective use of the product. Many products recommend keeping the surface wet with a disinfectant for a certain period of time (look at "contact time" on the product label). You may also need to wear personal protective equipment, such as gloves, depending on the directions on the product label. Immediately after disinfecting, wash your hands with soap and water for 20 seconds. For completed guidance on cleaning and disinfecting your home, visit Complete Disinfection Guidance. Take steps to improve ventilation at home Improve ventilation (air flow) at home to help prevent from spreading COVID-19 to other people in your household. Clear out COVID-19 virus particles in the air by opening windows, using air filters, and turning on fans in your home. Use this interactive tool to learn how to improve air flow in your home. When you can be around others after being sick with COVID-19 Deciding when you can be around others is different for different situations. Find out when you can safely end home isolation. For any additional questions about your care, contact your healthcare provider or state or local health department. 04/15/2020 Content source: [facility] ([facility]), Division of Viral Diseases This information is not intended to replace advice given to you by your health care provider. Make sure you discuss any  questions you have with your health care provider. Document Revised: 05/29/2020 Document Reviewed: 05/29/2020 Elsevier Patient Education  2022 [employer].   If you have been instructed to have an in-person evaluation today at a local  Urgent Care facility, please use the link below. It will take you to a list of all of our available [facility], including address, phone number and hours of operation. Please do not delay care.  [facility]  If you or a family member do not have a primary care provider, use the link below to schedule a visit and establish care. When you choose a [facility] primary care physician or advanced practice provider, you gain a long-term partner in health. Find a Primary Care Provider  Learn more about [facility]'s in-office and virtual care options: [facility] - Get Care Now

## 2022-11-13 ENCOUNTER — Telehealth: Payer: Commercial Managed Care - PPO | Admitting: Physician Assistant

## 2022-11-13 DIAGNOSIS — B9689 Other specified bacterial agents as the cause of diseases classified elsewhere: Secondary | ICD-10-CM

## 2022-11-13 DIAGNOSIS — J019 Acute sinusitis, unspecified: Secondary | ICD-10-CM

## 2022-11-13 MED ORDER — DOXYCYCLINE HYCLATE 100 MG PO TABS
100.0000 mg | ORAL_TABLET | Freq: Two times a day (BID) | ORAL | 0 refills | Status: DC
Start: 2022-11-13 — End: 2022-12-29

## 2022-11-13 NOTE — Patient Instructions (Signed)
Kayla Sexton, thank you for joining [name], PA-C for today's virtual visit.  While this provider is not your primary care provider (PCP), if your PCP is located in our provider database this encounter information will be shared with them immediately following your visit.   A [facility] MyChart account gives you access to today's visit and all your visits, tests, and labs performed at [facility] " click here if you don't have a [facility] MyChart account or go to [email or web address]  Consent: (Patient) Kayla Sexton provided verbal consent for this virtual visit at the beginning of the encounter.  Current Medications:  Current Outpatient Medications:    albuterol (VENTOLIN HFA) 108 (90 Base) MCG/ACT inhaler, Inhale 2 puffs into the lungs every 6 (six) hours as needed for wheezing or shortness of breath., Disp: 8 g, Rfl: 2   benzonatate (TESSALON) 100 MG capsule, Take 1 capsule (100 mg total) by mouth 3 (three) times daily as needed for cough., Disp: 30 capsule, Rfl: 0   calcium carbonate (TUMS EX) 750 MG chewable tablet, Chew 2 tablets by mouth as needed for heartburn., Disp: , Rfl:    Cholecalciferol 1.25 MG (50000 UT) capsule, Take 1 capsule (50,000 Units total) by mouth daily., Disp: 12 capsule, Rfl: 4   cyclobenzaprine (FLEXERIL) 10 MG tablet, Take 10 mg by mouth at bedtime., Disp: , Rfl:    fluticasone (FLONASE) 50 MCG/ACT nasal spray, Place 2 sprays into both nostrils daily., Disp: 16 g, Rfl: 0   gabapentin (NEURONTIN) 600 MG tablet, Take 600 mg by mouth at bedtime., Disp: , Rfl:    Lactobacillus-Inulin (CULTURELLE DIGESTIVE DAILY PO), Take 1 tablet by mouth daily., Disp: , Rfl:    Potassium Citrate 15 MEQ (1620 MG) TBCR, Take 2 tablets by mouth 2 (two) times daily., Disp: 120 tablet, Rfl: 3   promethazine-dextromethorphan (PROMETHAZINE-DM) 6.25-15 MG/5ML syrup, Take 5 mLs by mouth 4 (four) times daily as needed for cough., Disp: 118 mL, Rfl: 0    Medications ordered in this encounter:  No orders of the defined types were placed in this encounter.    *If you need refills on other medications prior to your next appointment, please contact your pharmacy*  Follow-Up: Call back or seek an in-person evaluation if the symptoms worsen or if the condition fails to improve as anticipated.  [facility] [phone]  Other Instructions Please take antibiotic as directed.  Increase fluid intake.  Use Saline nasal spray.  Take a daily multivitamin. Continue the Flonase and Nyquil.  Place a humidifier in the bedroom.  Please call or return clinic if symptoms are not improving.  Sinusitis Sinusitis is redness, soreness, and swelling (inflammation) of the paranasal sinuses. Paranasal sinuses are air pockets within the bones of your face (beneath the eyes, the middle of the forehead, or above the eyes). In healthy paranasal sinuses, mucus is able to drain out, and air is able to circulate through them by way of your nose. However, when your paranasal sinuses are inflamed, mucus and air can become trapped. This can allow bacteria and other germs to grow and cause infection. Sinusitis can develop quickly and last only a short time (acute) or continue over a long period (chronic). Sinusitis that lasts for more than 12 weeks is considered chronic.  CAUSES  Causes of sinusitis include: Allergies. Structural abnormalities, such as displacement of the cartilage that separates your nostrils (deviated septum), which can decrease the air flow through your nose and sinuses  and affect sinus drainage. Functional abnormalities, such as when the small hairs (cilia) that line your sinuses and help remove mucus do not work properly or are not present. SYMPTOMS  Symptoms of acute and chronic sinusitis are the same. The primary symptoms are pain and pressure around the affected sinuses. Other symptoms include: Upper toothache. Earache. Headache. Bad  breath. Decreased sense of smell and taste. A cough, which worsens when you are lying flat. Fatigue. Fever. Thick drainage from your nose, which often is green and may contain pus (purulent). Swelling and warmth over the affected sinuses. DIAGNOSIS  Your caregiver will perform a physical exam. During the exam, your caregiver may: Look in your nose for signs of abnormal growths in your nostrils (nasal polyps). Tap over the affected sinus to check for signs of infection. View the inside of your sinuses (endoscopy) with a special imaging device with a light attached (endoscope), which is inserted into your sinuses. If your caregiver suspects that you have chronic sinusitis, one or more of the following tests may be recommended: Allergy tests. Nasal culture A sample of mucus is taken from your nose and sent to a lab and screened for bacteria. Nasal cytology A sample of mucus is taken from your nose and examined by your caregiver to determine if your sinusitis is related to an allergy. TREATMENT  Most cases of acute sinusitis are related to a viral infection and will resolve on their own within 10 days. Sometimes medicines are prescribed to help relieve symptoms (pain medicine, decongestants, nasal steroid sprays, or saline sprays).  However, for sinusitis related to a bacterial infection, your caregiver will prescribe antibiotic medicines. These are medicines that will help kill the bacteria causing the infection.  Rarely, sinusitis is caused by a fungal infection. In theses cases, your caregiver will prescribe antifungal medicine. For some cases of chronic sinusitis, surgery is needed. Generally, these are cases in which sinusitis recurs more than 3 times per year, despite other treatments. HOME CARE INSTRUCTIONS  Drink plenty of water. Water helps thin the mucus so your sinuses can drain more easily. Use a humidifier. Inhale steam 3 to 4 times a day (for example, sit in the bathroom with the  shower running). Apply a warm, moist washcloth to your face 3 to 4 times a day, or as directed by your caregiver. Use saline nasal sprays to help moisten and clean your sinuses. Take over-the-counter or prescription medicines for pain, discomfort, or fever only as directed by your caregiver. SEEK IMMEDIATE MEDICAL CARE IF: You have increasing pain or severe headaches. You have nausea, vomiting, or drowsiness. You have swelling around your face. You have vision problems. You have a stiff neck. You have difficulty breathing. MAKE SURE YOU:  Understand these instructions. Will watch your condition. Will get help right away if you are not doing well or get worse. Document Released: 01/11/2005 Document Revised: 04/05/2011 Document Reviewed: 01/26/2011 [facility] Patient Information 2014 [place], [place].    If you have been instructed to have an in-person evaluation today at a local Urgent Care facility, please use the link below. It will take you to a list of all of our available [facility], including address, phone number and hours of operation. Please do not delay care.  [facility]  If you or a family member do not have a primary care provider, use the link below to schedule a visit and establish care. When you choose a  primary care physician or advanced  practice provider, you gain a long-term partner in health. Find a Primary Care Provider  Learn more about [facility]'s in-office and virtual care options: [facility] - Get Care Now
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
## 2022-12-29 ENCOUNTER — Encounter: Payer: Self-pay | Admitting: Family Medicine

## 2022-12-29 ENCOUNTER — Ambulatory Visit (INDEPENDENT_AMBULATORY_CARE_PROVIDER_SITE_OTHER): Payer: Commercial Managed Care - PPO | Admitting: Family Medicine

## 2022-12-29 VITALS — BP 116/78 | HR 87 | Temp 97.9°F | Ht 61.0 in | Wt 205.4 lb

## 2022-12-29 DIAGNOSIS — R1310 Dysphagia, unspecified: Secondary | ICD-10-CM | POA: Insufficient documentation

## 2022-12-29 DIAGNOSIS — R1314 Dysphagia, pharyngoesophageal phase: Secondary | ICD-10-CM

## 2022-12-29 DIAGNOSIS — K219 Gastro-esophageal reflux disease without esophagitis: Secondary | ICD-10-CM | POA: Insufficient documentation

## 2022-12-29 MED ORDER — FAMOTIDINE 20 MG PO TABS
20.0000 mg | ORAL_TABLET | Freq: Two times a day (BID) | ORAL | 1 refills | Status: DC
Start: 2022-12-29 — End: 2023-01-23

## 2022-12-29 NOTE — Patient Instructions (Signed)
Avoid your acid reflux triggers   Take pepcid 20 mg twice daily  Follow up with pcp in 2-3 weeks   If symptoms worsen= call and let us know    Try to stay hydrated  Eat slowly  Take small bites    Take care of yourself
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
## 2022-12-29 NOTE — Assessment & Plan Note (Signed)
Pharyngeal esophageal most likely / food and fluids Reassuring exam No signs and symptoms of cva  Has GERD with frequent heartburn and throat clearing- this may be adding to dysphagia   Will try pepcid 20 mg bid (has tried this in past daily and also nexium) Encouraged to watch diet for triggers  Handouts given  Reviewed notes from past regarding GERD from pcp   Plan follow up with pcp 2-3 weeks or earlier if worse Call back and Er precautions noted in detail today

## 2022-12-29 NOTE — Assessment & Plan Note (Signed)
May be adding to /causing swallowing issues and throat clearing   Will try pepcid 20 mg bid  Reviewed old records Old EGD not in chart but per pt had evidence of GERD   Follow up pcp 2-3 wk Watch diet  Is also working on weight loss

## 2023-01-14 ENCOUNTER — Ambulatory Visit: Payer: Commercial Managed Care - PPO | Admitting: Family Medicine

## 2023-01-20 ENCOUNTER — Other Ambulatory Visit: Payer: Self-pay | Admitting: Family Medicine

## 2023-01-20 NOTE — Telephone Encounter (Signed)
Message from pharmacy:  REQUEST FOR 90 DAYS PRESCRIPTION.   Famotidine Last filled:  12/29/22, #60 Last OV:  05/28/22, CPE; seen by Dr Milinda Antis on 12/29/22 for GERD Next OV:  none

## 2023-02-01 ENCOUNTER — Ambulatory Visit: Payer: Self-pay | Admitting: Family Medicine

## 2023-02-01 NOTE — Telephone Encounter (Signed)
 Copied from CRM [id number]. Topic: Clinical - Red Word Triage >> Feb 01, 2023  2:00 PM Brittany M wrote: Reason for CRM: Patient got a flu and covid shot on 1/5 and its very red and hot and when you touch it, you can feel where the shot was inserted. She is in extreme pain. She thinks she might have an allergic reaction to it. Her face is breaking out too.  Chief Complaint: left arm swollen Symptoms: arm swelling post receiving  vaccines  Frequency: constant Pertinent Negatives: Patient denies fever, sob, Disposition: [] ED /[x] Urgent Care (no appt availability in office) / [] Appointment(In office/virtual)/ []  [facility]/ [] Home Care/ [] Refused Recommended Disposition /[] [facility]/ []  Follow-up with PCP Additional Notes: Patient called with c/o left arm swelling after receiving two vaccines, covid 19 and flu.  Does not know what vaccine was given in what arm.  States noticed lump yesterday and noticed that it is getting bigger.  Instructed to make apt. For today, declines d/t not able to make it.  Instructed to go to UC and to er if becomes worse.  Care advice given and patient denies questions.   Reason for Disposition  [1] Redness or red streak around the injection site AND [2] begins > 48 hours after shot AND [3] fever  Answer Assessment - Initial Assessment Questions 1. SYMPTOMS: What is the main symptom? (e.g., redness, swelling, pain)         Had flu and covid 19 vaccine on Sunday at [facility] Yesterday had noticed that her left arm a bump  states it is growing.  States doesn't remember what arm received what shot.    2. ONSET: When was the vaccine (shot) given? How much later did the  begin? (e.g., hours, days ago)      Sunday and noticed a lump on left arm yesterday 3. SEVERITY: How bad is it?      4/10 4. FEVER: Is there a fever? If Yes, ask: What is it, how was it measured, and when did it start?      No, but arm is warm 5. IMMUNIZATIONS  GIVEN: What shots have you recently received?     Flu and covid 19  7. OTHER SYMPTOMS: Do you have any other symptoms?     Face is also breaking out  Protocols used: Immunization Reactions-A-AH

## 2023-02-01 NOTE — Telephone Encounter (Signed)
 Agree with eval - plz call tomorrow for update on symptoms.

## 2023-02-02 NOTE — Telephone Encounter (Signed)
Lvm asking pt to call back.  Need update on pt's sxs.

## 2023-02-03 NOTE — Telephone Encounter (Signed)
Lvm asking pt to call back.  Need update on pt's sxs.

## 2023-02-07 NOTE — Telephone Encounter (Signed)
Lvm asking pt to call back.  Need update on pt's sxs.

## 2023-02-15 ENCOUNTER — Telehealth (INDEPENDENT_AMBULATORY_CARE_PROVIDER_SITE_OTHER): Payer: Commercial Managed Care - PPO | Admitting: Family Medicine

## 2023-02-15 ENCOUNTER — Ambulatory Visit: Payer: Self-pay | Admitting: Family Medicine

## 2023-02-15 VITALS — BP 123/82 | Ht 61.0 in | Wt 200.0 lb

## 2023-02-15 DIAGNOSIS — J4540 Moderate persistent asthma, uncomplicated: Secondary | ICD-10-CM

## 2023-02-15 DIAGNOSIS — R21 Rash and other nonspecific skin eruption: Secondary | ICD-10-CM | POA: Insufficient documentation

## 2023-02-15 DIAGNOSIS — H9201 Otalgia, right ear: Secondary | ICD-10-CM | POA: Insufficient documentation

## 2023-02-15 DIAGNOSIS — R051 Acute cough: Secondary | ICD-10-CM | POA: Insufficient documentation

## 2023-02-15 DIAGNOSIS — R053 Chronic cough: Secondary | ICD-10-CM | POA: Insufficient documentation

## 2023-02-15 MED ORDER — FLUCONAZOLE 150 MG PO TABS: ORAL_TABLET | ORAL | 0 refills | Status: DC

## 2023-02-15 MED ORDER — AMOXICILLIN 500 MG PO CAPS
1000.0000 mg | ORAL_CAPSULE | Freq: Two times a day (BID) | ORAL | 0 refills | Status: DC
Start: 2023-02-15 — End: 2023-03-21

## 2023-02-15 NOTE — Telephone Encounter (Signed)
Appreciate Dr Diona Browner seeing patient.

## 2023-02-15 NOTE — Assessment & Plan Note (Signed)
Acute Symptoms most consistent with viral upper respiratory tract infection but unable to examine right ear and patient with fever. Initial COVID test negative but may be early on in illness, false negative potential. Will cover her for bacterial sinus infection, ear infection or strep throat with amoxicillin 500 mg 2 tablets twice daily x 10 days. If symptoms not improving she will come in for in person exam.

## 2023-02-15 NOTE — Telephone Encounter (Signed)
Copied from CRM [id number]. Topic: Clinical - Pink Word Triage >> Feb 15, 2023  9:13 AM Isabell A wrote: Reason for Triage: Patient hasn't been feeling well for the past two days - experiencing stuffiness in her head, sore throat & a low grade fever  (she doesn't have a thermometer to check).   Chief Complaint: dry cough Symptoms: sore throat, sinus pain Frequency: ongoing 2 days Pertinent Negatives: Patient denies body aches Disposition: [] ED /[] Urgent Care (no appt availability in office) / [x] Appointment(In office/virtual)/ []  [facility]/ [] Home Care/ [] Refused Recommended Disposition /[] [facility]/ []  Follow-up with PCP Additional Notes: The patient reported a dry cough, sinus pain, and a low grade fever for the past two days.  Her throat became sore last night and her left ear aches with swallowing.  She requested a virtual visit as she did not want to go out in the cold as she is not feeling well.  She performed an at home Covid test yesterday and it was negative.  She was scheduled for a same day virtual visit.   Reason for Disposition  Earache is present  Answer Assessment - Initial Assessment Questions 1. ONSET: "When did the cough begin?"      2 days  2. SEVERITY: "How bad is the cough today?"      On and off throughout the day; worse with talking  3. SPUTUM: "Describe the color of your sputum" (none, dry cough; clear, white, yellow, green)     none 4. HEMOPTYSIS: "Are you coughing up any blood?" If so ask: "How much?" (flecks, streaks, tablespoons, etc.)     None  5. DIFFICULTY BREATHING: "Are you having difficulty breathing?" If Yes, ask: "How bad is it?" (e.g., mild, moderate, severe)    - MILD: No SOB at rest, mild SOB with walking, speaks normally in sentences, can lie down, no retractions, pulse < 100.    - MODERATE: SOB at rest, SOB with minimal exertion and prefers to sit, cannot lie down flat, speaks in phrases, mild retractions, audible wheezing,  pulse 100-120.    - SEVERE: Very SOB at rest, speaks in single words, struggling to breathe, sitting hunched forward, retractions, pulse > 120      None  6. FEVER: "Do you have a fever?" If Yes, ask: "What is your temperature, how was it measured, and when did it start?"     Low grade fever; face is flushed and face is hot to touch this morning  7. CARDIAC HISTORY: "Do you have any history of heart disease?" (e.g., heart attack, congestive heart failure)      None  8. LUNG HISTORY: "Do you have any history of lung disease?"  (e.g., pulmonary embolus, asthma, emphysema)     Asthma, sleep apnea  10. OTHER SYMPTOMS: "Do you have any other symptoms?" (e.g., runny nose, wheezing, chest pain)       Sneezing, sore throat, mild headache sinus soreness 11. PREGNANCY: "Is there any chance you are pregnant?" "When was your last menstrual period?"       No  Protocols used: Cough - Acute Non-Productive-A-AH

## 2023-02-15 NOTE — Assessment & Plan Note (Signed)
Acute, symptoms described most consistent with possible staph infection.  Will treat with amoxicillin as stated above.  If redness spreading or persistent fevers she will make in person exam. Description not consistent with viral rash.  Return and ER precautions provided

## 2023-02-15 NOTE — Progress Notes (Signed)
Patient ID: Kayla Sexton, female    DOB: 05/25/1980, 43 y.o.   MRN: [id number]  This visit was conducted in person.  BP 123/82   Ht 5\' 1"  (1.549 m)   Wt 200 lb (90.7 kg)   LMP 09/29/2020 (Approximate) Comment: PCOS  BMI 37.79 kg/m    CC:  Chief Complaint  Patient presents with   Cough    Dry X 2 days ago   Sore Throat   Otalgia   Fever    Low Grade   Chills    Subjective:   HPI: Kayla Sexton is a 43 y.o. female patient of Dr. Sharen Hones with history of moderate persistent asthma presenting on 02/15/2023 for Cough (Dry X 2 days ago), Sore Throat, Otalgia, Fever (Low Grade), and Chills  Date of onset:  2 week Initial symptoms included  constant sneezing  Now in last 2-3 days Symptoms progressed to feeling fatigue, coughing fits with talking. Dry, no production.  Nasal congestion, sinus headache  This AM ST, right ear sore  Subjective fever  No SOB, no wheeze.   Has broken out with  acne bumps on left cheek and under mouth.. not itchy, mildly sore  Applying Blistex  No new exposures to allergens.   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       Relevant past medical, surgical, family and social history reviewed and updated as indicated. Interim medical history since our last visit reviewed. Allergies and medications reviewed and updated. Outpatient Medications Prior to Visit  Medication Sig Dispense Refill   albuterol (VENTOLIN HFA) 108 (90 Base) MCG/ACT inhaler Inhale 2 puffs into the lungs every 6 (six) hours as needed for wheezing or shortness of breath. 8 g 2   calcium carbonate (TUMS EX) 750 MG chewable tablet Chew 2 tablets by mouth as needed for heartburn.     Cholecalciferol 1.25 MG (50000 UT) TABS Take 1 tablet by mouth once a week.     cyclobenzaprine (FLEXERIL) 5 MG tablet  Take 5 mg by mouth 3 (three) times daily.     famotidine (PEPCID) 20 MG tablet TAKE 1 TABLET BY MOUTH TWICE A DAY 180 tablet 1   gabapentin (NEURONTIN) 600 MG tablet Take 600 mg by mouth at bedtime.     Lactobacillus-Inulin (CULTURELLE DIGESTIVE DAILY PO) Take 1 tablet by mouth daily.     meloxicam (MOBIC) 15 MG tablet Take 15 mg by mouth daily.     Potassium Citrate 15 MEQ (1620 MG) TBCR Take 2 tablets by mouth 2 (two) times daily. 120 tablet 3   Cholecalciferol 1.25 MG (50000 UT) capsule Take 1 capsule (50,000 Units total) by mouth daily. (Patient taking differently: Take 50,000 Units by mouth once a week.) 12 capsule 4   cyclobenzaprine (FLEXERIL) 10 MG tablet Take 10 mg by mouth at bedtime.     fluticasone (FLONASE) 50 MCG/ACT nasal spray Place 2 sprays into both nostrils daily. 16 g 0   No facility-administered medications prior to visit.     Per HPI unless specifically indicated in ROS section below Review of Systems  Constitutional:  Positive for fatigue and fever.  HENT:  Positive for ear pain, sinus pressure, sinus pain and sore throat. Negative for congestion.   Eyes:  Negative for pain.  Respiratory:  Positive for cough. Negative for shortness of breath and wheezing.   Cardiovascular:  Negative for chest pain, palpitations and leg swelling.  Gastrointestinal:  Negative for abdominal pain.  Genitourinary:  Negative for dysuria and vaginal bleeding.  Musculoskeletal:  Negative for back pain.  Neurological:  Negative for syncope, light-headedness and headaches.  Psychiatric/Behavioral:  Negative for dysphoric mood.    Objective:  BP 123/82   Ht 5\' 1"  (1.549 m)   Wt 200 lb (90.7 kg)   LMP 09/29/2020 (Approximate) Comment: PCOS  BMI 37.79 kg/m   Wt Readings from Last 3 Encounters:  02/15/23 200 lb (90.7 kg)  12/29/22 205 lb 6 oz (93.2 kg)  07/02/22 200 lb (90.7 kg)     Physical Exam Constitutional:      General: The patient is not in acute distress. Pulmonary:      Effort: Pulmonary effort is normal. No respiratory distress.  Neurological:     Mental Status: The patient is alert and oriented to person, place, and time.  Psychiatric:        Mood and Affect: Mood normal.        Behavior: Behavior normal.       Results for orders placed or performed in visit on 06/24/22  Microscopic Examination   Collection Time: 06/24/22  1:00 PM   Urine  Result Value Ref Range   WBC, UA 6-10 (A) 0 - 5 /hpf   RBC, Urine >30 (A) 0 - 2 /hpf   Epithelial Cells (non renal) 0-10 0 - 10 /hpf   Casts Present (A) None seen /lpf   Cast Type Granular casts (A) N/A   Crystals Present (A) N/A   Crystal Type Calcium Oxalate N/A   Mucus, UA Present (A) Not Estab.   Bacteria, UA Moderate (A) None seen/Few  Urinalysis, Complete   Collection Time: 06/24/22  1:00 PM  Result Value Ref Range   Specific Gravity, UA >1.030 (H) 1.005 - 1.030   pH, UA 5.5 5.0 - 7.5   Color, UA Amber (A) Yellow   Appearance Ur Clear Clear   Leukocytes,UA Trace (A) Negative   Protein,UA 2+ (A) Negative/Trace   Glucose, UA Trace (A) Negative   Ketones, UA Negative Negative   RBC, UA 3+ (A) Negative   Bilirubin, UA Negative Negative   Urobilinogen, Ur 0.2 0.2 - 1.0 mg/dL   Nitrite, UA Positive (A) Negative   Microscopic Examination See below:   CULTURE, URINE COMPREHENSIVE   Collection Time: 06/24/22  4:12 PM   Specimen: Urine   UR  Result Value Ref Range   Urine Culture, Comprehensive Final report    Organism ID, Bacteria Comment     Assessment and Plan  There are no diagnoses linked to this encounter.  No follow-ups on file.   Kayla Nora, MD

## 2023-02-15 NOTE — Assessment & Plan Note (Signed)
No current sign of asthma exacerbation.

## 2023-03-15 ENCOUNTER — Other Ambulatory Visit: Payer: Self-pay | Admitting: Physician Assistant

## 2023-03-15 DIAGNOSIS — R109 Unspecified abdominal pain: Secondary | ICD-10-CM

## 2023-03-17 ENCOUNTER — Ambulatory Visit: Payer: Commercial Managed Care - PPO | Admitting: Physician Assistant

## 2023-03-17 ENCOUNTER — Ambulatory Visit: Payer: Commercial Managed Care - PPO

## 2023-03-18 ENCOUNTER — Ambulatory Visit: Admission: RE | Admit: 2023-03-18 | Payer: Commercial Managed Care - PPO | Source: Ambulatory Visit

## 2023-03-21 ENCOUNTER — Ambulatory Visit: Payer: Commercial Managed Care - PPO | Admitting: Physician Assistant

## 2023-03-21 ENCOUNTER — Ambulatory Visit
Admission: RE | Admit: 2023-03-21 | Discharge: 2023-03-21 | Disposition: A | Discharge status: Home / Self Care | Payer: Commercial Managed Care - PPO | Source: Ambulatory Visit | Attending: Physician Assistant | Admitting: Physician Assistant

## 2023-03-21 ENCOUNTER — Other Ambulatory Visit: Payer: Self-pay

## 2023-03-21 ENCOUNTER — Telehealth: Payer: Self-pay

## 2023-03-21 VITALS — BP 128/91 | HR 61 | Temp 98.7°F | Ht 61.0 in | Wt 203.0 lb

## 2023-03-21 DIAGNOSIS — N3942 Incontinence without sensory awareness: Secondary | ICD-10-CM

## 2023-03-21 DIAGNOSIS — N200 Calculus of kidney: Secondary | ICD-10-CM

## 2023-03-21 DIAGNOSIS — Z87442 Personal history of urinary calculi: Secondary | ICD-10-CM | POA: Insufficient documentation

## 2023-03-21 DIAGNOSIS — R109 Unspecified abdominal pain: Secondary | ICD-10-CM | POA: Insufficient documentation

## 2023-03-21 LAB — URINALYSIS, COMPLETE
Bilirubin, UA: NEGATIVE
Glucose, UA: NEGATIVE
Ketones, UA: NEGATIVE
Nitrite, UA: NEGATIVE
Protein,UA: NEGATIVE
Specific Gravity, UA: >1.03 — ABNORMAL HIGH (ref 1.005–1.030)
Urobilinogen, Ur: 0.2 mg/dL (ref 0.2–1.0)
pH, UA: 5.5 (ref 5.0–7.5)

## 2023-03-21 LAB — MICROSCOPIC EXAMINATION: Epithelial Cells (non renal): >10 /[HPF] — AB (ref 0–10)

## 2023-03-21 MED ORDER — CEPHALEXIN 500 MG PO CAPS
500.0000 mg | ORAL_CAPSULE | Freq: Two times a day (BID) | ORAL | 0 refills | Status: AC
Start: 2023-03-21 — End: 2023-03-26

## 2023-03-21 MED ORDER — GEMTESA 75 MG PO TABS
75.0000 mg | ORAL_TABLET | Freq: Every day | ORAL | Status: DC
Start: 2023-03-21 — End: 2023-09-09

## 2023-03-21 NOTE — Progress Notes (Signed)
 Surgical Physician Order Form [facility]   Dr. Richardo Hanks * Scheduling expectation : 03/25/2023  *Length of Case:   *Clearance needed: no  *Anticoagulation Instructions: N/A  *Aspirin Instructions: N/A  *Post-op visit Date/Instructions:  1 month with RUS prior  *Diagnosis: Left Nephrolithiasis  *Procedure: left Ureteroscopy w/laser lithotripsy & stent placement ([address])   Additional orders: N/A  -Admit type: OUTpatient  -Anesthesia: General  -VTE Prophylaxis Standing Order SCD's       Other:   -Standing Lab Orders Per Anesthesia    Lab other: None  -Standing Test orders EKG/Chest x-ray per Anesthesia       Test other:   - Medications:  Ancef 2gm IV  -Other orders:  N/A

## 2023-03-21 NOTE — Progress Notes (Signed)
   [facility] Surgical Posting Form  Surgery Date: Date: 03/25/2023  Surgeon: Dr. Legrand Rams, MD  Inpt ( No  )   Outpt (Yes)   Obs ( No  )   Diagnosis: N20.0 Left Nephrolithiasis  -CPT: [phone]  Surgery: Left Ureteroscopy with Laser Lithotripsy and Stent Placement   Stop Anticoagulations: No  Cardiac/Medical/Pulmonary Clearance needed: no  *Orders entered into EPIC  Date: 03/21/23   *Case booked in [place]  Date: 03/21/23  *Notified pt of Surgery: Date: 03/21/23  PRE-OP UA & CX: no  *Placed into Prior Authorization Work Bee Date: 03/21/23  Assistant/laser/rep:No

## 2023-03-21 NOTE — Progress Notes (Signed)
 03/21/2023 1:31 PM   Kayla Sexton 21-Jul-1980 [id number]  CC: Chief Complaint  Patient presents with   Abdominal Pain   HPI: Kayla Sexton is a 43 y.o. female with PMH recurrent uric acid nephrolithiasis on potassium citrate s/p multiple ureteroscopies who has not tolerated stents well who presents today for evaluation of a possible acute stone episode.   Today she reports an approximate 2-week history of left flank pain and nausea.  Pain radiates to the LUQ.  She denies fevers.  She stopped potassium citrate about a month ago.  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  She wonders if there are any alternatives including allopurinol, since she feels that the potassium citrate is not really working for her either.  She also reports urinary leakage without awareness and stress incontinence since undergoing hysterectomy and gaining some weight.  She will occasionally feel bulging/pressure in the vagina.  She denies urge incontinence.  CT stone study from this morning shows no acute obstruction, however she has an 8mm left renal pelvis stone and 6mm left lower pole stone. Stones are low sensity, <500HU.  In-office UA today positive for 1+ blood, pH 5.5, and trace leukocytes; urine microscopy with 11-30 WBCs/HPF, 11-30 RBCs/HPF, >10 epithelial cells/hpf, and many bacteria.  PMH: Past Medical History:  Diagnosis Date   Anemia    Asthma    Cancer (HCC) 2006   CERVICAL    CIN III (cervical intraepithelial neoplasia III)    Dysuria    Family history of adverse reaction to anesthesia    DAD AND BROTHER AND MOM-N/V   GERD (gastroesophageal reflux disease)    OCC   Hematuria    HH (hiatus hernia)    History of cervical dysplasia 2006   CIN1 and CIN2   History of kidney stones    Migraines    PCOS (polycystic ovarian syndrome)    Dr. Vincente Poli   PONV (postoperative  nausea and vomiting)    HARD TO WAKE UP   Right ureteral calculus    Sleep apnea    DOES NOT USE CPAP    Surgical History: Past Surgical History:  Procedure Laterality Date   CERVICAL BIOPSY  W/ LOOP ELECTRODE EXCISION  12-04-2004   dr Vincente Poli  [facility]   COLONOSCOPY  03/2008   Dr. Bosie Clos   COMBINED HYSTEROSCOPY DIAGNOSTIC / D&C  02-28-2012   dr Vincente Poli   @ [facility]   CYSTOSCOPY W/ RETROGRADES Right 03/31/2018   Procedure: CYSTOSCOPY WITH RETROGRADE PYELOGRAM;  Surgeon: Sondra Come, MD;  Location: [facility];  Service: Urology;  Laterality: Right;   CYSTOSCOPY W/ RETROGRADES  11/11/2020   Procedure: CYSTOSCOPY WITH RETROGRADE PYELOGRAM;  Surgeon: Sondra Come, MD;  Location: [facility];  Service: Urology;;   CYSTOSCOPY WITH RETROGRADE PYELOGRAM, URETEROSCOPY AND STENT PLACEMENT Bilateral 07/21/2017   Procedure: CYSTOSCOPY WITH BILATERAL  RETROGRADE BILATERAL URETEROSCOPY AND STENTS  PLACEMENT;  Surgeon: Bjorn Pippin, MD;  Location[facility];  Service: Urology;  Laterality: Bilateral;   CYSTOSCOPY WITH RETROGRADE URETHROGRAM Left 01/04/2020   Procedure: CYSTOSCOPY WITH RETROGRADE URETHROGRAM;  Surgeon: Sondra Come, MD;  Location: [facility];  Service: Urology;  Laterality: Left;   CYSTOSCOPY/URETEROSCOPY/HOLMIUM LASER/STENT PLACEMENT Left 03/17/2018   Procedure: CYSTOSCOPY/URETEROSCOPY/HOLMIUM LASER/STENT PLACEMENT;  Surgeon: Sondra Come, MD;  Location: [facility];  Service: Urology;  Laterality: Left;   CYSTOSCOPY/URETEROSCOPY/HOLMIUM LASER/STENT PLACEMENT  Right 03/31/2018   Procedure: CYSTOSCOPY/URETEROSCOPY/HOLMIUM LASER/STENT PLACEMENT;  Surgeon: Sondra Come, MD;  Location: [facility];  Service: Urology;  Laterality: Right;   CYSTOSCOPY/URETEROSCOPY/HOLMIUM LASER/STENT PLACEMENT Left 01/04/2020   Procedure: CYSTOSCOPY/URETEROSCOPY/HOLMIUM LASER;  Surgeon: Sondra Come, MD;  Location: [facility];  Service: Urology;  Laterality: Left;    CYSTOSCOPY/URETEROSCOPY/HOLMIUM LASER/STENT PLACEMENT Left 11/11/2020   Procedure: CYSTOSCOPY/URETEROSCOPY/HOLMIUM LASER;  Surgeon: Sondra Come, MD;  Location: [facility];  Service: Urology;  Laterality: Left;   CYSTOSCOPY/URETEROSCOPY/HOLMIUM LASER/STENT PLACEMENT Left 07/02/2022   Procedure: CYSTOSCOPY/URETEROSCOPY/HOLMIUM LASER FOR STONE;  Surgeon: Sondra Come, MD;  Location: [facility];  Service: Urology;  Laterality: Left;   DILATION AND CURETTAGE OF UTERUS     endoscopy  2010   Dr. Madilyn Fireman   EXTRACORPOREAL SHOCK WAVE LITHOTRIPSY  08/2021   FINGER SURGERY Right    MIDDLE FINGER   FOOT SURGERY Left 2015   5th bunionectomy w/ 2nd digit w/ fusion , retained hardware w/ 5th toe   FRACTURE SURGERY     left shoulder   HOLMIUM LASER APPLICATION Bilateral 07/21/2017   Procedure: HOLMIUM LASER APPLICATION;  Surgeon: Bjorn Pippin, MD;  Location: [facility];  Service: Urology;  Laterality: Bilateral;    Home Medications:  Allergies as of 03/21/2023       Reactions   Adhesive [tape] Swelling   Per pt only the 12 lead ekg pads cause "welps and abrasions"-ALSO SOME BANDAIDS CAUSE IRRITATION-OK TO USE PAPER TAPE   Metformin And Related Other (See Comments)   GI upset    Other    RED MEAT-ANAPHYLAXIS General Anesthesia - nausea and vomiting (scopolamine patch doesn't work)   Vancomycin Hives, Itching   Wound Dressing Adhesive    Bactrim [sulfamethoxazole-trimethoprim] Rash        Medication List        Accurate as of March 21, 2023  1:31 PM. If you have any questions, ask your nurse or doctor.          albuterol 108 (90 Base) MCG/ACT inhaler Commonly known as: VENTOLIN HFA Inhale 2 puffs into the lungs every 6 (six) hours as needed for wheezing or shortness of breath.   amoxicillin 500 MG capsule Commonly known as: AMOXIL Take 2 capsules (1,000 mg total) by mouth 2 (two) times daily.   calcium carbonate 750 MG chewable tablet Commonly known as: TUMS  EX Chew 2 tablets by mouth as needed for heartburn.   Cholecalciferol 1.25 MG (50000 UT) Tabs Take 1 tablet by mouth once a week.   CULTURELLE DIGESTIVE DAILY PO Take 1 tablet by mouth daily.   cyclobenzaprine 5 MG tablet Commonly known as: FLEXERIL Take 5 mg by mouth 3 (three) times daily.   famotidine 20 MG tablet Commonly known as: PEPCID TAKE 1 TABLET BY MOUTH TWICE A DAY   fluconazole 150 MG tablet Commonly known as: DIFLUCAN Take 1 tablet if symptoms of vaginal yeast infection associated with antibiotic use, may repeat at the end of the course of antibiotics if needed.   gabapentin 600 MG tablet Commonly known as: NEURONTIN Take 600 mg by mouth at bedtime.   meloxicam 15 MG tablet Commonly known as: MOBIC Take 15 mg by mouth daily.   Potassium Citrate 15 MEQ (1620 MG) Tbcr Take 2 tablets by mouth 2 (two) times daily.        Allergies:  Allergies  Allergen Reactions   Adhesive [Tape] Swelling    Per pt only the 12 lead ekg pads cause "welps and  abrasions"-ALSO SOME BANDAIDS CAUSE IRRITATION-OK TO USE PAPER TAPE   Metformin And Related Other (See Comments)    GI upset    Other     RED MEAT-ANAPHYLAXIS  General Anesthesia - nausea and vomiting (scopolamine patch doesn't work)   Vancomycin Hives and Itching   Wound Dressing Adhesive    Bactrim [Sulfamethoxazole-Trimethoprim] Rash    Family History: Family History  Problem Relation Age of Onset   Healthy Mother    Healthy Father    Breast cancer Paternal Grandmother    Breast cancer Other        Maternal Great Aunt    Social History:   reports that she has never smoked. She has never been exposed to tobacco smoke. She has never used smokeless tobacco. She reports that she does not drink alcohol and does not use drugs.  Physical Exam: LMP 09/29/2020 (Approximate) Comment: PCOS  Constitutional:  Alert and oriented, no acute distress, nontoxic appearing HEENT: [place], AT Cardiovascular: No clubbing,  cyanosis, or edema Respiratory: Normal respiratory effort, no increased work of breathing Skin: No rashes, bruises or suspicious lesions Neurologic: Grossly intact, no focal deficits, moving all 4 extremities Psychiatric: Normal mood and affect  Laboratory Data: See epic  Pertinent Imaging: Results for orders placed during the hospital encounter of 03/21/23  CT RENAL STONE STUDY  Narrative CLINICAL DATA:  Left flank pain  EXAM: CT ABDOMEN AND PELVIS WITHOUT CONTRAST  TECHNIQUE: Multidetector CT imaging of the abdomen and pelvis was performed following the standard protocol without IV contrast.  RADIATION DOSE REDUCTION: This exam was performed according to the departmental dose-optimization program which includes automated exposure control, adjustment of the mA and/or kV according to patient size and/or use of iterative reconstruction technique.  COMPARISON:  06/24/2022  FINDINGS: Lower chest: Included lung bases are clear.  Heart size is normal.  Hepatobiliary: Unremarkable unenhanced appearance of the liver. No focal liver lesion identified. Gallbladder within normal limits. No hyperdense gallstone. No biliary dilatation.  Pancreas: Unremarkable. No pancreatic ductal dilatation or surrounding inflammatory changes.  Spleen: Normal in size without focal abnormality.  Adrenals/Urinary Tract: Unremarkable adrenal glands. 6 x 4 mm stone within the left renal pelvis. Additional 4 mm stone within the lower pole of the left kidney. Single tiny punctate stone within the lower pole of the right kidney. There is no hydronephrosis. No ureteral calculi. Urinary bladder within normal limits. No stones within the bladder lumen.  Stomach/Bowel: Stomach is within normal limits. Appendix appears normal (series 2, image 59). No evidence of bowel wall thickening, distention, or inflammatory changes.  Vascular/Lymphatic: No significant vascular findings are present. No enlarged  abdominal or pelvic lymph nodes.  Reproductive: Status post hysterectomy. No adnexal masses.  Other: No free fluid. No abdominopelvic fluid collection. No pneumoperitoneum. No abdominal wall hernia.  Musculoskeletal: No acute or significant osseous findings.  IMPRESSION: 1. Bilateral nephrolithiasis including a 6 x 4 mm stone within the left renal pelvis. No hydronephrosis. 2. Otherwise, no acute abdominopelvic findings.   Electronically Signed By: Duanne Guess D.O. On: 03/21/2023 08:44  I personally reviewed the images referenced above and note left nephrolithiasis x2 with no acute obstruction.  Assessment & Plan:   1. Flank pain with history of urolithiasis (Primary) 2 large left renal stones, suspect the 6mm stone is ball-valving and causing intermittent obstruction. UA is suspicious but contaminated and she is not clinically infected today. Urine pH is low and stones are low density; suspect uric acid stones consistent with her stone history.  I offered her left ureteroscopy with Dr. Richardo Hanks this Friday and she agreed. We discussed that he will try not to leave a stent if possible, but if he does place a stent he'll leave it on a dangler and she'll be able to remove it on her own. She is in agreement with this plan.  Sending urine for preop culture today, will start empiric Keflex out of an abundance of caution.  Will see her back in clinic 4 weeks postop with RUS prior.  We discussed that allopurinol is not likely to treat her stones given low urine pH. I have her LithoLyte packets to try as an alternative to potassium citrate pills. - Urinalysis, Complete - CULTURE, URINE COMPREHENSIVE - Ambulatory Referral For Surgery Scheduling - US RENAL; Future - cephALEXin (KEFLEX) 500 MG capsule; Take 1 capsule (500 mg total) by mouth 2 (two) times daily for 5 days.  Dispense: 10 capsule; Refill: 0  2. Urinary incontinence without sensory awareness Mixed urinary incontinence  without awareness and stress incontinence s/p hysterectomy.  She feels occasional bulging/pressure in the vagina consistent with likely cystocele.  We discussed the limited role of pharmacotherapy in these forms of urinary incontinence, however we will proceed with a trial of Gemtesa to see if we can medically optimize her.  Ultimately, I would like her to see Dr. Sherron Monday for consideration of surgical intervention if appropriate.  Notably, she will be a poor candidate for pelvic floor PT due to her work schedule. - Vibegron (GEMTESA) 75 MG TABS; Take 1 tablet (75 mg total) by mouth daily.   Return in about 4 weeks (around 04/18/2023) for Postop f/u with RUS prior; + incontinence eval with Dr. Sherron Monday.  Carman Ching, PA-C  [facility] [place] [address], [address] [place], [place] [address] [phone]

## 2023-03-21 NOTE — H&P (View-Only) (Signed)
 03/21/2023 1:31 PM   Kayla Sexton 21-Jul-1980 [id number]  CC: Chief Complaint  Patient presents with   Abdominal Pain   HPI: Kayla Sexton is a 43 y.o. female with PMH recurrent uric acid nephrolithiasis on potassium citrate s/p multiple ureteroscopies who has not tolerated stents well who presents today for evaluation of a possible acute stone episode.   Today she reports an approximate 2-week history of left flank pain and nausea.  Pain radiates to the LUQ.  She denies fevers.  She stopped potassium citrate about a month ago.  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  She wonders if there are any alternatives including allopurinol, since she feels that the potassium citrate is not really working for her either.  She also reports urinary leakage without awareness and stress incontinence since undergoing hysterectomy and gaining some weight.  She will occasionally feel bulging/pressure in the vagina.  She denies urge incontinence.  CT stone study from this morning shows no acute obstruction, however she has an 8mm left renal pelvis stone and 6mm left lower pole stone. Stones are low sensity, <500HU.  In-office UA today positive for 1+ blood, pH 5.5, and trace leukocytes; urine microscopy with 11-30 WBCs/HPF, 11-30 RBCs/HPF, >10 epithelial cells/hpf, and many bacteria.  PMH: Past Medical History:  Diagnosis Date   Anemia    Asthma    Cancer (HCC) 2006   CERVICAL    CIN III (cervical intraepithelial neoplasia III)    Dysuria    Family history of adverse reaction to anesthesia    DAD AND BROTHER AND MOM-N/V   GERD (gastroesophageal reflux disease)    OCC   Hematuria    HH (hiatus hernia)    History of cervical dysplasia 2006   CIN1 and CIN2   History of kidney stones    Migraines    PCOS (polycystic ovarian syndrome)    Dr. Vincente Poli   PONV (postoperative  nausea and vomiting)    HARD TO WAKE UP   Right ureteral calculus    Sleep apnea    DOES NOT USE CPAP    Surgical History: Past Surgical History:  Procedure Laterality Date   CERVICAL BIOPSY  W/ LOOP ELECTRODE EXCISION  12-04-2004   dr Vincente Poli  [facility]   COLONOSCOPY  03/2008   Dr. Bosie Clos   COMBINED HYSTEROSCOPY DIAGNOSTIC / D&C  02-28-2012   dr Vincente Poli   @ [facility]   CYSTOSCOPY W/ RETROGRADES Right 03/31/2018   Procedure: CYSTOSCOPY WITH RETROGRADE PYELOGRAM;  Surgeon: Sondra Come, MD;  Location: [facility];  Service: Urology;  Laterality: Right;   CYSTOSCOPY W/ RETROGRADES  11/11/2020   Procedure: CYSTOSCOPY WITH RETROGRADE PYELOGRAM;  Surgeon: Sondra Come, MD;  Location: [facility];  Service: Urology;;   CYSTOSCOPY WITH RETROGRADE PYELOGRAM, URETEROSCOPY AND STENT PLACEMENT Bilateral 07/21/2017   Procedure: CYSTOSCOPY WITH BILATERAL  RETROGRADE BILATERAL URETEROSCOPY AND STENTS  PLACEMENT;  Surgeon: Bjorn Pippin, MD;  Location[facility];  Service: Urology;  Laterality: Bilateral;   CYSTOSCOPY WITH RETROGRADE URETHROGRAM Left 01/04/2020   Procedure: CYSTOSCOPY WITH RETROGRADE URETHROGRAM;  Surgeon: Sondra Come, MD;  Location: [facility];  Service: Urology;  Laterality: Left;   CYSTOSCOPY/URETEROSCOPY/HOLMIUM LASER/STENT PLACEMENT Left 03/17/2018   Procedure: CYSTOSCOPY/URETEROSCOPY/HOLMIUM LASER/STENT PLACEMENT;  Surgeon: Sondra Come, MD;  Location: [facility];  Service: Urology;  Laterality: Left;   CYSTOSCOPY/URETEROSCOPY/HOLMIUM LASER/STENT PLACEMENT  Right 03/31/2018   Procedure: CYSTOSCOPY/URETEROSCOPY/HOLMIUM LASER/STENT PLACEMENT;  Surgeon: Sondra Come, MD;  Location: [facility];  Service: Urology;  Laterality: Right;   CYSTOSCOPY/URETEROSCOPY/HOLMIUM LASER/STENT PLACEMENT Left 01/04/2020   Procedure: CYSTOSCOPY/URETEROSCOPY/HOLMIUM LASER;  Surgeon: Sondra Come, MD;  Location: [facility];  Service: Urology;  Laterality: Left;    CYSTOSCOPY/URETEROSCOPY/HOLMIUM LASER/STENT PLACEMENT Left 11/11/2020   Procedure: CYSTOSCOPY/URETEROSCOPY/HOLMIUM LASER;  Surgeon: Sondra Come, MD;  Location: [facility];  Service: Urology;  Laterality: Left;   CYSTOSCOPY/URETEROSCOPY/HOLMIUM LASER/STENT PLACEMENT Left 07/02/2022   Procedure: CYSTOSCOPY/URETEROSCOPY/HOLMIUM LASER FOR STONE;  Surgeon: Sondra Come, MD;  Location: [facility];  Service: Urology;  Laterality: Left;   DILATION AND CURETTAGE OF UTERUS     endoscopy  2010   Dr. Madilyn Fireman   EXTRACORPOREAL SHOCK WAVE LITHOTRIPSY  08/2021   FINGER SURGERY Right    MIDDLE FINGER   FOOT SURGERY Left 2015   5th bunionectomy w/ 2nd digit w/ fusion , retained hardware w/ 5th toe   FRACTURE SURGERY     left shoulder   HOLMIUM LASER APPLICATION Bilateral 07/21/2017   Procedure: HOLMIUM LASER APPLICATION;  Surgeon: Bjorn Pippin, MD;  Location: [facility];  Service: Urology;  Laterality: Bilateral;    Home Medications:  Allergies as of 03/21/2023       Reactions   Adhesive [tape] Swelling   Per pt only the 12 lead ekg pads cause "welps and abrasions"-ALSO SOME BANDAIDS CAUSE IRRITATION-OK TO USE PAPER TAPE   Metformin And Related Other (See Comments)   GI upset    Other    RED MEAT-ANAPHYLAXIS General Anesthesia - nausea and vomiting (scopolamine patch doesn't work)   Vancomycin Hives, Itching   Wound Dressing Adhesive    Bactrim [sulfamethoxazole-trimethoprim] Rash        Medication List        Accurate as of March 21, 2023  1:31 PM. If you have any questions, ask your nurse or doctor.          albuterol 108 (90 Base) MCG/ACT inhaler Commonly known as: VENTOLIN HFA Inhale 2 puffs into the lungs every 6 (six) hours as needed for wheezing or shortness of breath.   amoxicillin 500 MG capsule Commonly known as: AMOXIL Take 2 capsules (1,000 mg total) by mouth 2 (two) times daily.   calcium carbonate 750 MG chewable tablet Commonly known as: TUMS  EX Chew 2 tablets by mouth as needed for heartburn.   Cholecalciferol 1.25 MG (50000 UT) Tabs Take 1 tablet by mouth once a week.   CULTURELLE DIGESTIVE DAILY PO Take 1 tablet by mouth daily.   cyclobenzaprine 5 MG tablet Commonly known as: FLEXERIL Take 5 mg by mouth 3 (three) times daily.   famotidine 20 MG tablet Commonly known as: PEPCID TAKE 1 TABLET BY MOUTH TWICE A DAY   fluconazole 150 MG tablet Commonly known as: DIFLUCAN Take 1 tablet if symptoms of vaginal yeast infection associated with antibiotic use, may repeat at the end of the course of antibiotics if needed.   gabapentin 600 MG tablet Commonly known as: NEURONTIN Take 600 mg by mouth at bedtime.   meloxicam 15 MG tablet Commonly known as: MOBIC Take 15 mg by mouth daily.   Potassium Citrate 15 MEQ (1620 MG) Tbcr Take 2 tablets by mouth 2 (two) times daily.        Allergies:  Allergies  Allergen Reactions   Adhesive [Tape] Swelling    Per pt only the 12 lead ekg pads cause "welps and  abrasions"-ALSO SOME BANDAIDS CAUSE IRRITATION-OK TO USE PAPER TAPE   Metformin And Related Other (See Comments)    GI upset    Other     RED MEAT-ANAPHYLAXIS  General Anesthesia - nausea and vomiting (scopolamine patch doesn't work)   Vancomycin Hives and Itching   Wound Dressing Adhesive    Bactrim [Sulfamethoxazole-Trimethoprim] Rash    Family History: Family History  Problem Relation Age of Onset   Healthy Mother    Healthy Father    Breast cancer Paternal Grandmother    Breast cancer Other        Maternal Great Aunt    Social History:   reports that she has never smoked. She has never been exposed to tobacco smoke. She has never used smokeless tobacco. She reports that she does not drink alcohol and does not use drugs.  Physical Exam: LMP 09/29/2020 (Approximate) Comment: PCOS  Constitutional:  Alert and oriented, no acute distress, nontoxic appearing HEENT: [place], AT Cardiovascular: No clubbing,  cyanosis, or edema Respiratory: Normal respiratory effort, no increased work of breathing Skin: No rashes, bruises or suspicious lesions Neurologic: Grossly intact, no focal deficits, moving all 4 extremities Psychiatric: Normal mood and affect  Laboratory Data: See epic  Pertinent Imaging: Results for orders placed during the hospital encounter of 03/21/23  CT RENAL STONE STUDY  Narrative CLINICAL DATA:  Left flank pain  EXAM: CT ABDOMEN AND PELVIS WITHOUT CONTRAST  TECHNIQUE: Multidetector CT imaging of the abdomen and pelvis was performed following the standard protocol without IV contrast.  RADIATION DOSE REDUCTION: This exam was performed according to the departmental dose-optimization program which includes automated exposure control, adjustment of the mA and/or kV according to patient size and/or use of iterative reconstruction technique.  COMPARISON:  06/24/2022  FINDINGS: Lower chest: Included lung bases are clear.  Heart size is normal.  Hepatobiliary: Unremarkable unenhanced appearance of the liver. No focal liver lesion identified. Gallbladder within normal limits. No hyperdense gallstone. No biliary dilatation.  Pancreas: Unremarkable. No pancreatic ductal dilatation or surrounding inflammatory changes.  Spleen: Normal in size without focal abnormality.  Adrenals/Urinary Tract: Unremarkable adrenal glands. 6 x 4 mm stone within the left renal pelvis. Additional 4 mm stone within the lower pole of the left kidney. Single tiny punctate stone within the lower pole of the right kidney. There is no hydronephrosis. No ureteral calculi. Urinary bladder within normal limits. No stones within the bladder lumen.  Stomach/Bowel: Stomach is within normal limits. Appendix appears normal (series 2, image 59). No evidence of bowel wall thickening, distention, or inflammatory changes.  Vascular/Lymphatic: No significant vascular findings are present. No enlarged  abdominal or pelvic lymph nodes.  Reproductive: Status post hysterectomy. No adnexal masses.  Other: No free fluid. No abdominopelvic fluid collection. No pneumoperitoneum. No abdominal wall hernia.  Musculoskeletal: No acute or significant osseous findings.  IMPRESSION: 1. Bilateral nephrolithiasis including a 6 x 4 mm stone within the left renal pelvis. No hydronephrosis. 2. Otherwise, no acute abdominopelvic findings.   Electronically Signed By: Duanne Guess D.O. On: 03/21/2023 08:44  I personally reviewed the images referenced above and note left nephrolithiasis x2 with no acute obstruction.  Assessment & Plan:   1. Flank pain with history of urolithiasis (Primary) 2 large left renal stones, suspect the 6mm stone is ball-valving and causing intermittent obstruction. UA is suspicious but contaminated and she is not clinically infected today. Urine pH is low and stones are low density; suspect uric acid stones consistent with her stone history.  I offered her left ureteroscopy with Dr. Richardo Hanks this Friday and she agreed. We discussed that he will try not to leave a stent if possible, but if he does place a stent he'll leave it on a dangler and she'll be able to remove it on her own. She is in agreement with this plan.  Sending urine for preop culture today, will start empiric Keflex out of an abundance of caution.  Will see her back in clinic 4 weeks postop with RUS prior.  We discussed that allopurinol is not likely to treat her stones given low urine pH. I have her LithoLyte packets to try as an alternative to potassium citrate pills. - Urinalysis, Complete - CULTURE, URINE COMPREHENSIVE - Ambulatory Referral For Surgery Scheduling - US RENAL; Future - cephALEXin (KEFLEX) 500 MG capsule; Take 1 capsule (500 mg total) by mouth 2 (two) times daily for 5 days.  Dispense: 10 capsule; Refill: 0  2. Urinary incontinence without sensory awareness Mixed urinary incontinence  without awareness and stress incontinence s/p hysterectomy.  She feels occasional bulging/pressure in the vagina consistent with likely cystocele.  We discussed the limited role of pharmacotherapy in these forms of urinary incontinence, however we will proceed with a trial of Gemtesa to see if we can medically optimize her.  Ultimately, I would like her to see Dr. Sherron Monday for consideration of surgical intervention if appropriate.  Notably, she will be a poor candidate for pelvic floor PT due to her work schedule. - Vibegron (GEMTESA) 75 MG TABS; Take 1 tablet (75 mg total) by mouth daily.   Return in about 4 weeks (around 04/18/2023) for Postop f/u with RUS prior; + incontinence eval with Dr. Sherron Monday.  Carman Ching, PA-C  [facility] [place] [address], [address] [place], [place] [address] [phone]

## 2023-03-21 NOTE — Telephone Encounter (Signed)
  Per Dr. Richardo Hanks, Patient is to be scheduled for  Left Ureteroscopy with Laser Lithotripsy and Stent Placement   Ms. Gianino was contacted and possible surgical dates were discussed, Friday February 28th, 2025 was agreed upon for surgery.   Patient was directed to call [phone] between 1-3pm the day before surgery to find out surgical arrival time.  Instructions were given not to eat or drink from midnight on the night before surgery and have a driver for the day of surgery. On the surgery day patient was instructed to enter through the Medical Mall entrance of [facility] report the Same Day Surgery desk.   Pre-Admit Testing will be in contact via phone to set up an interview with the anesthesia team to review your history and medications prior to surgery.   Reminder of this information was sent via MyChart to the patient.

## 2023-03-24 ENCOUNTER — Encounter
Admission: RE | Admit: 2023-03-24 | Discharge: 2023-03-24 | Disposition: A | Discharge status: Home / Self Care | Payer: Commercial Managed Care - PPO | Source: Ambulatory Visit | Attending: Urology | Admitting: Urology

## 2023-03-24 ENCOUNTER — Other Ambulatory Visit: Payer: Self-pay

## 2023-03-24 DIAGNOSIS — D649 Anemia, unspecified: Secondary | ICD-10-CM

## 2023-03-24 HISTORY — DX: Incontinence without sensory awareness: N39.42

## 2023-03-24 HISTORY — DX: Calculus of kidney: N20.0

## 2023-03-24 LAB — CULTURE, URINE COMPREHENSIVE

## 2023-03-24 MED ORDER — LACTATED RINGERS IV SOLN: INTRAVENOUS | Status: DC

## 2023-03-24 MED ORDER — CHLORHEXIDINE GLUCONATE 0.12 % MT SOLN
15.0000 mL | Freq: Once | OROMUCOSAL | Status: AC
  Administered 2023-03-25: 15 mL via OROMUCOSAL

## 2023-03-24 MED ORDER — CEFAZOLIN SODIUM-DEXTROSE 2-4 GM/100ML-% IV SOLN
2.0000 g | INTRAVENOUS | Status: AC
Start: 2023-03-24 — End: 2023-03-25
  Administered 2023-03-25: 2 g via INTRAVENOUS

## 2023-03-24 MED ORDER — ORAL CARE MOUTH RINSE
15.0000 mL | Freq: Once | OROMUCOSAL | Status: AC
Start: 2023-03-24 — End: 2023-03-25

## 2023-03-24 NOTE — Patient Instructions (Addendum)
 Your procedure is scheduled on: 03/25/23 - Friday Report to the Registration Desk on the 1st floor of the Medical Mall. To find out your arrival time, please call [phone] between 1PM - 3PM on: 03/24/23 - Thursday If your arrival time is 6:00 am, do not arrive before that time as the Medical Mall entrance doors do not open until 6:00 am.  REMEMBER: Instructions that are not followed completely may result in serious medical risk, up to and including death; or upon the discretion of your surgeon and anesthesiologist your surgery may need to be rescheduled.  Do not eat food or drink any liquids after midnight the night before surgery.  No gum chewing or hard candies.   One week prior to surgery: Stop Anti-inflammatories (NSAIDS) such as Advil, Aleve, Ibuprofen, Motrin, Naproxen, Naprosyn and Aspirin based products such as Excedrin, Goody's Powder, BC Powder.You may take Tylenol if needed for pain up until the day of surgery.  Stop ANY OVER THE COUNTER supplements until after surgery.   ON THE DAY OF SURGERY ONLY TAKE THESE MEDICATIONS WITH SIPS OF WATER:  cephALEXin (KEFLEX)  Vibegron (GEMTESA)  famotidine (PEPCID)    No Alcohol for 24 hours before or after surgery.  No Smoking including e-cigarettes for 24 hours before surgery.  No chewable tobacco products for at least 6 hours before surgery.  No nicotine patches on the day of surgery.  Do not use any "recreational" drugs for at least a week (preferably 2 weeks) before your surgery.  Please be advised that the combination of cocaine and anesthesia may have negative outcomes, up to and including death. If you test positive for cocaine, your surgery will be cancelled.  On the morning of surgery brush your teeth with toothpaste and water, you may rinse your mouth with mouthwash if you wish. Do not swallow any toothpaste or mouthwash.   Do not wear jewelry, make-up, hairpins, clips or nail polish.  For welded (permanent)  jewelry: bracelets, anklets, waist bands, etc.  Please have this removed prior to surgery.  If it is not removed, there is a chance that hospital personnel will need to cut it off on the day of surgery.  Do not wear lotions, powders, or perfumes.   Do not shave body hair from the neck down 48 hours before surgery.  Contact lenses, hearing aids and dentures may not be worn into surgery.  Do not bring valuables to the hospital. [facility] is not responsible for any missing/lost belongings or valuables.   Notify your doctor if there is any change in your medical condition (cold, fever, infection).  Wear comfortable clothing (specific to your surgery type) to the hospital.  After surgery, you can help prevent lung complications by doing breathing exercises.  Take deep breaths and cough every 1-2 hours. Your doctor may order a device called an Incentive Spirometer to help you take deep breaths. When coughing or sneezing, hold a pillow firmly against your incision with both hands. This is called "splinting." Doing this helps protect your incision. It also decreases belly discomfort.  If you are being admitted to the hospital overnight, leave your suitcase in the car. After surgery it may be brought to your room.  In case of increased patient census, it may be necessary for you, the patient, to continue your postoperative care in the Same Day Surgery department.  If you are being discharged the day of surgery, you will not be allowed to drive home. You will need a responsible individual  to drive you home and stay with you for 24 hours after surgery.   If you are taking public transportation, you will need to have a responsible individual with you.  Please call the Pre-admissions Testing Dept. at [phone] if you have any questions about these instructions.  Surgery Visitation Policy:  Patients having surgery or a procedure may have two visitors.  Children under the age of 29 must have  an adult with them who is not the patient.  Temporary Visitor Restrictions Due to increasing cases of flu, RSV and COVID-19: Children ages 66 and under will not be able to visit patients in [facility] hospitals under most circumstances.  Inpatient Visitation:    Visiting hours are 7 a.m. to 8 p.m. Up to four visitors are allowed at one time in a patient room. The visitors may rotate out with other people during the day.  One visitor age 40 or older may stay with the patient overnight and must be in the room by 8 p.m.

## 2023-03-25 ENCOUNTER — Other Ambulatory Visit: Payer: Self-pay

## 2023-03-25 ENCOUNTER — Encounter: Payer: Self-pay | Admitting: Urology

## 2023-03-25 ENCOUNTER — Encounter
Admission: RE | Disposition: A | Discharge status: Home / Self Care | Payer: Self-pay | Source: Home / Self Care | Attending: Urology

## 2023-03-25 ENCOUNTER — Ambulatory Visit: Payer: Commercial Managed Care - PPO | Admitting: Anesthesiology

## 2023-03-25 ENCOUNTER — Ambulatory Visit
Admission: RE | Admit: 2023-03-25 | Discharge: 2023-03-25 | Disposition: A | Discharge status: Home / Self Care | Payer: Commercial Managed Care - PPO | Attending: Urology | Admitting: Urology

## 2023-03-25 ENCOUNTER — Ambulatory Visit: Payer: Commercial Managed Care - PPO

## 2023-03-25 DIAGNOSIS — D649 Anemia, unspecified: Secondary | ICD-10-CM

## 2023-03-25 DIAGNOSIS — Z6838 Body mass index (BMI) 38.0-38.9, adult: Secondary | ICD-10-CM | POA: Diagnosis not present

## 2023-03-25 DIAGNOSIS — J45909 Unspecified asthma, uncomplicated: Secondary | ICD-10-CM | POA: Diagnosis not present

## 2023-03-25 DIAGNOSIS — N3942 Incontinence without sensory awareness: Secondary | ICD-10-CM | POA: Insufficient documentation

## 2023-03-25 DIAGNOSIS — E66813 Obesity, class 3: Secondary | ICD-10-CM | POA: Insufficient documentation

## 2023-03-25 DIAGNOSIS — K219 Gastro-esophageal reflux disease without esophagitis: Secondary | ICD-10-CM | POA: Insufficient documentation

## 2023-03-25 DIAGNOSIS — N200 Calculus of kidney: Secondary | ICD-10-CM | POA: Diagnosis present

## 2023-03-25 DIAGNOSIS — G4730 Sleep apnea, unspecified: Secondary | ICD-10-CM | POA: Insufficient documentation

## 2023-03-25 HISTORY — PX: CYSTOSCOPY/URETEROSCOPY/HOLMIUM LASER/STENT PLACEMENT: SHX6546

## 2023-03-25 LAB — CBC
HCT: 44.6 % (ref 36.0–46.0)
Hemoglobin: 14.7 g/dL (ref 12.0–15.0)
MCH: 28.3 pg (ref 26.0–34.0)
MCHC: 33 g/dL (ref 30.0–36.0)
MCV: 85.9 fL (ref 80.0–100.0)
Platelets: 294 10*3/uL (ref 150–400)
RBC: 5.19 MIL/uL — ABNORMAL HIGH (ref 3.87–5.11)
RDW: 12.9 % (ref 11.5–15.5)
WBC: 7.6 10*3/uL (ref 4.0–10.5)
nRBC: 0 % (ref 0.0–0.2)

## 2023-03-25 SURGERY — CYSTOSCOPY/URETEROSCOPY/HOLMIUM LASER/STENT PLACEMENT
Anesthesia: General | Site: Ureter | Laterality: Left

## 2023-03-25 MED ORDER — OXYCODONE HCL 5 MG PO TABS
5.0000 mg | ORAL_TABLET | Freq: Once | ORAL | Status: AC | PRN
  Administered 2023-03-25: 5 mg via ORAL

## 2023-03-25 MED ORDER — LIDOCAINE HCL (PF) 2 % IJ SOLN
INTRAMUSCULAR | Status: AC
  Filled 2023-03-25: qty 5

## 2023-03-25 MED ORDER — OXYCODONE HCL 5 MG PO TABS
ORAL_TABLET | ORAL | Status: AC
  Filled 2023-03-25: qty 1

## 2023-03-25 MED ORDER — SUGAMMADEX SODIUM 200 MG/2ML IV SOLN
INTRAVENOUS | Status: AC
  Filled 2023-03-25: qty 2

## 2023-03-25 MED ORDER — MIDAZOLAM HCL 2 MG/2ML IJ SOLN
INTRAMUSCULAR | Status: AC
  Filled 2023-03-25: qty 2

## 2023-03-25 MED ORDER — DEXAMETHASONE SODIUM PHOSPHATE 10 MG/ML IJ SOLN
INTRAMUSCULAR | Status: DC | PRN
  Administered 2023-03-25: 8 mg via INTRAVENOUS

## 2023-03-25 MED ORDER — FENTANYL CITRATE (PF) 100 MCG/2ML IJ SOLN
INTRAMUSCULAR | Status: AC
  Filled 2023-03-25: qty 2

## 2023-03-25 MED ORDER — LIDOCAINE HCL (CARDIAC) PF 100 MG/5ML IV SOSY
PREFILLED_SYRINGE | INTRAVENOUS | Status: DC | PRN
  Administered 2023-03-25: 100 mg via INTRAVENOUS

## 2023-03-25 MED ORDER — PROPOFOL 10 MG/ML IV BOLUS
INTRAVENOUS | Status: DC | PRN
  Administered 2023-03-25: 30 mg via INTRAVENOUS
  Administered 2023-03-25: 150 mg via INTRAVENOUS

## 2023-03-25 MED ORDER — ROCURONIUM BROMIDE 10 MG/ML (PF) SYRINGE
PREFILLED_SYRINGE | INTRAVENOUS | Status: AC
  Filled 2023-03-25: qty 10

## 2023-03-25 MED ORDER — FENTANYL CITRATE (PF) 100 MCG/2ML IJ SOLN
25.0000 ug | INTRAMUSCULAR | Status: DC | PRN
  Administered 2023-03-25 (×4): 25 ug via INTRAVENOUS

## 2023-03-25 MED ORDER — OXYCODONE HCL 5 MG/5ML PO SOLN: 5.0000 mg | Freq: Once | ORAL | Status: AC | PRN

## 2023-03-25 MED ORDER — ONDANSETRON HCL 4 MG/2ML IJ SOLN: 4.0000 mg | Freq: Once | INTRAMUSCULAR | Status: DC | PRN

## 2023-03-25 MED ORDER — SODIUM CHLORIDE 0.9 % IR SOLN
Status: DC | PRN
  Administered 2023-03-25: 3000 mL

## 2023-03-25 MED ORDER — MIDAZOLAM HCL 2 MG/2ML IJ SOLN
INTRAMUSCULAR | Status: DC | PRN
  Administered 2023-03-25: 2 mg via INTRAVENOUS

## 2023-03-25 MED ORDER — ROCURONIUM BROMIDE 100 MG/10ML IV SOLN
INTRAVENOUS | Status: DC | PRN
  Administered 2023-03-25: 50 mg via INTRAVENOUS

## 2023-03-25 MED ORDER — DIPHENHYDRAMINE HCL 50 MG/ML IJ SOLN
INTRAMUSCULAR | Status: AC
  Filled 2023-03-25: qty 1

## 2023-03-25 MED ORDER — IOHEXOL 180 MG/ML  SOLN
INTRAMUSCULAR | Status: DC | PRN
  Administered 2023-03-25: 10 mL

## 2023-03-25 MED ORDER — FENTANYL CITRATE (PF) 100 MCG/2ML IJ SOLN
INTRAMUSCULAR | Status: DC | PRN
  Administered 2023-03-25: 75 ug via INTRAVENOUS
  Administered 2023-03-25: 25 ug via INTRAVENOUS

## 2023-03-25 MED ORDER — DEXMEDETOMIDINE HCL IN NACL 80 MCG/20ML IV SOLN
INTRAVENOUS | Status: AC
  Filled 2023-03-25: qty 20

## 2023-03-25 MED ORDER — PROPOFOL 10 MG/ML IV BOLUS
INTRAVENOUS | Status: AC
  Filled 2023-03-25: qty 20

## 2023-03-25 MED ORDER — DEXMEDETOMIDINE HCL IN NACL 80 MCG/20ML IV SOLN
INTRAVENOUS | Status: DC | PRN
Start: 2023-03-25 — End: 2023-03-25
  Administered 2023-03-25: 8 ug via INTRAVENOUS

## 2023-03-25 MED ORDER — CHLORHEXIDINE GLUCONATE 0.12 % MT SOLN
OROMUCOSAL | Status: AC
  Filled 2023-03-25: qty 15

## 2023-03-25 MED ORDER — KETOROLAC TROMETHAMINE 30 MG/ML IJ SOLN
INTRAMUSCULAR | Status: AC
  Filled 2023-03-25: qty 1

## 2023-03-25 MED ORDER — CEFAZOLIN SODIUM-DEXTROSE 2-4 GM/100ML-% IV SOLN
INTRAVENOUS | Status: AC
  Filled 2023-03-25: qty 100

## 2023-03-25 MED ORDER — SUGAMMADEX SODIUM 200 MG/2ML IV SOLN
INTRAVENOUS | Status: DC | PRN
  Administered 2023-03-25: 200 mg via INTRAVENOUS

## 2023-03-25 MED ORDER — KETOROLAC TROMETHAMINE 30 MG/ML IJ SOLN
INTRAMUSCULAR | Status: DC | PRN
  Administered 2023-03-25: 15 mg via INTRAVENOUS

## 2023-03-25 MED ORDER — ONDANSETRON HCL 4 MG/2ML IJ SOLN
INTRAMUSCULAR | Status: AC
  Filled 2023-03-25: qty 2

## 2023-03-25 MED ORDER — ONDANSETRON HCL 4 MG/2ML IJ SOLN
INTRAMUSCULAR | Status: DC | PRN
  Administered 2023-03-25: 4 mg via INTRAVENOUS

## 2023-03-25 MED ORDER — DIPHENHYDRAMINE HCL 50 MG/ML IJ SOLN
INTRAMUSCULAR | Status: DC | PRN
  Administered 2023-03-25: 12.5 mg via INTRAVENOUS

## 2023-03-25 MED ORDER — DEXAMETHASONE SODIUM PHOSPHATE 10 MG/ML IJ SOLN
INTRAMUSCULAR | Status: AC
  Filled 2023-03-25: qty 1

## 2023-03-25 SURGICAL SUPPLY — 25 items
ADHESIVE MASTISOL STRL (MISCELLANEOUS) IMPLANT
BAG DRAIN SIEMENS DORNER NS (MISCELLANEOUS) ×2 IMPLANT
BAG PRESSURE INF REUSE 3000 (BAG) ×2 IMPLANT
BRUSH SCRUB EZ 1% IODOPHOR (MISCELLANEOUS) ×2 IMPLANT
CATH URET FLEX-TIP 2 LUMEN 10F (CATHETERS) IMPLANT
CATH URETL OPEN 5X70 (CATHETERS) IMPLANT
CNTNR URN SCR LID CUP LEK RST (MISCELLANEOUS) IMPLANT
DRAPE UTILITY 15X26 TOWEL STRL (DRAPES) ×2 IMPLANT
DRSG TEGADERM 2-3/8X2-3/4 SM (GAUZE/BANDAGES/DRESSINGS) IMPLANT
FIBER LASER MOSES 365 DFL (Laser) IMPLANT
GLOVE BIOGEL PI IND STRL 7.5 (GLOVE) ×2 IMPLANT
GOWN STRL REUS W/ TWL LRG LVL3 (GOWN DISPOSABLE) ×2 IMPLANT
GOWN STRL REUS W/ TWL XL LVL3 (GOWN DISPOSABLE) ×2 IMPLANT
GUIDEWIRE STR DUAL SENSOR (WIRE) ×2 IMPLANT
IV NS IRRIG 3000ML ARTHROMATIC (IV SOLUTION) ×2 IMPLANT
KIT TURNOVER CYSTO (KITS) ×2 IMPLANT
PACK CYSTO AR (MISCELLANEOUS) ×2 IMPLANT
SET CYSTO W/LG BORE CLAMP LF (SET/KITS/TRAYS/PACK) ×2 IMPLANT
SHEATH NAVIGATOR HD 12/14X36 (SHEATH) IMPLANT
STENT URET 6FRX24 CONTOUR (STENTS) IMPLANT
STENT URET 6FRX26 CONTOUR (STENTS) IMPLANT
SURGILUBE 2OZ TUBE FLIPTOP (MISCELLANEOUS) ×2 IMPLANT
SYR 10ML LL (SYRINGE) ×2 IMPLANT
VALVE UROSEAL ADJ ENDO (VALVE) IMPLANT
WATER STERILE IRR 500ML POUR (IV SOLUTION) ×2 IMPLANT

## 2023-03-25 NOTE — Anesthesia Postprocedure Evaluation (Signed)
 Anesthesia Post Note  Patient: Kayla Sexton  Procedure(s) Performed: CYSTOSCOPY/URETEROSCOPY/HOLMIUM LASER (Left: Ureter)  Patient location during evaluation: PACU Anesthesia Type: General Level of consciousness: awake and awake and alert Pain management: satisfactory to patient Vital Signs Assessment: post-procedure vital signs reviewed and stable Respiratory status: spontaneous breathing Cardiovascular status: blood pressure returned to baseline Anesthetic complications: no   No notable events documented.   Last Vitals:  Vitals:   03/25/23 1126 03/25/23 1146  BP: 134/83 (!) 149/103  Pulse: 73 90  Resp: 14 16  Temp:  (!) 36.3 C  SpO2: 95% 100%    Last Pain:  Vitals:   03/25/23 1146  TempSrc: Temporal  PainSc:                  VAN STAVEREN,Alf Doyle

## 2023-03-25 NOTE — Anesthesia Preprocedure Evaluation (Signed)
 Anesthesia Evaluation  Patient identified by MRN, date of birth, ID band Patient awake    Reviewed: Allergy & Precautions, NPO status , Patient's Chart, lab work & pertinent test results  History of Anesthesia Complications (+) PONV and history of anesthetic complications  Airway Mallampati: III  TM Distance: >3 FB Neck ROM: full    Dental  (+) Teeth Intact   Pulmonary neg pulmonary ROS, asthma , sleep apnea    Pulmonary exam normal breath sounds clear to auscultation       Cardiovascular Exercise Tolerance: Good negative cardio ROS Normal cardiovascular exam Rhythm:Regular Rate:Normal     Neuro/Psych  Headaches  Anxiety     negative neurological ROS  negative psych ROS   GI/Hepatic negative GI ROS, Neg liver ROS, hiatal hernia,GERD  Medicated,,  Endo/Other  negative endocrine ROS  Class 3 obesity  Renal/GU   negative genitourinary   Musculoskeletal negative musculoskeletal ROS (+)    Abdominal  (+) + obese  Peds negative pediatric ROS (+)  Hematology negative hematology ROS (+) Blood dyscrasia, anemia   Anesthesia Other Findings Past Medical History: No date: Anemia No date: Asthma 2006: Cancer (HCC)     Comment:  CERVICAL  No date: CIN III (cervical intraepithelial neoplasia III) No date: Dysuria No date: Family history of adverse reaction to anesthesia     Comment:  DAD AND BROTHER AND MOM-N/V No date: GERD (gastroesophageal reflux disease)     Comment:  OCC No date: Hematuria No date: HH (hiatus hernia) 2006: History of cervical dysplasia     Comment:  CIN1 and CIN2 No date: History of kidney stones No date: Migraines No date: Nephrolithiasis, uric acid No date: PCOS (polycystic ovarian syndrome)     Comment:  Dr. Vincente Poli No date: PONV (postoperative nausea and vomiting)     Comment:  HARD TO WAKE UP No date: Right ureteral calculus No date: Sleep apnea     Comment:  DOES NOT USE CPAP No date:  Urinary incontinence without sensory awareness  Past Surgical History: No date: ABDOMINAL HYSTERECTOMY     Comment:  total / laporscopic 12-04-2004   dr Vincente Poli  [facility]: CERVICAL BIOPSY  W/ LOOP ELECTRODE  EXCISION 03/2008: COLONOSCOPY     Comment:  Dr. Bosie Clos 02-28-2012   dr Vincente Poli   @ [facility]: COMBINED HYSTEROSCOPY DIAGNOSTIC / D&C 03/31/2018: CYSTOSCOPY W/ RETROGRADES; Right     Comment:  Procedure: CYSTOSCOPY WITH RETROGRADE PYELOGRAM;                Surgeon: Sondra Come, MD;  Location: [facility];                Service: Urology;  Laterality: Right; 11/11/2020: CYSTOSCOPY W/ RETROGRADES     Comment:  Procedure: CYSTOSCOPY WITH RETROGRADE PYELOGRAM;                Surgeon: Sondra Come, MD;  Location: [facility];                Service: Urology;; 07/21/2017: CYSTOSCOPY WITH RETROGRADE PYELOGRAM, URETEROSCOPY AND  STENT PLACEMENT; Bilateral     Comment:  Procedure: CYSTOSCOPY WITH BILATERAL  RETROGRADE               BILATERAL URETEROSCOPY AND STENTS  PLACEMENT;  Surgeon:               Bjorn Pippin, MD;  Location: [facility];  Service: Urology;  Laterality: Bilateral; 01/04/2020: CYSTOSCOPY WITH RETROGRADE URETHROGRAM; Left     Comment:  Procedure: CYSTOSCOPY WITH RETROGRADE URETHROGRAM;                Surgeon: Sondra Come, MD;  Location: [facility];                Service: Urology;  Laterality: Left; 03/17/2018: CYSTOSCOPY/URETEROSCOPY/HOLMIUM LASER/STENT PLACEMENT;  Left     Comment:  Procedure: CYSTOSCOPY/URETEROSCOPY/HOLMIUM LASER/STENT               PLACEMENT;  Surgeon: Sondra Come, MD;  Location:               [facility];  Service: Urology;  Laterality: Left; 03/31/2018: CYSTOSCOPY/URETEROSCOPY/HOLMIUM LASER/STENT PLACEMENT;  Right     Comment:  Procedure: CYSTOSCOPY/URETEROSCOPY/HOLMIUM LASER/STENT               PLACEMENT;  Surgeon: Sondra Come, MD;  Location:               [facility];  Service: Urology;  Laterality:  Right; 01/04/2020: CYSTOSCOPY/URETEROSCOPY/HOLMIUM LASER/STENT PLACEMENT;  Left     Comment:  Procedure: CYSTOSCOPY/URETEROSCOPY/HOLMIUM LASER;                Surgeon: Sondra Come, MD;  Location: [facility];                Service: Urology;  Laterality: Left; 11/11/2020: CYSTOSCOPY/URETEROSCOPY/HOLMIUM LASER/STENT PLACEMENT;  Left     Comment:  Procedure: CYSTOSCOPY/URETEROSCOPY/HOLMIUM LASER;                Surgeon: Sondra Come, MD;  Location: [facility];                Service: Urology;  Laterality: Left; 07/02/2022: CYSTOSCOPY/URETEROSCOPY/HOLMIUM LASER/STENT PLACEMENT;  Left     Comment:  Procedure: CYSTOSCOPY/URETEROSCOPY/HOLMIUM LASER FOR               STONE;  Surgeon: Sondra Come, MD;  Location: [facility]               [facility];  Service: Urology;  Laterality: Left; No date: DILATION AND CURETTAGE OF UTERUS 2010: endoscopy     Comment:  Dr. Madilyn Fireman 08/2021: EXTRACORPOREAL SHOCK WAVE LITHOTRIPSY No date: FINGER SURGERY; Right     Comment:  MIDDLE FINGER 2015: FOOT SURGERY; Left     Comment:  5th bunionectomy w/ 2nd digit w/ fusion , retained               hardware w/ 5th toe No date: FRACTURE SURGERY     Comment:  left shoulder 07/21/2017: HOLMIUM LASER APPLICATION; Bilateral     Comment:  Procedure: HOLMIUM LASER APPLICATION;  Surgeon: Bjorn Pippin, MD;  Location: [facility];                Service: Urology;  Laterality: Bilateral;     Reproductive/Obstetrics negative OB ROS                             Anesthesia Physical Anesthesia Plan  ASA: 3  Anesthesia Plan: General   Post-op Pain Management:    Induction: Intravenous  PONV Risk Score and Plan: Ondansetron, Dexamethasone, Midazolam and Treatment may vary due to age or medical condition  Airway Management Planned: Oral ETT  Additional Equipment:   Intra-op Plan:  Post-operative Plan: Extubation in OR  Informed Consent: I have reviewed the  patients History and Physical, chart, labs and discussed the procedure including the risks, benefits and alternatives for the proposed anesthesia with the patient or authorized representative who has indicated his/her understanding and acceptance.     Dental Advisory Given  Plan Discussed with: CRNA  Anesthesia Plan Comments:        Anesthesia Quick Evaluation

## 2023-03-25 NOTE — Interval H&P Note (Signed)
 UROLOGY H&P UPDATE  Agree with prior H&P dated 2/24 by Carman Ching, PA.  8 mm left renal pelvis stone likely ball valving and causing intermittent renal colic.  Urine culture negative.                    Cardiac: RRR Lungs: CTA bilaterally  Laterality: Left Procedure: Left ureteroscopy, laser lithotripsy, stent placement  Urine: Culture no growth  We specifically discussed the risks ureteroscopy including bleeding, infection/sepsis, stent related symptoms including flank pain/urgency/frequency/incontinence/dysuria, ureteral injury, ureteral stricture, inability to access stone, or need for staged or additional procedures.  We also discussed need for 24-hour urine metabolic workup, she has not tolerated potassium citrate and discontinued that medication.  Sondra Come, MD 03/25/2023

## 2023-03-25 NOTE — Transfer of Care (Signed)
 Immediate Anesthesia Transfer of Care Note  Patient: Kayla Sexton  Procedure(s) Performed: CYSTOSCOPY/URETEROSCOPY/HOLMIUM LASER (Left: Ureter)  Patient Location: PACU  Anesthesia Type:General  Level of Consciousness: awake, alert , oriented, and patient cooperative  Airway & Oxygen Therapy: Patient Spontanous Breathing and Patient connected to face mask oxygen  Post-op Assessment: Report given to RN, Post -op Vital signs reviewed and stable, and Patient moving all extremities  Post vital signs: Reviewed and stable  Last Vitals:  Vitals Value Taken Time  BP 143/100 03/25/23 0953  Temp 36.2 C 03/25/23 0953  Pulse 93 03/25/23 0959  Resp 16 03/25/23 0959  SpO2 100 % 03/25/23 0959  Vitals shown include unfiled device data.  Last Pain:  Vitals:   03/25/23 0953  TempSrc:   PainSc: Asleep         Complications: No notable events documented.

## 2023-03-25 NOTE — Progress Notes (Signed)
   03/25/23 0730  Spiritual Encounters  Type of Visit Initial  Care provided to: Patient;Friend  Referral source Chaplain assessment  Reason for visit Routine spiritual support  OnCall Visit No  Interventions  Spiritual Care Interventions Made Established relationship of care and support;Compassionate presence;Reflective listening  Intervention Outcomes  Outcomes Reduced anxiety;Connection to spiritual care;Awareness around self/spiritual resourses  Spiritual Care Plan  Spiritual Care Issues Still Outstanding No further spiritual care needs at this time (see row info)

## 2023-03-25 NOTE — Anesthesia Procedure Notes (Signed)
 Procedure Name: Intubation Date/Time: 03/25/2023 9:21 AM  Performed by: Rich Brave, CRNAPre-anesthesia Checklist: Patient identified, Emergency Drugs available, Suction available, Patient being monitored and Timeout performed Patient Re-evaluated:Patient Re-evaluated prior to induction Oxygen Delivery Method: Circle system utilized Induction Type: IV induction Ventilation: Mask ventilation without difficulty and Two handed mask ventilation required Laryngoscope Size: McGrath and 3 Grade View: Grade II Tube type: Oral Tube size: 6.5 mm Number of attempts: 1 Airway Equipment and Method: Stylet and Video-laryngoscopy Placement Confirmation: ETT inserted through vocal cords under direct vision, positive ETCO2 and breath sounds checked- equal and bilateral Secured at: 20 cm Tube secured with: Tape Dental Injury: Teeth and Oropharynx as per pre-operative assessment

## 2023-03-25 NOTE — Op Note (Signed)
 Date of procedure: 03/25/23  Preoperative diagnosis:  Left renal stones  Postoperative diagnosis:  Same  Procedure: Cystoscopy, left ureteroscopy and laser lithotripsy, left retrograde pyelogram with intraoperative interpretation  Surgeon: Legrand Rams, MD  Anesthesia: General  Complications: None  Intraoperative findings:  Normal bladder Uncomplicated dusting of all left renal pelvis stones No evidence of UPJ obstruction Excellent efflux from left ureter at conclusion of case and drainage, no stent placed(she has not tolerated stents in the past)  EBL: Minimal  Specimens: None  Drains: None  Indication: Kayla Sexton is a 43 y.o. patient with recurrent uric acid stones on the left side, she has not tolerated potassium citrate.  Here today with a left renal pelvis stone causing intermittent renal colic as well as a lower pole stone and opted for ureteroscopy.  She has not tolerated ureteral stents in the past.  After reviewing the management options for treatment, they elected to proceed with the above surgical procedure(s). We have discussed the potential benefits and risks of the procedure, side effects of the proposed treatment, the likelihood of the patient achieving the goals of the procedure, and any potential problems that might occur during the procedure or recuperation. Informed consent has been obtained.  Description of procedure:  The patient was taken to the operating room and general anesthesia was induced. SCDs were placed for DVT prophylaxis. The patient was placed in the dorsal lithotomy position, prepped and draped in the usual sterile fashion, and preoperative antibiotics(Ancef) were administered. A preoperative time-out was performed.   A 21 French rigid cystoscope was used to intubate the urethra and thorough cystoscopy was performed.  The bladder was grossly normal throughout.  A sensor wire was advanced up to the left kidney under fluoroscopic vision.  A  digital single-channel flexible ureteroscope was then advanced over the wire up to the kidney under fluoroscopic vision.  Thorough pyeloscopy revealed an 8 mm stone in the renal pelvis and a 7 mm stone in the lower pole.  A 365 m laser fiber on settings of 0.5 J and 80 Hz was used to methodically fragment all stones to dust.  All fragments were significantly smaller than the laser fiber.  No additional stones were identified.  A retrograde pyelogram was performed from the proximal ureter and showed no extravasation or filling defects.  Careful pullback ureteroscopy showed no ureteral injury or residual stones in the ureter.  The rigid cystoscope was reinserted and there was excellent efflux from the left ureter, and contrast drained from the left kidney.  A stent was not placed as she has not tolerated stents well previously.  Disposition: Stable to PACU  Plan: Follow-up in clinic in 6 weeks with 24-hour urine metabolic workup prior  Legrand Rams, MD

## 2023-03-28 ENCOUNTER — Telehealth: Payer: Self-pay | Admitting: Urology

## 2023-03-28 ENCOUNTER — Encounter: Payer: Self-pay | Admitting: Urology

## 2023-03-28 NOTE — Telephone Encounter (Signed)
 Patient called to check the status of FMLA paperwork that she dropped off last week. Please advise patient.

## 2023-03-29 NOTE — Telephone Encounter (Signed)
 Spoke with patient. Forms will be completed and sent to employer by end of the week. We have til 3/24 to get them submitted.

## 2023-03-31 NOTE — Telephone Encounter (Signed)
FMLA paperwork faxed today

## 2023-04-13 ENCOUNTER — Encounter: Payer: Self-pay | Admitting: Urology

## 2023-04-22 ENCOUNTER — Ambulatory Visit: Payer: Commercial Managed Care - PPO | Admitting: Physician Assistant

## 2023-04-26 ENCOUNTER — Ambulatory Visit: Admitting: Physician Assistant

## 2023-05-02 ENCOUNTER — Ambulatory Visit: Payer: Commercial Managed Care - PPO | Admitting: Urology

## 2023-08-11 IMAGING — CT CT RENAL STONE PROTOCOL
2 of 4 series · 16 of 46 positions shown, 18 images · non-contrast
Comparison: 06/19/2020

CLINICAL DATA: Left-sided kidney stone, ongoing pain, worsening for
3 weeks

EXAM:
CT ABDOMEN AND PELVIS WITHOUT CONTRAST
TECHNIQUE: Multidetector CT imaging of the abdomen and pelvis was performed
following the standard protocol without IV contrast.

[Series 2: stone full standard · axial · 0.75mm/px · z∈[-490,-54]mm · 13 of 95 slices shown, 15 images]
[im 4/95  soft-tissue]
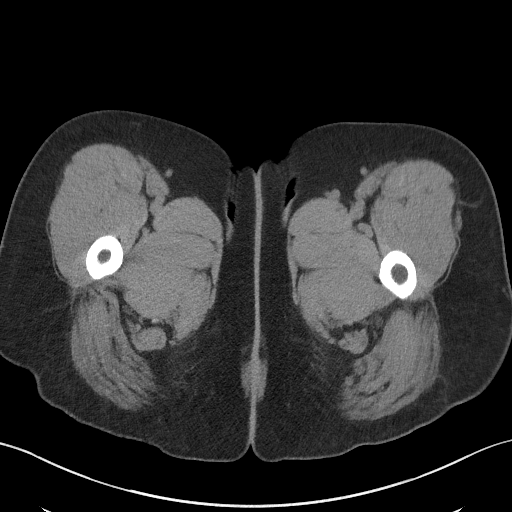
[im 4/95  bone]
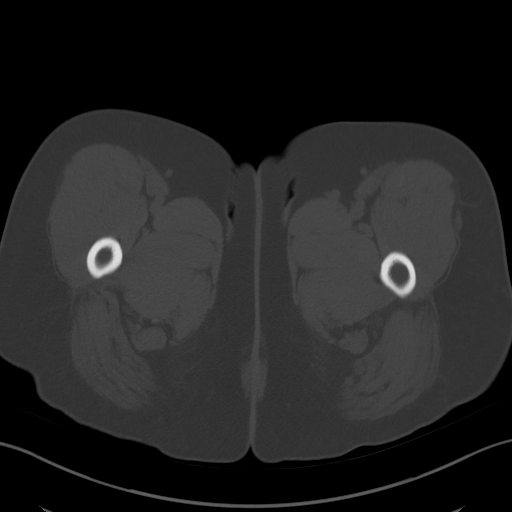
[im 12/95  soft-tissue]
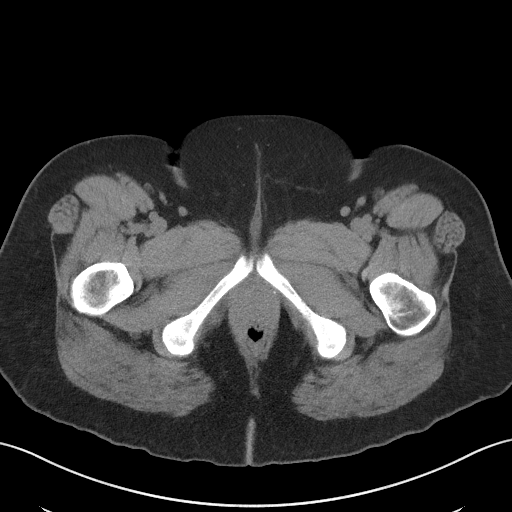
[im 19/95  soft-tissue]
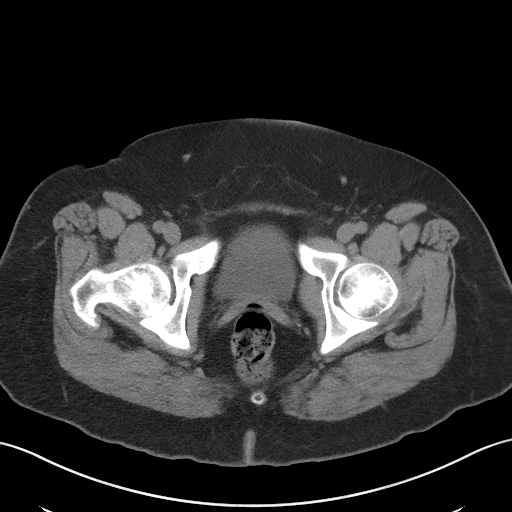
[im 27/95  soft-tissue]
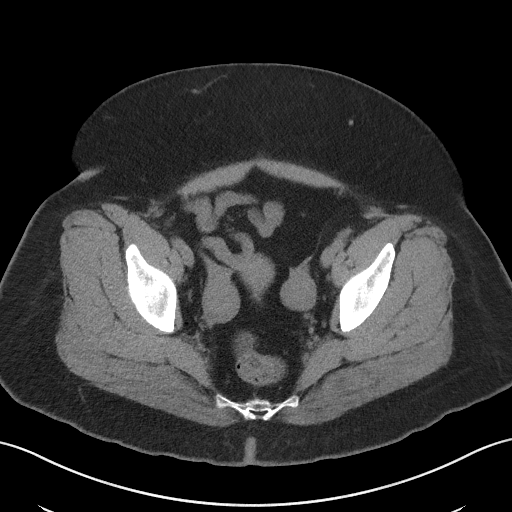
[im 34/95  soft-tissue]
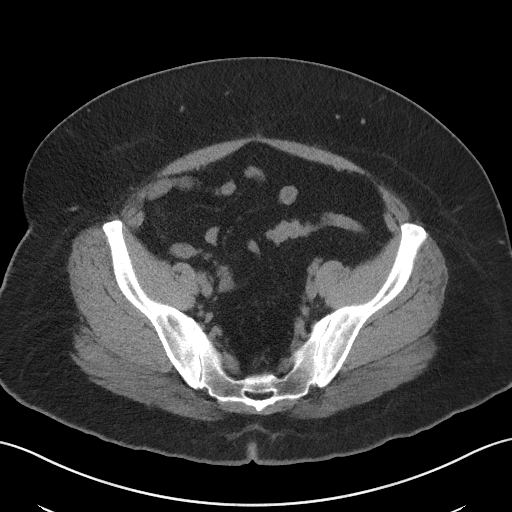
[im 42/95  soft-tissue]
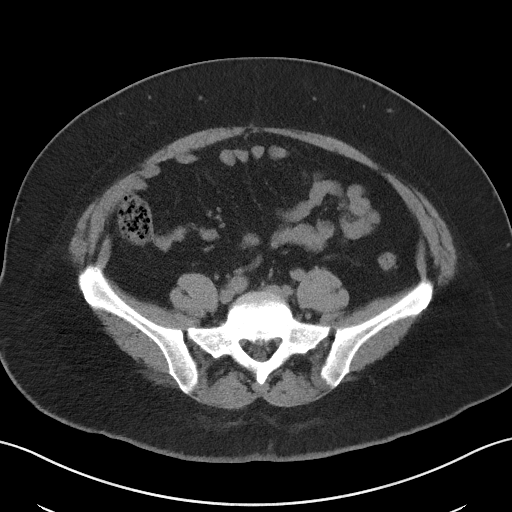
[im 49/95  soft-tissue]
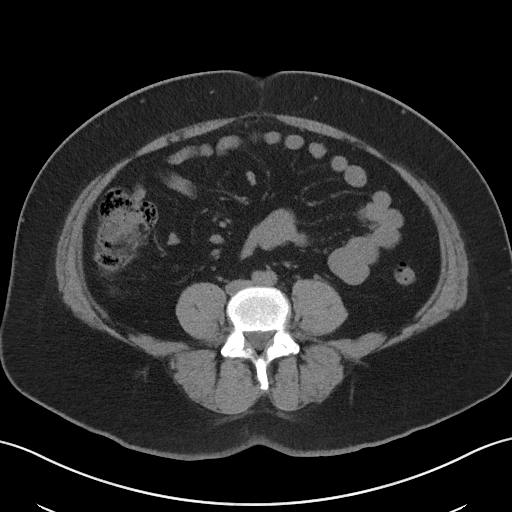
[im 53/95  soft-tissue]
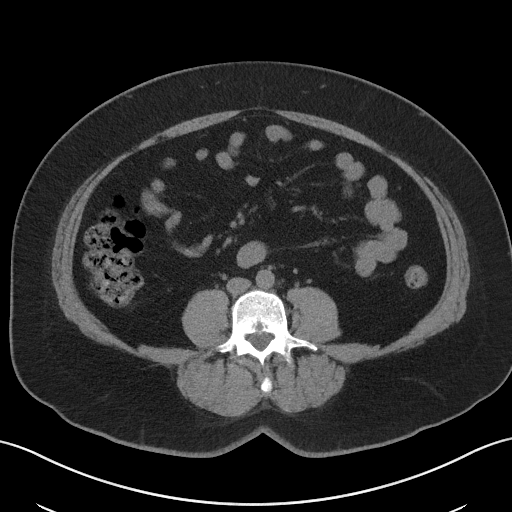
[im 61/95  soft-tissue]
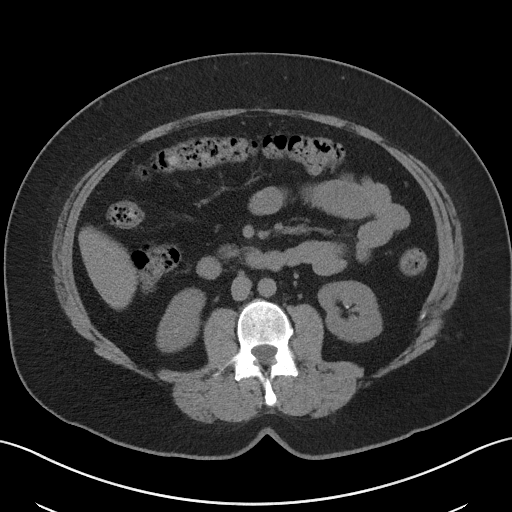
[im 61/95  bone]
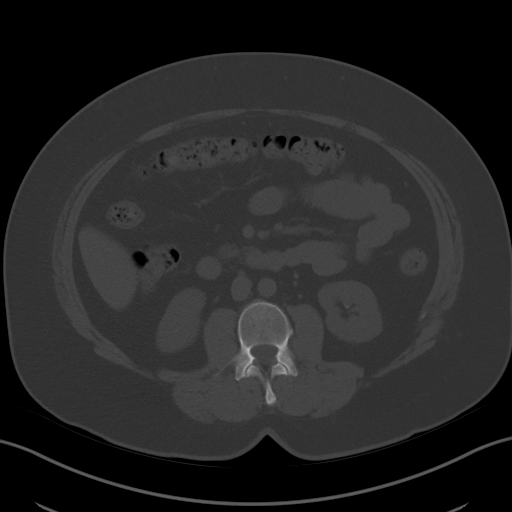
[im 68/95  soft-tissue]
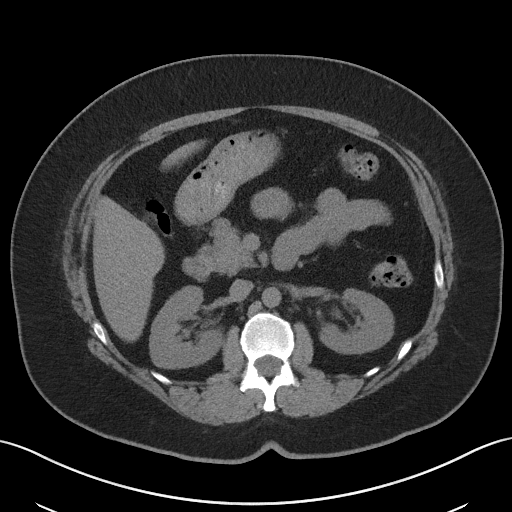
[im 76/95  soft-tissue]
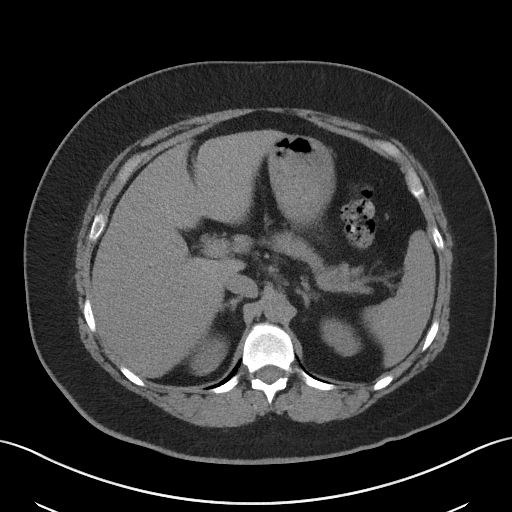
[im 83/95  soft-tissue]
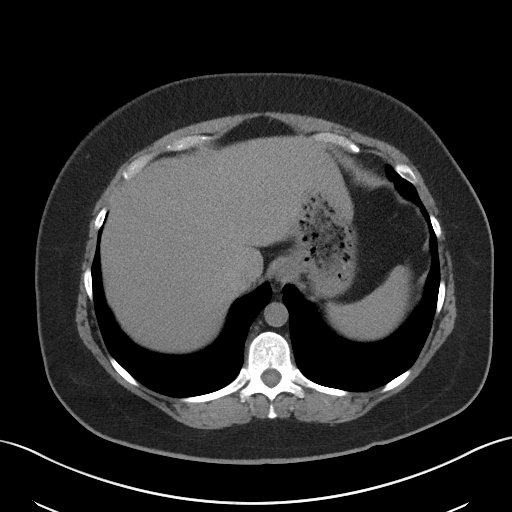
[im 91/95  soft-tissue]
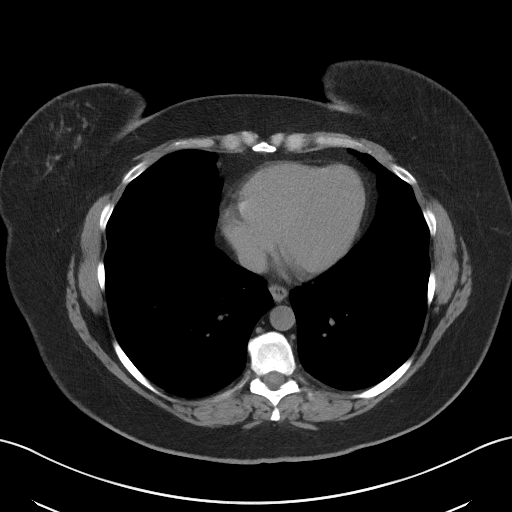

[Series 5: coronal · coronal · 0.85mm/px · 3 of 161 slices shown]
[im 54/161  soft-tissue]
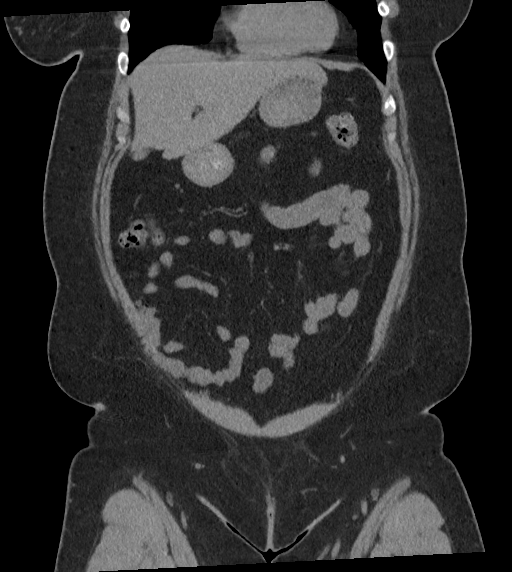
[im 72/161  soft-tissue]
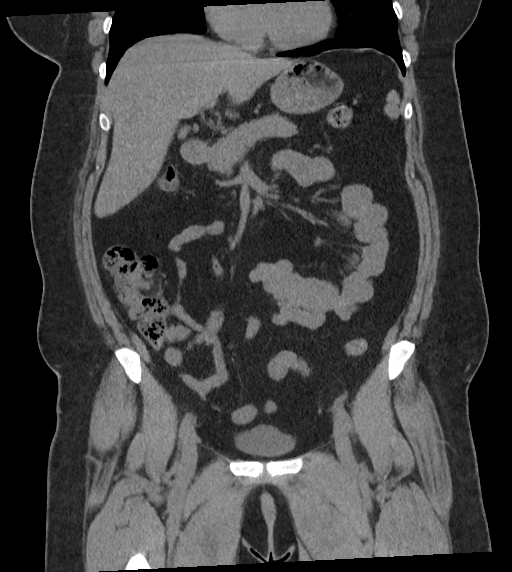
[im 89/161  soft-tissue]
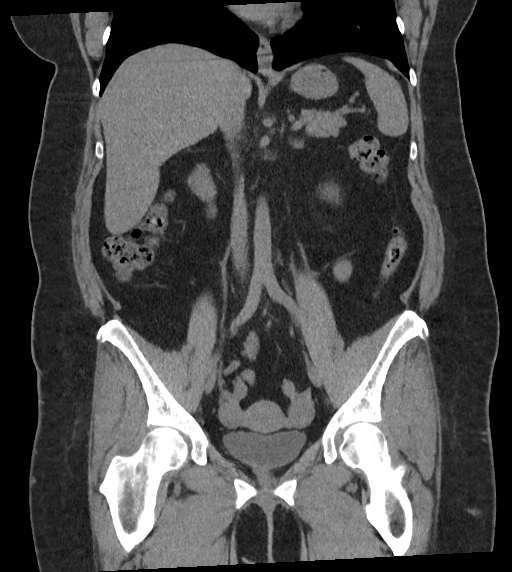

[16 of 46 positions shown; findings below may reference images not displayed]

FINDINGS: Lower chest: No acute abnormality.

Hepatobiliary: No solid liver abnormality is seen. No gallstones,
gallbladder wall thickening, or biliary dilatation.

Pancreas: Unremarkable. No pancreatic ductal dilatation or
surrounding inflammatory changes.

Spleen: Normal in size without significant abnormality.

Adrenals/Urinary Tract: Adrenal glands are unremarkable. Multiple
small calculi in the left inferior pole calices and left renal
pelvis, measuring up to 0.8 cm in the renal pelvis (series 5, image
101). No ureteral calculi or hydronephrosis. Small phlebolith
adjacent to, but not within the most distal left ureter (series 2,
image 76). Bladder is unremarkable.

Stomach/Bowel: Stomach is within normal limits. Appendix appears
normal. No evidence of bowel wall thickening, distention, or
inflammatory changes.

Vascular/Lymphatic: No significant vascular findings are present. No
enlarged abdominal or pelvic lymph nodes.

Reproductive: No mass or other significant abnormality.

Other: No abdominal wall hernia or abnormality. No abdominopelvic
ascites.

Musculoskeletal: No acute or significant osseous findings.
IMPRESSION: Multiple small calculi in the left inferior pole calices and left
renal pelvis, measuring up to 0.8 cm in the renal pelvis. No
right-sided calculi, ureteral calculi or hydronephrosis.

These results will be called to the ordering clinician or
representative by the Radiologist Assistant, and communication
documented in the PACS or [REDACTED].

## 2023-08-25 NOTE — Progress Notes (Signed)
 Otolaryngology New Patient Note  Subjective: [name] is a 43 y.o. female who presents with acute tonsillitis.  She was evaluated by Dr. Burnie in ENT 2 days ago and prescribed doxycycline  and a 5-day course of prednisone .  There was no evidence of peritonsillar abscess at that time.  She returns today with reports of worsening throat pain and trismus on the left.  She is tolerating secretions but reports decreased p.o. intake.  No history of recurrent tonsillitis or previous PTA.  She denies difficulty breathing or increased work of breathing.  Medical History[1] Surgical History[2] Family History[3]   Allergies[4]   ROS A complete review of systems was conducted and was negative except as stated in the HPI.   Objective: Vitals:   08/25/23 1456  BP: 130/78  Pulse: (!) 112  Temp: 99.9 F (37.7 C)   Physical Exam:   General Normocephalic, Awake, Alert and appropriate for the exam  Eyes PERRL, no scleral icterus or conjunctival hemorrhage.  EOMI.  Ears Right ear- Normal auricles and EACs. TM: intact, no effusion, no retraction, normal landmarks.  Left ear- Normal auricles and EACs. TM: intact, no effusion, no retraction, normal landmarks.  Nose No external nasal deformities. Septum straight. Nasal mucosa moist. No significant turbinate hypertrophy. No masses or polyps.  Oral Cavity/Pharynx Tonsils: 1-2+, symmetric.  Mild erythema diffusely.  Region of mucosal sloughing involving the left posterior tonsillar pillar.  No asymmetric fullness of the lateral pharyngeal walls or soft palate.  Uvula midline.  Palate elevates symmetrically. Soft FOM. Mobile tongue.  Neck/Lymphatics Soft and supple without LAD. Thyroid  normal to palpation without dominant nodule or irregularity. Palpation of the salivary glands demonstrates no masses or irregularity.  Cardio-vascular No cyanosis, regular rate  Pulmonary No audible stridor, Breathing easily with no labor. No dysphonia.   Neuro  Symmetric facial movement.   Tongue protrudes in midline.  Psychiatry Appropriate affect and mood for clinic visit.  Skin No scars or lesions on face or neck.       Assessment:  [name] presents with acute tonsillitis.  Exam is remarkable for mild erythema with no exudate. No evidence of peritonsillar abscess.  There is a region of superficial mucosal sloughing involving the left posterior tonsillar pillar.  This is likely contributing to the patient's pain.  Recommend the patient continue her full course of doxycycline  and prednisone  taper.  Magic mouthwash had been previously ordered and will likely provide added comfort. Encouraged adequate hydration and p.o. intake.  Plan:  As above.  Return to clinic as needed for worsening symptoms, or with questions or concerns.  Oliva MICAEL Rily, MD Otolaryngology - Head & Neck Surgery ENT & Audiology - [facility]   Electronically signed by: Oliva Elsie Rily, MD 08/25/2023 3:20 PM       [1] History reviewed. No pertinent past medical history. [2] History reviewed. No pertinent surgical history. [3] No family history on file. [4] Allergies Allergen Reactions  . Adhesive Tape-Silicones Rash  . Vancomycin  Rash

## 2023-08-29 NOTE — Telephone Encounter (Signed)
 okay

## 2023-08-31 NOTE — Telephone Encounter (Signed)
 She may have a viral illness.  I would recommend rest and fluids and if her condition worsens to see her PCP.  I could fill out the paperwork tomorrow.  Could you get it started wendy and write some dates on it for me?

## 2023-09-03 NOTE — ED Provider Notes (Addendum)
 Chief Complaint  Patient presents with  . Rash  . Sore Throat  . Difficulty Swallowing       HPI HPI   Patient is a 43 year old female who presents today with multiple concerns that been ongoing for the last 2 weeks.  Patient says that she was recently treated for a tonsillitis as well as ulceration of the tonsil and completed antibiotic therapy for that, she has had difficulty and painful swallowing since that time, she is not having left-sided jaw and ear pain, she is having nausea vomiting and unable to tolerate any food or oral liquids.  No abdominal pain.  No fevers that she is aware of.  Patient says she has had a 20 pound weight loss over the last couple weeks due to being unable to tolerate p.o.  The last few days patient has also started to have some random small blistering over her torso, right and left side, these areas will then pop with clear fluid.  Patient has been seen in the emergency department as well as 2 different ENT appointments.  She completed antibiotics a week ago.  She does say that her left-sided throat pain does feel like it starting to get some better.  Patient History Medical History[1] Surgical History[2] Family History[3] Social History[4]    Review of Systems Review of Systems  See HPI  Physical Exam ED Triage Vitals [09/03/23 1322]  Temp 98.1 F (36.7 C)  Heart Rate (!) 118  Resp 18  BP 106/80  MAP (mmHg) 90  SpO2 100 %  O2 Device   O2 Flow Rate (L/min)   Weight 81.6 kg (180 lb)   Physical Exam Vitals and nursing note reviewed.  Constitutional:      Appearance: She is well-developed. She is obese.  HENT:     Head:     Jaw: Tenderness present. No swelling.     Salivary Glands: Left salivary gland is not diffusely enlarged or tender.      Comments: No swelling of the soft palate under the tongue.    Left Ear: Tympanic membrane, ear canal and external ear normal. Tenderness present.     Mouth/Throat:     Mouth: Mucous membranes  are moist.   Cardiovascular:     Rate and Rhythm: Normal rate.  Pulmonary:     Effort: Pulmonary effort is normal.  Abdominal:     Palpations: Abdomen is soft.     Tenderness: There is no abdominal tenderness. There is no guarding.   Skin:    General: Skin is warm.   Neurological:     General: No focal deficit present.     Mental Status: She is alert.   Psychiatric:        Mood and Affect: Mood normal.        CHA2DS2-VASc Score: N/A  Glasgow Coma Scale Score: 15                 Procedures                       ED Course & MDM   Medical Decision Making Problems Addressed: Acute cystitis with hematuria: complicated acute illness or injury Nausea and vomiting, unspecified vomiting type: complicated acute illness or injury  Amount and/or Complexity of Data Reviewed Labs: ordered. Radiology: ordered.  Risk Prescription drug management.   Clinical Complexity Patient's presentation is most consistent with acute presentation with potential threat to life or bodily function.  Patient does have comorbidities/underlying diseases, namely recent tonsillitis with ulceration, which makes the patient's presentation today of left-sided neck and jaw pain, nausea vomiting higher risk, increases risk of complications and morbidity/mortality of patient management, and ultimately increases complexity of their visit today.  Medical records reviewed independently by me, this is notable for previous ED and ENT visits, labs, imaging.  DDx: Peritonsillar abscess, oral abscess, gastroenteritis, antibiotic reaction, shingles, contact dermatitis, among others  On initial valuation patient is complaining of nausea but no abdominal pain.  Will start IV fluids, give a dose of Compazine  and Benadryl  as the patient says her home Zofran  has not been working.  Will obtain labs and imaging.UA shows many bacteria and positive nitrates and small leukocytes, indicating urinary tract infection.  WBC  normal at 10, hemoglobin 15, potassium is low at 3, other electrolytes within normal limits, creatinine 1.03.  Due to on going nausea and vomiting CT abdomen pelvis was obtained there were no acute findings in the abdomen or pelvis.  Due to patient's ongoing left-sided neck and jaw pain CT soft tissue neck was obtained.  There were no abnormal enhancements in the neck, but there is a enlarged and multinodular thyroid  gland with the largest nodule measuring 9 x 14 mm.  There is mild mass effect on the trachea but it is grossly patent.  There are also some prominent cervical lymph nodes but not overly suspicious.  Discussed these findings with patient.  I discussed that we will start her on antibiotics for her UTI.  Discussed the abnormal findings in the thyroid  as well and that this needs to be followed up by her PCP with outpatient ultrasound.  Patient stated understanding.  TSH was checked which was actually low.  Unsure at this time what is causing the patient's random fluid-filled blisters to occur.  They are on both sides of the body, and multiple different areas on the trunk, low suspicion for shingles.  Do not appear to be cellulitic as it would be a single area and they are random.  Do not appear to be infected as well.  No draining that I can see.  Some areas of scabbed over.  Due to ongoing nausea at home and patient stating ineffectiveness of Zofran  will trial Compazine  at home to see if this helps.  Patient was able to tolerate some water while she was here in the emergency department.  Wanted to orally replace patient's potassium but she insisted that she had to go and that she had potassium supplements at home, encouraged her to take potassium supplements for the next 2 to 3 days and have labs rechecked.  Patient stated understanding.  Patient was in a rush to go to the emergency department, she denied further questions or needs.  Feel the patient is safe for discharge home.  Nodule in her trachea not  causing any airway compromise or respiratory distress and she has remained hemodynamically stable throughout her stay.  Return precautions discussed.  Patient discharged from the emergency department stable condition.  Multiple problems were addressed during today's visit as outlined above.     ED Disposition:  Discharge Final diagnoses:  Acute cystitis with hematuria  Nausea and vomiting, unspecified vomiting type    ED Prescriptions     Medication Sig Dispense Start Date End Date Auth. Provider   cephALEXin  (KEFLEX ) 500 mg capsule Take 1 capsule (500 mg total) by mouth 2 (two) times a day for 7 days. 14 capsule 09/03/2023 09/10/2023 [employer],  NP   prochlorperazine  (COMPAZINE ) 10 mg tablet Take 1 tablet (10 mg total) by mouth 2 (two) times a day as needed for nausea or vomiting. 10 tablet 09/03/2023 09/08/2023 Duwaine Boyce Stark, NP             [1] No past medical history on file. [2] No past surgical history on file. [3] No family history on file. [4] Social History Tobacco Use  . Smoking status: Never  . Smokeless tobacco: Never  Substance Use Topics  . Alcohol use: Not Currently  . Drug use: Not Currently

## 2023-09-05 ENCOUNTER — Other Ambulatory Visit: Payer: Self-pay

## 2023-09-05 DIAGNOSIS — E042 Nontoxic multinodular goiter: Secondary | ICD-10-CM

## 2023-09-06 ENCOUNTER — Ambulatory Visit: Payer: Self-pay | Admitting: *Deleted

## 2023-09-06 ENCOUNTER — Other Ambulatory Visit: Payer: Self-pay

## 2023-09-06 ENCOUNTER — Observation Stay (HOSPITAL_COMMUNITY)
Admission: EM | Admit: 2023-09-06 | Discharge: 2023-09-07 | Disposition: A | Discharge status: Home / Self Care | Attending: Internal Medicine | Admitting: Internal Medicine

## 2023-09-06 ENCOUNTER — Other Ambulatory Visit

## 2023-09-06 ENCOUNTER — Emergency Department (HOSPITAL_BASED_OUTPATIENT_CLINIC_OR_DEPARTMENT_OTHER)

## 2023-09-06 ENCOUNTER — Encounter (HOSPITAL_COMMUNITY): Payer: Self-pay

## 2023-09-06 ENCOUNTER — Emergency Department (HOSPITAL_COMMUNITY)

## 2023-09-06 DIAGNOSIS — G510 Bell's palsy: Secondary | ICD-10-CM | POA: Diagnosis not present

## 2023-09-06 DIAGNOSIS — J45909 Unspecified asthma, uncomplicated: Secondary | ICD-10-CM | POA: Diagnosis not present

## 2023-09-06 DIAGNOSIS — G4733 Obstructive sleep apnea (adult) (pediatric): Secondary | ICD-10-CM | POA: Diagnosis present

## 2023-09-06 DIAGNOSIS — R202 Paresthesia of skin: Secondary | ICD-10-CM | POA: Diagnosis present

## 2023-09-06 DIAGNOSIS — R7303 Prediabetes: Secondary | ICD-10-CM | POA: Diagnosis present

## 2023-09-06 DIAGNOSIS — E785 Hyperlipidemia, unspecified: Secondary | ICD-10-CM | POA: Diagnosis present

## 2023-09-06 DIAGNOSIS — R200 Anesthesia of skin: Principal | ICD-10-CM | POA: Diagnosis present

## 2023-09-06 DIAGNOSIS — K219 Gastro-esophageal reflux disease without esophagitis: Secondary | ICD-10-CM | POA: Diagnosis present

## 2023-09-06 DIAGNOSIS — R531 Weakness: Secondary | ICD-10-CM | POA: Diagnosis not present

## 2023-09-06 DIAGNOSIS — E669 Obesity, unspecified: Secondary | ICD-10-CM | POA: Insufficient documentation

## 2023-09-06 DIAGNOSIS — R29810 Facial weakness: Secondary | ICD-10-CM | POA: Diagnosis not present

## 2023-09-06 DIAGNOSIS — Z6835 Body mass index (BMI) 35.0-35.9, adult: Secondary | ICD-10-CM | POA: Diagnosis not present

## 2023-09-06 LAB — URINALYSIS, ROUTINE W REFLEX MICROSCOPIC
Bilirubin Urine: NEGATIVE
Glucose, UA: NEGATIVE mg/dL
Ketones, ur: NEGATIVE mg/dL
Nitrite: NEGATIVE
Protein, ur: NEGATIVE mg/dL
Specific Gravity, Urine: 1.016 (ref 1.005–1.030)
pH: 5 (ref 5.0–8.0)

## 2023-09-06 LAB — COMPREHENSIVE METABOLIC PANEL WITH GFR
ALT: 34 U/L (ref 0–44)
AST: 24 U/L (ref 15–41)
Albumin: 3.8 g/dL (ref 3.5–5.0)
Alkaline Phosphatase: 63 U/L (ref 38–126)
Anion gap: 10 (ref 5–15)
BUN: 9 mg/dL (ref 6–20)
CO2: 23 mmol/L (ref 22–32)
Calcium: 8.7 mg/dL — ABNORMAL LOW (ref 8.9–10.3)
Chloride: 108 mmol/L (ref 98–111)
Creatinine, Ser: 0.87 mg/dL (ref 0.44–1.00)
GFR, Estimated: >60 mL/min (ref >60)
Glucose, Bld: 93 mg/dL (ref 70–99)
Potassium: 3.4 mmol/L — ABNORMAL LOW (ref 3.5–5.1)
Sodium: 141 mmol/L (ref 135–145)
Total Bilirubin: 1.4 mg/dL — ABNORMAL HIGH (ref 0.0–1.2)
Total Protein: 7.2 g/dL (ref 6.5–8.1)

## 2023-09-06 LAB — CBC WITH DIFFERENTIAL/PLATELET
Abs Immature Granulocytes: 0.01 K/uL (ref 0.00–0.07)
Basophils Absolute: 0.1 K/uL (ref 0.0–0.1)
Basophils Relative: 1 %
Eosinophils Absolute: 0.3 K/uL (ref 0.0–0.5)
Eosinophils Relative: 4 %
HCT: 44.6 % (ref 36.0–46.0)
Hemoglobin: 14.4 g/dL (ref 12.0–15.0)
Immature Granulocytes: 0 %
Lymphocytes Relative: 51 %
Lymphs Abs: 4.3 K/uL — ABNORMAL HIGH (ref 0.7–4.0)
MCH: 28.4 pg (ref 26.0–34.0)
MCHC: 32.3 g/dL (ref 30.0–36.0)
MCV: 88 fL (ref 80.0–100.0)
Monocytes Absolute: 0.8 K/uL (ref 0.1–1.0)
Monocytes Relative: 9 %
Neutro Abs: 2.9 K/uL (ref 1.7–7.7)
Neutrophils Relative %: 35 %
Platelets: 306 K/uL (ref 150–400)
RBC: 5.07 MIL/uL (ref 3.87–5.11)
RDW: 13.3 % (ref 11.5–15.5)
WBC: 8.4 K/uL (ref 4.0–10.5)
nRBC: 0 % (ref 0.0–0.2)

## 2023-09-06 LAB — CBG MONITORING, ED: Glucose-Capillary: 87 mg/dL (ref 70–99)

## 2023-09-06 LAB — MAGNESIUM: Magnesium: 2.1 mg/dL (ref 1.7–2.4)

## 2023-09-06 LAB — PREGNANCY, URINE: Preg Test, Ur: NEGATIVE

## 2023-09-06 LAB — PHOSPHORUS: Phosphorus: 2.6 mg/dL (ref 2.5–4.6)

## 2023-09-06 MED ORDER — ONDANSETRON HCL 4 MG/2ML IJ SOLN
4.0000 mg | Freq: Four times a day (QID) | INTRAMUSCULAR | Status: DC | PRN
  Administered 2023-09-06 (×2): 4 mg via INTRAVENOUS
  Filled 2023-09-06: qty 2

## 2023-09-06 MED ORDER — ACETAMINOPHEN 325 MG PO TABS: 650.0000 mg | ORAL_TABLET | Freq: Four times a day (QID) | ORAL | Status: DC | PRN

## 2023-09-06 MED ORDER — ONDANSETRON HCL 4 MG PO TABS: 4.0000 mg | ORAL_TABLET | Freq: Four times a day (QID) | ORAL | Status: DC | PRN

## 2023-09-06 MED ORDER — ACETAMINOPHEN 650 MG RE SUPP: 650.0000 mg | Freq: Four times a day (QID) | RECTAL | Status: DC | PRN

## 2023-09-06 MED ORDER — GADOBUTROL 1 MMOL/ML IV SOLN
8.0000 mL | Freq: Once | INTRAVENOUS | Status: AC | PRN
  Administered 2023-09-06 (×2): 8 mL via INTRAVENOUS

## 2023-09-06 MED ORDER — STROKE: EARLY STAGES OF RECOVERY BOOK
Freq: Once | Status: DC
  Filled 2023-09-06: qty 1

## 2023-09-06 MED ORDER — DIAZEPAM 5 MG/ML IJ SOLN
2.5000 mg | Freq: Once | INTRAMUSCULAR | Status: AC
  Administered 2023-09-06 (×2): 2.5 mg via INTRAVENOUS
  Filled 2023-09-06: qty 2

## 2023-09-06 MED ORDER — OXYCODONE HCL 5 MG PO TABS
5.0000 mg | ORAL_TABLET | Freq: Once | ORAL | Status: AC | PRN
  Administered 2023-09-07 (×2): 5 mg via ORAL
  Filled 2023-09-06: qty 1

## 2023-09-06 MED ORDER — IOHEXOL 350 MG/ML SOLN
100.0000 mL | Freq: Once | INTRAVENOUS | Status: AC | PRN
  Administered 2023-09-06 (×2): 100 mL via INTRAVENOUS

## 2023-09-06 MED ORDER — POTASSIUM CHLORIDE CRYS ER 20 MEQ PO TBCR
40.0000 meq | EXTENDED_RELEASE_TABLET | Freq: Once | ORAL | Status: AC
  Administered 2023-09-06 (×2): 40 meq via ORAL
  Filled 2023-09-06: qty 2

## 2023-09-06 NOTE — Telephone Encounter (Signed)
 Per chart review patient in ED

## 2023-09-06 NOTE — ED Provider Notes (Signed)
 [place] EMERGENCY DEPARTMENT AT [facility] Provider Note   CSN: [id number] Arrival date & time: 09/06/23  1022     Patient presents with: Numbness   Kayla Sexton is a 43 y.o. female.   HPI      43 year old female with a history of migraine, kidney stones, PCOS, cervical cancer, thyroid  nodule, recent tonsillitis, who presents with concern for left face, arm and leg numbness and weakness.  Initially in triage it was understood that she had presented with left-sided numbness since Sunday, but on my evaluation she clarifies that she had left-sided numbness on Sunday, resolution of her symptoms by Monday morning, and return of her symptoms again today--with what she estimates is last known well of 9 AM.  Reports that she went to drink something and had dribbling out of her mouth and that her speech sounded slurred.  She also noticed numbness of her left face arm and leg.  She did not note any difficulty walking but does feel now that her left leg seems weaker.  She had sensation of heaviness of her left arm.  She is not having visual changes.  She has had continuous left ear pain and noises in her left ear and has seen the ENT for this.  She has had nausea over the last 2 weeks, weight loss.  She was recently treated for tonsillitis.  She is supposed to have a an ultrasound of her thyroid  today for a nodule  Past Medical History:  Diagnosis Date   Anemia    Asthma    Cancer (HCC) 2006   CERVICAL    CIN III (cervical intraepithelial neoplasia III)    Dysuria    Family history of adverse reaction to anesthesia    DAD AND BROTHER AND MOM-N/V   GERD (gastroesophageal reflux disease)    OCC   Hematuria    HH (hiatus hernia)    History of cervical dysplasia 2006   CIN1 and CIN2   History of kidney stones    Migraines    Nephrolithiasis, uric acid    PCOS (polycystic ovarian syndrome)    Dr. Mat   PONV (postoperative nausea and vomiting)    HARD TO WAKE UP    Right ureteral calculus    Sleep apnea    DOES NOT USE CPAP   Urinary incontinence without sensory awareness      Prior to Admission medications   Medication Sig Start Date End Date Taking? Authorizing Provider  AIRSUPRA 90-80 MCG/ACT AERO Inhale 2 puffs into the lungs See admin instructions. Inhale 2 puffs into the lungs six times a day as needed for the prevention of bronchoconstriction and/or to reduce the risk of asthma exacerbations   Yes [provider]  ALEVE 220 MG tablet Take 220-440 mg by mouth 2 (two) times daily as needed (for pain).   Yes [provider]  CLARITIN 10 MG tablet Take 10 mg by mouth daily as needed for allergies or rhinitis.   Yes [provider]  cyclobenzaprine  (FLEXERIL ) 5 MG tablet Take 5 mg by mouth daily as needed for muscle spasms. 12/29/22  Yes [provider]  famotidine  (PEPCID ) 20 MG tablet TAKE 1 TABLET BY MOUTH TWICE A DAY Patient taking differently: Take 20 mg by mouth 2 (two) times daily as needed for heartburn or indigestion. 01/23/23  Yes Rilla Baller, MD  FLONASE  ALLERGY RELIEF 50 MCG/ACT nasal spray Place 1 spray into both nostrils 2 (two) times daily as needed for  allergies or rhinitis.   Yes [provider]  albuterol  (VENTOLIN  HFA) 108 (90 Base) MCG/ACT inhaler Inhale 2 puffs into the lungs every 6 (six) hours as needed for wheezing or shortness of breath. Patient not taking: Reported on 09/06/2023 11/04/22   Gladis Elsie BROCKS, PA-C  fluconazole  (DIFLUCAN ) 150 MG tablet Take 1 tablet if symptoms of vaginal yeast infection associated with antibiotic use, may repeat at the end of the course of antibiotics if needed. Patient not taking: Reported on 09/06/2023 02/15/23   Avelina Greig BRAVO, MD  Vibegron  (GEMTESA ) 75 MG TABS Take 1 tablet (75 mg total) by mouth daily. Patient not taking: Reported on 09/06/2023 03/21/23   Vaillancourt, Samantha, PA-C    Allergies: Adhesive [tape], Alpha-gal, Other, Metformin and  related, Bactrim  [sulfamethoxazole -trimethoprim ], and Vancomycin     Review of Systems  Updated Vital Signs BP (!) 127/93   Pulse 97   Temp 98.3 F (36.8 C) (Oral)   Resp 17   Ht 5' 1 (1.549 m)   Wt 85.7 kg   LMP 09/29/2020 (Approximate) Comment: PCOS  SpO2 100%   BMI 35.71 kg/m   Physical Exam Constitutional:      General: She is not in acute distress.    Appearance: Normal appearance. She is not ill-appearing.  HENT:     Head: Normocephalic and atraumatic.  Eyes:     General: No visual field deficit.    Extraocular Movements: Extraocular movements intact.     Conjunctiva/sclera: Conjunctivae normal.     Pupils: Pupils are equal, round, and reactive to light.  Cardiovascular:     Rate and Rhythm: Normal rate and regular rhythm.     Pulses: Normal pulses.  Pulmonary:     Effort: Pulmonary effort is normal. No respiratory distress.  Musculoskeletal:        General: No swelling or tenderness.     Cervical back: Normal range of motion.  Skin:    General: Skin is warm and dry.     Findings: No erythema or rash.  Neurological:     General: No focal deficit present.     Mental Status: She is alert and oriented to person, place, and time.     GCS: GCS eye subscore is 4. GCS verbal subscore is 5. GCS motor subscore is 6.     Cranial Nerves: Cranial nerve deficit (CN7, mild forehead weakness and lid weakness as well) present. No dysarthria or facial asymmetry.     Sensory: Sensory deficit (left arm,leg, face) present.     Motor: Weakness (mild left arm, left leg 4/5) present. No tremor.     Coordination: Coordination normal. Finger-Nose-Finger Test normal.     (all labs ordered are listed, but only abnormal results are displayed) Labs Reviewed  CBC WITH DIFFERENTIAL/PLATELET - Abnormal; Notable for the following components:      Result Value   Lymphs Abs 4.3 (*)    All other components within normal limits  COMPREHENSIVE METABOLIC PANEL WITH GFR - Abnormal; Notable for  the following components:   Potassium 3.4 (*)    Calcium 8.7 (*)    Total Bilirubin 1.4 (*)    All other components within normal limits  URINALYSIS, ROUTINE W REFLEX MICROSCOPIC - Abnormal; Notable for the following components:   APPearance HAZY (*)    Hgb urine dipstick MODERATE (*)    Leukocytes,Ua TRACE (*)    Bacteria, UA RARE (*)    All other components within normal limits  PREGNANCY, URINE  RPR  MAGNESIUM  PHOSPHORUS  CBG MONITORING, ED    EKG: EKG Interpretation Date/Time:  Tuesday September 06 2023 11:10:18 EDT Ventricular Rate:  95 PR Interval:  143 QRS Duration:  100 QT Interval:  358 QTC Calculation: 450 R Axis:   -15  Text Interpretation: Sinus rhythm Borderline left axis deviation Consider anterior infarct No significant change since last tracing Confirmed by Dreama Longs ([id number]) on 09/06/2023 12:18:40 PM  Radiology: MR Brain W and Wo Contrast Result Date: 09/06/2023 CLINICAL DATA:  Facial droop and left arm weakness EXAM: MRI HEAD WITHOUT AND WITH CONTRAST TECHNIQUE: Multiplanar, multiecho pulse sequences of the brain and surrounding structures were obtained without and with intravenous contrast. CONTRAST:  8mL GADAVIST  GADOBUTROL  1 MMOL/ML IV SOLN COMPARISON:  CT September 06, 2023 FINDINGS: MRI brain: The brain volume is normal. The signal in the brain parenchyma is normal. There is no acute or chronic infarct. The ventricles are normal. No mass lesion. There are normal flow signals in the carotid arteries and basilar artery. No significant bone marrow signal abnormality. No significant abnormality in the paranasal sinuses or soft tissues. IMPRESSION: Normal Electronically Signed   By: Nancyann Burns M.D.   On: 09/06/2023 14:33   CT Head Wo Contrast Result Date: 09/06/2023 CLINICAL DATA:  Neuro deficit, acute, stroke suspected. Left-sided facial, arm, and leg numbness. Facial droop. Dizziness and slurred speech. EXAM: CT HEAD WITHOUT CONTRAST TECHNIQUE: Contiguous  axial images were obtained from the base of the skull through the vertex without intravenous contrast. RADIATION DOSE REDUCTION: This exam was performed according to the departmental dose-optimization program which includes automated exposure control, adjustment of the mA and/or kV according to patient size and/or use of iterative reconstruction technique. COMPARISON:  CT maxillofacial 09/26/2011 FINDINGS: Brain: There is no evidence of an acute infarct, intracranial hemorrhage, mass, midline shift, or extra-axial fluid collection. Cerebral volume is normal. The ventricles are normal in size and configuration. Vascular: No hyperdense vessel. Skull: No fracture or suspicious lesion. Sinuses/Orbits: Visualized paranasal sinuses and mastoid air cells are clear. Unremarkable orbits. Other: None. IMPRESSION: Negative head CT. Electronically Signed   By: Dasie Hamburg M.D.   On: 09/06/2023 11:10     Procedures   Medications Ordered in the ED  acetaminophen  (TYLENOL ) tablet 650 mg (has no administration in time range)    Or  acetaminophen  (TYLENOL ) suppository 650 mg (has no administration in time range)  ondansetron  (ZOFRAN ) tablet 4 mg (has no administration in time range)    Or  ondansetron  (ZOFRAN ) injection 4 mg (has no administration in time range)  diazepam  (VALIUM ) injection 2.5 mg (2.5 mg Intravenous Given 09/06/23 1310)  gadobutrol  (GADAVIST ) 1 MMOL/ML injection 8 mL (8 mLs Intravenous Contrast Given 09/06/23 1345)  iohexol  (OMNIPAQUE ) 350 MG/ML injection 100 mL (100 mLs Intravenous Contrast Given 09/06/23 1601)                                      43 year old female with a history of migraine, kidney stones, PCOS, cervical cancer, thyroid  nodule, recent tonsillitis, who presents with concern for left face, arm and leg numbness and weakness.  Differential diagnosis includes CVA, MS, other neoplastic process, ICH, electrolyte abnormalities, complicated migraine, Bell's palsy.  Initially,  code stroke was not called given understanding that her symptoms have been ongoing, however on my history she clarifies that her last known normal was 9 AM with resolution of her prior symptoms before that, and  given some weakness on exam within code stroke window code stroke was called.  CT head was completed personally by interpreted by me and radiology showed no evidence of acute hemorrhage or other acute abnormalities.  Teleneurology evaluated her, recommend brain MRI with and without contrast with thin slices of the brainstem, CTA, admission to the hospital with consideration of other workup pending other results. Has a mixed picture with some exam findings consistent with a Bell's palsy, while others are more suggestive of a central process with left-sided arm/leg numbness and weakness      Final diagnoses:  Numbness and tingling of left arm and leg  Bell's palsy  Facial droop    ED Discharge Orders     None          Dreama Longs, MD 09/06/23 1615

## 2023-09-06 NOTE — Telephone Encounter (Signed)
Will await ER evaluation

## 2023-09-06 NOTE — Telephone Encounter (Signed)
 FYI Only or Action Required?: FYI only for provider.  Patient was last seen in primary care on 02/15/2023 by Avelina Greig BRAVO, MD.  Called Nurse Triage reporting facial numbness (Facial numbness, droop).  Symptoms began several days ago.  Interventions attempted: Rest, hydration, or home remedies.  Symptoms are: gradually worsening.  Triage Disposition: Go to ED Now (or PCP Triage)  Patient/caregiver understands and will follow disposition?: yes    Reason for Disposition  Bell's palsy suspected (i.e., weakness on only one side of the face, developing over hours to days, no other symptoms)  Answer Assessment - Initial Assessment Questions 1. SYMPTOM: What is the main symptom you are concerned about? (e.g., weakness, numbness)     Numbness- left side, mouth drooping R side of face 2. ONSET: When did this start? (e.g., minutes, hours, days; while sleeping)     Numbness- Sunday, mouth changes today 3. LAST NORMAL: When was the last time you (the patient) were normal (no symptoms)?     Sunday 4. PATTERN Does this come and go, or has it been constant since it started?  Is it present now?     constant 5. CARDIAC SYMPTOMS: Have you had any of the following symptoms: chest pain, difficulty breathing, palpitations?     Patient was at hospital Saturday  6. NEUROLOGIC SYMPTOMS: Have you had any of the following symptoms: headache, dizziness, vision loss, double vision, changes in speech, unsteady on your feet?     Sore throat, ear ache  Protocols used: Neurologic [facility]   Copied from CRM #[id number]. Topic: Clinical - Red Word Triage >> Sep 06, 2023  9:41 AM [place] J wrote: [facility] that prompted transfer to Nurse Triage: Patient saw that this morning her left side of her face is numb and the right side is drooping. She said when she talks she talks with a droop as well. She also feels tingling on her face.

## 2023-09-06 NOTE — ED Notes (Signed)
 Pt to MRI

## 2023-09-06 NOTE — ED Provider Triage Note (Addendum)
 Emergency Medicine Provider Triage Evaluation Note  Kayla Sexton , a 43 y.o. female  was evaluated in triage.  Pt complains of numbness and facial droop. States recently diagnosed with thyroid  nodule and viral pharyngitis. Noticed left sided numbness two days ago. Reports that she woke up this morning with a left sided facial droop and difficulty closing the left eye and raise the left eye brow.  Review of Systems  Positive: As above Negative: As above  Physical Exam  BP (!) 134/105 (BP Location: Right Arm)   Pulse (!) 106   Temp 98 F (36.7 C) (Oral)   Resp 16   Ht 5' 1 (1.549 m)   Wt 85.7 kg   LMP 09/29/2020 (Approximate) Comment: PCOS  SpO2 100%   BMI 35.71 kg/m  Gen:   Awake, no distress   Resp:  Normal effort  MSK:   Moves extremities without difficulty  Other:  Left sided facial droop with difficulty closing the left eye against resistance. Left forehead does not wrinkle. Left sided subjective numbness from in upper and lower extremities. Strength 4/5 in left upper and lower extremities compared to 5/5 on right side.  Medical Decision Making  Medically screening exam initiated at 10:33 AM.  Appropriate orders placed.  NUSAYBAH IVIE was informed that the remainder of the evaluation will be completed by another provider, this initial triage assessment does not replace that evaluation, and the importance of remaining in the ED until their evaluation is complete.  Exam is mixed but not likely acute stroke with numbness starting 2 days ago and facial features presenting today more consistent with Bells Palsy.   Nyshawn Gowdy A, PA-C 09/06/23 1036    Abagail Limb A, PA-C 09/06/23 1042

## 2023-09-06 NOTE — ED Notes (Signed)
 18G US  PIV placed. Pt ready for imaging.

## 2023-09-06 NOTE — Consult Note (Addendum)
 Addendum: reviewed MRI imaging, agree with radiology that the exam is normal. Reviewed CTA head/neck and there does not appear to be any lvo or significant stenosis or aneurysm, radiology read pending. Favor peripheral etiology for her symptoms, possible infectious or post-viral syndrome. Unclear what is causing her ear pain, consider ENT consult. Agree with treating for Bell's palsy. No indication for antiplatelet.   [facility] Consult [facility]   Patient Name:   Kayla Sexton, Kayla Sexton Date of Birth:   12-03-1980 Identification Number:   MRN - [id number] Date of Service:   09/06/2023 11:52:55  Diagnosis:       G51.0 - Bell palsy and i63.89 stroke  Impression:      The hemi-body sensory change suggests cerebral lesion, but the severe Left ear pain and Left hemifacial weakness is most suggestive of a CN 7 lesion. Additionally she also has two weeks of nausea with unintended weight loss and abdominal rash.  CTH no acute findings.  It is possible that more than one neurologic problem is present, acute ischemic stroke with a bell's palsy possible, reasonable to treat for both for now. She may have an occult malignancy or immunodeficiency or infectious or autoimmune process. She would benefit from an Left ear examination to assess for possible viral rash or infection of the ear.  Would like to obtain MRI brain with and without contrast with thin cuts through the brainstem to evaluate for central lesions.  Consider work for up occult malignancy and infectious disease consult as well.  Our recommendations are outlined below.  Recommendations:        Stroke/Telemetry Floor       Neuro Checks (Q4)       Bedside Swallow Eval       DVT Prophylaxis       IV Fluids, Normal Saline       Head of Bed 30 Degrees       Euglycemia and Avoid Hyperthermia (PRN Acetaminophen )       Bolus with Clopidogrel 300 mg bolus x1 and initiate dual antiplatelet therapy with Aspirin 81 mg  daily and Clopidogrel 75 mg daily       Antihypertensives PRN if Blood pressure is greater than 220/120 or there is a concern for End organ damage/contraindications for permissive HTN. If blood pressure is greater than 220/120 give labetalol PO or IV or Vasotec IV with a goal of 15% reduction in BP during the first 24 hours.       MRI brain with and without contrast, thin cuts through brainstem       CTA/CTP pending       a1c, lipid panel       HIV       prednisone  50 to 60 mg per day for five days followed by a five-day taper) consider adding antiviral therapy       Eye protection and eye drops for the affected eye to prevent corneal abrasion and injury       Further work up per primary team, consider ID consult for ear pain and rash  Sign Out:       Discussed with Emergency Department Provider    ------------------------------------------------------------------------------  Advanced Imaging: Advanced imaging has been ordered. Results pending.   Metrics: Last Known Well: 09/06/2023 09:00:00 Dispatch Time: 09/06/2023 11:52:55 Arrival Time: 09/06/2023 11:54:43 Initial Response Time: 09/06/2023 12:06:23 Symptoms: Facial weakness, ear pain, Left sided numbness. Initial patient interaction: 09/06/2023 12:06:26 NIHSS Assessment Completed: 09/06/2023 12:24:14 Patient is not a candidate for Thrombolytic. Thrombolytic  Medical Decision: 09/06/2023 12:24:14 Patient was not deemed candidate for Thrombolytic because of following reasons: Patient/Family declined .  CT Head: CT head unremarkable for acute infarction or hemorrhage per Radiology: I personally reviewed all the CT images that were available to me and it showed no acute hemorrhage or core infarct.  Primary Provider Notified of Diagnostic Impression and Management Plan on: 09/06/2023 13:09:48    ------------------------------------------------------------------------------  History of Present Illness: Patient is a 43 year  old Female.  Patient was brought by private transportation with symptoms of Facial weakness, ear pain, Left sided numbness. Kayla Sexton [phone] F pmh migraine, kidney stones, PCOS, cervical cancer, thyroid  nodule  Sunday in Left mouth/arm/leg numbness, it resolved throughout the day. No symptoms on Monday. She awoke normal 0730am, about 0900am she noticed her mouth felt funny and saw a facial droop. Associated with Left face/hand numbness, her left hand feels like it takes more work to move but is having no trouble actually using it, she is Right handed. Has been having extreme pain in ear on her Left side, no discharge. felt like my ear was gonna bust because of the pain in her Left ear. Has a rash on her breast and stomach. Intermittent headaches. Has had significant nausea for last two weeks, difficulty keeping food down, as soon as it hits my stomach it wants to come back up, persistent cough, tickle in my throat. Says she lost 20lbs in the last two weeks. Recently dx with a Thyroid  nodule. She works as a Financial controller.  In the last week has finished a course of penicillin, doxycycline , and 5 days of oral prednisone . Her mother is at bedside.   Past Medical History:      There is no history of Diabetes Mellitus      There is no history of Atrial Fibrillation      There is no history of Stroke  Medications:  No Anticoagulant use  No Antiplatelet use Reviewed EMR for current medications  Allergies:  Reviewed  Social History: Smoking: No Alcohol Use: No Drug Use: No  Family History:  There is no family history of premature cerebrovascular disease pertinent to this consultation  ROS : 14 Points Review of Systems was performed and was negative except mentioned in HPI.  Past Surgical History: There Is No Surgical History Contributory To Today's Visit    Examination: BP(127/93), Pulse(100), Blood Glucose(87) 1A: Level of Consciousness - Alert; keenly  responsive + 0 1B: Ask Month and Age - Both Questions Right + 0 1C: Blink Eyes & Squeeze Hands - Performs Both Tasks + 0 2: Test Horizontal Extraocular Movements - Normal + 0 3: Test Visual Fields - No Visual Loss + 0 4: Test Facial Palsy (Use Grimace if Obtunded) - Unilateral Complete paralysis (upper/lower face) + 3 5A: Test Left Arm Motor Drift - No Drift for 10 Seconds + 0 5B: Test Right Arm Motor Drift - No Drift for 10 Seconds + 0 6A: Test Left Leg Motor Drift - No Drift for 5 Seconds + 0 6B: Test Right Leg Motor Drift - No Drift for 5 Seconds + 0 7: Test Limb Ataxia (FNF/Heel-Shin) - No Ataxia + 0 8: Test Sensation - Mild-Moderate Loss: Can Sense Being Touched + 1 9: Test Language/Aphasia - Normal; No aphasia + 0 10: Test Dysarthria - Normal + 0 11: Test Extinction/Inattention - No abnormality + 0  NIHSS Score: 4  NIHSS Free Text : House brackman grade 4 Left face. Decreased LT Left face/arm.  Pre-Morbid Modified Rankin Scale: 0 Points = No symptoms at all  Spoke with : Dr Dreama I reviewed the available imaging via Rapid and initiated discussion with the primary provider  This consult was conducted in real time using interactive audio and video technology. Patient was informed of the technology being used for this visit and agreed to proceed. Patient located in hospital and provider located at home/office setting.   Patient is being evaluated for possible acute neurologic impairment and high probability of imminent or life-threatening deterioration. I spent total of 20 minutes providing care to this patient, including time for face to face visit via telemedicine, review of medical records, imaging studies and discussion of findings with providers, the patient and/or family.    Dr Lamar Senters   [facility] For Inpatient follow-up with [facility] physician please call [facility] at [phone]. As we are not an outpatient service for any post hospital discharge  needs please contact the hospital for assistance. If you have any questions for the [facility] physicians or need to reconsult for clinical or diagnostic changes please contact us  via [facility] at [phone].   Signature : Lamar Senters

## 2023-09-06 NOTE — H&P (Signed)
 History and Physical    Patient: Kayla Sexton FMW:[id number] DOB: [birth year] DOA: 09/06/2023 DOS: the patient was seen and examined on 09/06/2023 PCP: Rilla Baller, MD  Patient coming from: Home  Chief Complaint:  Chief Complaint  Patient presents with   Numbness   HPI: Kayla Sexton is a 43 y.o. female with medical history significant of anemia, asthma, cervical cancer, dysuria, GERD, hiatal hernia, hematuria, cervical dysplasia migraine headaches, sleep apnea not on CPAP, alpha gal hypersensitivity, prediabetes, dyslipidemia, class II obesity who presented to the emergency department with left ear pain, facial droop and left-sided numbness since Sunday.  No vision changes, no gait abnormality. He denied fever, chills, rhinorrhea, sore throat, wheezing or hemoptysis.  No chest pain, palpitations, diaphoresis, PND, orthopnea or pitting edema of the lower extremities.  No abdominal pain, nausea, emesis, diarrhea, constipation, melena or hematochezia.  No flank pain, dysuria, frequency or hematuria.  No polyuria, polydipsia, polyphagia or blurred vision.   Lab work: CBC showed a white count of 8.4, hemoglobin 14.4 g/dL platelets 695.  Normal magnesium and phosphorus.  Urine pregnancy test was negative.  Potassium is 3.4 mmol/L, total bilirubin 1.4 mg/dL and calcium normalizes after correction to albumin.  The rest of the CMP measurements were normal.  Imaging: Negative head CT.  Normal MRI of brain.  CTA head and neck with no large vessel occlusion, hemodynamically significant stenosis or aneurysm in the head or the neck.  There is fetal type origin of the right posterior cerebral artery.  ED course: Initial vital signs were temperature 98 F, pulse 96, respiration 16, BP 134/105 and O2 sat 100% on room air.  Patient received diazepam  2.5 mg IVP prior to MRI.    Review of Systems: As mentioned in the history of present illness. All other systems reviewed and are negative.  Past Medical  History:  Diagnosis Date   Anemia    Asthma    Cancer (HCC) 2006   CERVICAL    CIN III (cervical intraepithelial neoplasia III)    Dysuria    Family history of adverse reaction to anesthesia    DAD AND BROTHER AND MOM-N/V   GERD (gastroesophageal reflux disease)    OCC   Hematuria    HH (hiatus hernia)    History of cervical dysplasia 2006   CIN1 and CIN2   History of kidney stones    Migraines    Nephrolithiasis, uric acid    PCOS (polycystic ovarian syndrome)    Dr. Mat   PONV (postoperative nausea and vomiting)    HARD TO WAKE UP   Right ureteral calculus    Sleep apnea    DOES NOT USE CPAP   Urinary incontinence without sensory awareness    Past Surgical History:  Procedure Laterality Date   ABDOMINAL HYSTERECTOMY     total / laporscopic   CERVICAL BIOPSY  W/ LOOP ELECTRODE EXCISION  12-04-2004   dr mat  [facility]   COLONOSCOPY  03/2008   Dr. Dianna   COMBINED HYSTEROSCOPY DIAGNOSTIC / D&C  02-28-2012   dr mat   @ [facility]   CYSTOSCOPY W/ RETROGRADES Right 03/31/2018   Procedure: CYSTOSCOPY WITH RETROGRADE PYELOGRAM;  Surgeon: Francisca Redell BROCKS, MD;  Location: [facility];  Service: Urology;  Laterality: Right;   CYSTOSCOPY W/ RETROGRADES  11/11/2020   Procedure: CYSTOSCOPY WITH RETROGRADE PYELOGRAM;  Surgeon: Francisca Redell BROCKS, MD;  Location: [facility];  Service: Urology;;   CYSTOSCOPY WITH RETROGRADE PYELOGRAM, URETEROSCOPY AND STENT PLACEMENT Bilateral 07/21/2017  Procedure: CYSTOSCOPY WITH BILATERAL  RETROGRADE BILATERAL URETEROSCOPY AND STENTS  PLACEMENT;  Surgeon: Watt Rush, MD;  Location: [facility];  Service: Urology;  Laterality: Bilateral;   CYSTOSCOPY WITH RETROGRADE URETHROGRAM Left 01/04/2020   Procedure: CYSTOSCOPY WITH RETROGRADE URETHROGRAM;  Surgeon: Francisca Redell BROCKS, MD;  Location: [facility];  Service: Urology;  Laterality: Left;   CYSTOSCOPY/URETEROSCOPY/HOLMIUM LASER/STENT PLACEMENT Left 03/17/2018   Procedure:  CYSTOSCOPY/URETEROSCOPY/HOLMIUM LASER/STENT PLACEMENT;  Surgeon: Francisca Redell BROCKS, MD;  Location: [facility];  Service: Urology;  Laterality: Left;   CYSTOSCOPY/URETEROSCOPY/HOLMIUM LASER/STENT PLACEMENT Right 03/31/2018   Procedure: CYSTOSCOPY/URETEROSCOPY/HOLMIUM LASER/STENT PLACEMENT;  Surgeon: Francisca Redell BROCKS, MD;  Location: [facility];  Service: Urology;  Laterality: Right;   CYSTOSCOPY/URETEROSCOPY/HOLMIUM LASER/STENT PLACEMENT Left 01/04/2020   Procedure: CYSTOSCOPY/URETEROSCOPY/HOLMIUM LASER;  Surgeon: Francisca Redell BROCKS, MD;  Location: [facility];  Service: Urology;  Laterality: Left;   CYSTOSCOPY/URETEROSCOPY/HOLMIUM LASER/STENT PLACEMENT Left 11/11/2020   Procedure: CYSTOSCOPY/URETEROSCOPY/HOLMIUM LASER;  Surgeon: Francisca Redell BROCKS, MD;  Location: [facility];  Service: Urology;  Laterality: Left;   CYSTOSCOPY/URETEROSCOPY/HOLMIUM LASER/STENT PLACEMENT Left 07/02/2022   Procedure: CYSTOSCOPY/URETEROSCOPY/HOLMIUM LASER FOR STONE;  Surgeon: Francisca Redell BROCKS, MD;  Location: [facility];  Service: Urology;  Laterality: Left;   CYSTOSCOPY/URETEROSCOPY/HOLMIUM LASER/STENT PLACEMENT Left 03/25/2023   Procedure: CYSTOSCOPY/URETEROSCOPY/HOLMIUM LASER;  Surgeon: Francisca Redell BROCKS, MD;  Location: [facility];  Service: Urology;  Laterality: Left;   DILATION AND CURETTAGE OF UTERUS     endoscopy  2010   Dr. Dyane   EXTRACORPOREAL SHOCK WAVE LITHOTRIPSY  08/2021   FINGER SURGERY Right    MIDDLE FINGER   FOOT SURGERY Left 2015   5th bunionectomy w/ 2nd digit w/ fusion , retained hardware w/ 5th toe   FRACTURE SURGERY     left shoulder   HOLMIUM LASER APPLICATION Bilateral 07/21/2017   Procedure: HOLMIUM LASER APPLICATION;  Surgeon: Watt Rush, MD;  Location[facility];  Service: Urology;  Laterality: Bilateral;   Social History:  reports that she has never smoked. She has never been exposed to tobacco smoke. She has never used smokeless tobacco. She reports that she does not drink alcohol and  does not use drugs.  Allergies  Allergen Reactions   Adhesive [Tape] Swelling    Per pt only the 12 lead ekg pads cause welps and abrasions-ALSO SOME BANDAIDS CAUSE IRRITATION-OK TO USE PAPER TAPE Adhesive on the EKG pads   Metformin And Related Other (See Comments)    GI upset    Other     RED MEAT-ANAPHYLAXIS  General Anesthesia - nausea and vomiting (scopolamine  patch doesn't work)   Vancomycin  Hives and Itching   Wound Dressing Adhesive    Bactrim  [Sulfamethoxazole -Trimethoprim ] Rash    Family History  Problem Relation Age of Onset   Healthy Mother    Healthy Father    Breast cancer Paternal Grandmother    Breast cancer Other        Maternal Great Aunt    Prior to Admission medications   Medication Sig Start Date End Date Taking? Authorizing Provider  albuterol  (VENTOLIN  HFA) 108 (90 Base) MCG/ACT inhaler Inhale 2 puffs into the lungs every 6 (six) hours as needed for wheezing or shortness of breath. 11/04/22   Gladis Elsie BROCKS, PA-C  cyclobenzaprine  (FLEXERIL ) 5 MG tablet Take 5 mg by mouth daily as needed for muscle spasms. 12/29/22   [provider]  famotidine  (PEPCID ) 20 MG tablet TAKE 1 TABLET BY MOUTH TWICE A DAY 01/23/23   Rilla Baller, MD  fluconazole  (DIFLUCAN ) 150  MG tablet Take 1 tablet if symptoms of vaginal yeast infection associated with antibiotic use, may repeat at the end of the course of antibiotics if needed. Patient not taking: Reported on 03/21/2023 02/15/23   Avelina Greig BRAVO, MD  naproxen sodium (ALEVE) 220 MG tablet Take 220 mg by mouth 2 (two) times daily as needed (Pain).    [provider]  Vibegron  (GEMTESA ) 75 MG TABS Take 1 tablet (75 mg total) by mouth daily. 03/21/23   Maurine Lukes, PA-C    Physical Exam: Vitals:   09/06/23 1031 09/06/23 1032 09/06/23 1155 09/06/23 1256  BP: (!) 134/105  (!) 127/93   Pulse: (!) 106  97   Resp: 16  17   Temp: 98 F (36.7 C)   98.3 F (36.8 C)  TempSrc: Oral   Oral  SpO2:  100%  100%   Weight:  85.7 kg    Height:  5' 1 (1.549 m)     Physical Exam Vitals and nursing note reviewed.  Constitutional:      General: She is awake. She is not in acute distress.    Appearance: Normal appearance. She is ill-appearing.  HENT:     Head: Normocephalic.     Nose: No rhinorrhea.     Mouth/Throat:     Mouth: Mucous membranes are moist.  Eyes:     General: No scleral icterus.    Pupils: Pupils are equal, round, and reactive to light.  Neck:     Vascular: No JVD.  Cardiovascular:     Rate and Rhythm: Normal rate and regular rhythm.     Heart sounds: S1 normal and S2 normal.  Pulmonary:     Effort: Pulmonary effort is normal.     Breath sounds: Normal breath sounds. No wheezing, rhonchi or rales.  Abdominal:     General: Bowel sounds are normal. There is no distension.     Palpations: Abdomen is soft.     Tenderness: There is no abdominal tenderness. There is no right CVA tenderness or left CVA tenderness.  Musculoskeletal:     Cervical back: Neck supple.     Right lower leg: No edema.     Left lower leg: No edema.  Skin:    General: Skin is warm and dry.  Neurological:     General: No focal deficit present.     Mental Status: She is alert and oriented to person, place, and time.  Psychiatric:        Mood and Affect: Mood normal.        Behavior: Behavior normal. Behavior is cooperative.      Data Reviewed:  Results are pending, will review when available.  EKG: Vent. rate 95 BPM PR interval 143 ms QRS duration 100 ms QT/QTcB 358/450 ms P-R-T axes 43 -15 37 Sinus rhythm Borderline left axis deviation  Assessment and Plan: Principal Problem:   Left sided numbness Observation/telemetry. Frequent neurochecks. Consult PT and OT. Check fasting lipids. Check hemoglobin A1c. Check echocardiogram. Risk factors modifications. Stroke team input appreciated.  Active Problems:   Severe obesity (BMI 35.0-39.9) with comorbidity (HCC) Current BMI  35.71 kg/m. Would benefit from lifestyle modifications. Follow-up closely with PCP.    OSA (obstructive sleep apnea) CPAP while in the hospital.    Prediabetes Check hemoglobin A1c.    Dyslipidemia Check fasting lipids.    GERD (gastroesophageal reflux disease) Antiacid, H2 blocker or PPI as needed.     Advance Care Planning:   Code Status: Full Code  Consults: Teleneurology consult done. Neurohospitalist team will be seeing at [facility].  Family Communication:   Severity of Illness: The appropriate patient status for this patient is INPATIENT. Inpatient status is judged to be reasonable and necessary in order to provide the required intensity of service to ensure the patient's safety. The patient's presenting symptoms, physical exam findings, and initial radiographic and laboratory data in the context of their chronic comorbidities is felt to place them at high risk for further clinical deterioration. Furthermore, it is not anticipated that the patient will be medically stable for discharge from the hospital within 2 midnights of admission.   * I certify that at the point of admission it is my clinical judgment that the patient will require inpatient hospital care spanning beyond 2 midnights from the point of admission due to high intensity of service, high risk for further deterioration and high frequency of surveillance required.*  Author: Alm Dorn Castor, MD 09/06/2023 1:48 PM  For on call review [email or web address].   This document was prepared using Dragon voice recognition software and may contain some unintended transcription errors.

## 2023-09-06 NOTE — ED Triage Notes (Addendum)
 Patient began having left sided numbness on her face, arm, leg since Sunday. Over the last hour the numbness has worsened and feels facial paralysis that began 1 hour ago. No changes in vision. Slight dizziness. Feels like she is slurring her words. Ariel PA called to triage room and completed assessment. Now is having numbness on the right hand during triage.

## 2023-09-07 ENCOUNTER — Observation Stay (HOSPITAL_COMMUNITY)

## 2023-09-07 DIAGNOSIS — G510 Bell's palsy: Secondary | ICD-10-CM | POA: Diagnosis not present

## 2023-09-07 LAB — COMPREHENSIVE METABOLIC PANEL WITH GFR
ALT: 28 U/L (ref 0–44)
AST: 26 U/L (ref 15–41)
Albumin: 3.4 g/dL — ABNORMAL LOW (ref 3.5–5.0)
Alkaline Phosphatase: 56 U/L (ref 38–126)
Anion gap: 10 (ref 5–15)
BUN: 9 mg/dL (ref 6–20)
CO2: 20 mmol/L — ABNORMAL LOW (ref 22–32)
Calcium: 8.5 mg/dL — ABNORMAL LOW (ref 8.9–10.3)
Chloride: 109 mmol/L (ref 98–111)
Creatinine, Ser: 0.85 mg/dL (ref 0.44–1.00)
GFR, Estimated: >60 mL/min (ref >60)
Glucose, Bld: 94 mg/dL (ref 70–99)
Potassium: 3.7 mmol/L (ref 3.5–5.1)
Sodium: 139 mmol/L (ref 135–145)
Total Bilirubin: 1.5 mg/dL — ABNORMAL HIGH (ref 0.0–1.2)
Total Protein: 6.7 g/dL (ref 6.5–8.1)

## 2023-09-07 LAB — LIPID PANEL
Cholesterol: 197 mg/dL (ref 0–200)
HDL: 40 mg/dL — ABNORMAL LOW (ref >40)
LDL Cholesterol: 129 mg/dL — ABNORMAL HIGH (ref 0–99)
Total CHOL/HDL Ratio: 4.9 ratio
Triglycerides: 142 mg/dL (ref <150)
VLDL: 28 mg/dL (ref 0–40)

## 2023-09-07 LAB — HEMOGLOBIN A1C
Hgb A1c MFr Bld: 5.5 % (ref 4.8–5.6)
Mean Plasma Glucose: 111 mg/dL

## 2023-09-07 LAB — CBC
HCT: 44 % (ref 36.0–46.0)
Hemoglobin: 13.5 g/dL (ref 12.0–15.0)
MCH: 27.9 pg (ref 26.0–34.0)
MCHC: 30.7 g/dL (ref 30.0–36.0)
MCV: 90.9 fL (ref 80.0–100.0)
Platelets: 272 K/uL (ref 150–400)
RBC: 4.84 MIL/uL (ref 3.87–5.11)
RDW: 13.2 % (ref 11.5–15.5)
WBC: 7.8 K/uL (ref 4.0–10.5)
nRBC: 0 % (ref 0.0–0.2)

## 2023-09-07 LAB — RPR: RPR Ser Ql: NONREACTIVE

## 2023-09-07 MED ORDER — PROCHLORPERAZINE EDISYLATE 10 MG/2ML IJ SOLN
5.0000 mg | Freq: Once | INTRAMUSCULAR | Status: AC
  Administered 2023-09-07 (×2): 5 mg via INTRAVENOUS
  Filled 2023-09-07: qty 2

## 2023-09-07 MED ORDER — PREDNISONE 20 MG PO TABS
60.0000 mg | ORAL_TABLET | Freq: Every day | ORAL | Status: DC
  Administered 2023-09-07 (×2): 60 mg via ORAL
  Filled 2023-09-07: qty 3

## 2023-09-07 MED ORDER — VALACYCLOVIR HCL 1 G PO TABS: 1000.0000 mg | ORAL_TABLET | Freq: Three times a day (TID) | ORAL | 0 refills | Status: AC

## 2023-09-07 MED ORDER — PREDNISONE 20 MG PO TABS: ORAL_TABLET | ORAL | 0 refills | Status: DC

## 2023-09-07 MED ORDER — OXYCODONE HCL 5 MG PO TABS: 5.0000 mg | ORAL_TABLET | ORAL | Status: DC | PRN

## 2023-09-07 MED ORDER — VALACYCLOVIR HCL 500 MG PO TABS
1000.0000 mg | ORAL_TABLET | Freq: Three times a day (TID) | ORAL | Status: DC
  Administered 2023-09-07 (×2): 1000 mg via ORAL
  Filled 2023-09-07: qty 2

## 2023-09-07 MED ORDER — OXYCODONE HCL 5 MG PO TABS: 5.0000 mg | ORAL_TABLET | ORAL | 0 refills | Status: DC | PRN

## 2023-09-07 NOTE — ED Notes (Signed)
 Pt given drink and warm blankets as requested.

## 2023-09-07 NOTE — ED Notes (Addendum)
 Writer entered pt's room. Pt advised she was livid. Pt advised she had been here all night and was neglected by staff. Writer asked pt to elaborate so she could address pt concerns. Pt advised she was not provided drink and she would call out for pain medication or nausea medication and nobody would come in for hours. Writer assured pt she would speak with hospitalist.

## 2023-09-07 NOTE — Evaluation (Addendum)
 Physical Therapy Evaluation Patient Details Name: Kayla Sexton MRN: [id number] DOB: Feb 01, 1980 Today's Date: 09/07/2023  History of Present Illness  Kayla Sexton is a 43 y.o. female who presented to the emergency department 09/06/23 with left ear pain, facial droop and left-sided numbness since Sunday.8/10. facial drop remains, CT and MRI negative.EFY:jwzfpj, asthma, cervical cancer, dysuria, GERD, hiatal hernia, hematuria, cervical dysplasia migraine headaches, sleep apnea not on CPAP, alpha gal hypersensitivity, prediabetes, dyslipidemia, class II obesity  Clinical Impression  The patient is independently ambulating with no noted balance loss, no L LE weakness. PT will sign off. Patient is noted with left facial drooping.  Recommend OPPT services once definitive diagnosis is  achieved if facial drooping continues.  PT will sign off.      If plan is discharge home, recommend the following: Assist for transportation   Can travel by private vehicle        Equipment Recommendations None recommended by PT  Recommendations for Other Services       Functional Status Assessment Patient has not had a recent decline in their functional status     Precautions / Restrictions Precautions Precautions: Fall Restrictions Weight Bearing Restrictions Per Provider Order: No      Mobility  Bed Mobility Overal bed mobility: Independent                  Transfers Overall transfer level: Independent                      Ambulation/Gait Ambulation/Gait assistance: Independent Gait Distance (Feet): 400 Feet Assistive device: None Gait Pattern/deviations: WFL(Within Functional Limits)   Gait velocity interpretation: >4.37 ft/sec, indicative of normal walking speed   General Gait Details: no balance loss  Stairs            Wheelchair Mobility     Tilt Bed    Modified Rankin (Stroke Patients Only)       Balance                                              Pertinent Vitals/Pain Pain Assessment Pain Assessment: Faces Faces Pain Scale: Hurts even more Pain Location: left face Pain Descriptors / Indicators: Guarding, Discomfort Pain Intervention(s): Monitored during session    Home Living Family/patient expects to be discharged to:: Private residence Living Arrangements: Parent Available Help at Discharge: Family;Available PRN/intermittently Type of Home: House Home Access: Stairs to enter   Entrance Stairs-Number of Steps: 3 Alternate Level Stairs-Number of Steps: 14 Home Layout: Two level;Bed/bath upstairs Home Equipment: None      Prior Function Prior Level of Function : Independent/Modified Independent;Driving;Working/employed             Mobility Comments: works as Tax adviser Extremity Assessment: Overall WFL for tasks assessed    Lower Extremity Assessment Lower Extremity Assessment: Overall WFL for tasks assessed    Cervical / Trunk Assessment Cervical / Trunk Assessment:  (left facial dropp)  Communication   Communication Communication: No apparent difficulties    Cognition Arousal: Alert Behavior During Therapy: WFL for tasks assessed/performed   PT - Cognitive impairments: No apparent impairments                         Following commands:  Intact       Cueing       General Comments      Exercises     Assessment/Plan    PT Assessment Patient does not need any further acute PT services Recommend OPPT once definitive diagnosis is achieved.  PT Problem List         PT Treatment Interventions      PT Goals (Current goals can be found in the Care Plan section)  Acute Rehab PT Goals Patient Stated Goal: go home, find out what is wrong PT Goal Formulation: All assessment and education complete, DC therapy    Frequency       Co-evaluation               AM-PAC PT 6  Clicks Mobility  Outcome Measure Help needed turning from your back to your side while in a flat bed without using bedrails?: None Help needed moving from lying on your back to sitting on the side of a flat bed without using bedrails?: None Help needed moving to and from a bed to a chair (including a wheelchair)?: None Help needed standing up from a chair using your arms (e.g., wheelchair or bedside chair)?: None Help needed to walk in hospital room?: None Help needed climbing 3-5 steps with a railing? : None 6 Click Score: 24    End of Session   Activity Tolerance: Patient tolerated treatment well Patient left: in bed;with call bell/phone within reach Nurse Communication: Mobility status PT Visit Diagnosis: Other symptoms and signs involving the nervous system (R29.898)    Time: 9169-9154 PT Time Calculation (min) (ACUTE ONLY): 15 min   Charges:   PT Evaluation $PT Eval Low Complexity: 1 Low               [name] 09/07/2023, 9:00 AM

## 2023-09-07 NOTE — ED Notes (Signed)
 Pt ambulatory to the restroom

## 2023-09-07 NOTE — Discharge Summary (Signed)
 [facility]  Physician Discharge Summary   Patient ID: Kayla Sexton MRN: [id number] DOB/AGE: 04/19/1980 43 y.o.  Admit date: 09/06/2023 Discharge date: 09/07/2023    PCP: Rilla Baller, MD  DISCHARGE DIAGNOSES:  Bell's palsy    RECOMMENDATIONS FOR OUTPATIENT FOLLOW UP: Patient recommended to follow-up with her outpatient providers early next week.   CODE STATUS: Full code  DISCHARGE CONDITION: fair  Diet recommendation: As before  INITIAL HISTORY: 43 y.o. female with medical history significant of anemia, asthma, cervical cancer, dysuria, GERD, hiatal hernia, hematuria, cervical dysplasia migraine headaches, sleep apnea not on CPAP, alpha gal hypersensitivity, prediabetes, dyslipidemia, class II obesity who presented to the emergency department with left ear pain, facial droop and left-sided numbness since Sunday.  No vision changes, no gait abnormality. He denied fever, chills, rhinorrhea, sore throat, wheezing or hemoptysis.  No chest pain, palpitations, diaphoresis, PND, orthopnea or pitting edema of the lower extremities.  No abdominal pain, nausea, emesis, diarrhea, constipation, melena or hematochezia.  No flank pain, dysuria, frequency or hematuria.  No polyuria, polydipsia, polyphagia or blurred vision.    Lab work: CBC showed a white count of 8.4, hemoglobin 14.4 g/dL platelets 695.  Normal magnesium and phosphorus.  Urine pregnancy test was negative.  Potassium is 3.4 mmol/L, total bilirubin 1.4 mg/dL and calcium normalizes after correction to albumin.  The rest of the CMP measurements were normal.   Imaging: Negative head CT.  Normal MRI of brain.  CTA head and neck with no large vessel occlusion, hemodynamically significant stenosis or aneurysm in the head or the neck.  There is fetal type origin of the right posterior cerebral artery.    Consultations: Neurology   HOSPITAL COURSE:   Bell's palsy Patient presented with left-sided facial droop and  numbness.  MRI was negative for stroke.  CT angiogram head and neck negative for significant stenosis.  She was diagnosed with Bell's palsy.  Steroids and antiviral recommended by neurology.  Outpatient follow-up with PCP.     OSA (obstructive sleep apnea) Dyslipidemia GERD (gastroesophageal reflux disease)  Obesity Estimated body mass index is 35.71 kg/m as calculated from the following:   Height as of this encounter: 5' 1 (1.549 m).   Weight as of this encounter: 85.7 kg.  Patient is stable.  She was told about her negative imaging studies findings.  She was told that her presentation is consistent with Bell's palsy.  Steroids and antivirals were recommended by neurology to which she agreed.  Subsequently she was in a hurry to get out of the hospital.  Was threatening to leave AMA.  Prescriptions were sent over to her pharmacy.     PERTINENT LABS:  The results of significant diagnostics from this hospitalization (including imaging, microbiology, ancillary and laboratory) are listed below for reference.     Labs:   Basic Metabolic Panel: Recent Labs  Lab 09/06/23 1032 09/07/23 0620  NA 141 139  K 3.4* 3.7  CL 108 109  CO2 23 20*  GLUCOSE 93 94  BUN 9 9  CREATININE 0.87 0.85  CALCIUM 8.7* 8.5*  MG 2.1  --   PHOS 2.6  --    Liver Function Tests: Recent Labs  Lab 09/06/23 1032 09/07/23 0620  AST 24 26  ALT 34 28  ALKPHOS 63 56  BILITOT 1.4* 1.5*  PROT 7.2 6.7  ALBUMIN 3.8 3.4*    CBC: Recent Labs  Lab 09/06/23 1032 09/07/23 0620  WBC 8.4 7.8  NEUTROABS 2.9  --  HGB 14.4 13.5  HCT 44.6 44.0  MCV 88.0 90.9  PLT 306 272   CBG: Recent Labs  Lab 09/06/23 1156  GLUCAP 87     IMAGING STUDIES CT ANGIO HEAD NECK W WO CM W PERF (CODE STROKE) Result Date: 09/06/2023 EXAM: CTA HEAD AND NECK WITH AND WITHOUT 09/06/2023 04:16:24 PM TECHNIQUE: CTA of the head and neck was performed with and without the administration of intravenous contrast. Multiplanar 2D  and/or 3D reformatted images are provided for review. Automated exposure control, iterative reconstruction, and/or weight based adjustment of the mA/kV was utilized to reduce the radiation dose to as low as reasonably achievable. Stenosis of the internal carotid arteries measured using NASCET criteria. COMPARISON: None available CLINICAL HISTORY: Neuro deficit, acute, stroke suspected; Left hemifacial weakness, Left hemibody decreased sensation. FINDINGS: CTA NECK: AORTIC ARCH AND ARCH VESSELS: No dissection or arterial injury. No significant stenosis of the brachiocephalic or subclavian arteries. CERVICAL CAROTID ARTERIES: No dissection, arterial injury, or hemodynamically significant stenosis by NASCET criteria. CERVICAL VERTEBRAL ARTERIES: No dissection, arterial injury, or significant stenosis. LUNGS AND MEDIASTINUM: Unremarkable. SOFT TISSUES: Nodular goiter. BONES: No acute abnormality. CTA HEAD: ANTERIOR CIRCULATION: No significant stenosis of the internal carotid arteries. No significant stenosis of the anterior cerebral arteries. No significant stenosis of the middle cerebral arteries. No aneurysm. POSTERIOR CIRCULATION: Fetal type origin of the right posterior cerebral artery. No significant stenosis of the posterior cerebral arteries. No significant stenosis of the basilar artery. No significant stenosis of the vertebral arteries. No aneurysm. OTHER: No dural venous sinus thrombosis on this non-dedicated study. IMPRESSION: 1. No large vessel occlusion, hemodynamically significant stenosis, or aneurysm in the head or neck. 2. Fetal type origin of the right posterior cerebral artery. Electronically signed by: evalene coho 09/06/2023 05:01 PM EDT RP Workstation: [id number]   MR Brain W and Wo Contrast Result Date: 09/06/2023 CLINICAL DATA:  Facial droop and left arm weakness EXAM: MRI HEAD WITHOUT AND WITH CONTRAST TECHNIQUE: Multiplanar, multiecho pulse sequences of the brain and surrounding  structures were obtained without and with intravenous contrast. CONTRAST:  8mL GADAVIST  GADOBUTROL  1 MMOL/ML IV SOLN COMPARISON:  CT September 06, 2023 FINDINGS: MRI brain: The brain volume is normal. The signal in the brain parenchyma is normal. There is no acute or chronic infarct. The ventricles are normal. No mass lesion. There are normal flow signals in the carotid arteries and basilar artery. No significant bone marrow signal abnormality. No significant abnormality in the paranasal sinuses or soft tissues. IMPRESSION: Normal Electronically Signed   By: Nancyann Burns M.D.   On: 09/06/2023 14:33   CT Head Wo Contrast Result Date: 09/06/2023 CLINICAL DATA:  Neuro deficit, acute, stroke suspected. Left-sided facial, arm, and leg numbness. Facial droop. Dizziness and slurred speech. EXAM: CT HEAD WITHOUT CONTRAST TECHNIQUE: Contiguous axial images were obtained from the base of the skull through the vertex without intravenous contrast. RADIATION DOSE REDUCTION: This exam was performed according to the departmental dose-optimization program which includes automated exposure control, adjustment of the mA and/or kV according to patient size and/or use of iterative reconstruction technique. COMPARISON:  CT maxillofacial 09/26/2011 FINDINGS: Brain: There is no evidence of an acute infarct, intracranial hemorrhage, mass, midline shift, or extra-axial fluid collection. Cerebral volume is normal. The ventricles are normal in size and configuration. Vascular: No hyperdense vessel. Skull: No fracture or suspicious lesion. Sinuses/Orbits: Visualized paranasal sinuses and mastoid air cells are clear. Unremarkable orbits. Other: None. IMPRESSION: Negative head CT. Electronically Signed   By: Dasie  Derrill M.D.   On: 09/06/2023 11:10    DISCHARGE EXAMINATION: Vitals:   09/07/23 0153 09/07/23 0400 09/07/23 0718 09/07/23 0830  BP:  117/82 114/82   Pulse:  92 (!) 108 80  Resp:  18 18 18   Temp: 97.6 F (36.4 C) 98.3 F (36.8  C) 98.4 F (36.9 C)   TempSrc:  Oral    SpO2:  99% 100% 94%  Weight:      Height:       General appearance: Awake alert.  In no distress Resp: Clear to auscultation bilaterally.  Normal effort Cardio: S1-S2 is normal regular.  No S3-S4.  No rubs murmurs or bruit GI: Abdomen is soft.  Nontender nondistended.  Bowel sounds are present normal.  No masses organomegaly Extremities: No edema.  Full range of motion of lower extremities. Neurologic: Left Facial droop noted.  Motor strength is equal bilateral upper and lower extremities.   DISPOSITION: Home  Discharge Instructions     Call MD for:  difficulty breathing, headache or visual disturbances   Complete by: As directed    Call MD for:  extreme fatigue   Complete by: As directed    Call MD for:  persistant dizziness or light-headedness   Complete by: As directed    Call MD for:  persistant nausea and vomiting   Complete by: As directed    Call MD for:  severe uncontrolled pain   Complete by: As directed    Call MD for:  temperature >100.4   Complete by: As directed    Diet - low sodium heart healthy   Complete by: As directed    Discharge instructions   Complete by: As directed    Please be sure to follow-up with your primary care provider early next week.  Take your medications as prescribed.  Seek attention if symptoms worsen.  You were cared for by a hospitalist during your hospital stay. If you have any questions about your discharge medications or the care you received while you were in the hospital after you are discharged, you can call the unit and asked to speak with the hospitalist on call if the hospitalist that took care of you is not available. Once you are discharged, your primary care physician will handle any further medical issues. Please note that NO REFILLS for any discharge medications will be authorized once you are discharged, as it is imperative that you return to your primary care physician (or establish a  relationship with a primary care physician if you do not have one) for your aftercare needs so that they can reassess your need for medications and monitor your lab values. If you do not have a primary care physician, you can call [phone] for a physician referral.   Increase activity slowly   Complete by: As directed         Allergies as of 09/07/2023       Reactions   Adhesive [tape] Swelling, Dermatitis, Other (See Comments)   Per the patient, only the 12 lead ekg pads cause welts, blisters and abrasions -ALSO, SOME BAND-AIDS, as well- OK TO USE PAPER TAPE   Alpha-gal Anaphylaxis, Hives, Swelling   Other Anaphylaxis, Nausea And Vomiting, Other (See Comments)   RED MEAT-ANAPHYLAXIS General Anesthesia - nausea and vomiting (scopolamine  patch doesn't work)   Metformin And Related Other (See Comments)   GI upset    Bactrim  [sulfamethoxazole -trimethoprim ] Rash   Vancomycin  Hives, Itching, Dermatitis        Medication List  STOP taking these medications    fluconazole  150 MG tablet Commonly known as: DIFLUCAN        TAKE these medications    Airsupra 90-80 MCG/ACT Aero Generic drug: Albuterol -Budesonide Inhale 2 puffs into the lungs See admin instructions. Inhale 2 puffs into the lungs six times a day as needed for the prevention of bronchoconstriction and/or to reduce the risk of asthma exacerbations   albuterol  108 (90 Base) MCG/ACT inhaler Commonly known as: VENTOLIN  HFA Inhale 2 puffs into the lungs every 6 (six) hours as needed for wheezing or shortness of breath.   Aleve 220 MG tablet Generic drug: naproxen sodium Take 220-440 mg by mouth 2 (two) times daily as needed (for pain).   Claritin 10 MG tablet Generic drug: loratadine Take 10 mg by mouth daily as needed for allergies or rhinitis.   cyclobenzaprine  5 MG tablet Commonly known as: FLEXERIL  Take 5 mg by mouth daily as needed for muscle spasms.   famotidine  20 MG tablet Commonly known as:  PEPCID  TAKE 1 TABLET BY MOUTH TWICE A DAY What changed:  when to take this reasons to take this   Flonase  Allergy Relief 50 MCG/ACT nasal spray Generic drug: fluticasone  Place 1 spray into both nostrils 2 (two) times daily as needed for allergies or rhinitis.   Gemtesa  75 MG Tabs Generic drug: Vibegron  Take 1 tablet (75 mg total) by mouth daily.   oxyCODONE  5 MG immediate release tablet Commonly known as: Oxy IR/ROXICODONE  Take 1 tablet (5 mg total) by mouth every 4 (four) hours as needed for severe pain (pain score 7-10).   predniSONE  20 MG tablet Commonly known as: DELTASONE  Take 3 tablets once daily for 5 days and then take 2 tablets daily till seen by PCP   valACYclovir  1000 MG tablet Commonly known as: VALTREX  Take 1 tablet (1,000 mg total) by mouth 3 (three) times daily for 10 days.          Follow-up Information     Rilla Baller, MD. Schedule an appointment as soon as possible for a visit in 3 day(s).   Specialty: Family Medicine Why: post hospitalization follow up Contact information: [address] [place] [address] [phone]                 TOTAL DISCHARGE TIME: 35 mins  Treylon Henard Verdene  [facility] Pager on [email or web address]  09/07/2023, 12:26 PM

## 2023-09-07 NOTE — ED Notes (Signed)
 Pt up for discharge vitals not obtained pt advised she was ready to leave.

## 2023-09-07 NOTE — Telephone Encounter (Signed)
 Admitted r/o stroke, reassuring imaging.

## 2023-09-08 ENCOUNTER — Ambulatory Visit
Admission: RE | Admit: 2023-09-08 | Discharge: 2023-09-08 | Disposition: A | Discharge status: Home / Self Care | Source: Ambulatory Visit

## 2023-09-08 ENCOUNTER — Encounter: Payer: Self-pay | Admitting: Family Medicine

## 2023-09-08 DIAGNOSIS — E042 Nontoxic multinodular goiter: Secondary | ICD-10-CM

## 2023-09-09 ENCOUNTER — Encounter: Payer: Self-pay | Admitting: Family Medicine

## 2023-09-09 ENCOUNTER — Ambulatory Visit (INDEPENDENT_AMBULATORY_CARE_PROVIDER_SITE_OTHER): Admitting: Family Medicine

## 2023-09-09 ENCOUNTER — Telehealth: Payer: Self-pay

## 2023-09-09 VITALS — BP 122/84 | HR 102 | Temp 98.8°F | Ht 61.0 in | Wt 192.1 lb

## 2023-09-09 DIAGNOSIS — R051 Acute cough: Secondary | ICD-10-CM

## 2023-09-09 DIAGNOSIS — R29898 Other symptoms and signs involving the musculoskeletal system: Secondary | ICD-10-CM

## 2023-09-09 DIAGNOSIS — G510 Bell's palsy: Secondary | ICD-10-CM | POA: Diagnosis not present

## 2023-09-09 DIAGNOSIS — J4540 Moderate persistent asthma, uncomplicated: Secondary | ICD-10-CM

## 2023-09-09 DIAGNOSIS — R202 Paresthesia of skin: Secondary | ICD-10-CM | POA: Diagnosis not present

## 2023-09-09 DIAGNOSIS — E0590 Thyrotoxicosis, unspecified without thyrotoxic crisis or storm: Secondary | ICD-10-CM | POA: Diagnosis not present

## 2023-09-09 LAB — T4, FREE: Free T4: 0.88 ng/dL (ref 0.60–1.60)

## 2023-09-09 LAB — VITAMIN B12: Vitamin B-12: 209 pg/mL — ABNORMAL LOW (ref 211–911)

## 2023-09-09 LAB — FOLATE: Folate: 11 ng/mL (ref >5.9)

## 2023-09-09 LAB — BASIC METABOLIC PANEL WITH GFR
BUN: 10 mg/dL (ref 6–23)
CO2: 27 meq/L (ref 19–32)
Calcium: 8.9 mg/dL (ref 8.4–10.5)
Chloride: 104 meq/L (ref 96–112)
Creatinine, Ser: 0.76 mg/dL (ref 0.40–1.20)
GFR: 96.05 mL/min (ref >60.00)
Glucose, Bld: 87 mg/dL (ref 70–99)
Potassium: 3.6 meq/L (ref 3.5–5.1)
Sodium: 141 meq/L (ref 135–145)

## 2023-09-09 LAB — TSH: TSH: 0.65 u[IU]/mL (ref 0.35–5.50)

## 2023-09-09 LAB — SEDIMENTATION RATE: Sed Rate: 2 mm/h (ref 0–20)

## 2023-09-09 MED ORDER — GUAIFENESIN-CODEINE 100-10 MG/5ML PO SOLN: 5.0000 mL | Freq: Two times a day (BID) | ORAL | 0 refills | Status: DC | PRN

## 2023-09-09 MED ORDER — GABAPENTIN 300 MG PO CAPS: 300.0000 mg | ORAL_CAPSULE | Freq: Every day | ORAL | 3 refills | Status: DC

## 2023-09-09 NOTE — Telephone Encounter (Addendum)
 I sent in gabapentin  and cough syrup to her pharmacy. Update us  with effect.

## 2023-09-09 NOTE — Telephone Encounter (Signed)
 Called patient reviewed all information and repeated back to me. Will call if any questions. Pt also wanted to make you aware FMLA has been extended to Sept 13th (due to scheduling).

## 2023-09-09 NOTE — Telephone Encounter (Signed)
 Copied from CRM #[id number]. Topic: Clinical - Prescription Issue >> Sep 09, 2023  1:29 PM Berneda FALCON wrote: Reason for CRM: Patient states that she was supposed to have 2 medications called in today but did not get them called in for her.  This would be gabapentin  and a cough medication.   [id number] [address] [place], [place] - [address] [place] [address] Phone: [phone] Fax: [phone] Hours: Not open 24 hours  She would also like to know when the FMLA is completed. And that we need to scan it and email it to the email on the documents and fax it to the number on the documents as well.  Patient callback is [phone]

## 2023-09-09 NOTE — Patient Instructions (Addendum)
 Labs today Continue prednisone  taper for now, I will send in continued taper to take when you finish current dose. Finish valtrex  course Keep endocrinology appointment Pending labs may refer you to neurology for evaluation of left arm/hand symptoms

## 2023-09-09 NOTE — Telephone Encounter (Signed)
 Noted

## 2023-09-09 NOTE — Addendum Note (Signed)
 Addended by: RILLA BALLER on: 09/09/2023 02:06 PM   Modules accepted: Orders

## 2023-09-09 NOTE — Progress Notes (Signed)
 Ph: [phone] Fax: [phone]   Patient ID: Damien GORMAN Custard, female    DOB: March 04, 1980, 43 y.o.   MRN: [id number]  This visit was conducted in person.  BP 122/84   Pulse (!) 102   Temp 98.8 F (37.1 C) (Oral)   Ht 5' 1 (1.549 m)   Wt 192 lb 2 oz (87.1 kg)   LMP 09/29/2020 (Approximate) Comment: PCOS  SpO2 93%   BMI 36.30 kg/m    CC: hosp f/u visit  Subjective:   HPI: LUCEAL HOLLIBAUGH is a 43 y.o. female presenting on 09/09/2023 for Hospitalization Follow-up (Seen on 09/03/23 at [facility] ED, dx acute cystitis w/hematuria; nausea/vomiting. Then seen on 09/06/23 at [facility] ED, dx L arm/leg numbness/tingling; Bell's palsy; facial droop. )   Marieanne  has a past medical history of Anemia, Asthma, Cancer (HCC) (2006), CIN III (cervical intraepithelial neoplasia III), Dysuria, Family history of adverse reaction to anesthesia, GERD (gastroesophageal reflux disease), Hematuria, HH (hiatus hernia), History of cervical dysplasia (2006), History of kidney stones, Migraines, Nephrolithiasis, uric acid, PCOS (polycystic ovarian syndrome), PONV (postoperative nausea and vomiting), Right ureteral calculus, Sleep apnea, and Urinary incontinence without sensory awareness.   Brings STD forms which will be filled out.   Initially seen 08/21/2023 at [facility] with viral pharyngitis. Saw [facility] Dr Burnie and Elyse, dx tonsillitis associated with nausea/vomiting, anorexia and weight loss. She was treated with prednisone  course, penicillin, and doxycycline  course. Incidentally noted multinodular thyroid  on neck CT s/p thyroid  US  (yesterday) - upcoming [facility] appt on Tuesday. Also TSH was low (0.192 09/03/2023).  Has had persistent cough since all of this.   Seen last Saturday 09/03/2023 at [facility] dx acute cystitis with hematuria nausea and vomiting treated with keflex  7d course. Returned to [place] on 09/06/2023 with L sided facial and arm numbness. R/o stroke with head CT< brain  MRI, and head/neck CTA - overall reassuring - diagnosed with Bell's palsy treated with prednisone  60mg  x5d followed by 40mg  - currently on 60mg  - as well as valacyclovir  1000mg  TID.   Bells palsy symptoms started on 08/30/2023. Presented with L facial numbness/weakness, L ear pain.   Notes persistent fatigue, L ear pain, headache, trouble closing left eyes and swallowing difficulty/drooling, notes ongoing left hand numbness with paresthesias and weakness affecting grip strength.  Requests to stay out of work until left hand strength is improved.  No neck pain symptoms.   Planning to start acupuncture on Tuesday.   Home health not set up.  Other follow up appointments scheduled: endo on Tuesday - see above.  ______________________________________________________________________ Hospital admission: 09/06/2023 Hospital discharge: 09/07/2023 TCM f/u phone call: not performed   RECOMMENDATIONS FOR OUTPATIENT FOLLOW UP: Patient recommended to follow-up with her outpatient providers early next week.      Relevant past medical, surgical, family and social history reviewed and updated as indicated. Interim medical history since our last visit reviewed. Allergies and medications reviewed and updated. Outpatient Medications Prior to Visit  Medication Sig Dispense Refill   AIRSUPRA 90-80 MCG/ACT AERO Inhale 2 puffs into the lungs See admin instructions. Inhale 2 puffs into the lungs six times a day as needed for the prevention of bronchoconstriction and/or to reduce the risk of asthma exacerbations     albuterol  (VENTOLIN  HFA) 108 (90 Base) MCG/ACT inhaler Inhale 2 puffs into the lungs every 6 (six) hours as needed for wheezing or shortness of breath. 8 g 2  ALEVE 220 MG tablet Take 220-440 mg by mouth 2 (two) times daily as needed (for pain).     CLARITIN 10 MG tablet Take 10 mg by mouth daily as needed for allergies or rhinitis.     cyclobenzaprine  (FLEXERIL ) 5 MG tablet Take 5 mg by mouth daily as  needed for muscle spasms.     famotidine  (PEPCID ) 20 MG tablet TAKE 1 TABLET BY MOUTH TWICE A DAY (Patient taking differently: Take 20 mg by mouth 2 (two) times daily as needed for heartburn or indigestion. As needed) 180 tablet 1   FLONASE  ALLERGY RELIEF 50 MCG/ACT nasal spray Place 1 spray into both nostrils 2 (two) times daily as needed for allergies or rhinitis.     oxyCODONE  (OXY IR/ROXICODONE ) 5 MG immediate release tablet Take 1 tablet (5 mg total) by mouth every 4 (four) hours as needed for severe pain (pain score 7-10). 10 tablet 0   predniSONE  (DELTASONE ) 20 MG tablet Take 3 tablets once daily for 5 days and then take 2 tablets daily till seen by PCP 60 tablet 0   valACYclovir  (VALTREX ) 1000 MG tablet Take 1 tablet (1,000 mg total) by mouth 3 (three) times daily for 10 days. 30 tablet 0   Vibegron  (GEMTESA ) 75 MG TABS Take 1 tablet (75 mg total) by mouth daily. (Patient not taking: Reported on 09/06/2023)     No facility-administered medications prior to visit.     Per HPI unless specifically indicated in ROS section below Review of Systems  Objective:  BP 122/84   Pulse (!) 102   Temp 98.8 F (37.1 C) (Oral)   Ht 5' 1 (1.549 m)   Wt 192 lb 2 oz (87.1 kg)   LMP 09/29/2020 (Approximate) Comment: PCOS  SpO2 93%   BMI 36.30 kg/m   Wt Readings from Last 3 Encounters:  09/09/23 192 lb 2 oz (87.1 kg)  09/06/23 189 lb (85.7 kg)  03/21/23 203 lb (92.1 kg)      Physical Exam Vitals and nursing note reviewed.  Constitutional:      Appearance: Normal appearance. She is not ill-appearing.  HENT:     Head: Normocephalic and atraumatic.     Right Ear: Tympanic membrane, ear canal and external ear normal. There is no impacted cerumen.     Left Ear: Tympanic membrane, ear canal and external ear normal. There is no impacted cerumen.     Nose: Nose normal.     Mouth/Throat:     Mouth: Mucous membranes are moist.     Pharynx: Oropharynx is clear. No oropharyngeal exudate or posterior  oropharyngeal erythema.     Comments: Small erythematous pustule to R posterior soft palate  Eyes:     Extraocular Movements: Extraocular movements intact.     Conjunctiva/sclera: Conjunctivae normal.     Pupils: Pupils are equal, round, and reactive to light.  Neck:     Thyroid : Thyroid  mass and thyromegaly present. No thyroid  tenderness.  Cardiovascular:     Rate and Rhythm: Normal rate and regular rhythm.     Pulses: Normal pulses.     Heart sounds: Normal heart sounds. No murmur heard. Pulmonary:     Effort: Pulmonary effort is normal. No respiratory distress.     Breath sounds: Normal breath sounds. No wheezing, rhonchi or rales.  Musculoskeletal:     Cervical back: Normal range of motion and neck supple.     Right lower leg: No edema.     Left lower leg: No edema.  Skin:    General: Skin is warm and dry.     Findings: No erythema or rash.  Neurological:     Mental Status: She is alert.     Cranial Nerves: Cranial nerve deficit and facial asymmetry present.     Sensory: Sensory deficit present.     Motor: Weakness present. No tremor or atrophy.     Coordination: Coordination is intact.     Gait: Gait is intact.     Comments:  L sided facial weakness present L hand grip weakness noted, o/w 5/5 BUE and BLE strength testing Diminished sensation to light touch of left hand into dorsal/ventral forearm, thenar eminence spared Pos tinel/phalen on left  Psychiatric:        Mood and Affect: Mood normal.        Behavior: Behavior normal.       Results for orders placed or performed in visit on 09/09/23  Basic metabolic panel with GFR   Collection Time: 09/09/23 12:28 PM  Result Value Ref Range   Sodium 141 135 - 145 mEq/L   Potassium 3.6 3.5 - 5.1 mEq/L   Chloride 104 96 - 112 mEq/L   CO2 27 19 - 32 mEq/L   Glucose, Bld 87 70 - 99 mg/dL   BUN 10 6 - 23 mg/dL   Creatinine, Ser 9.23 0.40 - 1.20 mg/dL   GFR 03.94 >39.99 mL/min   Calcium 8.9 8.4 - 10.5 mg/dL  TSH    Collection Time: 09/09/23 12:28 PM  Result Value Ref Range   TSH 0.65 0.35 - 5.50 uIU/mL  T4, free   Collection Time: 09/09/23 12:28 PM  Result Value Ref Range   Free T4 0.88 0.60 - 1.60 ng/dL  T3   Collection Time: 09/09/23 12:28 PM  Result Value Ref Range   T3, Total 90 76 - 181 ng/dL  Vitamin B12   Collection Time: 09/09/23 12:28 PM  Result Value Ref Range   Vitamin B-12 209 (L) 211 - 911 pg/mL  Folate   Collection Time: 09/09/23 12:28 PM  Result Value Ref Range   Folate 11.0 >5.9 ng/mL  Sedimentation rate   Collection Time: 09/09/23 12:28 PM  Result Value Ref Range   Sed Rate 2 0 - 20 mm/hr   Lab Results  Component Value Date   VD25OH 11.79 (L) 05/17/2022    Assessment & Plan:  I personally spent a total of 50 minutes in the care of the patient today including preparing to see the patient, getting/reviewing separately obtained history, performing a medically appropriate exam/evaluation, counseling and educating, placing orders, documenting clinical information in the EHR, communicating results, coordinating care, and filling out short term disability paperwork.  Problem List Items Addressed This Visit     Asthma, moderate persistent, poorly-controlled   H/o this, no wheezing or other signs of current exacerbation Continue airsupra use.       Acute cough   Ongoing cough after recent treatment for tonsillitis followed by sinusitis with doxycycline  and UTI treated with keflex .  Suspect residual post-infectious cough.  ERx codeine  cough syrup predominantly for night time use.  Update if ongoing or worsening symptoms.       Left-sided Bell palsy - Primary   S/p ER/hospital evaluation negative for stroke - reassuring head CT, brain MRI, and head/neck CTA.  Did have neurology consult in hospital.  Treated for L sided bell's palsy with prolonged prednisone  taper (60mg  x5d, followed by 40mg  - with further taper per our recommendations today, #60  20mg  prednisone  tablets  provided) and valtrex  1000mg  TID x10d. Exam today consistent with House-Brackmann score of V.  Discussed implementing eye safety measures as she endorses possible incomplete closure at night.  She notes ongoing left ear pain, with reassuring ear exam today. ?neuralgia component - will Rx gabapentin  300mg  nightly - she states she's previously tolerated gabapentin  well.       Relevant Orders   Basic metabolic panel with GFR (Completed)   TSH (Completed)   T4, free (Completed)   T3 (Completed)   Vitamin B12 (Completed)   Folate (Completed)   Sedimentation rate (Completed)   ANA   Serum protein electrophoresis with reflex   Paresthesia   Predominantly to left hand/forearm associated with left hand/arm weakness, not related to Bell's Palsy.  Numbness /paresthesias to left hand and forearm not in typical median nerve distribution.  No neck pain pointing against cervical radiculopathy. Does have positive tinel and phalen - ?carpal tunnel syndrome causing median nerve compression.  Will update labs including B12, folate, ANA, ESR.  Will fill out FMLA /STD forms for patient.  Consider EMG/NCS for further eval.       Relevant Orders   Vitamin B12 (Completed)   Folate (Completed)   Sedimentation rate (Completed)   ANA   Serum protein electrophoresis with reflex   Hyperthyroidism   Recent TSH 0.12 (09/03/2023)  Recent Thyroid  ultrasound - radiology read still pending Upcoming endo appt next week with [facility] endocrinology - will update TFTs and forward results to [facility].       Relevant Orders   TSH (Completed)   T4, free (Completed)   T3 (Completed)   Left hand weakness   See above ?CTS vs other cause. Further evaluate with labs today.         No orders of the defined types were placed in this encounter.   Orders Placed This Encounter  Procedures   Basic metabolic panel with GFR   TSH   T4, free   T3   Vitamin B12   Folate   Sedimentation rate   ANA   Serum protein  electrophoresis with reflex    Patient Instructions  Labs today Continue prednisone  taper for now, I will send in continued taper to take when you finish current dose. Finish valtrex  course Keep endocrinology appointment Pending labs may refer you to neurology for evaluation of left arm/hand symptoms  Follow up plan: Return if symptoms worsen or fail to improve.  Anton Blas, MD

## 2023-09-10 ENCOUNTER — Encounter: Payer: Self-pay | Admitting: Family Medicine

## 2023-09-10 DIAGNOSIS — R202 Paresthesia of skin: Secondary | ICD-10-CM | POA: Insufficient documentation

## 2023-09-10 DIAGNOSIS — E0590 Thyrotoxicosis, unspecified without thyrotoxic crisis or storm: Secondary | ICD-10-CM | POA: Insufficient documentation

## 2023-09-10 DIAGNOSIS — G510 Bell's palsy: Secondary | ICD-10-CM | POA: Insufficient documentation

## 2023-09-10 DIAGNOSIS — R29898 Other symptoms and signs involving the musculoskeletal system: Secondary | ICD-10-CM | POA: Insufficient documentation

## 2023-09-10 NOTE — Assessment & Plan Note (Addendum)
 See above ?CTS vs other cause. Further evaluate with labs today.

## 2023-09-10 NOTE — Telephone Encounter (Signed)
 STD forms filled and in CMA box.

## 2023-09-10 NOTE — Assessment & Plan Note (Addendum)
 Recent TSH 0.12 (09/03/2023)  Recent Thyroid  ultrasound - radiology read still pending Upcoming endo appt next week with [facility] endocrinology - will update TFTs and forward results to [facility].

## 2023-09-10 NOTE — Assessment & Plan Note (Addendum)
 H/o this, no wheezing or other signs of current exacerbation Continue airsupra use.

## 2023-09-10 NOTE — Assessment & Plan Note (Signed)
 Ongoing cough after recent treatment for tonsillitis followed by sinusitis with doxycycline  and UTI treated with keflex .  Suspect residual post-infectious cough.  ERx codeine  cough syrup predominantly for night time use.  Update if ongoing or worsening symptoms.

## 2023-09-10 NOTE — Assessment & Plan Note (Addendum)
 Predominantly to left hand/forearm associated with left hand/arm weakness, not related to Bell's Palsy.  Numbness /paresthesias to left hand and forearm not in typical median nerve distribution.  No neck pain pointing against cervical radiculopathy. Does have positive tinel and phalen - ?carpal tunnel syndrome causing median nerve compression.  Will update labs including B12, folate, ANA, ESR.  Will fill out FMLA /STD forms for patient.  Consider EMG/NCS for further eval.

## 2023-09-10 NOTE — Assessment & Plan Note (Signed)
 S/p ER/hospital evaluation negative for stroke - reassuring head CT, brain MRI, and head/neck CTA.  Did have neurology consult in hospital.  Treated for L sided bell's palsy with prolonged prednisone  taper (60mg  x5d, followed by 40mg  - with further taper per our recommendations today, #60 20mg  prednisone  tablets provided) and valtrex  1000mg  TID x10d. Exam today consistent with House-Brackmann score of V.  Discussed implementing eye safety measures as she endorses possible incomplete closure at night.  She notes ongoing left ear pain, with reassuring ear exam today. ?neuralgia component - will Rx gabapentin  300mg  nightly - she states she's previously tolerated gabapentin  well.

## 2023-09-12 ENCOUNTER — Ambulatory Visit: Payer: Self-pay | Admitting: Family Medicine

## 2023-09-12 DIAGNOSIS — E538 Deficiency of other specified B group vitamins: Secondary | ICD-10-CM

## 2023-09-12 LAB — PROTEIN ELECTROPHORESIS, SERUM, WITH REFLEX
Albumin ELP: 4 g/dL (ref 3.8–4.8)
Alpha 1: 0.3 g/dL (ref 0.2–0.3)
Alpha 2: 0.7 g/dL (ref 0.5–0.9)
Beta 2: 0.4 g/dL (ref 0.2–0.5)
Beta Globulin: 0.4 g/dL (ref 0.4–0.6)
Gamma Globulin: 0.9 g/dL (ref 0.8–1.7)
Total Protein: 6.8 g/dL (ref 6.1–8.1)

## 2023-09-12 LAB — ANA: Anti Nuclear Antibody (ANA): POSITIVE — AB

## 2023-09-12 LAB — T3: T3, Total: 90 ng/dL (ref 76–181)

## 2023-09-12 LAB — ANTI-NUCLEAR AB-TITER (ANA TITER): ANA Titer 1: 1:40 {titer} — ABNORMAL HIGH

## 2023-09-12 MED ORDER — VITAMIN B-12 1000 MCG PO TABS
1000.0000 ug | ORAL_TABLET | Freq: Every day | ORAL | Status: DC
Start: 2023-09-12 — End: 2023-11-03

## 2023-09-12 NOTE — Telephone Encounter (Addendum)
 Received completed forms from provider.  Forms faxed to [facility] at [phone]  Patient will pick up a copy at the front desk.   Copy sent to scan.

## 2023-09-12 NOTE — Telephone Encounter (Signed)
Placed in your box 

## 2023-09-13 DIAGNOSIS — E538 Deficiency of other specified B group vitamins: Secondary | ICD-10-CM | POA: Insufficient documentation

## 2023-09-13 NOTE — Telephone Encounter (Signed)
 Please schedule weekly b12 shots x2 wks then monthly x6 months.

## 2023-09-13 NOTE — Telephone Encounter (Signed)
 Left message to return call to our office.

## 2023-09-15 ENCOUNTER — Ambulatory Visit (INDEPENDENT_AMBULATORY_CARE_PROVIDER_SITE_OTHER)

## 2023-09-15 DIAGNOSIS — E538 Deficiency of other specified B group vitamins: Secondary | ICD-10-CM

## 2023-09-15 MED ORDER — CYANOCOBALAMIN 1000 MCG/ML IJ SOLN
1000.0000 ug | Freq: Once | INTRAMUSCULAR | Status: AC
  Administered 2023-09-15: 1000 ug via INTRAMUSCULAR

## 2023-09-15 NOTE — Progress Notes (Signed)
 Per orders of Dr. Arlyss Solian, injection of B12 given by Andrez JAYSON Orchard in right deltoid. Patient tolerated injection well. Patient will make appointment for 1 week.

## 2023-09-22 ENCOUNTER — Ambulatory Visit (INDEPENDENT_AMBULATORY_CARE_PROVIDER_SITE_OTHER)

## 2023-09-22 DIAGNOSIS — E538 Deficiency of other specified B group vitamins: Secondary | ICD-10-CM | POA: Diagnosis not present

## 2023-09-22 MED ORDER — CYANOCOBALAMIN 1000 MCG/ML IJ SOLN
1000.0000 ug | Freq: Once | INTRAMUSCULAR | Status: AC
  Administered 2023-09-22: 1000 ug via INTRAMUSCULAR

## 2023-09-22 NOTE — Telephone Encounter (Signed)
 Received disability and leave healthcare provider statement form from patient.  Patient states her FMLA runs out in September, and she needs continuous leave from [phone] for upcoming appointments/testing due to a recent health diagnosis.  **Patient originally gave the forms to the Endocrinologist to complete, but was told her PCP would need to complete**  Forms have been placed in the providers box for review.

## 2023-09-22 NOTE — Progress Notes (Signed)
 Per orders ofDr Eustaquio Boyden who is out of office and Dr. Loleta Books is in office injection of vitamin b 12 given by Lewanda Rife in left deltoid. Patient tolerated injection well. Patient will make appointment for 1 month.

## 2023-09-23 ENCOUNTER — Telehealth: Payer: Self-pay

## 2023-09-23 NOTE — Telephone Encounter (Signed)
 Copied from CRM [id number]. Topic: General - Call Back - No Documentation >> Sep 22, 2023  2:27 PM Rea ORN wrote: Reason for CRM: Pt called to advise she will be dropping off FMLA paperwork. She needs an extension for FMLA due more testing from specialist and needs PCP to sign off on it.

## 2023-09-27 ENCOUNTER — Other Ambulatory Visit (HOSPITAL_COMMUNITY): Payer: Self-pay | Admitting: Surgery

## 2023-09-27 ENCOUNTER — Other Ambulatory Visit: Payer: Self-pay | Admitting: Surgery

## 2023-09-27 DIAGNOSIS — E042 Nontoxic multinodular goiter: Secondary | ICD-10-CM

## 2023-09-27 DIAGNOSIS — R1310 Dysphagia, unspecified: Secondary | ICD-10-CM

## 2023-09-28 NOTE — Telephone Encounter (Signed)
 Filled out and in Amy's box

## 2023-09-28 NOTE — Telephone Encounter (Signed)
 Completed FMLA forms received and faxed to [employer] at [phone]  Copy sent to scan  Patient notified via phone that a copy was ready to pick up at the front desk

## 2023-10-03 NOTE — Telephone Encounter (Signed)
 Additional form faxed placed in Erin's box

## 2023-10-10 ENCOUNTER — Ambulatory Visit (HOSPITAL_COMMUNITY)
Admission: RE | Admit: 2023-10-10 | Discharge: 2023-10-10 | Disposition: A | Discharge status: Home / Self Care | Source: Ambulatory Visit | Attending: Surgery | Admitting: Surgery

## 2023-10-10 DIAGNOSIS — R1310 Dysphagia, unspecified: Secondary | ICD-10-CM | POA: Diagnosis present

## 2023-10-11 ENCOUNTER — Ambulatory Visit: Payer: Self-pay | Admitting: Surgery

## 2023-10-11 NOTE — Progress Notes (Signed)
 Upper GI study is normal with no evidence of impingement on the esophagus by the thyroid  gland.  Await results of FNA biopsy of the thyroid  nodule.  [name], MD [facility] practice Office: [phone]

## 2023-10-12 ENCOUNTER — Ambulatory Visit
Admission: RE | Admit: 2023-10-12 | Discharge: 2023-10-12 | Disposition: A | Discharge status: Home / Self Care | Source: Ambulatory Visit | Attending: Surgery | Admitting: Surgery

## 2023-10-12 ENCOUNTER — Other Ambulatory Visit (HOSPITAL_COMMUNITY)
Admission: RE | Admit: 2023-10-12 | Discharge: 2023-10-12 | Disposition: A | Discharge status: Home / Self Care | Source: Ambulatory Visit | Attending: Surgery | Admitting: Surgery

## 2023-10-12 DIAGNOSIS — E041 Nontoxic single thyroid nodule: Secondary | ICD-10-CM | POA: Diagnosis present

## 2023-10-12 DIAGNOSIS — E042 Nontoxic multinodular goiter: Secondary | ICD-10-CM

## 2023-10-13 ENCOUNTER — Ambulatory Visit

## 2023-10-13 DIAGNOSIS — E538 Deficiency of other specified B group vitamins: Secondary | ICD-10-CM

## 2023-10-13 MED ORDER — CYANOCOBALAMIN 1000 MCG/ML IJ SOLN
1000.0000 ug | Freq: Once | INTRAMUSCULAR | Status: AC
  Administered 2023-10-13: 1000 ug via INTRAMUSCULAR

## 2023-10-13 NOTE — Progress Notes (Signed)
 Per orders of Dr. Arlyss Solian, injection of B-12 given by Delores No D in right deltoid. Patient tolerated injection well.

## 2023-10-14 ENCOUNTER — Ambulatory Visit
Admission: EM | Admit: 2023-10-14 | Discharge: 2023-10-14 | Disposition: A | Discharge status: Home / Self Care | Attending: Physician Assistant | Admitting: Physician Assistant

## 2023-10-14 ENCOUNTER — Telehealth: Payer: Self-pay

## 2023-10-14 ENCOUNTER — Encounter: Payer: Self-pay | Admitting: Emergency Medicine

## 2023-10-14 DIAGNOSIS — N3000 Acute cystitis without hematuria: Secondary | ICD-10-CM | POA: Diagnosis present

## 2023-10-14 LAB — POCT URINE DIPSTICK
Bilirubin, UA: NEGATIVE
Glucose, UA: NEGATIVE mg/dL
Ketones, POC UA: NEGATIVE mg/dL
Nitrite, UA: NEGATIVE
Protein Ur, POC: 30 mg/dL — AB
Spec Grav, UA: >=1.03 — AB (ref 1.010–1.025)
Urobilinogen, UA: 0.2 U/dL
pH, UA: 6.5 (ref 5.0–8.0)

## 2023-10-14 LAB — CYTOLOGY - NON PAP

## 2023-10-14 MED ORDER — NITROFURANTOIN MONOHYD MACRO 100 MG PO CAPS: 100.0000 mg | ORAL_CAPSULE | Freq: Two times a day (BID) | ORAL | 0 refills | Status: DC

## 2023-10-14 MED ORDER — PHENAZOPYRIDINE HCL 200 MG PO TABS: 200.0000 mg | ORAL_TABLET | Freq: Three times a day (TID) | ORAL | 0 refills | Status: DC

## 2023-10-14 NOTE — ED Provider Notes (Signed)
 [facility]    CSN: [id number] Arrival date & time: 10/14/23  1117      History   Chief Complaint Chief Complaint  Patient presents with   Dysuria   Flank Pain   Urinary Frequency    HPI Kayla Sexton is a 43 y.o. female.   Here concerned with Kidney stone x 3 days . Admits dysuria, frequency, urgency, abdominal pain.  Denies f/c, n/v/d, vaginal discharge, vaginal bleeding.  Last kidney stone 6 months ago.    Past Medical History:  Diagnosis Date   Anemia    Asthma    Cancer (HCC) 2006   CERVICAL    CIN III (cervical intraepithelial neoplasia III)    Dysuria    Family history of adverse reaction to anesthesia    DAD AND BROTHER AND MOM-N/V   GERD (gastroesophageal reflux disease)    OCC   Hematuria    HH (hiatus hernia)    History of cervical dysplasia 2006   CIN1 and CIN2   History of kidney stones    Migraines    Nephrolithiasis, uric acid    PCOS (polycystic ovarian syndrome)    Dr. Mat   PONV (postoperative nausea and vomiting)    HARD TO WAKE UP   Right ureteral calculus    Sleep apnea    DOES NOT USE CPAP   Urinary incontinence without sensory awareness     Patient Active Problem List   Diagnosis Date Noted   Vitamin B12 deficiency 09/13/2023   Left-sided Bell palsy 09/10/2023   Paresthesia 09/10/2023   Hyperthyroidism 09/10/2023   Left hand weakness 09/10/2023   Left sided numbness 09/06/2023   Acute cough 02/15/2023   Right ear pain 02/15/2023   Facial rash 02/15/2023   Dysphagia 12/29/2022   GERD (gastroesophageal reflux disease) 12/29/2022   Health maintenance examination 05/28/2022   Dyslipidemia 05/28/2022   Vitamin D  deficiency 05/28/2022   History of cervical cancer in adulthood 03/29/2022   Asthma, moderate persistent, poorly-controlled 09/09/2021   Allergic rhinitis 09/09/2021   Prediabetes 01/30/2020   PCOS (polycystic ovarian syndrome) 10/17/2019   History of nephrolithiasis 09/28/2019   OSA (obstructive  sleep apnea) 12/26/2017   Severe obesity (BMI 35.0-39.9) with comorbidity (HCC) 11/21/2017   ADD (attention deficit disorder) 03/17/2011    Past Surgical History:  Procedure Laterality Date   ABDOMINAL HYSTERECTOMY     total / laporscopic   CERVICAL BIOPSY  W/ LOOP ELECTRODE EXCISION  12-04-2004   dr mat  [facility]   COLONOSCOPY  03/2008   Dr. Dianna   COMBINED HYSTEROSCOPY DIAGNOSTIC / D&C  02-28-2012   dr mat   @ [facility]   CYSTOSCOPY W/ RETROGRADES Right 03/31/2018   Procedure: CYSTOSCOPY WITH RETROGRADE PYELOGRAM;  Surgeon: Francisca Redell BROCKS, MD;  Location: [facility];  Service: Urology;  Laterality: Right;   CYSTOSCOPY W/ RETROGRADES  11/11/2020   Procedure: CYSTOSCOPY WITH RETROGRADE PYELOGRAM;  Surgeon: Francisca Redell BROCKS, MD;  Location: [facility];  Service: Urology;;   CYSTOSCOPY WITH RETROGRADE PYELOGRAM, URETEROSCOPY AND STENT PLACEMENT Bilateral 07/21/2017   Procedure: CYSTOSCOPY WITH BILATERAL  RETROGRADE BILATERAL URETEROSCOPY AND STENTS  PLACEMENT;  Surgeon: Watt Rush, MD;  Location: [facility];  Service: Urology;  Laterality: Bilateral;   CYSTOSCOPY WITH RETROGRADE URETHROGRAM Left 01/04/2020   Procedure: CYSTOSCOPY WITH RETROGRADE URETHROGRAM;  Surgeon: Francisca Redell BROCKS, MD;  Location: [facility];  Service: Urology;  Laterality: Left;   CYSTOSCOPY/URETEROSCOPY/HOLMIUM LASER/STENT PLACEMENT Left 03/17/2018   Procedure: CYSTOSCOPY/URETEROSCOPY/HOLMIUM LASER/STENT PLACEMENT;  Surgeon: Francisca Redell BROCKS, MD;  Location: [facility];  Service: Urology;  Laterality: Left;   CYSTOSCOPY/URETEROSCOPY/HOLMIUM LASER/STENT PLACEMENT Right 03/31/2018   Procedure: CYSTOSCOPY/URETEROSCOPY/HOLMIUM LASER/STENT PLACEMENT;  Surgeon: Francisca Redell BROCKS, MD;  Location: [facility];  Service: Urology;  Laterality: Right;   CYSTOSCOPY/URETEROSCOPY/HOLMIUM LASER/STENT PLACEMENT Left 01/04/2020   Procedure: CYSTOSCOPY/URETEROSCOPY/HOLMIUM LASER;  Surgeon: Francisca Redell BROCKS, MD;  Location: [facility];   Service: Urology;  Laterality: Left;   CYSTOSCOPY/URETEROSCOPY/HOLMIUM LASER/STENT PLACEMENT Left 11/11/2020   Procedure: CYSTOSCOPY/URETEROSCOPY/HOLMIUM LASER;  Surgeon: Francisca Redell BROCKS, MD;  Location: [facility];  Service: Urology;  Laterality: Left;   CYSTOSCOPY/URETEROSCOPY/HOLMIUM LASER/STENT PLACEMENT Left 07/02/2022   Procedure: CYSTOSCOPY/URETEROSCOPY/HOLMIUM LASER FOR STONE;  Surgeon: Francisca Redell BROCKS, MD;  Location: [facility];  Service: Urology;  Laterality: Left;   CYSTOSCOPY/URETEROSCOPY/HOLMIUM LASER/STENT PLACEMENT Left 03/25/2023   Procedure: CYSTOSCOPY/URETEROSCOPY/HOLMIUM LASER;  Surgeon: Francisca Redell BROCKS, MD;  Location: [facility];  Service: Urology;  Laterality: Left;   DILATION AND CURETTAGE OF UTERUS     endoscopy  2010   Dr. Dyane   EXTRACORPOREAL SHOCK WAVE LITHOTRIPSY  08/2021   FINGER SURGERY Right    MIDDLE FINGER   FOOT SURGERY Left 2015   5th bunionectomy w/ 2nd digit w/ fusion , retained hardware w/ 5th toe   FRACTURE SURGERY     left shoulder   HOLMIUM LASER APPLICATION Bilateral 07/21/2017   Procedure: HOLMIUM LASER APPLICATION;  Surgeon: Watt Rush, MD;  Location: [facility];  Service: Urology;  Laterality: Bilateral;    OB History   No obstetric history on file.      Home Medications    Prior to Admission medications   Medication Sig Start Date End Date Taking? Authorizing Provider  ALEVE 220 MG tablet Take 220-440 mg by mouth 2 (two) times daily as needed (for pain).   Yes [provider]  CLARITIN 10 MG tablet Take 10 mg by mouth daily as needed for allergies or rhinitis.   Yes [provider]  cyclobenzaprine  (FLEXERIL ) 5 MG tablet Take 5 mg by mouth daily as needed for muscle spasms. 12/29/22  Yes [provider]  gabapentin  (NEURONTIN ) 300 MG capsule Take 1 capsule (300 mg total) by mouth at bedtime. 09/09/23  Yes Rilla Baller, MD  guaiFENesin -codeine  100-10 MG/5ML syrup Take 5 mLs by mouth 2 (two)  times daily as needed for cough (sedation precautions). 09/09/23  Yes Rilla Baller, MD  ibuprofen (ADVIL) 800 MG tablet Take 800 mg by mouth.   Yes [provider]  nitrofurantoin , macrocrystal-monohydrate, (MACROBID ) 100 MG capsule Take 1 capsule (100 mg total) by mouth 2 (two) times daily. 10/14/23  Yes Juleen Rush, PA-C  phenazopyridine  (PYRIDIUM ) 200 MG tablet Take 1 tablet (200 mg total) by mouth 3 (three) times daily. 10/14/23  Yes Juleen Rush, PA-C  AIRSUPRA 90-80 MCG/ACT AERO Inhale 2 puffs into the lungs See admin instructions. Inhale 2 puffs into the lungs six times a day as needed for the prevention of bronchoconstriction and/or to reduce the risk of asthma exacerbations    [provider]  albuterol  (VENTOLIN  HFA) 108 (90 Base) MCG/ACT inhaler Inhale 2 puffs into the lungs every 6 (six) hours as needed for wheezing or shortness of breath. 11/04/22   Gladis Elsie BROCKS, PA-C  cyanocobalamin  (VITAMIN B12) 1000 MCG tablet Take 1 tablet (1,000 mcg total) by mouth daily. Patient not taking: Reported on 10/14/2023 09/12/23   Rilla Baller, MD  famotidine  (PEPCID ) 20 MG tablet TAKE 1 TABLET BY MOUTH TWICE A DAY Patient  taking differently: Take 20 mg by mouth 2 (two) times daily as needed for heartburn or indigestion. As needed 01/23/23   Rilla Baller, MD  FLONASE  ALLERGY RELIEF 50 MCG/ACT nasal spray Place 1 spray into both nostrils 2 (two) times daily as needed for allergies or rhinitis.    [provider]  oxyCODONE  (OXY IR/ROXICODONE ) 5 MG immediate release tablet Take 1 tablet (5 mg total) by mouth every 4 (four) hours as needed for severe pain (pain score 7-10). Patient not taking: Reported on 10/14/2023 09/07/23   Verdene Purchase, MD  predniSONE  (DELTASONE ) 20 MG tablet Take 3 tablets once daily for 5 days and then take 2 tablets daily till seen by PCP Patient not taking: Reported on 10/14/2023 09/07/23   Verdene Purchase, MD  valACYclovir  (VALTREX )  1000 MG tablet TAKE 1 TABLET (1,000 MG TOTAL) BY MOUTH 3 TIMES A DAY FOR 10 DAYS Oral; Duration: 10 Days Patient not taking: Reported on 10/14/2023    [provider]    Family History Family History  Problem Relation Age of Onset   Healthy Mother    Healthy Father    Breast cancer Paternal Grandmother    Breast cancer Other        Maternal Great Aunt    Social History Social History   Tobacco Use   Smoking status: Never    Passive exposure: Never   Smokeless tobacco: Never  Vaping Use   Vaping status: Never Used  Substance Use Topics   Alcohol use: No   Drug use: No     Allergies   Adhesive [tape], Alpha-gal, Other, Vancomycin , Metformin and related, Bactrim  [sulfamethoxazole -trimethoprim ], and Silicone   Review of Systems Review of Systems  Constitutional:  Negative for chills, fatigue and fever.  Gastrointestinal:  Positive for abdominal pain. Negative for nausea and vomiting.  Genitourinary:  Positive for dysuria, frequency and urgency. Negative for decreased urine volume, difficulty urinating, flank pain, genital sores, hematuria, vaginal bleeding, vaginal discharge and vaginal pain.  Musculoskeletal:  Positive for back pain.  Skin:  Negative for rash and wound.  Allergic/Immunologic: Negative for environmental allergies and immunocompromised state.  Neurological:  Negative for headaches.  Hematological:  Negative for adenopathy. Does not bruise/bleed easily.  Psychiatric/Behavioral:  Negative for sleep disturbance.      Physical Exam Triage Vital Signs ED Triage Vitals  Encounter Vitals Group     BP 10/14/23 1156 (!) 142/96     Girls Systolic BP Percentile --      Girls Diastolic BP Percentile --      Boys Systolic BP Percentile --      Boys Diastolic BP Percentile --      Pulse Rate 10/14/23 1156 79     Resp 10/14/23 1156 14     Temp 10/14/23 1156 97.9 F (36.6 C)     Temp Source 10/14/23 1156 Oral     SpO2 10/14/23 1156 96 %     Weight --       Height --      Head Circumference --      Peak Flow --      Pain Score 10/14/23 1157 6     Pain Loc --      Pain Education --      Exclude from Growth Chart --    No data found.  Updated Vital Signs BP (!) 142/96 (BP Location: Left Arm)   Pulse 79   Temp 97.9 F (36.6 C) (Oral)   Resp 14  LMP 09/29/2020 (Approximate) Comment: PCOS  SpO2 96%   Visual Acuity Right Eye Distance:   Left Eye Distance:   Bilateral Distance:    Right Eye Near:   Left Eye Near:    Bilateral Near:     Physical Exam Vitals and nursing note reviewed.  Constitutional:      General: She is not in acute distress.    Appearance: Normal appearance. She is well-developed. She is not ill-appearing.  HENT:     Head: Normocephalic and atraumatic.     Nose: Nose normal.  Eyes:     General: No scleral icterus.    Extraocular Movements: Extraocular movements intact.     Conjunctiva/sclera: Conjunctivae normal.  Cardiovascular:     Rate and Rhythm: Normal rate and regular rhythm.     Pulses: Normal pulses.     Heart sounds: Normal heart sounds. No murmur heard. Pulmonary:     Effort: Pulmonary effort is normal. No respiratory distress.     Breath sounds: Normal breath sounds.  Abdominal:     General: Bowel sounds are normal.     Palpations: Abdomen is soft.     Tenderness: There is abdominal tenderness (suprapubic). There is no right CVA tenderness, left CVA tenderness or guarding.  Musculoskeletal:     Cervical back: Normal range of motion and neck supple. No rigidity.  Skin:    General: Skin is warm and dry.  Neurological:     General: No focal deficit present.     Mental Status: She is alert and oriented to person, place, and time.     Motor: No weakness.     Gait: Gait normal.  Psychiatric:        Mood and Affect: Mood normal.        Behavior: Behavior normal.      UC Treatments / Results  Labs (all labs ordered are listed, but only abnormal results are displayed) Labs Reviewed   POCT URINE DIPSTICK - Abnormal; Notable for the following components:      Result Value   Color, UA straw (*)    Spec Grav, UA >=1.030 (*)    Blood, UA trace-intact (*)    Protein Ur, POC =30 (*)    Leukocytes, UA Small (1+) (*)    All other components within normal limits  URINE CULTURE    EKG   Radiology    Procedures Procedures (including critical care time)  Medications Ordered in UC Medications - No data to display  Initial Impression / Assessment and Plan / UC Course  I have reviewed the triage vital signs and the nursing notes.  Pertinent labs & imaging results that were available during my care of the patient were reviewed by me and considered in my medical decision making (see chart for details).     Take medication as prescribed Drink plenty of water Urine sent for Cx, lab results in 2 - 5 days Return PRN Final Clinical Impressions(s) / UC Diagnoses   Final diagnoses:  Acute cystitis without hematuria   Discharge Instructions   None    ED Prescriptions     Medication Sig Dispense Auth. Provider   phenazopyridine  (PYRIDIUM ) 200 MG tablet Take 1 tablet (200 mg total) by mouth 3 (three) times daily. 6 tablet Juleen Rush, PA-C   nitrofurantoin , macrocrystal-monohydrate, (MACROBID ) 100 MG capsule Take 1 capsule (100 mg total) by mouth 2 (two) times daily. 14 capsule Juleen Rush, PA-C      PDMP not reviewed this encounter.  Juleen Rush, PA-C 10/14/23 1236

## 2023-10-14 NOTE — Telephone Encounter (Signed)
 Pt called in with c/o burning and side/flank pain with urination. Pt was informed clinic is closing in 30 mins and no appointments are available today. She was encouraged to seek urgent care today and that she could F/U with us  after. Pt understood.

## 2023-10-14 NOTE — ED Triage Notes (Signed)
 Pt reports dysuria, L flank pain, and frequency x3 days. Feels like I am passing shards of glass when urinating. Denies abdominal pain. Hx of kidney stones (usually affects L side per pt). AZO taken with mild relief.

## 2023-10-16 LAB — URINE CULTURE: Culture: 50000 — AB

## 2023-10-17 ENCOUNTER — Ambulatory Visit (HOSPITAL_COMMUNITY): Payer: Self-pay

## 2023-10-17 MED ORDER — CEPHALEXIN 500 MG PO CAPS: 500.0000 mg | ORAL_CAPSULE | Freq: Two times a day (BID) | ORAL | 0 refills | Status: AC

## 2023-10-18 ENCOUNTER — Ambulatory Visit: Admitting: Family Medicine

## 2023-10-19 ENCOUNTER — Ambulatory Visit: Payer: Self-pay | Admitting: Surgery

## 2023-10-19 NOTE — Progress Notes (Signed)
 Telephone call to patient to discuss the results of her diagnostic studies.  Upper GI series demonstrated no abnormality.  The thyroid  did not significantly impinge on the esophagus.  There was no sign of obstruction with swallowing of a 13 mm barium tablet.  Patient did undergo ultrasound-guided fine-needle aspiration biopsy of the left sided thyroid  nodule.  Cytopathology was benign.  Patient states that obstructive symptoms continue.  She has discomfort.  She is having dysphagia.  She believes her symptoms are becoming more severe.  She wishes to proceed with thyroid  surgery.  I have recommended proceeding with left thyroid  lobectomy for removal of the 3 nodules in the left thyroid  lobe.  The right thyroid  lobe appears normal on ultrasound examination and physical examination.  Hopefully the right thyroid  lobe function will be normal following surgery and the patient may avoid having to take thyroid  hormone supplementation.  We again discussed proceeding with left thyroid  lobectomy.  We discussed the size of the surgical incision.  We discussed the hospital stay.  We discussed her postoperative recovery and return to work and activities.  The patient understands and wishes to proceed.  I will enter orders and send them to the schedulers to contact the patient.  [name], MD [facility] Surgery Office: [phone]

## 2023-10-21 NOTE — Telephone Encounter (Signed)
 Copied from CRM [id number]. Topic: Medical Record Request - Other >> Oct 21, 2023  2:15 PM Suzen RAMAN wrote: Reason for CRM: Patient would like to inform provider that Short term disability extension paperwork has a deadline of 11/03/23.

## 2023-10-21 NOTE — Telephone Encounter (Signed)
 Pt asks to extend continuous leave through 11.12.25 as she is waiting to be scheduled for surgery.  She asks that the impending surgery be noted on her forms as to why the extended time is needed.  Forms have been placed in the providers box for review.

## 2023-10-24 ENCOUNTER — Telehealth: Payer: Self-pay | Admitting: Family Medicine

## 2023-10-24 NOTE — Telephone Encounter (Signed)
Filled and in my CMA box.

## 2023-10-24 NOTE — Telephone Encounter (Signed)
 Completed updated forms received and faxed to [phone]  Updated copy sent to scan  Pt. Notified via voicemail that forms have been faxed and an updated copy for her records is available to pick up at the front desk at her convenience.

## 2023-10-24 NOTE — Telephone Encounter (Signed)
 Copied from CRM #[id number]. Topic: General - Other >> Oct 24, 2023  1:05 PM Dedra B wrote: Reason for CRM: Pt called to follow up on FMLA paperwork. Informed pt that FMLA paperwork can take up to 7-10 business days. Pls call pt.

## 2023-10-24 NOTE — Telephone Encounter (Signed)
 I think I already did this? Thanks.

## 2023-10-26 ENCOUNTER — Encounter: Payer: Self-pay | Admitting: Family Medicine

## 2023-10-26 DIAGNOSIS — E041 Nontoxic single thyroid nodule: Secondary | ICD-10-CM | POA: Insufficient documentation

## 2023-10-27 ENCOUNTER — Encounter: Payer: Self-pay | Admitting: Family Medicine

## 2023-10-27 DIAGNOSIS — R202 Paresthesia of skin: Secondary | ICD-10-CM

## 2023-10-27 DIAGNOSIS — M2550 Pain in unspecified joint: Secondary | ICD-10-CM

## 2023-10-27 DIAGNOSIS — R7689 Other specified abnormal immunological findings in serum: Secondary | ICD-10-CM

## 2023-10-27 DIAGNOSIS — R29898 Other symptoms and signs involving the musculoskeletal system: Secondary | ICD-10-CM

## 2023-11-03 NOTE — Progress Notes (Addendum)
 Anesthesia Review:  PCP: Guitterrez LOV 09/09/23  Cardiologist : none   PPM/ ICD: Device Orders: Rep Notified:  Chest x-ray : EKG : 09/09/23  Echo : Stress test: Cardiac Cath :   Activity level: can do a flight of stairs without difficutly  Sleep Study/ CPAP : has cpap has not been able to use recently due to cough related to thyroid   Fasting Blood Sugar :      / Checks Blood Sugar -- times a day:    Blood Thinner/ Instructions /Last Dose: ASA / Instructions/ Last Dose :    10/14/23- Urgent Care - Cystitis   PT has an Invaslign Bracket on teeth.   PT has false eyelashes which are glued on and they have to be taken off by professional.    PT with hx of Bells palsy.   PT was a phone appt on 11/07/23.  Med hx and preop instructions completed .  PT oviced understanding.  At end of phone call pt asked to call Admitting at [phone] .  PT voiced understanding.

## 2023-11-03 NOTE — Patient Instructions (Signed)
 SURGICAL WAITING ROOM VISITATION  Patients having surgery or a procedure may have no more than 2 support people in the waiting area - these visitors may rotate.    Children under the age of 3 must have an adult with them who is not the patient.  Visitors with respiratory illnesses are discouraged from visiting and should remain at home.  If the patient needs to stay at the hospital during part of their recovery, the visitor guidelines for inpatient rooms apply. Pre-op nurse will coordinate an appropriate time for 1 support person to accompany patient in pre-op.  This support person may not rotate.    Please refer to the [facility] website for the visitor guidelines for Inpatients (after your surgery is over and you are in a regular room).       Your procedure is scheduled on:  11/10/2023    Report to [facility] Main Entrance    Report to admitting at  0900 AM   Call this number if you have problems the morning of surgery [phone]   Do not eat food :After Midnight.   After Midnight you may have the following liquids until ___ 0815___ AM  DAY OF SURGERY  Water Non-Citrus Juices (without pulp, NO RED-Apple, White grape, White cranberry) Black Coffee (NO MILK/CREAM OR CREAMERS, sugar ok)  Clear Tea (NO MILK/CREAM OR CREAMERS, sugar ok) regular and decaf                             Plain Jell-O (NO RED)                                           Fruit ices (not with fruit pulp, NO RED)                                     Popsicles (NO RED)                                                               Sports drinks like Gatorade (NO RED)                            If you have questions, please contact your surgeon's office.       Oral Hygiene is also important to reduce your risk of infection.                                    Remember - BRUSH YOUR TEETH THE MORNING OF SURGERY WITH YOUR REGULAR TOOTHPASTE  DENTURES WILL BE REMOVED PRIOR TO SURGERY PLEASE DO  NOT APPLY Poly grip OR ADHESIVES!!!   Do NOT smoke after Midnight   Stop all vitamins and herbal supplements 7 days before surgery.   Take these medicines the morning of surgery with A SIP OF WATER:   DO NOT TAKE ANY ORAL DIABETIC MEDICATIONS DAY OF YOUR SURGERY  Bring CPAP mask and tubing day of  surgery.                              You may not have any metal on your body including hair pins, jewelry, and body piercing             Do not wear make-up, lotions, powders, perfumes/cologne, or deodorant  Do not wear nail polish including gel and S&S, artificial/acrylic nails, or any other type of covering on natural nails including finger and toenails. If you have artificial nails, gel coating, etc. that needs to be removed by a nail salon please have this removed prior to surgery or surgery may need to be canceled/ delayed if the surgeon/ anesthesia feels like they are unable to be safely monitored.   Do not shave  48 hours prior to surgery.               Men may shave face and neck.   Do not bring valuables to the hospital. [facility] IS NOT             RESPONSIBLE   FOR VALUABLES.   Contacts, glasses, dentures or bridgework may not be worn into surgery.   Bring small overnight bag day of surgery.   DO NOT BRING YOUR HOME MEDICATIONS TO THE HOSPITAL. PHARMACY WILL DISPENSE MEDICATIONS LISTED ON YOUR MEDICATION LIST TO YOU DURING YOUR ADMISSION IN THE HOSPITAL!    Patients discharged on the day of surgery will not be allowed to drive home.  Someone NEEDS to stay with you for the first 24 hours after anesthesia.   Special Instructions: Bring a copy of your healthcare power of attorney and living will documents the day of surgery if you haven't scanned them before.              Please read over the following fact sheets you were given: IF YOU HAVE QUESTIONS ABOUT YOUR PRE-OP INSTRUCTIONS PLEASE CALL [phone].   If you received a COVID test during your pre-op visit  it is  requested that you wear a mask when out in public, stay away from anyone that may not be feeling well and notify your surgeon if you develop symptoms. If you test positive for Covid or have been in contact with anyone that has tested positive in the last 10 days please notify you surgeon.    [facility] - Preparing for Surgery Before surgery, you can play an important role.  Because skin is not sterile, your skin needs to be as free of germs as possible.  You can reduce the number of germs on your skin by washing with CHG (chlorahexidine gluconate) soap before surgery.  CHG is an antiseptic cleaner which kills germs and bonds with the skin to continue killing germs even after washing. Please DO NOT use if you have an allergy to CHG or antibacterial soaps.  If your skin becomes reddened/irritated stop using the CHG and inform your nurse when you arrive at Short Stay. Do not shave (including legs and underarms) for at least 48 hours prior to the first CHG shower.  You may shave your face/neck.  Please follow these instructions carefully:  1.  Shower with CHG Soap the night before surgery ONLY (DO NOT USE THE SOAP THE MORNING OF SURGERY).  2.  If you choose to wash your hair, wash your hair first as usual with your normal  shampoo.  3.  After you shampoo, rinse your hair  and body thoroughly to remove the shampoo.                             4.  Use CHG as you would any other liquid soap.  You can apply chg directly to the skin and wash.  Gently with a scrungie or clean washcloth.  5.  Apply the CHG Soap to your body ONLY FROM THE NECK DOWN.   Do   not use on face/ open                           Wound or open sores. Avoid contact with eyes, ears mouth and   genitals (private parts).                       Wash face,  Genitals (private parts) with your normal soap.             6.  Wash thoroughly, paying special attention to the area where your    surgery  will be performed.  7.  Thoroughly rinse your body  with warm water from the neck down.  8.  DO NOT shower/wash with your normal soap after using and rinsing off the CHG Soap.                9.  Pat yourself dry with a clean towel.            10.  Wear clean pajamas.            11.  Place clean sheets on your bed the night of your first shower and do not  sleep with pets. Day of Surgery : Do not apply any CHG, lotions/deodorants the morning of surgery.  Please wear clean clothes to the hospital/surgery center.  FAILURE TO FOLLOW THESE INSTRUCTIONS MAY RESULT IN THE CANCELLATION OF YOUR SURGERY  PATIENT SIGNATURE_________________________________  NURSE SIGNATURE__________________________________  ________________________________________________________________________

## 2023-11-07 ENCOUNTER — Encounter (HOSPITAL_COMMUNITY)
Admission: RE | Admit: 2023-11-07 | Discharge: 2023-11-07 | Disposition: A | Discharge status: Home / Self Care | Source: Ambulatory Visit | Attending: Surgery | Admitting: Surgery

## 2023-11-07 ENCOUNTER — Other Ambulatory Visit: Payer: Self-pay

## 2023-11-07 ENCOUNTER — Encounter (HOSPITAL_COMMUNITY): Payer: Self-pay

## 2023-11-07 ENCOUNTER — Encounter (HOSPITAL_COMMUNITY): Payer: Self-pay | Admitting: Surgery

## 2023-11-07 VITALS — Ht 61.0 in | Wt 186.0 lb

## 2023-11-07 DIAGNOSIS — Z01818 Encounter for other preprocedural examination: Secondary | ICD-10-CM

## 2023-11-07 HISTORY — DX: Unspecified osteoarthritis, unspecified site: M19.90

## 2023-11-07 HISTORY — DX: Anxiety disorder, unspecified: F41.9

## 2023-11-07 NOTE — H&P (Signed)
 REFERRING PHYSICIAN: Rilla Baller, MD  PROVIDER: [name], MD   Chief Complaint: New Consultation (Multiple thyroid  nodules)  History of Present Illness:  Patient is referred by her primary care physician, Dr. Baller Kayla, and her endocrinologist, Kayla Clover, NP, for surgical evaluation and recommendations regarding multiple thyroid  nodules. Patient began having significant symptoms approximately 1 month ago. She has had problems over the past month with sore throat, difficulty swallowing, left ear pain, and facial asymmetry. She was diagnosed with Bell's palsy. He apparently underwent evaluation showing pharyngitis and a possible esophageal ulceration. Patient underwent CT scan which had an incidental finding of thyroid  nodules. Patient underwent an ultrasound 2 weeks ago which demonstrated a mildly enlarged left thyroid  lobe with multiple small thyroid  nodules on the left side. The largest and most significant nodule measured 1.3 cm, was solid and hypoechoic, and ultrasound follow-up was recommended in 1 year. TSH level was normal at 0.65. Patient has had no prior head or neck surgery. She has never been on thyroid  medication. 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  Review of Systems: A complete review of systems was obtained from the patient. I have reviewed this information and discussed as appropriate with the patient. See HPI as well for other ROS.  Review of Systems  Constitutional: Positive for weight loss.  HENT: Positive for ear pain and sore throat.  Eyes: Negative.  Respiratory: Positive for cough.  Cardiovascular: Negative.  Gastrointestinal: Negative.  Genitourinary: Negative.  Musculoskeletal: Positive for neck pain.  Skin: Negative.  Neurological: Negative.  Endo/Heme/Allergies: Negative.  Psychiatric/Behavioral: Negative.    Medical History: History reviewed. No  pertinent past medical history.  Patient Active Problem List  Diagnosis  Multiple thyroid  nodules  Dysphagia  Cough   History reviewed. No pertinent surgical history.   Allergies  Allergen Reactions  Vancomycin  Analogues Anaphylaxis, Dermatitis, Hives, Itching, Other (See Comments) and Rash  Adhesive Tape-Silicones Other (See Comments)  redness   Current Outpatient Medications on File Prior to Visit  Medication Sig Dispense Refill  ibuprofen (MOTRIN) 800 MG tablet Take 800 mg by mouth 3 (three) times daily   No current facility-administered medications on file prior to visit.   History reviewed. No pertinent family history.   Social History   Tobacco Use  Smoking Status Not on file  Smokeless Tobacco Not on file    Social History   Socioeconomic History  Marital status: Single   Social Drivers of Health   Housing Stability: Unknown (09/22/2023)  Housing Stability Vital Sign  Homeless in the Last Year: No   Objective:   Vitals:  BP: (!) 134/98  Resp: 18  SpO2: 98%  Weight: 85.8 kg (189 lb 3.2 oz)  Height: 154.9 cm (5' 1)  PainSc: 6  PainLoc: Throat   Body mass index is 35.75 kg/m.  Physical Exam   GENERAL APPEARANCE Comfortable, no acute issues Development: normal Gross deformities: none  SKIN Rash, lesions, ulcers: none Induration, erythema: none Nodules: none palpable  EYES Conjunctiva and lids: normal Pupils: equal  EARS, NOSE, MOUTH, THROAT External ears: no lesion or deformity External nose: no lesion or deformity Hearing: grossly normal  NECK Symmetric: yes Trachea: midline Thyroid : Right thyroid  lobe is without palpable abnormality. Thyroid  lobe has multiple relatively firm nodules which are mobile with swallowing and nontender. There is no associated lymphadenopathy.  CHEST/CV Not assessed  ABDOMEN Not assessed  GENITOURINARY/RECTAL Not assessed  MUSCULOSKELETAL Station and gait: normal Digits and nails: no clubbing  or cyanosis Muscle strength: grossly normal all extremities Deformity: none  LYMPHATIC Cervical: none palpable Supraclavicular: none palpable  PSYCHIATRIC Oriented to person, place, and time: yes Mood and affect: normal for situation Judgment and insight: appropriate for situation   Assessment and Plan:   Multiple thyroid  nodules Dysphagia, unspecified type Cough, unspecified type  Patient presents on referral from her primary care physician and endocrinologist for surgical evaluation of newly diagnosed multiple thyroid  nodules.  Patient provided with a copy of The Thyroid  Book: Medical and Surgical Treatment of Thyroid  Problems, published by [employer], 16 pages. Book reviewed and explained to patient during visit today.  They we reviewed her clinical history over the past 4 to 6 weeks. We reviewed the multiple symptoms she has been experiencing. Reviewed the results of her ultrasound as well as her laboratory studies. Most of her symptoms would be difficult to attribute to the thyroid  nodules identified in her left thyroid  lobe. Patient however has a high level of anxiety regarding these nodules and favors proceeding with thyroid  lobectomy for definitive diagnosis and management. We discussed options for management. I would like to obtain a modified barium swallow to see if there is any impingement on the esophagus or any other possible etiology for her discomfort and dysphagia. I would also like to asked radiology to go ahead and biopsy the most concerning nodule in the left thyroid  lobe to see if there is any sign of atypia or malignancy. We will plan to proceed with these 2 studies. Based on these results we will make a decision then regarding the possibility of surgical intervention. At this point there is no absolute indication for thyroid  surgery.  Patient understands. She is currently out on medical disability. I mean of further testing or possible surgery is of the essence. We will  make arrangements for these 2 studies and I will be back in touch with her regarding the results. We can then discuss options for further management.   Kayla Spinner, MD [facility] practice Office: [phone]

## 2023-11-08 NOTE — Anesthesia Preprocedure Evaluation (Signed)
 Anesthesia Evaluation  Patient identified by MRN, date of birth, ID band Patient awake    Reviewed: Allergy & Precautions, NPO status , Patient's Chart, lab work & pertinent test results  History of Anesthesia Complications (+) PONV and history of anesthetic complications  Airway Mallampati: II  TM Distance: >3 FB Neck ROM: Full    Dental no notable dental hx.    Pulmonary asthma , sleep apnea and Continuous Positive Airway Pressure Ventilation    Pulmonary exam normal        Cardiovascular  Rhythm:Regular Rate:Normal     Neuro/Psych  Headaches  Anxiety        GI/Hepatic Neg liver ROS, hiatal hernia,GERD  ,,  Endo/Other   Hyperthyroidism   Renal/GU negative Renal ROS  negative genitourinary   Musculoskeletal  (+) Arthritis , Osteoarthritis,    Abdominal Normal abdominal exam  (+)   Peds  Hematology  (+) Blood dyscrasia, anemia Lab Results      Component                Value               Date                      WBC                      8.2                 11/10/2023                HGB                      14.6                11/10/2023                HCT                      46.5 (H)            11/10/2023                MCV                      88.7                11/10/2023                PLT                      332                 11/10/2023              Anesthesia Other Findings   Reproductive/Obstetrics                              Anesthesia Physical Anesthesia Plan  ASA: 2  Anesthesia Plan: General   Post-op Pain Management: Tylenol  PO (pre-op)*   Induction: Intravenous  PONV Risk Score and Plan: 4 or greater and Ondansetron , Dexamethasone , Propofol  infusion, TIVA, Midazolam , Scopolamine  patch - Pre-op, Amisulpride and Treatment may vary due to age or medical condition  Airway Management Planned: Mask and Oral ETT  Additional Equipment: None  Intra-op Plan:    Post-operative Plan: Extubation in OR  Informed Consent: I have reviewed the patients History and Physical, chart, labs and discussed the procedure including the risks, benefits and alternatives for the proposed anesthesia with the patient or authorized representative who has indicated his/her understanding and acceptance.     Dental advisory given  Plan Discussed with: CRNA  Anesthesia Plan Comments: (See PAT note from 10/13 )         Anesthesia Quick Evaluation

## 2023-11-08 NOTE — Progress Notes (Signed)
 Case: [id number] Date/Time: 11/10/23 1100   Procedure: LOBECTOMY, THYROID  (Left)   Anesthesia type: General   Pre-op diagnosis: MULTIPLE THYROID  NODULES   Location: [facility] ROOM 04 / [facility]   Surgeons: Eletha Boas, MD       DISCUSSION: Kayla Sexton is a 43 yo female with PMH of asthma, OSA (uses CPAP), recent Bells palsy, GERD, hiatal hernia, migraines, anemia, arthritis, anxiety, kidney stones s/p multiple lithotripsy procedures  Prior anesthesia complications include PONV, prolonged emergence. Patient also has alpha-gal allergy.  Patient diagnosed with Bell's palsy on 09/06/23 when she presented with left face numbness and weakness. She also reported left arm and leg numbness and so stroke w/u was completed. MRI brain and CTA head/neck were negative.   Seen by PCP in follow up on 09/09/23. Asthma stable. Treated for cough due to recent tonsillitis. Also reporting ongoing paraesthesias in her arm of unclear etiology. Labs ordered.   Treated for UTI on 9/19 at Urgent care   VS: Ht 5' 1 (1.549 m)   Wt 84.4 kg   LMP 09/29/2020 (Approximate) Comment: PCOS  BMI 35.14 kg/m   PROVIDERS: Rilla Baller, MD   LABS: Labs reviewed: Acceptable for surgery. (all labs ordered are listed, but only abnormal results are displayed)  Labs Reviewed - No data to display   IMAGES:   EKG 09/06/23:  Sinus rhythm Borderline left axis deviation Consider anterior infarct   CV:  Past Medical History:  Diagnosis Date   Anemia    Anxiety    Arthritis    Asthma    Cancer (HCC) 2006   CERVICAL , stage 1 ovarian cancer   CIN III (cervical intraepithelial neoplasia III)    Dysuria    Family history of adverse reaction to anesthesia    DAD AND BROTHER AND MOM-N/V   GERD (gastroesophageal reflux disease)    OCC   Hematuria    HH (hiatus hernia)    History of cervical dysplasia 2006   CIN1 and CIN2   History of kidney stones    Migraines    PCOS (polycystic ovarian syndrome)    Dr.  Mat   PONV (postoperative nausea and vomiting)    HARD TO WAKE UP   Right ureteral calculus    Sleep apnea    DOES NOT USE CPAP   Urinary incontinence without sensory awareness     Past Surgical History:  Procedure Laterality Date   ABDOMINAL HYSTERECTOMY     total / laporscopic   CERVICAL BIOPSY  W/ LOOP ELECTRODE EXCISION  12-04-2004   dr mat  [facility]   COLONOSCOPY  03/2008   Dr. Dianna   COMBINED HYSTEROSCOPY DIAGNOSTIC / D&C  02-28-2012   dr mat   @ [facility]   CYSTOSCOPY W/ RETROGRADES Right 03/31/2018   Procedure: CYSTOSCOPY WITH RETROGRADE PYELOGRAM;  Surgeon: Francisca Redell BROCKS, MD;  Location: [facility];  Service: Urology;  Laterality: Right;   CYSTOSCOPY W/ RETROGRADES  11/11/2020   Procedure: CYSTOSCOPY WITH RETROGRADE PYELOGRAM;  Surgeon: Francisca Redell BROCKS, MD;  Location: [facility];  Service: Urology;;   CYSTOSCOPY WITH RETROGRADE PYELOGRAM, URETEROSCOPY AND STENT PLACEMENT Bilateral 07/21/2017   Procedure: CYSTOSCOPY WITH BILATERAL  RETROGRADE BILATERAL URETEROSCOPY AND STENTS  PLACEMENT;  Surgeon: Watt Rush, MD;  Location: [facility];  Service: Urology;  Laterality: Bilateral;   CYSTOSCOPY WITH RETROGRADE URETHROGRAM Left 01/04/2020   Procedure: CYSTOSCOPY WITH RETROGRADE URETHROGRAM;  Surgeon: Francisca Redell BROCKS, MD;  Location: [facility];  Service: Urology;  Laterality: Left;  CYSTOSCOPY/URETEROSCOPY/HOLMIUM LASER/STENT PLACEMENT Left 03/17/2018   Procedure: CYSTOSCOPY/URETEROSCOPY/HOLMIUM LASER/STENT PLACEMENT;  Surgeon: Francisca Redell BROCKS, MD;  Location: [facility];  Service: Urology;  Laterality: Left;   CYSTOSCOPY/URETEROSCOPY/HOLMIUM LASER/STENT PLACEMENT Right 03/31/2018   Procedure: CYSTOSCOPY/URETEROSCOPY/HOLMIUM LASER/STENT PLACEMENT;  Surgeon: Francisca Redell BROCKS, MD;  Location: [facility];  Service: Urology;  Laterality: Right;   CYSTOSCOPY/URETEROSCOPY/HOLMIUM LASER/STENT PLACEMENT Left 01/04/2020   Procedure: CYSTOSCOPY/URETEROSCOPY/HOLMIUM LASER;  Surgeon:  Francisca Redell BROCKS, MD;  Location: [facility];  Service: Urology;  Laterality: Left;   CYSTOSCOPY/URETEROSCOPY/HOLMIUM LASER/STENT PLACEMENT Left 11/11/2020   Procedure: CYSTOSCOPY/URETEROSCOPY/HOLMIUM LASER;  Surgeon: Francisca Redell BROCKS, MD;  Location: [facility];  Service: Urology;  Laterality: Left;   CYSTOSCOPY/URETEROSCOPY/HOLMIUM LASER/STENT PLACEMENT Left 07/02/2022   Procedure: CYSTOSCOPY/URETEROSCOPY/HOLMIUM LASER FOR STONE;  Surgeon: Francisca Redell BROCKS, MD;  Location: [facility];  Service: Urology;  Laterality: Left;   CYSTOSCOPY/URETEROSCOPY/HOLMIUM LASER/STENT PLACEMENT Left 03/25/2023   Procedure: CYSTOSCOPY/URETEROSCOPY/HOLMIUM LASER;  Surgeon: Francisca Redell BROCKS, MD;  Location: [facility];  Service: Urology;  Laterality: Left;   DILATION AND CURETTAGE OF UTERUS     endoscopy  2010   Dr. Dyane   EXTRACORPOREAL SHOCK WAVE LITHOTRIPSY  08/2021   FINGER SURGERY Right    MIDDLE FINGER   FOOT SURGERY Left 2015   5th bunionectomy w/ 2nd digit w/ fusion , retained hardware w/ 5th toe   FRACTURE SURGERY     left shoulder   HOLMIUM LASER APPLICATION Bilateral 07/21/2017   Procedure: HOLMIUM LASER APPLICATION;  Surgeon: Watt Rush, MD;  Location: [facility];  Service: Urology;  Laterality: Bilateral;    MEDICATIONS:  ALEVE 220 MG tablet   cyclobenzaprine  (FLEXERIL ) 5 MG tablet   famotidine  (PEPCID ) 20 MG tablet   gabapentin  (NEURONTIN ) 300 MG capsule   No current facility-administered medications for this encounter.    Kayla Sexton CHRISTELLA Odis DEVONNA [facility] Short Stay/Anesthesiology [facility] Phone [phone] 11/08/2023 10:15 AM

## 2023-11-10 ENCOUNTER — Encounter (HOSPITAL_COMMUNITY): Payer: Self-pay | Admitting: Surgery

## 2023-11-10 ENCOUNTER — Ambulatory Visit (HOSPITAL_COMMUNITY): Admitting: Physician Assistant

## 2023-11-10 ENCOUNTER — Ambulatory Visit (HOSPITAL_COMMUNITY)
Admission: RE | Admit: 2023-11-10 | Discharge: 2023-11-11 | Disposition: A | Discharge status: Home / Self Care | Attending: Surgery | Admitting: Surgery

## 2023-11-10 ENCOUNTER — Encounter (HOSPITAL_COMMUNITY)
Admission: RE | Disposition: A | Discharge status: Home / Self Care | Payer: Self-pay | Source: Home / Self Care | Attending: Surgery

## 2023-11-10 ENCOUNTER — Ambulatory Visit (HOSPITAL_COMMUNITY): Admitting: Registered Nurse

## 2023-11-10 ENCOUNTER — Other Ambulatory Visit: Payer: Self-pay

## 2023-11-10 DIAGNOSIS — Z01818 Encounter for other preprocedural examination: Secondary | ICD-10-CM

## 2023-11-10 DIAGNOSIS — J45909 Unspecified asthma, uncomplicated: Secondary | ICD-10-CM | POA: Diagnosis not present

## 2023-11-10 DIAGNOSIS — M1990 Unspecified osteoarthritis, unspecified site: Secondary | ICD-10-CM | POA: Insufficient documentation

## 2023-11-10 DIAGNOSIS — E041 Nontoxic single thyroid nodule: Secondary | ICD-10-CM | POA: Diagnosis not present

## 2023-11-10 DIAGNOSIS — K219 Gastro-esophageal reflux disease without esophagitis: Secondary | ICD-10-CM | POA: Diagnosis not present

## 2023-11-10 DIAGNOSIS — G510 Bell's palsy: Secondary | ICD-10-CM | POA: Diagnosis not present

## 2023-11-10 DIAGNOSIS — G4730 Sleep apnea, unspecified: Secondary | ICD-10-CM | POA: Insufficient documentation

## 2023-11-10 DIAGNOSIS — E042 Nontoxic multinodular goiter: Secondary | ICD-10-CM | POA: Diagnosis present

## 2023-11-10 DIAGNOSIS — K449 Diaphragmatic hernia without obstruction or gangrene: Secondary | ICD-10-CM | POA: Insufficient documentation

## 2023-11-10 DIAGNOSIS — E0590 Thyrotoxicosis, unspecified without thyrotoxic crisis or storm: Secondary | ICD-10-CM | POA: Insufficient documentation

## 2023-11-10 HISTORY — PX: THYROID LOBECTOMY: SHX420

## 2023-11-10 LAB — CBC
HCT: 46.5 % — ABNORMAL HIGH (ref 36.0–46.0)
Hemoglobin: 14.6 g/dL (ref 12.0–15.0)
MCH: 27.9 pg (ref 26.0–34.0)
MCHC: 31.4 g/dL (ref 30.0–36.0)
MCV: 88.7 fL (ref 80.0–100.0)
Platelets: 332 K/uL (ref 150–400)
RBC: 5.24 MIL/uL — ABNORMAL HIGH (ref 3.87–5.11)
RDW: 12.9 % (ref 11.5–15.5)
WBC: 8.2 K/uL (ref 4.0–10.5)
nRBC: 0 % (ref 0.0–0.2)

## 2023-11-10 SURGERY — LOBECTOMY, THYROID
Anesthesia: General | Laterality: Left

## 2023-11-10 MED ORDER — LIDOCAINE HCL (PF) 2 % IJ SOLN
INTRAMUSCULAR | Status: AC
  Filled 2023-11-10: qty 5

## 2023-11-10 MED ORDER — SUGAMMADEX SODIUM 200 MG/2ML IV SOLN
INTRAVENOUS | Status: DC | PRN
  Administered 2023-11-10: 200 mg via INTRAVENOUS

## 2023-11-10 MED ORDER — ONDANSETRON HCL 4 MG/2ML IJ SOLN
INTRAMUSCULAR | Status: DC | PRN
  Administered 2023-11-10: 4 mg via INTRAVENOUS

## 2023-11-10 MED ORDER — PROPOFOL 10 MG/ML IV BOLUS
INTRAVENOUS | Status: AC
  Filled 2023-11-10: qty 20

## 2023-11-10 MED ORDER — FENTANYL CITRATE (PF) 100 MCG/2ML IJ SOLN
INTRAMUSCULAR | Status: AC
  Filled 2023-11-10: qty 2

## 2023-11-10 MED ORDER — ROCURONIUM BROMIDE 10 MG/ML (PF) SYRINGE
PREFILLED_SYRINGE | INTRAVENOUS | Status: AC
  Filled 2023-11-10: qty 10

## 2023-11-10 MED ORDER — ORAL CARE MOUTH RINSE: 15.0000 mL | Freq: Once | OROMUCOSAL | Status: AC

## 2023-11-10 MED ORDER — FENTANYL CITRATE (PF) 50 MCG/ML IJ SOSY
PREFILLED_SYRINGE | INTRAMUSCULAR | Status: AC
  Filled 2023-11-10: qty 1

## 2023-11-10 MED ORDER — ACETAMINOPHEN 325 MG PO TABS: 650.0000 mg | ORAL_TABLET | Freq: Four times a day (QID) | ORAL | Status: DC | PRN

## 2023-11-10 MED ORDER — CHLORHEXIDINE GLUCONATE 0.12 % MT SOLN
15.0000 mL | Freq: Once | OROMUCOSAL | Status: AC
  Administered 2023-11-10: 15 mL via OROMUCOSAL

## 2023-11-10 MED ORDER — OXYCODONE HCL 5 MG PO TABS: 5.0000 mg | ORAL_TABLET | ORAL | Status: DC | PRN

## 2023-11-10 MED ORDER — ONDANSETRON HCL 4 MG/2ML IJ SOLN
INTRAMUSCULAR | Status: AC
  Filled 2023-11-10: qty 2

## 2023-11-10 MED ORDER — SCOPOLAMINE 1 MG/3DAYS TD PT72
1.0000 | MEDICATED_PATCH | TRANSDERMAL | Status: DC
  Administered 2023-11-10: 1 mg via TRANSDERMAL
  Filled 2023-11-10: qty 1

## 2023-11-10 MED ORDER — MIDAZOLAM HCL 5 MG/5ML IJ SOLN
INTRAMUSCULAR | Status: DC | PRN
  Administered 2023-11-10: 2 mg via INTRAVENOUS

## 2023-11-10 MED ORDER — ONDANSETRON HCL 4 MG/2ML IJ SOLN
4.0000 mg | Freq: Four times a day (QID) | INTRAMUSCULAR | Status: DC | PRN
  Administered 2023-11-10: 4 mg via INTRAVENOUS
  Filled 2023-11-10: qty 2

## 2023-11-10 MED ORDER — HEMOSTATIC AGENTS (NO CHARGE) OPTIME
TOPICAL | Status: DC | PRN
Start: 2023-11-10 — End: 2023-11-10
  Administered 2023-11-10: 1

## 2023-11-10 MED ORDER — ACETAMINOPHEN 650 MG RE SUPP: 650.0000 mg | Freq: Four times a day (QID) | RECTAL | Status: DC | PRN

## 2023-11-10 MED ORDER — LACTATED RINGERS IV SOLN: INTRAVENOUS | Status: DC

## 2023-11-10 MED ORDER — FENTANYL CITRATE (PF) 50 MCG/ML IJ SOSY
25.0000 ug | PREFILLED_SYRINGE | INTRAMUSCULAR | Status: DC | PRN
  Administered 2023-11-10: 25 ug via INTRAVENOUS
  Administered 2023-11-10 (×2): 50 ug via INTRAVENOUS
  Administered 2023-11-10: 25 ug via INTRAVENOUS

## 2023-11-10 MED ORDER — ROCURONIUM BROMIDE 10 MG/ML (PF) SYRINGE
PREFILLED_SYRINGE | INTRAVENOUS | Status: DC | PRN
  Administered 2023-11-10: 60 mg via INTRAVENOUS
  Administered 2023-11-10: 20 mg via INTRAVENOUS

## 2023-11-10 MED ORDER — METHOCARBAMOL 1000 MG/10ML IJ SOLN
500.0000 mg | Freq: Four times a day (QID) | INTRAMUSCULAR | Status: DC | PRN
  Administered 2023-11-10 (×2): 500 mg via INTRAVENOUS
  Filled 2023-11-10 (×2): qty 10

## 2023-11-10 MED ORDER — 0.9 % SODIUM CHLORIDE (POUR BTL) OPTIME
TOPICAL | Status: DC | PRN
  Administered 2023-11-10: 1000 mL

## 2023-11-10 MED ORDER — MIDAZOLAM HCL 2 MG/2ML IJ SOLN
INTRAMUSCULAR | Status: AC
  Filled 2023-11-10: qty 2

## 2023-11-10 MED ORDER — LIDOCAINE HCL (PF) 2 % IJ SOLN
INTRAMUSCULAR | Status: DC | PRN
  Administered 2023-11-10: 60 mg via INTRADERMAL

## 2023-11-10 MED ORDER — ONDANSETRON 4 MG PO TBDP: 4.0000 mg | ORAL_TABLET | Freq: Four times a day (QID) | ORAL | Status: DC | PRN

## 2023-11-10 MED ORDER — SODIUM CHLORIDE 0.45 % IV SOLN: INTRAVENOUS | Status: DC

## 2023-11-10 MED ORDER — PROPOFOL 10 MG/ML IV BOLUS
INTRAVENOUS | Status: DC | PRN
  Administered 2023-11-10: 25 ug/kg/min via INTRAVENOUS
  Administered 2023-11-10: 160 mg via INTRAVENOUS

## 2023-11-10 MED ORDER — HYDROMORPHONE HCL 1 MG/ML IJ SOLN
1.0000 mg | INTRAMUSCULAR | Status: DC | PRN
  Administered 2023-11-10 – 2023-11-11 (×3): 1 mg via INTRAVENOUS
  Filled 2023-11-10 (×3): qty 1

## 2023-11-10 MED ORDER — TRAMADOL HCL 50 MG PO TABS
50.0000 mg | ORAL_TABLET | Freq: Four times a day (QID) | ORAL | Status: DC | PRN
  Administered 2023-11-10: 50 mg via ORAL
  Filled 2023-11-10: qty 1

## 2023-11-10 MED ORDER — AMISULPRIDE (ANTIEMETIC) 5 MG/2ML IV SOLN: 10.0000 mg | Freq: Once | INTRAVENOUS | Status: DC | PRN

## 2023-11-10 MED ORDER — CHLORHEXIDINE GLUCONATE CLOTH 2 % EX PADS: 6.0000 | MEDICATED_PAD | Freq: Once | CUTANEOUS | Status: DC

## 2023-11-10 MED ORDER — ACETAMINOPHEN 500 MG PO TABS
1000.0000 mg | ORAL_TABLET | Freq: Once | ORAL | Status: AC
  Administered 2023-11-10: 1000 mg via ORAL
  Filled 2023-11-10: qty 2

## 2023-11-10 MED ORDER — FENTANYL CITRATE (PF) 50 MCG/ML IJ SOSY
PREFILLED_SYRINGE | INTRAMUSCULAR | Status: AC
  Filled 2023-11-10: qty 2

## 2023-11-10 MED ORDER — DEXAMETHASONE SOD PHOSPHATE PF 10 MG/ML IJ SOLN
INTRAMUSCULAR | Status: DC | PRN
  Administered 2023-11-10: 8 mg via INTRAVENOUS

## 2023-11-10 MED ORDER — PROPOFOL 1000 MG/100ML IV EMUL
INTRAVENOUS | Status: AC
  Filled 2023-11-10: qty 100

## 2023-11-10 MED ORDER — FENTANYL CITRATE (PF) 100 MCG/2ML IJ SOLN
INTRAMUSCULAR | Status: DC | PRN
  Administered 2023-11-10 (×5): 50 ug via INTRAVENOUS

## 2023-11-10 MED ORDER — CEFAZOLIN SODIUM-DEXTROSE 2-4 GM/100ML-% IV SOLN
2.0000 g | INTRAVENOUS | Status: AC
  Administered 2023-11-10: 2 g via INTRAVENOUS
  Filled 2023-11-10: qty 100

## 2023-11-10 SURGICAL SUPPLY — 28 items
BAG COUNTER SPONGE SURGICOUNT (BAG) ×2 IMPLANT
BLADE SURG 15 STRL LF DISP TIS (BLADE) ×2 IMPLANT
CHLORAPREP W/TINT 26 (MISCELLANEOUS) ×2 IMPLANT
CLIP TI MEDIUM 6 (CLIP) ×4 IMPLANT
CLIP TI WIDE RED SMALL 6 (CLIP) ×4 IMPLANT
COVER SURGICAL LIGHT HANDLE (MISCELLANEOUS) ×2 IMPLANT
DERMABOND ADVANCED .7 DNX12 (GAUZE/BANDAGES/DRESSINGS) ×2 IMPLANT
DERMABOND ADVANCED .7 DNX6 (GAUZE/BANDAGES/DRESSINGS) IMPLANT
DRAPE LAPAROTOMY T 98X78 PEDS (DRAPES) ×2 IMPLANT
DRAPE UTILITY XL STRL (DRAPES) ×2 IMPLANT
ELECT PENCIL ROCKER SW 15FT (MISCELLANEOUS) ×2 IMPLANT
ELECT REM PT RETURN 15FT ADLT (MISCELLANEOUS) ×2 IMPLANT
GAUZE 4X4 16PLY ~~LOC~~+RFID DBL (SPONGE) ×2 IMPLANT
GLOVE SURG ORTHO 8.0 STRL STRW (GLOVE) ×2 IMPLANT
GOWN STRL REUS W/ TWL XL LVL3 (GOWN DISPOSABLE) ×4 IMPLANT
HEMOSTAT SURGICEL 2X4 FIBR (HEMOSTASIS) ×2 IMPLANT
ILLUMINATOR WAVEGUIDE N/F (MISCELLANEOUS) ×2 IMPLANT
KIT BASIN OR (CUSTOM PROCEDURE TRAY) ×2 IMPLANT
KIT TURNOVER KIT A (KITS) ×2 IMPLANT
PACK BASIC VI WITH GOWN DISP (CUSTOM PROCEDURE TRAY) ×2 IMPLANT
PAD MAGNETIC INSTR ST 16X20 (MISCELLANEOUS) ×2 IMPLANT
SHEARS HARMONIC 9CM CVD (BLADE) ×2 IMPLANT
SUT MNCRL AB 4-0 PS2 18 (SUTURE) ×2 IMPLANT
SUT SILK 3 0 SH 30 (SUTURE) ×2 IMPLANT
SUT VIC AB 3-0 SH 18 (SUTURE) ×4 IMPLANT
SYR BULB IRRIG 60ML STRL (SYRINGE) ×2 IMPLANT
TOWEL OR 17X26 10 PK STRL BLUE (TOWEL DISPOSABLE) ×2 IMPLANT
TUBING CONNECTING 10 (TUBING) ×2 IMPLANT

## 2023-11-10 NOTE — Anesthesia Procedure Notes (Signed)
 Procedure Name: Intubation Date/Time: 11/10/2023 11:33 AM  Performed by: Memory Armida LABOR, CRNAPre-anesthesia Checklist: Patient identified, Emergency Drugs available, Suction available, Patient being monitored and Timeout performed Patient Re-evaluated:Patient Re-evaluated prior to induction Oxygen Delivery Method: Simple face mask Preoxygenation: Pre-oxygenation with 100% oxygen Induction Type: IV induction Ventilation: Mask ventilation without difficulty Laryngoscope Size: Mac and 4 Grade View: Grade II Tube type: Oral Tube size: 7.5 mm Number of attempts: 1 Airway Equipment and Method: Stylet Placement Confirmation: ETT inserted through vocal cords under direct vision, positive ETCO2 and breath sounds checked- equal and bilateral Secured at: 21 cm Tube secured with: Tape Dental Injury: Teeth and Oropharynx as per pre-operative assessment

## 2023-11-10 NOTE — Transfer of Care (Signed)
 Immediate Anesthesia Transfer of Care Note  Patient: Kayla Sexton  Procedure(s) Performed: LOBECTOMY, THYROID  (Left)  Patient Location: PACU  Anesthesia Type:General  Level of Consciousness: awake, alert , oriented, and patient cooperative  Airway & Oxygen Therapy: Patient Spontanous Breathing and Patient connected to face mask oxygen  Post-op Assessment: Report given to RN, Post -op Vital signs reviewed and stable, and Patient moving all extremities  Post vital signs: Reviewed and stable  Last Vitals:  Vitals Value Taken Time  BP 175/108 11/10/23 13:06  Temp    Pulse 95 11/10/23 13:09  Resp 16 11/10/23 13:09  SpO2 100 % 11/10/23 13:09  Vitals shown include unfiled device data.  Last Pain:  Vitals:   11/10/23 1113  TempSrc:   PainSc: 0-No pain      Patients Stated Pain Goal: 5 (11/10/23 9062)  Complications: No notable events documented.

## 2023-11-10 NOTE — Op Note (Signed)
 Procedure Note  Pre-operative Diagnosis:  multiple thyroid  nodules  Post-operative Diagnosis:  same  Surgeon:  [name], MD  Assistant:  none   Procedure:  Left thyroid  lobectomy and isthmusectomy  Anesthesia:  General  Estimated Blood Loss:  25 cc  Drains: none         Specimen: thyroid  lobe to pathology  Indications:  Patient is referred by her primary care physician, Dr. Anton Blas, and her endocrinologist, Duwaine Clover, NP, for surgical evaluation and recommendations regarding multiple thyroid  nodules. Patient began having significant symptoms approximately 1 month ago. She has had problems over the past month with sore throat, difficulty swallowing, left ear pain, and facial asymmetry. She was diagnosed with Bell's palsy. He apparently underwent evaluation showing pharyngitis and a possible esophageal ulceration. Patient underwent CT scan which had an incidental finding of thyroid  nodules. Patient underwent an ultrasound 2 weeks ago which demonstrated a mildly enlarged left thyroid  lobe with multiple small thyroid  nodules on the left side. The largest and most significant nodule measured 1.3 cm, was solid and hypoechoic, and ultrasound follow-up was recommended in 1 year. TSH level was normal at 0.65. Patient has had no prior head or neck surgery. She has never been on thyroid  medication. 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   Procedure Details: Procedure was done in OR #4 at the [facility]. The patient was brought to the operating room and placed in a supine position on the operating room table. Following administration of general anesthesia, the patient was positioned and then prepped and draped in the usual aseptic fashion. After ascertaining that an adequate level of anesthesia had been achieved, a small Kocher incision was made with #15 blade. Dissection was carried through subcutaneous  tissues and platysma. Hemostasis was achieved with the electrocautery. Skin flaps were elevated cephalad and caudad from the thyroid  notch to the sternal notch. A self-retaining retractor was placed for exposure. Strap muscles were incised in the midline and dissection was begun on the left side. Strap muscles were reflected laterally. The left thyroid  lobe was moderately enlarged and multinodular. The lobe was gently mobilized with blunt dissection. Superior pole vessels were dissected out and divided individually between small and medium ligaclips with the harmonic scalpel. The thyroid  lobe was rolled anteriorly. Branches of the inferior thyroid  artery were divided between small ligaclips with the harmonic scalpel. Inferior venous tributaries were divided between ligaclips. Both the superior and inferior parathyroid glands were identified and preserved on their vascular pedicles. The recurrent laryngeal nerve was identified and preserved along its course. The ligament of Court was released with the electrocautery and the gland was mobilized onto the anterior trachea. Isthmus was mobilized across the midline. There was a small pyramidal lobe present  which was resected with the isthmus. The thyroid  parenchyma was transected at the junction of the isthmus and contralateral thyroid  lobe with the harmonic scalpel. A suture was used to mark the isthmus margin. The thyroid  lobe and isthmus were submitted to pathology for review.  The entire field was palpated for evidence of lymphadenopathy or extra-thyroidal disease.  No worrisome findings were noted.  No enlarged lymph nodes were identified.  The neck was irrigated with warm saline. Fibrillar was placed throughout the operative field. Strap muscles were approximated in the midline with interrupted 3-0 Vicryl sutures. Platysma was closed with interrupted 3-0 Vicryl sutures. Skin was closed with a running 4-0 Monocryl subcuticular suture.  Wound was washed and dried  and Dermabond was applied. The patient was awakened from anesthesia and brought to the recovery room. The patient tolerated the procedure well.   [name], MD [facility] Office: [phone]

## 2023-11-10 NOTE — Interval H&P Note (Signed)
 History and Physical Interval Note:  11/10/2023 11:06 AM  Kayla Sexton  has presented today for surgery, with the diagnosis of MULTIPLE THYROID  NODULES.  The various methods of treatment have been discussed with the patient and family. After consideration of risks, benefits and other options for treatment, the patient has consented to    Procedure(s): LOBECTOMY, THYROID  (Left) as a surgical intervention.    The patient's history has been reviewed, patient examined, no change in status, stable for surgery.  I have reviewed the patient's chart and labs.  Questions were answered to the patient's satisfaction.    [name], MD [facility] practice Office: [phone]   [name]

## 2023-11-11 ENCOUNTER — Encounter (HOSPITAL_COMMUNITY): Payer: Self-pay | Admitting: Surgery

## 2023-11-11 DIAGNOSIS — E042 Nontoxic multinodular goiter: Secondary | ICD-10-CM | POA: Diagnosis not present

## 2023-11-11 MED ORDER — OXYCODONE HCL 5 MG PO TABS: 5.0000 mg | ORAL_TABLET | Freq: Four times a day (QID) | ORAL | 0 refills | Status: DC | PRN

## 2023-11-11 MED ORDER — METHOCARBAMOL 500 MG PO TABS: 500.0000 mg | ORAL_TABLET | Freq: Four times a day (QID) | ORAL | Status: DC | PRN

## 2023-11-11 MED ORDER — DIPHENHYDRAMINE HCL 25 MG PO CAPS
25.0000 mg | ORAL_CAPSULE | Freq: Four times a day (QID) | ORAL | Status: DC | PRN
  Administered 2023-11-11: 25 mg via ORAL
  Filled 2023-11-11: qty 1

## 2023-11-11 NOTE — Progress Notes (Signed)
   11/11/23 1025  TOC Brief Assessment  Insurance and Status Reviewed  Patient has primary care physician Yes  Home environment has been reviewed resides in a private residence  Prior level of function: Independent  Prior/Current Home Services No current home services  Social Drivers of Health Review SDOH reviewed no interventions necessary  Transition of care needs no transition of care needs at this time

## 2023-11-11 NOTE — Discharge Instructions (Signed)

## 2023-11-11 NOTE — Anesthesia Postprocedure Evaluation (Signed)
 Anesthesia Post Note  Patient: Kayla Sexton  Procedure(s) Performed: LOBECTOMY, THYROID  (Left)     Patient location during evaluation: PACU Anesthesia Type: General Level of consciousness: awake and alert Pain management: pain level controlled Vital Signs Assessment: post-procedure vital signs reviewed and stable Respiratory status: spontaneous breathing, nonlabored ventilation, respiratory function stable and patient connected to nasal cannula oxygen Cardiovascular status: blood pressure returned to baseline and stable Postop Assessment: no apparent nausea or vomiting Anesthetic complications: no   No notable events documented.  Last Vitals:  Vitals:   11/11/23 0323 11/11/23 0625  BP: 131/82 103/74  Pulse: 95 67  Resp: 16 17  Temp: 36.6 C 36.6 C  SpO2: 95% 96%    Last Pain:  Vitals:   11/11/23 0801  TempSrc:   PainSc: 3                  Manuel Dall P Tayona Sarnowski

## 2023-11-11 NOTE — Progress Notes (Signed)
 Patient was given discharge instructions, and all questions were answered.  Patient was stable for discharge and was taken to the main exit by wheelchair.

## 2023-11-11 NOTE — Progress Notes (Signed)
 Pt reported itching all over. Noted no welts or hives. Placed call to answering service for Dr. Camellia Blush. He returned call.  Informed him of the above. Orders received.

## 2023-11-11 NOTE — Discharge Summary (Signed)
 Physician Discharge Summary   Patient ID: Kayla Sexton MRN: [id number] DOB/AGE: 08-05-1980 43 y.o.  Admit date: 11/10/2023  Discharge date: 11/11/2023  Discharge Diagnoses:  Principal Problem:   Multiple thyroid  nodules   Discharged Condition: good  Hospital Course: Patient was admitted for observation following thyroid  lobectomy.  Post op course was uncomplicated.  Pain was well controlled.  Tolerated diet.  Patient was prepared for discharge home on POD#1.  Consults: None  Treatments: surgery: left thyroid  lobectomy  Discharge Exam: Blood pressure 103/74, pulse 67, temperature 97.8 F (36.6 C), temperature source Oral, resp. rate 17, height 5' 1 (1.549 m), weight 84.4 kg, last menstrual period 09/29/2020, SpO2 96%. HEENT - clear Neck - wound dry and intact; mild STS; mild hoarseness; Dermabond in place  Disposition: Home  Discharge Instructions     Diet - low sodium heart healthy   Complete by: As directed    Increase activity slowly   Complete by: As directed    No dressing needed   Complete by: As directed       Allergies as of 11/11/2023       Reactions   Adhesive [tape] Swelling, Dermatitis, Other (See Comments)   Per the patient, only the 12 lead ekg pads cause welts, blisters and abrasions -ALSO, SOME BAND-AIDS, as well- OK TO USE PAPER TAPE   Alpha-gal Anaphylaxis, Hives, Swelling   Other Anaphylaxis, Nausea And Vomiting, Other (See Comments)   RED MEAT-ANAPHYLAXIS General Anesthesia - nausea and vomiting (scopolamine  patch doesn't work)   Vancomycin  Dermatitis, Hives, Itching, Anaphylaxis, Other (See Comments)   Other Reaction(s): rash;welts   Metformin And Related Other (See Comments)   GI upset    Bactrim  [sulfamethoxazole -trimethoprim ] Rash   Silicone Dermatitis, Other (See Comments)   redness        Medication List     TAKE these medications    Aleve 220 MG tablet Generic drug: naproxen sodium Take 220-440 mg by mouth 2 (two)  times daily as needed (for pain).   cyclobenzaprine  5 MG tablet Commonly known as: FLEXERIL  Take 5 mg by mouth daily as needed for muscle spasms.   famotidine  20 MG tablet Commonly known as: PEPCID  TAKE 1 TABLET BY MOUTH TWICE A DAY What changed:  when to take this reasons to take this additional instructions   gabapentin  300 MG capsule Commonly known as: NEURONTIN  Take 1 capsule (300 mg total) by mouth at bedtime. What changed:  when to take this reasons to take this   oxyCODONE  5 MG immediate release tablet Commonly known as: Oxy IR/ROXICODONE  Take 1-2 tablets (5-10 mg total) by mouth every 6 (six) hours as needed for moderate pain (pain score 4-6).               Discharge Care Instructions  (From admission, onward)           Start     Ordered   11/11/23 0000  No dressing needed        11/11/23 0920            Follow-up Information     Eletha Boas, MD. Schedule an appointment as soon as possible for a visit in 3 week(s).   Specialty: General Surgery Why: For wound re-check Contact information: [address] [place] [address] [phone]                 Boas Eletha, MD [facility] Office: [phone]   Signed: Boas Eletha 11/11/2023,  9:21 AM

## 2023-11-13 ENCOUNTER — Other Ambulatory Visit: Payer: Self-pay

## 2023-11-13 ENCOUNTER — Emergency Department (HOSPITAL_COMMUNITY)

## 2023-11-13 ENCOUNTER — Observation Stay (HOSPITAL_COMMUNITY)
Admission: EM | Admit: 2023-11-13 | Discharge: 2023-11-18 | Disposition: A | Discharge status: Home / Self Care | Attending: Internal Medicine | Admitting: Internal Medicine

## 2023-11-13 ENCOUNTER — Telehealth: Payer: Self-pay | Admitting: General Surgery

## 2023-11-13 DIAGNOSIS — L0390 Cellulitis, unspecified: Secondary | ICD-10-CM | POA: Diagnosis present

## 2023-11-13 DIAGNOSIS — L03221 Cellulitis of neck: Principal | ICD-10-CM | POA: Diagnosis present

## 2023-11-13 DIAGNOSIS — I808 Phlebitis and thrombophlebitis of other sites: Secondary | ICD-10-CM | POA: Diagnosis not present

## 2023-11-13 DIAGNOSIS — T8140XA Infection following a procedure, unspecified, initial encounter: Secondary | ICD-10-CM | POA: Diagnosis not present

## 2023-11-13 DIAGNOSIS — N390 Urinary tract infection, site not specified: Secondary | ICD-10-CM | POA: Insufficient documentation

## 2023-11-13 DIAGNOSIS — M7989 Other specified soft tissue disorders: Secondary | ICD-10-CM | POA: Diagnosis present

## 2023-11-13 DIAGNOSIS — G4733 Obstructive sleep apnea (adult) (pediatric): Secondary | ICD-10-CM | POA: Diagnosis not present

## 2023-11-13 DIAGNOSIS — Z79899 Other long term (current) drug therapy: Secondary | ICD-10-CM | POA: Insufficient documentation

## 2023-11-13 DIAGNOSIS — N200 Calculus of kidney: Secondary | ICD-10-CM | POA: Insufficient documentation

## 2023-11-13 DIAGNOSIS — T8143XA Infection following a procedure, organ and space surgical site, initial encounter: Principal | ICD-10-CM

## 2023-11-13 DIAGNOSIS — Z6835 Body mass index (BMI) 35.0-35.9, adult: Secondary | ICD-10-CM | POA: Diagnosis not present

## 2023-11-13 DIAGNOSIS — J45909 Unspecified asthma, uncomplicated: Secondary | ICD-10-CM | POA: Insufficient documentation

## 2023-11-13 LAB — I-STAT CG4 LACTIC ACID, ED
Lactic Acid, Venous: 1.5 mmol/L (ref 0.5–1.9)
Lactic Acid, Venous: 1.4 mmol/L (ref 0.5–1.9)

## 2023-11-13 LAB — CBC WITH DIFFERENTIAL/PLATELET
Abs Immature Granulocytes: 0.03 K/uL (ref 0.00–0.07)
Basophils Absolute: 0.1 K/uL (ref 0.0–0.1)
Basophils Relative: 1 %
Eosinophils Absolute: 0.2 K/uL (ref 0.0–0.5)
Eosinophils Relative: 2 %
HCT: 44 % (ref 36.0–46.0)
Hemoglobin: 14 g/dL (ref 12.0–15.0)
Immature Granulocytes: 0 %
Lymphocytes Relative: 42 %
Lymphs Abs: 4.3 K/uL — ABNORMAL HIGH (ref 0.7–4.0)
MCH: 27.9 pg (ref 26.0–34.0)
MCHC: 31.8 g/dL (ref 30.0–36.0)
MCV: 87.8 fL (ref 80.0–100.0)
Monocytes Absolute: 0.9 K/uL (ref 0.1–1.0)
Monocytes Relative: 9 %
Neutro Abs: 4.8 K/uL (ref 1.7–7.7)
Neutrophils Relative %: 46 %
Platelets: 294 K/uL (ref 150–400)
RBC: 5.01 MIL/uL (ref 3.87–5.11)
RDW: 13.1 % (ref 11.5–15.5)
WBC: 10.3 K/uL (ref 4.0–10.5)
nRBC: 0 % (ref 0.0–0.2)

## 2023-11-13 LAB — URINALYSIS, W/ REFLEX TO CULTURE (INFECTION SUSPECTED)
Bilirubin Urine: NEGATIVE
Glucose, UA: NEGATIVE mg/dL
Ketones, ur: NEGATIVE mg/dL
Nitrite: NEGATIVE
Protein, ur: NEGATIVE mg/dL
Specific Gravity, Urine: 1.011 (ref 1.005–1.030)
pH: 6 (ref 5.0–8.0)

## 2023-11-13 LAB — I-STAT CHEM 8, ED
BUN: 10 mg/dL (ref 6–20)
Calcium, Ion: 0.82 mmol/L — CL (ref 1.15–1.40)
Chloride: 106 mmol/L (ref 98–111)
Creatinine, Ser: 0.7 mg/dL (ref 0.44–1.00)
Glucose, Bld: 75 mg/dL (ref 70–99)
HCT: 42 % (ref 36.0–46.0)
Hemoglobin: 14.3 g/dL (ref 12.0–15.0)
Potassium: 4.5 mmol/L (ref 3.5–5.1)
Sodium: 139 mmol/L (ref 135–145)
TCO2: 24 mmol/L (ref 22–32)

## 2023-11-13 LAB — COMPREHENSIVE METABOLIC PANEL WITH GFR
ALT: 15 U/L (ref 0–44)
AST: 17 U/L (ref 15–41)
Albumin: 4.1 g/dL (ref 3.5–5.0)
Alkaline Phosphatase: 90 U/L (ref 38–126)
Anion gap: 13 (ref 5–15)
BUN: 10 mg/dL (ref 6–20)
CO2: 26 mmol/L (ref 22–32)
Calcium: 9 mg/dL (ref 8.9–10.3)
Chloride: 104 mmol/L (ref 98–111)
Creatinine, Ser: 0.73 mg/dL (ref 0.44–1.00)
GFR, Estimated: >60 mL/min (ref >60)
Glucose, Bld: 83 mg/dL (ref 70–99)
Potassium: 3.3 mmol/L — ABNORMAL LOW (ref 3.5–5.1)
Sodium: 144 mmol/L (ref 135–145)
Total Bilirubin: 0.9 mg/dL (ref 0.0–1.2)
Total Protein: 6.9 g/dL (ref 6.5–8.1)

## 2023-11-13 LAB — PROTIME-INR
INR: 0.9 (ref 0.8–1.2)
Prothrombin Time: 13.2 s (ref 11.4–15.2)

## 2023-11-13 LAB — T4, FREE: Free T4: 1.06 ng/dL (ref 0.61–1.12)

## 2023-11-13 LAB — TSH: TSH: 0.884 u[IU]/mL (ref 0.350–4.500)

## 2023-11-13 LAB — HCG, SERUM, QUALITATIVE: Preg, Serum: NEGATIVE

## 2023-11-13 MED ORDER — CALCIUM GLUCONATE-NACL 1-0.675 GM/50ML-% IV SOLN
1.0000 g | Freq: Once | INTRAVENOUS | Status: AC
  Administered 2023-11-13: 1000 mg via INTRAVENOUS
  Filled 2023-11-13: qty 50

## 2023-11-13 MED ORDER — LINEZOLID 600 MG/300ML IV SOLN
600.0000 mg | Freq: Two times a day (BID) | INTRAVENOUS | Status: DC
  Filled 2023-11-13 (×2): qty 300

## 2023-11-13 MED ORDER — ONDANSETRON HCL 4 MG PO TABS
4.0000 mg | ORAL_TABLET | Freq: Four times a day (QID) | ORAL | Status: DC | PRN
  Administered 2023-11-14: 4 mg via ORAL
  Filled 2023-11-13: qty 1

## 2023-11-13 MED ORDER — GABAPENTIN 100 MG PO CAPS
300.0000 mg | ORAL_CAPSULE | Freq: Every evening | ORAL | Status: DC | PRN
  Administered 2023-11-14 – 2023-11-17 (×4): 300 mg via ORAL
  Filled 2023-11-13 (×4): qty 3

## 2023-11-13 MED ORDER — ONDANSETRON HCL 4 MG/2ML IJ SOLN
4.0000 mg | Freq: Four times a day (QID) | INTRAMUSCULAR | Status: DC | PRN
Start: 2023-11-13 — End: 2023-11-18
  Administered 2023-11-13 – 2023-11-17 (×2): 4 mg via INTRAVENOUS
  Filled 2023-11-13 (×2): qty 2

## 2023-11-13 MED ORDER — METHYLPREDNISOLONE SODIUM SUCC 40 MG IJ SOLR
40.0000 mg | Freq: Once | INTRAMUSCULAR | Status: AC
  Administered 2023-11-13: 40 mg via INTRAVENOUS
  Filled 2023-11-13: qty 1

## 2023-11-13 MED ORDER — SODIUM CHLORIDE 0.9 % IV SOLN
2.0000 g | Freq: Once | INTRAVENOUS | Status: AC
  Administered 2023-11-13: 2 g via INTRAVENOUS
  Filled 2023-11-13: qty 20

## 2023-11-13 MED ORDER — ACETAMINOPHEN 650 MG RE SUPP: 650.0000 mg | Freq: Four times a day (QID) | RECTAL | Status: DC | PRN

## 2023-11-13 MED ORDER — IOHEXOL 300 MG/ML  SOLN
100.0000 mL | Freq: Once | INTRAMUSCULAR | Status: AC | PRN
Start: 2023-11-13 — End: 2023-11-13
  Administered 2023-11-13: 100 mL via INTRAVENOUS

## 2023-11-13 MED ORDER — ACETAMINOPHEN 325 MG PO TABS
650.0000 mg | ORAL_TABLET | Freq: Four times a day (QID) | ORAL | Status: DC | PRN
  Administered 2023-11-13 – 2023-11-17 (×3): 650 mg via ORAL
  Filled 2023-11-13 (×3): qty 2

## 2023-11-13 MED ORDER — LINEZOLID 600 MG/300ML IV SOLN
600.0000 mg | Freq: Two times a day (BID) | INTRAVENOUS | Status: DC
  Administered 2023-11-13 – 2023-11-16 (×6): 600 mg via INTRAVENOUS
  Filled 2023-11-13 (×6): qty 300

## 2023-11-13 MED ORDER — CYCLOBENZAPRINE HCL 5 MG PO TABS
5.0000 mg | ORAL_TABLET | Freq: Every day | ORAL | Status: DC | PRN
  Administered 2023-11-13 – 2023-11-16 (×4): 5 mg via ORAL
  Filled 2023-11-13 (×5): qty 1

## 2023-11-13 MED ORDER — FAMOTIDINE IN NACL 20-0.9 MG/50ML-% IV SOLN
20.0000 mg | Freq: Two times a day (BID) | INTRAVENOUS | Status: AC
  Administered 2023-11-13 – 2023-11-14 (×3): 20 mg via INTRAVENOUS
  Filled 2023-11-13 (×3): qty 50

## 2023-11-13 MED ORDER — OXYCODONE HCL 5 MG PO TABS
5.0000 mg | ORAL_TABLET | Freq: Four times a day (QID) | ORAL | Status: DC | PRN
  Administered 2023-11-13 – 2023-11-16 (×6): 10 mg via ORAL
  Filled 2023-11-13 (×6): qty 2

## 2023-11-13 NOTE — H&P (Addendum)
 History and Physical    Kayla Sexton FMW:[id number] DOB: 10/24/1980 DOA: 11/13/2023  I have briefly reviewed the patient's prior medical records in [facility]  PCP: Rilla Baller, MD  Patient coming from: home  Chief Complaint: neck and left arm swelling  HPI: Kayla Sexton is a 43 y.o. female with medical history significant of multiple thyroid  nodules, bothering her, status post left thyroid  lobectomy and isthmusectomy 10/16 by Dr. Eletha, CIN 3, history of kidney stones, alpha gal hypersensitivity, prediabetes, comes to the hospital on postoperative day 3 for increased left arm swelling where she had an IV while she was hospitalized, also increased pain, swelling and redness at the thyroidectomy surgical site.  This has been going on for the past couple of days, she had an antibiotic called in as an outpatient but that did not help much.  She reports subjective chills but no fevers.  Reports some intermittent nausea and poor p.o. intake, and also when she eats she feels like the food is having a hard time going down.  She denies any vomiting.  She also reports that she has seen some slight drainage from the surgical site.  She also reports some minimal lower extremity swelling, bilateral, that was not there prior to her surgery  ED Course: In the emergency room she is afebrile, normo /hypertensive, satting well on room air.  Blood work pertinent for normal renal function, white count is normal as well.  Case was discussed with general surgery by EDP.  A CT scan of the neck showed postoperative changes of left thyroidectomy, but also an adjacent subcutaneous ill-defined fluid with possible developing cellulitis/phlegmon, no abscesses seen.  Blood cultures were obtained, she was placed on IV antibiotics we are asked to admit to the hospital and general surgery will formally consult  Review of Systems: All systems reviewed, and apart from HPI, all negative  Past Medical History:   Diagnosis Date   Anemia    Anxiety    Arthritis    Asthma    Cancer (HCC) 2006   CERVICAL , stage 1 ovarian cancer   CIN III (cervical intraepithelial neoplasia III)    Dysuria    Family history of adverse reaction to anesthesia    DAD AND BROTHER AND MOM-N/V   GERD (gastroesophageal reflux disease)    OCC   Hematuria    HH (hiatus hernia)    History of cervical dysplasia 2006   CIN1 and CIN2   History of kidney stones    Migraines    PCOS (polycystic ovarian syndrome)    Dr. Mat   PONV (postoperative nausea and vomiting)    HARD TO WAKE UP   Right ureteral calculus    Sleep apnea    DOES NOT USE CPAP   Urinary incontinence without sensory awareness     Past Surgical History:  Procedure Laterality Date   ABDOMINAL HYSTERECTOMY     total / laporscopic   CERVICAL BIOPSY  W/ LOOP ELECTRODE EXCISION  12-04-2004   dr mat  [facility], [place]   COLONOSCOPY  03/2008   Dr. Dianna   COMBINED HYSTEROSCOPY DIAGNOSTIC / D&C  02-28-2012   dr mat   @ [facility]   CYSTOSCOPY W/ RETROGRADES Right 03/31/2018   Procedure: CYSTOSCOPY WITH RETROGRADE PYELOGRAM;  Surgeon: Francisca Redell BROCKS, MD;  Location: [facility];  Service: Urology;  Laterality: Right;   CYSTOSCOPY W/ RETROGRADES  11/11/2020   Procedure: CYSTOSCOPY WITH RETROGRADE PYELOGRAM;  Surgeon: Francisca Redell BROCKS, MD;  Location:  [facility];  Service: Urology;;   CYSTOSCOPY WITH RETROGRADE PYELOGRAM, URETEROSCOPY AND STENT PLACEMENT Bilateral 07/21/2017   Procedure: CYSTOSCOPY WITH BILATERAL  RETROGRADE BILATERAL URETEROSCOPY AND STENTS  PLACEMENT;  Surgeon: Watt Rush, MD;  Location: [facility];  Service: Urology;  Laterality: Bilateral;   CYSTOSCOPY WITH RETROGRADE URETHROGRAM Left 01/04/2020   Procedure: CYSTOSCOPY WITH RETROGRADE URETHROGRAM;  Surgeon: Francisca Redell BROCKS, MD;  Location: [facility];  Service: Urology;  Laterality: Left;   CYSTOSCOPY/URETEROSCOPY/HOLMIUM LASER/STENT PLACEMENT Left 03/17/2018   Procedure:  CYSTOSCOPY/URETEROSCOPY/HOLMIUM LASER/STENT PLACEMENT;  Surgeon: Francisca Redell BROCKS, MD;  Location: [facility];  Service: Urology;  Laterality: Left;   CYSTOSCOPY/URETEROSCOPY/HOLMIUM LASER/STENT PLACEMENT Right 03/31/2018   Procedure: CYSTOSCOPY/URETEROSCOPY/HOLMIUM LASER/STENT PLACEMENT;  Surgeon: Francisca Redell BROCKS, MD;  Location: [facility];  Service: Urology;  Laterality: Right;   CYSTOSCOPY/URETEROSCOPY/HOLMIUM LASER/STENT PLACEMENT Left 01/04/2020   Procedure: CYSTOSCOPY/URETEROSCOPY/HOLMIUM LASER;  Surgeon: Francisca Redell BROCKS, MD;  Location: [facility];  Service: Urology;  Laterality: Left;   CYSTOSCOPY/URETEROSCOPY/HOLMIUM LASER/STENT PLACEMENT Left 11/11/2020   Procedure: CYSTOSCOPY/URETEROSCOPY/HOLMIUM LASER;  Surgeon: Francisca Redell BROCKS, MD;  Location: [facility];  Service: Urology;  Laterality: Left;   CYSTOSCOPY/URETEROSCOPY/HOLMIUM LASER/STENT PLACEMENT Left 07/02/2022   Procedure: CYSTOSCOPY/URETEROSCOPY/HOLMIUM LASER FOR STONE;  Surgeon: Francisca Redell BROCKS, MD;  Location: [facility];  Service: Urology;  Laterality: Left;   CYSTOSCOPY/URETEROSCOPY/HOLMIUM LASER/STENT PLACEMENT Left 03/25/2023   Procedure: CYSTOSCOPY/URETEROSCOPY/HOLMIUM LASER;  Surgeon: Francisca Redell BROCKS, MD;  Location: [facility];  Service: Urology;  Laterality: Left;   DILATION AND CURETTAGE OF UTERUS     endoscopy  2010   Dr. Dyane   EXTRACORPOREAL SHOCK WAVE LITHOTRIPSY  08/2021   FINGER SURGERY Right    MIDDLE FINGER   FOOT SURGERY Left 2015   5th bunionectomy w/ 2nd digit w/ fusion , retained hardware w/ 5th toe   FRACTURE SURGERY     left shoulder   HOLMIUM LASER APPLICATION Bilateral 07/21/2017   Procedure: HOLMIUM LASER APPLICATION;  Surgeon: Watt Rush, MD;  Location: [facility];  Service: Urology;  Laterality: Bilateral;   THYROID  LOBECTOMY Left 11/10/2023   Procedure: LOBECTOMY, THYROID ;  Surgeon: Eletha Boas, MD;  Location: [facility];  Service: General;  Laterality: Left;     reports that she has never  smoked. She has never been exposed to tobacco smoke. She has never used smokeless tobacco. She reports that she does not drink alcohol and does not use drugs.  Allergies  Allergen Reactions   Adhesive [Tape] Swelling, Dermatitis and Other (See Comments)    Per the patient, only the 12 lead ekg pads cause welts, blisters and abrasions -ALSO, SOME BAND-AIDS, as well- OK TO USE PAPER TAPE   Alpha-Gal Anaphylaxis, Hives and Swelling   Other Anaphylaxis, Nausea And Vomiting and Other (See Comments)    RED MEAT-ANAPHYLAXIS  General Anesthesia - nausea and vomiting (scopolamine  patch doesn't work)   Vancomycin  Dermatitis, Hives, Itching, Anaphylaxis and Other (See Comments)    Other Reaction(s): rash;welts   Bactrim  [Sulfamethoxazole -Trimethoprim ] Rash   Metformin And Related Other (See Comments)    GI upset    Silicone Dermatitis and Other (See Comments)    redness    Family History  Problem Relation Age of Onset   Healthy Mother    Healthy Father    Breast cancer Paternal Grandmother    Breast cancer Other        Maternal Great Aunt    Prior to Admission medications   Medication Sig Start Date End Date Taking? Authorizing Provider  ALEVE  220 MG tablet Take 220-440 mg by mouth 2 (two) times daily as needed (for pain).    [provider]  cephALEXin  (KEFLEX ) 500 MG capsule Take 500 mg by mouth 4 (four) times daily. 11/12/23 11/15/23  [provider]  cyclobenzaprine  (FLEXERIL ) 5 MG tablet Take 5 mg by mouth daily as needed for muscle spasms. 12/29/22   [provider]  famotidine  (PEPCID ) 20 MG tablet TAKE 1 TABLET BY MOUTH TWICE A DAY Patient taking differently: Take 20 mg by mouth 2 (two) times daily as needed for heartburn or indigestion. As needed 01/23/23   Rilla Baller, MD  gabapentin  (NEURONTIN ) 300 MG capsule Take 1 capsule (300 mg total) by mouth at bedtime. Patient taking differently: Take 300 mg by mouth at bedtime as needed (pain.). 09/09/23    Rilla Baller, MD  oxyCODONE  (OXY IR/ROXICODONE ) 5 MG immediate release tablet Take 1-2 tablets (5-10 mg total) by mouth every 6 (six) hours as needed for moderate pain (pain score 4-6). 11/11/23   Eletha Boas, MD    Physical Exam: Vitals:   11/13/23 1047 11/13/23 1058 11/13/23 1447  BP: (!) 160/108  (!) 139/105  Pulse: (!) 115  100  Resp: 19  17  Temp: 98 F (36.7 C)  98.4 F (36.9 C)  TempSrc:   Oral  SpO2: 98%  99%  Weight:  84.4 kg   Height:  5' 1 (1.549 m)    Constitutional: NAD, calm, comfortable Eyes: PERRL, lids and conjunctivae normal ENMT: Mucous membranes are moist.  Neck: normal, supple Respiratory: clear to auscultation bilaterally, no wheezing, no crackles. Cardiovascular: Regular rate and rhythm, no murmurs / rubs / gallops.  Trace edema Abdomen: no tenderness, no masses palpated. Bowel sounds positive.  Musculoskeletal: no clubbing / cyanosis. Normal muscle tone.  Skin: Rash as below Neurologic: Nonfocal    Labs on Admission: I have personally reviewed following labs and imaging studies  CBC: Recent Labs  Lab 11/10/23 1009 11/13/23 1141 11/13/23 1500  WBC 8.2 10.3  --   NEUTROABS  --  4.8  --   HGB 14.6 14.0 14.3  HCT 46.5* 44.0 42.0  MCV 88.7 87.8  --   PLT 332 294  --    Basic Metabolic Panel: Recent Labs  Lab 11/13/23 1141 11/13/23 1500  NA 144 139  K 3.3* 4.5  CL 104 106  CO2 26  --   GLUCOSE 83 75  BUN 10 10  CREATININE 0.73 0.70  CALCIUM 9.0  --    Liver Function Tests: Recent Labs  Lab 11/13/23 1141  AST 17  ALT 15  ALKPHOS 90  BILITOT 0.9  PROT 6.9  ALBUMIN 4.1   Coagulation Profile: Recent Labs  Lab 11/13/23 1141  INR 0.9   BNP (last 3 results) No results for input(s): PROBNP in the last 8760 hours. CBG: No results for input(s): GLUCAP in the last 168 hours. Thyroid  Function Tests: Recent Labs    11/13/23 1141  TSH 0.884   Urine analysis:    Component Value Date/Time   COLORURINE YELLOW  11/13/2023 1142   APPEARANCEUR CLEAR 11/13/2023 1142   APPEARANCEUR Clear 03/21/2023 1334   LABSPEC 1.011 11/13/2023 1142   LABSPEC 1.025 03/08/2018 1636   PHURINE 6.0 11/13/2023 1142   GLUCOSEU NEGATIVE 11/13/2023 1142   HGBUR SMALL (A) 11/13/2023 1142   BILIRUBINUR NEGATIVE 11/13/2023 1142   BILIRUBINUR negative 10/14/2023 1140   BILIRUBINUR Negative 03/21/2023 1334   KETONESUR NEGATIVE 11/13/2023 1142   PROTEINUR NEGATIVE 11/13/2023  1142   UROBILINOGEN 0.2 10/14/2023 1140   NITRITE NEGATIVE 11/13/2023 1142   LEUKOCYTESUR MODERATE (A) 11/13/2023 1142     Radiological Exams on Admission: CT Soft Tissue Neck W Contrast Result Date: 11/13/2023 EXAM: CT NECK WITH CONTRAST 11/13/2023 01:29:59 PM TECHNIQUE: CT of the neck was performed with the administration of 100 mL of iohexol  (OMNIPAQUE ) 300 MG/ML solution. Multiplanar reformatted images are provided for review. Automated exposure control, iterative reconstruction, and/or weight based adjustment of the mA/kV was utilized to reduce the radiation dose to as low as reasonably achievable. COMPARISON: Prominent CTA head and neck 18774 and thyroid  ultrasound 81425. CLINICAL HISTORY: Postoperative neck pain, redness, swelling, recent left thyroid  lobectomy. FINDINGS: AERODIGESTIVE TRACT: The nasopharynx and oropharynx are symmetric. Normal appearance of the tonsils. The oral cavity and floor of mouth are unremarkable. Normal appearance of the supraglottic airway. The vocal folds are symmetric. No significant narrowing of the extrathoracic trachea adjacent to the thyroidectomy site. No discrete mass. No edema. SALIVARY GLANDS: The parotid and submandibular glands are unremarkable. THYROID : Interval postsurgical changes of left thyroidectomy. There is postoperative fluid in the thyroidectomy bed with adjacent surgical clips. Residual right thyroid  lobe is unremarkable in appearance. LYMPH NODES: No pathologically enlarged lymph nodes in the neck. SOFT  TISSUES: Overlying stranding within the subcutaneous tissues. There is skin thickening along the anterior lower neck. There are areas of ill-defined fluid within the deeper subcutaneous tissues of the anterior neck extending towards the thyroidectomy bed. Few locules of gas which are favored to be postoperative in etiology. Findings concerning for postoperative changes with possible development of phlegmon/cellulitis. There is no peripherally enhancing fluid collection to suggest abscess formation. There are a few locules of gas noted extending superiorly and laterally within the subcutaneous tissues of the neck adjacent to the sternocleidomastoid muscles likely reflecting postoperative gas. There is no significantly associated stranding within this region. BRAIN, ORBITS, SINUSES AND MASTOIDS: No acute abnormality. LUNGS AND MEDIASTINUM: No acute abnormality. BONES: No focal bone abnormality. IMPRESSION: 1. Postoperative changes of left thyroidectomy. Adjacent subcutaneous stranding, ill-defined fluid, and skin thickening which likely reflects a combination of postoperative changes with superimposed developing cellulitis/phlegmon. 2. No abscess on the current study. 3. Additional postoperative gas locules in the subcutaneous tissues adjacent to the sternocleidomastoid muscles without significant associated stranding. 4. No cervical lymphadenopathy. Electronically signed by: Donnice Mania MD 11/13/2023 02:17 PM EDT RP Workstation: [id number]   Assessment/Plan Principal problem Cellulitis, early phlegmon, postoperative infection -patient will be admitted to the hospital, general surgery consulted by EDP.  Will continue broad-spectrum antibiotics, blood culture sent and pending - Monitor clinically, trend fever curve and leukocytosis.  Appreciate surgical follow-up  Active problems OSA-not on CPAP  Hyperlipidemia-does not appear to be on medications  Obesity, class II-BMI 35.1.  Addendum: 3:50 PM, I was  notified that after receiving her ceftriaxone  patient has developed few areas of raised lesions on her back, consistent with hives.  She has a history of alpha gal allergy, especially with red meat, and this reminds her of that.  She denies having any exposure to her known allergies other than the ceftriaxone .  Discontinue this antibiotics, keep on linezolid alone for now, will give Solu-Medrol  x 1 as well as famotidine  x 3  DVT prophylaxis: SCDs, alpha-gal allerg present Code Status: Full code Family Communication: Husband at bedside Bed Type: MedSurg Consults called: General Surgery Obs/Inp: Observation  Nilda Fendt, MD, PhD [facility]  Contact via [email or web address]  11/13/2023, 3:22 PM

## 2023-11-13 NOTE — Progress Notes (Addendum)
 Subjective/Chief Complaint:  Pt admitted to medicine for cellulitis of neck.  Feels swelling and pain there as well as in left wrist.  Had whelps with ceftriaxone .     Objective: Vital signs in last 24 hours: Temp:  [98 F (36.7 C)-98.4 F (36.9 C)] 98.1 F (36.7 C) (10/19 1612) Pulse Rate:  [100-115] 103 (10/19 1612) Resp:  [17-19] 18 (10/19 1612) BP: (139-160)/(105-108) 142/106 (10/19 1612) SpO2:  [98 %-99 %] 98 % (10/19 1612) Weight:  [84.4 kg] 84.4 kg (10/19 1058)    Intake/Output from previous day: No intake/output data recorded. Intake/Output this shift: No intake/output data recorded.  General:  alert and oriented Neck:  right side of incision angry with some spitting of one vicryl knot.   Neck is soft other than incision/superficial tissues.    Lab Results:  Recent Labs    11/13/23 1141 11/13/23 1500  WBC 10.3  --   HGB 14.0 14.3  HCT 44.0 42.0  PLT 294  --    BMET Recent Labs    11/13/23 1141 11/13/23 1500  NA 144 139  K 3.3* 4.5  CL 104 106  CO2 26  --   GLUCOSE 83 75  BUN 10 10  CREATININE 0.73 0.70  CALCIUM 9.0  --    PT/INR Recent Labs    11/13/23 1141  LABPROT 13.2  INR 0.9   ABG No results for input(s): PHART, HCO3 in the last 72 hours.  Invalid input(s): PCO2, PO2  Studies/Results: CT Soft Tissue Neck W Contrast Result Date: 11/13/2023 EXAM: CT NECK WITH CONTRAST 11/13/2023 01:29:59 PM TECHNIQUE: CT of the neck was performed with the administration of 100 mL of iohexol  (OMNIPAQUE ) 300 MG/ML solution. Multiplanar reformatted images are provided for review. Automated exposure control, iterative reconstruction, and/or weight based adjustment of the mA/kV was utilized to reduce the radiation dose to as low as reasonably achievable. COMPARISON: Prominent CTA head and neck 18774 and thyroid  ultrasound 81425. CLINICAL HISTORY: Postoperative neck pain, redness, swelling, recent left thyroid  lobectomy. FINDINGS: AERODIGESTIVE  TRACT: The nasopharynx and oropharynx are symmetric. Normal appearance of the tonsils. The oral cavity and floor of mouth are unremarkable. Normal appearance of the supraglottic airway. The vocal folds are symmetric. No significant narrowing of the extrathoracic trachea adjacent to the thyroidectomy site. No discrete mass. No edema. SALIVARY GLANDS: The parotid and submandibular glands are unremarkable. THYROID : Interval postsurgical changes of left thyroidectomy. There is postoperative fluid in the thyroidectomy bed with adjacent surgical clips. Residual right thyroid  lobe is unremarkable in appearance. LYMPH NODES: No pathologically enlarged lymph nodes in the neck. SOFT TISSUES: Overlying stranding within the subcutaneous tissues. There is skin thickening along the anterior lower neck. There are areas of ill-defined fluid within the deeper subcutaneous tissues of the anterior neck extending towards the thyroidectomy bed. Few locules of gas which are favored to be postoperative in etiology. Findings concerning for postoperative changes with possible development of phlegmon/cellulitis. There is no peripherally enhancing fluid collection to suggest abscess formation. There are a few locules of gas noted extending superiorly and laterally within the subcutaneous tissues of the neck adjacent to the sternocleidomastoid muscles likely reflecting postoperative gas. There is no significantly associated stranding within this region. BRAIN, ORBITS, SINUSES AND MASTOIDS: No acute abnormality. LUNGS AND MEDIASTINUM: No acute abnormality. BONES: No focal bone abnormality. IMPRESSION: 1. Postoperative changes of left thyroidectomy. Adjacent subcutaneous stranding, ill-defined fluid, and skin thickening which likely reflects a combination of postoperative changes with superimposed developing cellulitis/phlegmon. 2. No  abscess on the current study. 3. Additional postoperative gas locules in the subcutaneous tissues adjacent to  the sternocleidomastoid muscles without significant associated stranding. 4. No cervical lymphadenopathy. Electronically signed by: Donnice Mania MD 11/13/2023 02:17 PM EDT RP Workstation: [id number]    Anti-infectives: Anti-infectives (From admission, onward)    Start     Dose/Rate Route Frequency Ordered Stop   11/13/23 2030  linezolid (ZYVOX) IVPB 600 mg        600 mg 300 mL/hr over 60 Minutes Intravenous Every 12 hours 11/13/23 1934     11/13/23 1445  cefTRIAXone  (ROCEPHIN ) 2 g in sodium chloride  0.9 % 100 mL IVPB        2 g 200 mL/hr over 30 Minutes Intravenous  Once 11/13/23 1439 11/13/23 1552   11/13/23 1445  linezolid (ZYVOX) IVPB 600 mg  Status:  Discontinued        600 mg 300 mL/hr over 60 Minutes Intravenous Every 12 hours 11/13/23 1439 11/13/23 1602       Assessment/Plan: s/p left thyroid  lobectomy/isthmusectomy 10/16 TG Cellulitis/irritation/suture reaction neck incision Left forearm superficial thrombophlebitis.  CT reports possible phlegmon of neck, but seems more superficial on exam.   Antibiotics per medical team. They have placed on linezolid.   Dr. Eletha to see tomorrow.  Warm compresses to arm   LOS: 0 days    Jina Nephew 11/13/2023

## 2023-11-13 NOTE — ED Notes (Signed)
 Pt reports hives on back after completion of rocephin .  Admitting provider at bedside

## 2023-11-13 NOTE — Telephone Encounter (Signed)
 Patient called the on-call physician yesterday with swelling and redness at her IV site.  She was started on Keflex .  She was instructed to drawl the extent of redness on her arm.    She calls back today with increasing redness and no improvement in swelling.  She is advised to go to the emergency department for evaluation to assess the level of cellulitis associated with the presumed thrombophlebitis.  She also is at risk for developing septic thrombophlebitis.  Additionally, she does mention some swelling and potential redness at her neck incision.  She is advised that her neck incision can be evaluated as well at the emergency department.

## 2023-11-13 NOTE — ED Provider Notes (Signed)
 [place] EMERGENCY DEPARTMENT AT [facility] Provider Note   CSN: [id number] Arrival date & time: 11/13/23  1039     History Chief Complaint  Patient presents with   Post-op Problem    HPI: Kayla Sexton is a 43 y.o. female with history pertinent for recent left thyroid  lobectomy for elevated BMI, OSA, ADD, PCOS, prediabetes, dyslipidemia, GERD, dysphagia, prior left-sided Bell's palsy, hypothyroidism, thyroid  nodules who presents complaining of multiple complaints. Patient arrived via walk-in.  History provided by patient.  No interpreter required during this encounter.  Patient reports that she is postop day 3 from a left partial thyroidectomy with Dr. Eletha at [facility].  Reports that she was discharged 2 days ago, however since then she has developed swelling of her left arm, which she reports that she called her surgeon about, and they prescribed a course of Keflex , which she has taken, however did not have significant change, though notes that area of swelling/erythema is not progressing since initiating the Keflex .  Also reports that she has warmth, erythema, pain around her incision.  Reports that she overall has malaise and fatigue, and was told that if she had symptoms of feeling abnormal she should come to the emergency department for her calcium level to be checked.  Denies any fever, chills, chest pain, shortness of breath, nausea, vomiting, diarrhea.  Patient's recorded medical, surgical, social, medication list and allergies were reviewed in the Snapshot window as part of the initial history.   Prior to Admission medications   Medication Sig Start Date End Date Taking? Authorizing Provider  ALEVE 220 MG tablet Take 220-440 mg by mouth 2 (two) times daily as needed (for pain).   Yes [provider]  cyclobenzaprine  (FLEXERIL ) 5 MG tablet Take 5 mg by mouth daily as needed for muscle spasms. 12/29/22  Yes [provider]  famotidine  (PEPCID ) 20  MG tablet TAKE 1 TABLET BY MOUTH TWICE A DAY 01/23/23  Yes Rilla Baller, MD  oxyCODONE  (OXY IR/ROXICODONE ) 5 MG immediate release tablet Take 1-2 tablets (5-10 mg total) by mouth every 6 (six) hours as needed for moderate pain (pain score 4-6). 11/11/23  Yes Eletha Boas, MD  amoxicillin -clavulanate (AUGMENTIN ) 875-125 MG tablet Take 1 tablet by mouth every 12 (twelve) hours for 5 days. 11/18/23 11/23/23  Odell Celinda Balo, MD  gabapentin  (NEURONTIN ) 300 MG capsule Take 1 capsule (300 mg total) by mouth at bedtime. 11/21/23   Rilla Baller, MD  linezolid (ZYVOX) 600 MG tablet Take 1 tablet (600 mg total) by mouth every 12 (twelve) hours for 5 days. 11/18/23 11/23/23  Fayette Bodily, MD  omeprazole (PRILOSEC) 40 MG capsule Take 1 capsule (40 mg total) by mouth daily. 11/21/23   Rilla Baller, MD     Allergies: Adhesive [tape], Alpha-gal, Other, Vancomycin , Bactrim  [sulfamethoxazole -trimethoprim ], Metformin and related, and Silicone   Review of Systems   ROS as per HPI  Physical Exam Updated Vital Signs BP (!) 144/99 (BP Location: Right Arm)   Pulse 89   Temp 98.3 F (36.8 C)   Resp 14   Ht 5' 1 (1.549 m)   Wt 84.4 kg   LMP 09/29/2020 (Approximate) Comment: PCOS  SpO2 95%   BMI 35.14 kg/m  Physical Exam Vitals and nursing note reviewed.  Constitutional:      General: She is not in acute distress.    Appearance: She is well-developed.  HENT:     Head: Normocephalic and atraumatic.  Eyes:     Conjunctiva/sclera: Conjunctivae  normal.  Neck:     Comments: Linear incision to low anterior neck with surrounding erythema, warmth, tenderness to palpation without dehiscence or purulent discharge, see image Cardiovascular:     Rate and Rhythm: Normal rate and regular rhythm.     Heart sounds: No murmur heard. Pulmonary:     Effort: Pulmonary effort is normal. No respiratory distress.     Breath sounds: Normal breath sounds.  Abdominal:     Palpations: Abdomen  is soft.     Tenderness: There is no abdominal tenderness.  Musculoskeletal:        General: No swelling.     Comments: Volar aspect of left forearm with slight erythema and slight swelling compared to right, see image  Skin:    General: Skin is warm and dry.     Capillary Refill: Capillary refill takes less than 2 seconds.  Neurological:     Mental Status: She is alert.  Psychiatric:        Mood and Affect: Mood normal.          ED Course/ Medical Decision Making/ A&P    Procedures Procedures   Medications Ordered in ED Medications  0.9 %  sodium chloride  infusion ( Intravenous New Bag/Given 11/15/23 1223)  iohexol  (OMNIPAQUE ) 300 MG/ML solution 100 mL (100 mLs Intravenous Contrast Given 11/13/23 1320)  cefTRIAXone  (ROCEPHIN ) 2 g in sodium chloride  0.9 % 100 mL IVPB (0 g Intravenous Stopped 11/13/23 1552)  methylPREDNISolone  sodium succinate (SOLU-MEDROL ) 40 mg/mL injection 40 mg (40 mg Intravenous Given 11/13/23 1624)  famotidine  (PEPCID ) IVPB 20 mg premix (20 mg Intravenous New Bag/Given 11/14/23 2144)  calcium gluconate 1 g/ 50 mL sodium chloride  IVPB (1,000 mg Intravenous New Bag/Given 11/13/23 2020)  white petrolatum (VASELINE) gel (1 Application Topical Given 11/14/23 1554)  docusate sodium (COLACE) capsule 100 mg (100 mg Oral Given 11/14/23 2156)  phenazopyridine  (PYRIDIUM ) tablet 100 mg (100 mg Oral Given 11/15/23 2142)    Medical Decision Making:   Kayla Sexton is a 43 y.o. female who presents for skin changes of the left arm as well as incisional pain as per above.  Physical exam is pertinent for erythema, swelling, induration of the operative incision of the neck, as well as slight erythema and swelling of the left forearm.   The differential includes but is not limited to cellulitis, abscess, postop infection, hypercalcemia, hypocalcemia.  Independent historian: None  External data reviewed: Notes: Reviewed patient's recent operative notes as well as  discharge notes from her hospitalization with general surgery for partial thyroidectomy  Labs: Ordered, Independent interpretation, and Details: CBC without leukocytosis, anemia, thrombocytopenia.Kayla  CMP without AKI, emergent electrolyte derangement, emergent LFT abnormality.  Lactic acid WNL.  UA with questionable UTI.  Coags WNL.  Free T4 WNL. TSH WNL.  Chem-8 with ionized calcium pending at the time of admission  Radiology: Ordered, Independent interpretation, Details: Personally reviewed the patient's CT of the neck, did appreciate hyperenhancement in the anterior neck in the area of patient's recent thyroidectomy, and All images reviewed independently.  Agree with radiology report at this time.   CT ABDOMEN PELVIS W CONTRAST Result Date: 11/21/2023 EXAM: CT ABDOMEN AND PELVIS WITH CONTRAST 11/21/2023 06:19:05 PM TECHNIQUE: CT of the abdomen and pelvis was performed with the administration of 100 mL of iohexol  (OMNIPAQUE ) 300 MG/ML solution. Multiplanar reformatted images are provided for review. Automated exposure control, iterative reconstruction, and/or weight-based adjustment of the mA/kV was utilized to reduce the radiation dose to as low as reasonably  achievable. COMPARISON: CT abdomen pelvis 11/14/2023. CLINICAL HISTORY: Appendicitis suspected. Right lower quadrant abdominal pain. FINDINGS: LOWER CHEST: No acute abnormality. LIVER: Borderline enlarged liver measuring up to 19 Sexton. Otherwise The liver is unremarkable. GALLBLADDER AND BILE DUCTS: Gallbladder is unremarkable. No biliary ductal dilatation. SPLEEN: Tiny splenule noted. The spleen is unremarkable. PANCREAS: No acute abnormality. ADRENAL GLANDS: No acute abnormality. KIDNEYS, URETERS AND BLADDER: There is a nonobstructive 9 mm left nephrolithiasis. No right nephrolithiasis. No ureterolithiasis bilaterally. No hydroureteronephrosis. No perinephric or periureteral stranding. Urinary bladder is unremarkable. GI AND BOWEL: Stomach demonstrates  no acute abnormality. No small or large bowel wall thickening or dilatation. The appendix is unremarkable. PERITONEUM AND RETROPERITONEUM: No ascites. No free air. VASCULATURE: Aorta is normal in caliber. LYMPH NODES: No lymphadenopathy. REPRODUCTIVE ORGANS: Status post hysterectomy. No adnexal masses bilaterally. BONES AND SOFT TISSUES: No acute osseous abnormality. No focal soft tissue abnormality. IMPRESSION: 1. Nonobstructive 9 mm left nephrolithiasis. Electronically signed by: Morgane Naveau MD 11/21/2023 07:03 PM EDT RP Workstation: [id number]   CT RENAL STONE STUDY Result Date: 11/14/2023 CLINICAL DATA:  Abdominal/flank pain. EXAM: CT ABDOMEN AND PELVIS WITHOUT CONTRAST TECHNIQUE: Multidetector CT imaging of the abdomen and pelvis was performed following the standard protocol without IV contrast. RADIATION DOSE REDUCTION: This exam was performed according to the departmental dose-optimization program which includes automated exposure control, adjustment of the mA and/or kV according to patient size and/or use of iterative reconstruction technique. COMPARISON:  03/21/2023 FINDINGS: Lower chest: Normal-sized heart.  Minimal bibasilar atelectasis. Hepatobiliary: No focal liver abnormality is seen. No gallstones, gallbladder wall thickening, or biliary dilatation. Pancreas: Unremarkable. No pancreatic ductal dilatation or surrounding inflammatory changes. Spleen: Normal in size without focal abnormality. Adrenals/Urinary Tract: Normal-appearing adrenal glands. 9 mm left renal pelvis calculus. 2 mm lower pole left renal calculus and 1 mm upper pole left renal calculus. 1 mm lower pole right renal calculus. Normal-appearing ureters and urinary bladder. No hydronephrosis. Stomach/Bowel: 7 mm linear appendicoliths in the mid to distal appendix without evidence of appendicitis. Unremarkable stomach, small bowel and colon. Vascular/Lymphatic: No significant vascular findings are present. No enlarged abdominal or  pelvic lymph nodes. Reproductive: Status post hysterectomy. No adnexal masses. Other: No abdominal wall hernia or abnormality. No abdominopelvic ascites. Musculoskeletal: Minimal lumbar and mild lower thoracic spine degenerative changes. IMPRESSION: 1. 9 mm left renal pelvis calculus without hydronephrosis. 2. Additional small bilateral renal calculi. 3. 7 mm linear appendicolith without evidence of appendicitis. Electronically Signed   By: Elspeth Bathe M.D.   On: 11/14/2023 14:46   CT Soft Tissue Neck W Contrast Result Date: 11/13/2023 EXAM: CT NECK WITH CONTRAST 11/13/2023 01:29:59 PM TECHNIQUE: CT of the neck was performed with the administration of 100 mL of iohexol  (OMNIPAQUE ) 300 MG/ML solution. Multiplanar reformatted images are provided for review. Automated exposure control, iterative reconstruction, and/or weight based adjustment of the mA/kV was utilized to reduce the radiation dose to as low as reasonably achievable. COMPARISON: Prominent CTA head and neck 18774 and thyroid  ultrasound 81425. CLINICAL HISTORY: Postoperative neck pain, redness, swelling, recent left thyroid  lobectomy. FINDINGS: AERODIGESTIVE TRACT: The nasopharynx and oropharynx are symmetric. Normal appearance of the tonsils. The oral cavity and floor of mouth are unremarkable. Normal appearance of the supraglottic airway. The vocal folds are symmetric. No significant narrowing of the extrathoracic trachea adjacent to the thyroidectomy site. No discrete mass. No edema. SALIVARY GLANDS: The parotid and submandibular glands are unremarkable. THYROID : Interval postsurgical changes of left thyroidectomy. There is postoperative fluid in the thyroidectomy bed  with adjacent surgical clips. Residual right thyroid  lobe is unremarkable in appearance. LYMPH NODES: No pathologically enlarged lymph nodes in the neck. SOFT TISSUES: Overlying stranding within the subcutaneous tissues. There is skin thickening along the anterior lower neck. There are  areas of ill-defined fluid within the deeper subcutaneous tissues of the anterior neck extending towards the thyroidectomy bed. Few locules of gas which are favored to be postoperative in etiology. Findings concerning for postoperative changes with possible development of phlegmon/cellulitis. There is no peripherally enhancing fluid collection to suggest abscess formation. There are a few locules of gas noted extending superiorly and laterally within the subcutaneous tissues of the neck adjacent to the sternocleidomastoid muscles likely reflecting postoperative gas. There is no significantly associated stranding within this region. BRAIN, ORBITS, SINUSES AND MASTOIDS: No acute abnormality. LUNGS AND MEDIASTINUM: No acute abnormality. BONES: No focal bone abnormality. IMPRESSION: 1. Postoperative changes of left thyroidectomy. Adjacent subcutaneous stranding, ill-defined fluid, and skin thickening which likely reflects a combination of postoperative changes with superimposed developing cellulitis/phlegmon. 2. No abscess on the current study. 3. Additional postoperative gas locules in the subcutaneous tissues adjacent to the sternocleidomastoid muscles without significant associated stranding. 4. No cervical lymphadenopathy. Electronically signed by: Donnice Mania MD 11/13/2023 02:17 PM EDT RP Workstation: [id number]    EKG/Medicine tests: Not indicated EKG Interpretation:                  Interventions: Ceftriaxone   See the EMR for full details regarding lab and imaging results.  Patient presents to the emergency department for malaise after recent surgery, also has evidence of cellulitis on anterior neck incision as well as possible early developing cellulitis on left forearm which has halted progression since starting Keflex  outpatient.  Labs and imaging are indicated, blood cultures obtained given possible operative infection.  Labs obtained and overall reassuring, however CT obtained and demonstrates  possible developing phlegmon, therefore do feel that patient warrants admission for IV antibiotics for monitoring in case phlegmon progresses to abscess.  I did consult general surgery and spoke with Dr. Aron who recommends medicine admission.  Hospitalist consulted, discussed with Dr. Trixie who accepted the patient to their service.  Presentation is most consistent with acute complicated illness  Discussion of management or test interpretations with external provider(s): Dr. Trixie, hospitalist, Dr. Aron, general surgery  Risk Drugs:Prescription drug management Treatment: Decision regarding hospitalization  Disposition: ADMIT: I believe the patient requires admission for further care and management. The patient was admitted to hospitalist with general surgery consult. Please see inpatient provider note for additional treatment plan details.   MDM generated using voice dictation software and may contain dictation errors.  Please contact me for any clarification or with any questions.  Clinical Impression:  1. Infection of organ or organ space after surgery, initial encounter      Admit   Final Clinical Impression(s) / ED Diagnoses Final diagnoses:  Infection of organ or organ space after surgery, initial encounter    Rx / DC Orders ED Discharge Orders          Ordered    linezolid (ZYVOX) 600 MG tablet  Every 12 hours,   Status:  Discontinued        11/18/23 1025    Increase activity slowly        11/18/23 1025    Diet - low sodium heart healthy        11/18/23 1025    No wound care        11/18/23  1025    amoxicillin -clavulanate (AUGMENTIN ) 875-125 MG tablet  Every 12 hours        11/18/23 1026    linezolid (ZYVOX) 600 MG tablet  Every 12 hours        11/18/23 1042             Rogelia Jerilynn RAMAN, MD 11/22/23 1119

## 2023-11-13 NOTE — ED Triage Notes (Signed)
 Patient to ED by POV with c/o left arm pain in swelling and swelling to surgical scar to throat. Thursday she had thyroid  surgery here at [facility].

## 2023-11-14 ENCOUNTER — Ambulatory Visit: Payer: Self-pay | Admitting: Surgery

## 2023-11-14 ENCOUNTER — Observation Stay (HOSPITAL_COMMUNITY)

## 2023-11-14 DIAGNOSIS — L03221 Cellulitis of neck: Secondary | ICD-10-CM

## 2023-11-14 LAB — BASIC METABOLIC PANEL WITH GFR
Anion gap: 12 (ref 5–15)
BUN: 9 mg/dL (ref 6–20)
CO2: 26 mmol/L (ref 22–32)
Calcium: 9.1 mg/dL (ref 8.9–10.3)
Chloride: 102 mmol/L (ref 98–111)
Creatinine, Ser: 0.75 mg/dL (ref 0.44–1.00)
GFR, Estimated: >60 mL/min (ref >60)
Glucose, Bld: 169 mg/dL — ABNORMAL HIGH (ref 70–99)
Potassium: 4.2 mmol/L (ref 3.5–5.1)
Sodium: 140 mmol/L (ref 135–145)

## 2023-11-14 LAB — CBC WITH DIFFERENTIAL/PLATELET
Abs Immature Granulocytes: 0.05 K/uL (ref 0.00–0.07)
Basophils Absolute: 0 K/uL (ref 0.0–0.1)
Basophils Relative: 0 %
Eosinophils Absolute: 0 K/uL (ref 0.0–0.5)
Eosinophils Relative: 0 %
HCT: 43.5 % (ref 36.0–46.0)
Hemoglobin: 14 g/dL (ref 12.0–15.0)
Immature Granulocytes: 1 %
Lymphocytes Relative: 15 %
Lymphs Abs: 1.6 K/uL (ref 0.7–4.0)
MCH: 28.9 pg (ref 26.0–34.0)
MCHC: 32.2 g/dL (ref 30.0–36.0)
MCV: 89.7 fL (ref 80.0–100.0)
Monocytes Absolute: 0.5 K/uL (ref 0.1–1.0)
Monocytes Relative: 4 %
Neutro Abs: 8.8 K/uL — ABNORMAL HIGH (ref 1.7–7.7)
Neutrophils Relative %: 80 %
Platelets: 341 K/uL (ref 150–400)
RBC: 4.85 MIL/uL (ref 3.87–5.11)
RDW: 12.8 % (ref 11.5–15.5)
WBC: 11 K/uL — ABNORMAL HIGH (ref 4.0–10.5)
nRBC: 0 % (ref 0.0–0.2)

## 2023-11-14 LAB — HIV ANTIBODY (ROUTINE TESTING W REFLEX): HIV Screen 4th Generation wRfx: NONREACTIVE

## 2023-11-14 LAB — SURGICAL PATHOLOGY

## 2023-11-14 MED ORDER — WHITE PETROLATUM EX OINT
TOPICAL_OINTMENT | Freq: Once | CUTANEOUS | Status: AC
  Administered 2023-11-14: 1 via TOPICAL
  Filled 2023-11-14: qty 5

## 2023-11-14 MED ORDER — ASPIRIN 325 MG PO TABS
325.0000 mg | ORAL_TABLET | Freq: Every day | ORAL | Status: DC
  Administered 2023-11-14 – 2023-11-18 (×5): 325 mg via ORAL
  Filled 2023-11-14 (×5): qty 1

## 2023-11-14 MED ORDER — DOCUSATE SODIUM 100 MG PO CAPS
100.0000 mg | ORAL_CAPSULE | Freq: Once | ORAL | Status: AC
  Administered 2023-11-14: 100 mg via ORAL
  Filled 2023-11-14: qty 1

## 2023-11-14 NOTE — Progress Notes (Signed)
 Patient is upset because of antibiotics were given late and postponed because as I was about to hang the antibiotics, CT came in and she had to do a renal CT scan. Antibiotics were given 2 hours later after she came back, not 6 hours later as patient states. I went to her room as soon as I possibly could and informed her ahead of time that I was on my way. Patient also concerned about her UTI and kidney stones, I told her that MD Eletha was aware of this. She was wondering about when her results were received, although MD Gerkin came in and spoke to her in the afternoon.  Patient emotional and frustrated at the situation today. Charge nurse Chiquita and Kathrine were notified of the situation.

## 2023-11-14 NOTE — Progress Notes (Signed)
 Final pathology is all benign.  Good news.  tmg  [name], MD [facility] practice Office: [phone]

## 2023-11-14 NOTE — Plan of Care (Signed)
  Problem: Pain Managment: Goal: General experience of comfort will improve and/or be controlled Outcome: Progressing

## 2023-11-14 NOTE — Progress Notes (Signed)
 Assessment & Plan: POD#4 / HD#2 - thrombophlebitis with cellulitis left arm, status post left thyroid  lobectomy - regular diet - IV abx's (Zyvox) per medical team - would add ASA 325mg  daily for thrombophlebitis - Kpad to left arm for thrombophlebitis - will use Vaseline to remove Dermabond from incision - may be contributing to local reaction - will follow with you; appreciate medical service management        [name], MD [facility] practice Office: [phone]        Chief Complaint: Thrombophlebitis left upper extremity, cellulitis of neck at surgical site  Subjective: Patient in bed, comfortable, eating lunch  Objective: Vital signs in last 24 hours: Temp:  [97.6 F (36.4 C)-98.4 F (36.9 C)] 97.8 F (36.6 C) (10/20 1001) Pulse Rate:  [65-103] 94 (10/20 1001) Resp:  [15-18] 18 (10/20 1001) BP: (119-150)/(63-106) 150/94 (10/20 1001) SpO2:  [94 %-100 %] 98 % (10/20 1001)    Intake/Output from previous day: 10/19 0701 - 10/20 0700 In: 821.4 [P.O.:720; IV Piggyback:101.4] Out: -  Intake/Output this shift: No intake/output data recorded.  Physical Exam: HEENT - sclerae clear, mucous membranes moist Neck - wound dry and intact; moderate soft tissue swelling, some erythema, mild tenderness Ext - palpable cord with tenderness left forearm  Lab Results:  Recent Labs    11/13/23 1141 11/13/23 1500 11/14/23 0421  WBC 10.3  --  11.0*  HGB 14.0 14.3 14.0  HCT 44.0 42.0 43.5  PLT 294  --  341   BMET Recent Labs    11/13/23 1141 11/13/23 1500 11/14/23 0421  NA 144 139 140  K 3.3* 4.5 4.2  CL 104 106 102  CO2 26  --  26  GLUCOSE 83 75 169*  BUN 10 10 9   CREATININE 0.73 0.70 0.75  CALCIUM 9.0  --  9.1   PT/INR Recent Labs    11/13/23 1141  LABPROT 13.2  INR 0.9   Comprehensive Metabolic Panel:    Component Value Date/Time   NA 140 11/14/2023 0421   NA 139 11/13/2023 1500   NA 140 01/08/2020 1547   NA 147 (H)  11/14/2019 0000   K 4.2 11/14/2023 0421   K 4.5 11/13/2023 1500   CL 102 11/14/2023 0421   CL 106 11/13/2023 1500   CO2 26 11/14/2023 0421   CO2 26 11/13/2023 1141   BUN 9 11/14/2023 0421   BUN 10 11/13/2023 1500   BUN 11 01/08/2020 1547   BUN 10 11/14/2019 0000   CREATININE 0.75 11/14/2023 0421   CREATININE 0.70 11/13/2023 1500   CREATININE 0.74 08/24/2016 1534   GLUCOSE 169 (H) 11/14/2023 0421   GLUCOSE 75 11/13/2023 1500   CALCIUM 9.1 11/14/2023 0421   CALCIUM 9.0 11/13/2023 1141   AST 17 11/13/2023 1141   AST 26 09/07/2023 0620   ALT 15 11/13/2023 1141   ALT 28 09/07/2023 0620   ALKPHOS 90 11/13/2023 1141   ALKPHOS 56 09/07/2023 0620   BILITOT 0.9 11/13/2023 1141   BILITOT 1.5 (H) 09/07/2023 0620   BILITOT 0.7 03/08/2018 1654   PROT 6.9 11/13/2023 1141   PROT 6.8 09/09/2023 0000   PROT 7.6 03/08/2018 1654   ALBUMIN 4.1 11/13/2023 1141   ALBUMIN 3.4 (L) 09/07/2023 0620   ALBUMIN 4.5 03/08/2018 1654    Studies/Results: CT Soft Tissue Neck W Contrast Result Date: 11/13/2023 EXAM: CT NECK WITH CONTRAST 11/13/2023 01:29:59 PM TECHNIQUE: CT of the neck was performed with the administration of  100 mL of iohexol  (OMNIPAQUE ) 300 MG/ML solution. Multiplanar reformatted images are provided for review. Automated exposure control, iterative reconstruction, and/or weight based adjustment of the mA/kV was utilized to reduce the radiation dose to as low as reasonably achievable. COMPARISON: Prominent CTA head and neck 18774 and thyroid  ultrasound 81425. CLINICAL HISTORY: Postoperative neck pain, redness, swelling, recent left thyroid  lobectomy. FINDINGS: AERODIGESTIVE TRACT: The nasopharynx and oropharynx are symmetric. Normal appearance of the tonsils. The oral cavity and floor of mouth are unremarkable. Normal appearance of the supraglottic airway. The vocal folds are symmetric. No significant narrowing of the extrathoracic trachea adjacent to the thyroidectomy site. No discrete mass. No  edema. SALIVARY GLANDS: The parotid and submandibular glands are unremarkable. THYROID : Interval postsurgical changes of left thyroidectomy. There is postoperative fluid in the thyroidectomy bed with adjacent surgical clips. Residual right thyroid  lobe is unremarkable in appearance. LYMPH NODES: No pathologically enlarged lymph nodes in the neck. SOFT TISSUES: Overlying stranding within the subcutaneous tissues. There is skin thickening along the anterior lower neck. There are areas of ill-defined fluid within the deeper subcutaneous tissues of the anterior neck extending towards the thyroidectomy bed. Few locules of gas which are favored to be postoperative in etiology. Findings concerning for postoperative changes with possible development of phlegmon/cellulitis. There is no peripherally enhancing fluid collection to suggest abscess formation. There are a few locules of gas noted extending superiorly and laterally within the subcutaneous tissues of the neck adjacent to the sternocleidomastoid muscles likely reflecting postoperative gas. There is no significantly associated stranding within this region. BRAIN, ORBITS, SINUSES AND MASTOIDS: No acute abnormality. LUNGS AND MEDIASTINUM: No acute abnormality. BONES: No focal bone abnormality. IMPRESSION: 1. Postoperative changes of left thyroidectomy. Adjacent subcutaneous stranding, ill-defined fluid, and skin thickening which likely reflects a combination of postoperative changes with superimposed developing cellulitis/phlegmon. 2. No abscess on the current study. 3. Additional postoperative gas locules in the subcutaneous tissues adjacent to the sternocleidomastoid muscles without significant associated stranding. 4. No cervical lymphadenopathy. Electronically signed by: Donnice Mania MD 11/13/2023 02:17 PM EDT RP Workstation: [id number]      [name] 11/14/2023  Patient ID: Kayla Sexton, female   DOB: [birth year], 43 y.o.   MRN: [id number]

## 2023-11-14 NOTE — Progress Notes (Signed)
 Patient educated on importance of leaving the peeling incision site from the skin glue on her throat alone to prevent infection and bleeding

## 2023-11-14 NOTE — Progress Notes (Signed)
   11/14/23 1238  TOC Brief Assessment  Insurance and Status Reviewed  Patient has primary care physician Yes  Home environment has been reviewed resides in a private residence  Prior level of function: Independent  Prior/Current Home Services No current home services  Social Drivers of Health Review SDOH reviewed no interventions necessary  Readmission risk has been reviewed Yes  Transition of care needs no transition of care needs at this time

## 2023-11-14 NOTE — Progress Notes (Addendum)
 PROGRESS NOTE    Kayla Sexton  FMW:[id number] DOB: 07-17-1980 DOA: 11/13/2023 PCP: Rilla Baller, MD   Brief Narrative:   43 y.o. female with medical history significant of multiple thyroid  nodules, bothering her, status post left thyroid  lobectomy and isthmusectomy 10/16 by Dr. Eletha, CIN 3, history of kidney stones, alpha gal hypersensitivity, prediabetes, comes to the hospital on postoperative day 3 for increased left arm swelling where she had an IV while she was hospitalized, also increased pain, swelling and redness at the thyroidectomy surgical site. Surgery consulted and she is on IV antibiotics.   Assessment & Plan:  Principal Problem:   Cellulitis   Neck Cellulitis, early phlegmon, postoperative infection: -s/p left thyroid  lobectomy/isthmusectomy 10/16  -Surgery on board - Continue with antibiotics. -Erythema has improved. No discharge noted this morning.  Left forearm superficial thrombophlebitis: -Continue current management  Recurrent UTI in the setting of b/l nephrolithaisis,POA: -Ordered CT renal study -Outpatient f/u with urology on Thursday.  Hypocalcemia: Ordered repeat calcium levels to be checked.    OSA-not on CPAP   Hyperlipidemia-outpatient f/u   Obesity, class II-BMI 35.1. Advised regarding dietary modifications and exercise to facilitate weight loss.  Disposition: Home   DVT prophylaxis: SCDs Start: 11/13/23 1603     Code Status: Full Code Family Communication:   Status is: Observation The patient remains OBS appropriate and will d/c before 2 midnights.    Subjective:  She told me about her h/o renal stones and she has an upcoming appointment with [facility] urology this Thursday. She is complaining of burning sensation when she urinates and is concerned about another UTI. She wants a CT renal study done.   Examination:  General exam: Appears calm and comfortable  Respiratory system: Clear to auscultation. Respiratory effort  normal. Cardiovascular system: S1 & S2 heard, RRR. No JVD, murmurs, rubs, gallops or clicks. No pedal edema. Gastrointestinal system: Abdomen is nondistended, soft and nontender. No organomegaly or masses felt. Normal bowel sounds heard. Central nervous system: Alert and oriented. No focal neurological deficits. Extremities: Symmetric 5 x 5 power. Skin: Slight erythema surrounding the thyroid  surgery site, no discharge. Psychiatry: Judgement and insight appear normal. Mood & affect appropriate.   Diet Orders (From admission, onward)     Start     Ordered   11/13/23 1603  Diet regular Room service appropriate? Yes; Fluid consistency: Thin  Diet effective now       Question Answer Comment  Room service appropriate? Yes   Fluid consistency: Thin      11/13/23 1602            Objective: Vitals:   11/13/23 1612 11/13/23 2214 11/14/23 0211 11/14/23 0558  BP: (!) 142/106 (!) 126/90 122/80 119/63  Pulse: (!) 103 97 80 65  Resp: 18 17 16 15   Temp: 98.1 F (36.7 C) 97.9 F (36.6 C) 97.6 F (36.4 C) 97.6 F (36.4 C)  TempSrc: Oral Oral  Oral  SpO2: 98% 94% 100% 98%  Weight:      Height:        Intake/Output Summary (Last 24 hours) at 11/14/2023 0852 Last data filed at 11/14/2023 0600 Gross per 24 hour  Intake 821.42 ml  Output --  Net 821.42 ml   Filed Weights   11/13/23 1058  Weight: 84.4 kg    Scheduled Meds: Continuous Infusions:  famotidine  (PEPCID ) IV 20 mg (11/13/23 2224)   linezolid (ZYVOX) IV 600 mg (11/13/23 2115)    Nutritional status     Body mass  index is 35.14 kg/m.  Data Reviewed:   CBC: Recent Labs  Lab 11/10/23 1009 11/13/23 1141 11/13/23 1500  WBC 8.2 10.3  --   NEUTROABS  --  4.8  --   HGB 14.6 14.0 14.3  HCT 46.5* 44.0 42.0  MCV 88.7 87.8  --   PLT 332 294  --    Basic Metabolic Panel: Recent Labs  Lab 11/13/23 1141 11/13/23 1500  NA 144 139  K 3.3* 4.5  CL 104 106  CO2 26  --   GLUCOSE 83 75  BUN 10 10  CREATININE  0.73 0.70  CALCIUM 9.0  --    GFR: Estimated Creatinine Clearance: 89.3 mL/min (by C-G formula based on SCr of 0.7 mg/dL). Liver Function Tests: Recent Labs  Lab 11/13/23 1141  AST 17  ALT 15  ALKPHOS 90  BILITOT 0.9  PROT 6.9  ALBUMIN 4.1   No results for input(s): LIPASE, AMYLASE in the last 168 hours. No results for input(s): AMMONIA in the last 168 hours. Coagulation Profile: Recent Labs  Lab 11/13/23 1141  INR 0.9   Cardiac Enzymes: No results for input(s): CKTOTAL, CKMB, CKMBINDEX, TROPONINI in the last 168 hours. BNP (last 3 results) No results for input(s): PROBNP in the last 8760 hours. HbA1C: No results for input(s): HGBA1C in the last 72 hours. CBG: No results for input(s): GLUCAP in the last 168 hours. Lipid Profile: No results for input(s): CHOL, HDL, LDLCALC, TRIG, CHOLHDL, LDLDIRECT in the last 72 hours. Thyroid  Function Tests: Recent Labs    11/13/23 1141 11/13/23 1142  TSH 0.884  --   FREET4  --  1.06   Anemia Panel: No results for input(s): VITAMINB12, FOLATE, FERRITIN, TIBC, IRON, RETICCTPCT in the last 72 hours. Sepsis Labs: Recent Labs  Lab 11/13/23 1108 11/13/23 1326  LATICACIDVEN 1.5 1.4    Recent Results (from the past 240 hours)  Culture, blood (Routine x 2)     Status: None (Preliminary result)   Collection Time: 11/13/23 11:43 AM   Specimen: BLOOD LEFT HAND  Result Value Ref Range Status   Specimen Description   Final    BLOOD LEFT HAND Performed at [facility], [address], [place], [place] [address]    Special Requests   Final    BOTTLES DRAWN AEROBIC AND ANAEROBIC Blood Culture results may not be optimal due to an inadequate volume of blood received in culture bottles Performed at [facility], [address], [place], [place] [address]    Culture   Final    NO GROWTH < 12 HOURS Performed at [facility], [address], [place], [place]  [address]    Report Status PENDING  Incomplete  Culture, blood (Routine x 2)     Status: None (Preliminary result)   Collection Time: 11/13/23  5:08 PM   Specimen: BLOOD LEFT ARM  Result Value Ref Range Status   Specimen Description   Final    BLOOD LEFT ARM Performed at [facility], [address], [place], [place] [address]    Special Requests   Final    BOTTLES DRAWN AEROBIC AND ANAEROBIC Blood Culture results may not be optimal due to an inadequate volume of blood received in culture bottles Performed at [facility], [address] [place], [place] [address]    Culture   Final    NO GROWTH < 12 HOURS Performed at [facility], [address]  [place], [place] [address]    Report Status PENDING  Incomplete         Radiology Studies: CT Soft Tissue Neck W Contrast Result Date: 11/13/2023 EXAM: CT NECK WITH CONTRAST 11/13/2023 01:29:59 PM TECHNIQUE: CT of the neck was performed with the administration of 100 mL of iohexol  (OMNIPAQUE ) 300 MG/ML solution. Multiplanar reformatted images are provided for review. Automated exposure control, iterative reconstruction, and/or weight based adjustment of the mA/kV was utilized to reduce the radiation dose to as low as reasonably achievable. COMPARISON: Prominent CTA head and neck 18774 and thyroid  ultrasound 81425. CLINICAL HISTORY: Postoperative neck pain, redness, swelling, recent left thyroid  lobectomy. FINDINGS: AERODIGESTIVE TRACT: The nasopharynx and oropharynx are symmetric. Normal appearance of the tonsils. The oral cavity and floor of mouth are unremarkable. Normal appearance of the supraglottic airway. The vocal folds are symmetric. No significant narrowing of the extrathoracic trachea adjacent to the thyroidectomy site. No discrete mass. No edema. SALIVARY GLANDS: The parotid and submandibular glands are unremarkable. THYROID : Interval postsurgical changes of left thyroidectomy. There is postoperative  fluid in the thyroidectomy bed with adjacent surgical clips. Residual right thyroid  lobe is unremarkable in appearance. LYMPH NODES: No pathologically enlarged lymph nodes in the neck. SOFT TISSUES: Overlying stranding within the subcutaneous tissues. There is skin thickening along the anterior lower neck. There are areas of ill-defined fluid within the deeper subcutaneous tissues of the anterior neck extending towards the thyroidectomy bed. Few locules of gas which are favored to be postoperative in etiology. Findings concerning for postoperative changes with possible development of phlegmon/cellulitis. There is no peripherally enhancing fluid collection to suggest abscess formation. There are a few locules of gas noted extending superiorly and laterally within the subcutaneous tissues of the neck adjacent to the sternocleidomastoid muscles likely reflecting postoperative gas. There is no significantly associated stranding within this region. BRAIN, ORBITS, SINUSES AND MASTOIDS: No acute abnormality. LUNGS AND MEDIASTINUM: No acute abnormality. BONES: No focal bone abnormality. IMPRESSION: 1. Postoperative changes of left thyroidectomy. Adjacent subcutaneous stranding, ill-defined fluid, and skin thickening which likely reflects a combination of postoperative changes with superimposed developing cellulitis/phlegmon. 2. No abscess on the current study. 3. Additional postoperative gas locules in the subcutaneous tissues adjacent to the sternocleidomastoid muscles without significant associated stranding. 4. No cervical lymphadenopathy. Electronically signed by: Donnice Mania MD 11/13/2023 02:17 PM EDT RP Workstation: [id number]           LOS: 0 days   Time spent= 40 mins    Deliliah Room, MD [facility]  If 7PM-7AM, please contact night-coverage  11/14/2023, 8:52 AM

## 2023-11-15 ENCOUNTER — Telehealth (HOSPITAL_COMMUNITY): Payer: Self-pay | Admitting: Pharmacy Technician

## 2023-11-15 ENCOUNTER — Other Ambulatory Visit (HOSPITAL_COMMUNITY): Payer: Self-pay

## 2023-11-15 ENCOUNTER — Encounter (HOSPITAL_COMMUNITY): Payer: Self-pay | Admitting: Internal Medicine

## 2023-11-15 DIAGNOSIS — L03221 Cellulitis of neck: Secondary | ICD-10-CM | POA: Diagnosis not present

## 2023-11-15 LAB — URINE CULTURE: Culture: NO GROWTH

## 2023-11-15 MED ORDER — POLYETHYLENE GLYCOL 3350 17 G PO PACK
17.0000 g | PACK | Freq: Every day | ORAL | Status: DC
  Administered 2023-11-15 – 2023-11-17 (×3): 17 g via ORAL
  Filled 2023-11-15 (×4): qty 1

## 2023-11-15 MED ORDER — SODIUM CHLORIDE 0.9 % IV SOLN: INTRAVENOUS | Status: AC

## 2023-11-15 MED ORDER — MAGNESIUM HYDROXIDE 400 MG/5ML PO SUSP: 15.0000 mL | Freq: Every day | ORAL | Status: DC | PRN

## 2023-11-15 MED ORDER — FAMOTIDINE 20 MG PO TABS
20.0000 mg | ORAL_TABLET | Freq: Two times a day (BID) | ORAL | Status: DC
  Administered 2023-11-15 – 2023-11-18 (×7): 20 mg via ORAL
  Filled 2023-11-15 (×7): qty 1

## 2023-11-15 MED ORDER — PHENAZOPYRIDINE HCL 100 MG PO TABS
100.0000 mg | ORAL_TABLET | Freq: Once | ORAL | Status: AC
  Administered 2023-11-15: 100 mg via ORAL
  Filled 2023-11-15: qty 1

## 2023-11-15 MED ORDER — SENNOSIDES-DOCUSATE SODIUM 8.6-50 MG PO TABS
1.0000 | ORAL_TABLET | Freq: Two times a day (BID) | ORAL | Status: DC
  Administered 2023-11-15 – 2023-11-18 (×6): 1 via ORAL
  Filled 2023-11-15 (×7): qty 1

## 2023-11-15 MED ORDER — BISACODYL 5 MG PO TBEC: 5.0000 mg | DELAYED_RELEASE_TABLET | Freq: Every day | ORAL | Status: DC | PRN

## 2023-11-15 NOTE — Telephone Encounter (Signed)
 Patient Product/process development scientist completed.    The patient is insured through [employer] [facility]. Patient has [employer], may use a copay card, and/or apply for patient assistance if available.    Ran test claim for Linezolid 600 mg and the current 14 day co-pay is $0.00.   This test claim was processed through [facility]- copay amounts may vary at other pharmacies due to pharmacy/plan contracts, or as the patient moves through the different stages of their insurance plan.     Reyes Sharps, CPHT Pharmacy Technician Patient Advocate Specialist Lead [facility] Patient Advocate Team Direct Number: [phone]  Fax: [phone]

## 2023-11-15 NOTE — Plan of Care (Signed)
   Problem: Clinical Measurements: Goal: Diagnostic test results will improve Outcome: Progressing

## 2023-11-15 NOTE — Progress Notes (Signed)
 Shared w pt that Dr Alvaro has reviewed her xrays and nothing needs to be done currently, to follow-up as an outpatient regarding her KS. Pt very unhappy, demanding to be seen in person w urologist, complains of dysuria and hx of multiple surgeries for previous KS. Instructed to share concerns w hospitalist in the am, on-call NP notified of dysuria, awaiting orders. Noted pt picking at her incision on her neck, instructed to minimize touching her incision and allow to heal.

## 2023-11-15 NOTE — Progress Notes (Signed)
 Assessment & Plan: POD#5 / HD#3 - thrombophlebitis with cellulitis left arm, status post left thyroid  lobectomy - regular diet - IV abx's (Zyvox) per medical team - ASA 325mg  daily for thrombophlebitis - Kpad to left arm for thrombophlebitis - may start Cocoa butter cream to cervical incision - will follow with you; appreciate medical service management - CT scan reviewed - appendicoliths by no sign of acute appendicitis - manage expectantly        [name], MD [facility] practice Office: [phone]        Chief Complaint: Thrombophlebitis, cellulitis  Subjective: Patient in bed, family in room.  Dr. Fausto at bedside.  Objective: Vital signs in last 24 hours: Temp:  [97.7 F (36.5 C)-97.9 F (36.6 C)] 97.9 F (36.6 C) (10/21 0619) Pulse Rate:  [68-94] 72 (10/21 0619) Resp:  [16-18] 16 (10/21 0619) BP: (115-150)/(82-94) 115/85 (10/21 0619) SpO2:  [95 %-99 %] 99 % (10/21 0619) Last BM Date : 11/08/23  Intake/Output from previous day: 10/20 0701 - 10/21 0700 In: 1540 [P.O.:840; IV Piggyback:700] Out: -  Intake/Output this shift: No intake/output data recorded.  Physical Exam: HEENT - sclerae clear, mucous membranes moist Neck - wound with mild erythema, induration, mild soft tissue swelling Ext - palpable cord left forearm  Lab Results:  Recent Labs    11/13/23 1141 11/13/23 1500 11/14/23 0421  WBC 10.3  --  11.0*  HGB 14.0 14.3 14.0  HCT 44.0 42.0 43.5  PLT 294  --  341   BMET Recent Labs    11/13/23 1141 11/13/23 1500 11/14/23 0421  NA 144 139 140  K 3.3* 4.5 4.2  CL 104 106 102  CO2 26  --  26  GLUCOSE 83 75 169*  BUN 10 10 9   CREATININE 0.73 0.70 0.75  CALCIUM 9.0  --  9.1   PT/INR Recent Labs    11/13/23 1141  LABPROT 13.2  INR 0.9   Comprehensive Metabolic Panel:    Component Value Date/Time   NA 140 11/14/2023 0421   NA 139 11/13/2023 1500   NA 140 01/08/2020 1547   NA 147 (H) 11/14/2019  0000   K 4.2 11/14/2023 0421   K 4.5 11/13/2023 1500   CL 102 11/14/2023 0421   CL 106 11/13/2023 1500   CO2 26 11/14/2023 0421   CO2 26 11/13/2023 1141   BUN 9 11/14/2023 0421   BUN 10 11/13/2023 1500   BUN 11 01/08/2020 1547   BUN 10 11/14/2019 0000   CREATININE 0.75 11/14/2023 0421   CREATININE 0.70 11/13/2023 1500   CREATININE 0.74 08/24/2016 1534   GLUCOSE 169 (H) 11/14/2023 0421   GLUCOSE 75 11/13/2023 1500   CALCIUM 9.1 11/14/2023 0421   CALCIUM 9.0 11/13/2023 1141   AST 17 11/13/2023 1141   AST 26 09/07/2023 0620   ALT 15 11/13/2023 1141   ALT 28 09/07/2023 0620   ALKPHOS 90 11/13/2023 1141   ALKPHOS 56 09/07/2023 0620   BILITOT 0.9 11/13/2023 1141   BILITOT 1.5 (H) 09/07/2023 0620   BILITOT 0.7 03/08/2018 1654   PROT 6.9 11/13/2023 1141   PROT 6.8 09/09/2023 0000   PROT 7.6 03/08/2018 1654   ALBUMIN 4.1 11/13/2023 1141   ALBUMIN 3.4 (L) 09/07/2023 0620   ALBUMIN 4.5 03/08/2018 1654    Studies/Results: CT RENAL STONE STUDY Result Date: 11/14/2023 CLINICAL DATA:  Abdominal/flank pain. EXAM: CT ABDOMEN AND PELVIS WITHOUT CONTRAST TECHNIQUE: Multidetector CT imaging of the abdomen and  pelvis was performed following the standard protocol without IV contrast. RADIATION DOSE REDUCTION: This exam was performed according to the departmental dose-optimization program which includes automated exposure control, adjustment of the mA and/or kV according to patient size and/or use of iterative reconstruction technique. COMPARISON:  03/21/2023 FINDINGS: Lower chest: Normal-sized heart.  Minimal bibasilar atelectasis. Hepatobiliary: No focal liver abnormality is seen. No gallstones, gallbladder wall thickening, or biliary dilatation. Pancreas: Unremarkable. No pancreatic ductal dilatation or surrounding inflammatory changes. Spleen: Normal in size without focal abnormality. Adrenals/Urinary Tract: Normal-appearing adrenal glands. 9 mm left renal pelvis calculus. 2 mm lower pole left  renal calculus and 1 mm upper pole left renal calculus. 1 mm lower pole right renal calculus. Normal-appearing ureters and urinary bladder. No hydronephrosis. Stomach/Bowel: 7 mm linear appendicoliths in the mid to distal appendix without evidence of appendicitis. Unremarkable stomach, small bowel and colon. Vascular/Lymphatic: No significant vascular findings are present. No enlarged abdominal or pelvic lymph nodes. Reproductive: Status post hysterectomy. No adnexal masses. Other: No abdominal wall hernia or abnormality. No abdominopelvic ascites. Musculoskeletal: Minimal lumbar and mild lower thoracic spine degenerative changes. IMPRESSION: 1. 9 mm left renal pelvis calculus without hydronephrosis. 2. Additional small bilateral renal calculi. 3. 7 mm linear appendicolith without evidence of appendicitis. Electronically Signed   By: Elspeth Bathe M.D.   On: 11/14/2023 14:46   CT Soft Tissue Neck W Contrast Result Date: 11/13/2023 EXAM: CT NECK WITH CONTRAST 11/13/2023 01:29:59 PM TECHNIQUE: CT of the neck was performed with the administration of 100 mL of iohexol  (OMNIPAQUE ) 300 MG/ML solution. Multiplanar reformatted images are provided for review. Automated exposure control, iterative reconstruction, and/or weight based adjustment of the mA/kV was utilized to reduce the radiation dose to as low as reasonably achievable. COMPARISON: Prominent CTA head and neck 18774 and thyroid  ultrasound 81425. CLINICAL HISTORY: Postoperative neck pain, redness, swelling, recent left thyroid  lobectomy. FINDINGS: AERODIGESTIVE TRACT: The nasopharynx and oropharynx are symmetric. Normal appearance of the tonsils. The oral cavity and floor of mouth are unremarkable. Normal appearance of the supraglottic airway. The vocal folds are symmetric. No significant narrowing of the extrathoracic trachea adjacent to the thyroidectomy site. No discrete mass. No edema. SALIVARY GLANDS: The parotid and submandibular glands are unremarkable.  THYROID : Interval postsurgical changes of left thyroidectomy. There is postoperative fluid in the thyroidectomy bed with adjacent surgical clips. Residual right thyroid  lobe is unremarkable in appearance. LYMPH NODES: No pathologically enlarged lymph nodes in the neck. SOFT TISSUES: Overlying stranding within the subcutaneous tissues. There is skin thickening along the anterior lower neck. There are areas of ill-defined fluid within the deeper subcutaneous tissues of the anterior neck extending towards the thyroidectomy bed. Few locules of gas which are favored to be postoperative in etiology. Findings concerning for postoperative changes with possible development of phlegmon/cellulitis. There is no peripherally enhancing fluid collection to suggest abscess formation. There are a few locules of gas noted extending superiorly and laterally within the subcutaneous tissues of the neck adjacent to the sternocleidomastoid muscles likely reflecting postoperative gas. There is no significantly associated stranding within this region. BRAIN, ORBITS, SINUSES AND MASTOIDS: No acute abnormality. LUNGS AND MEDIASTINUM: No acute abnormality. BONES: No focal bone abnormality. IMPRESSION: 1. Postoperative changes of left thyroidectomy. Adjacent subcutaneous stranding, ill-defined fluid, and skin thickening which likely reflects a combination of postoperative changes with superimposed developing cellulitis/phlegmon. 2. No abscess on the current study. 3. Additional postoperative gas locules in the subcutaneous tissues adjacent to the sternocleidomastoid muscles without significant associated stranding. 4. No  cervical lymphadenopathy. Electronically signed by: Donnice Mania MD 11/13/2023 02:17 PM EDT RP Workstation: [id number]      [name] 11/15/2023  Patient ID: Kayla Sexton, female   DOB: [birth year], 43 y.o.   MRN: [id number]

## 2023-11-15 NOTE — Plan of Care (Signed)
   Problem: Education: Goal: Knowledge of General Education information will improve Description Including pain rating scale, medication(s)/side effects and non-pharmacologic comfort measures Outcome: Progressing   Problem: Health Behavior/Discharge Planning: Goal: Ability to manage health-related needs will improve Outcome: Progressing

## 2023-11-15 NOTE — Plan of Care (Signed)
  Problem: Education: Goal: Knowledge of General Education information will improve Description: Including pain rating scale, medication(s)/side effects and non-pharmacologic comfort measures 11/15/2023 0119 by Debora Ou, RN Outcome: Progressing 11/15/2023 0117 by Debora Ou, RN Outcome: Progressing

## 2023-11-15 NOTE — TOC Progression Note (Signed)
 Transition of Care [facility]) - Progression Note    Patient Details  Name: Kayla Sexton MRN: [id number] Date of Birth: 1980/11/26  Transition of Care [facility]) CM/SW Contact  Alfonse JONELLE Rex, RN Phone Number: 11/15/2023, 10:18 AM  Clinical Narrative:  [facility] Consult received for Medication Assistance, patient has Managed Medicare insurance on file, not eligible for [facility], team notified.                        Expected Discharge Plan and Services                                               Social Drivers of Health (SDOH) Interventions SDOH Screenings   Food Insecurity: No Food Insecurity (11/13/2023)  Housing: Unknown (11/13/2023)  Transportation Needs: No Transportation Needs (11/13/2023)  Utilities: Not At Risk (11/13/2023)  Depression (PHQ2-9): Low Risk  (09/09/2023)  Social Connections: Socially Integrated (11/10/2023)  Tobacco Use: Low Risk  (11/10/2023)    Readmission Risk Interventions     No data to display

## 2023-11-15 NOTE — Progress Notes (Addendum)
 PROGRESS NOTE    Kayla Sexton  FMW:[id number] DOB: [birth year] DOA: 11/13/2023 PCP: Rilla Baller, MD   Brief Narrative:   43 y.o. female with medical history significant of multiple thyroid  nodules, bothering her, status post left thyroid  lobectomy and isthmusectomy 10/16 by Dr. Eletha, CIN 3, history of kidney stones, alpha gal hypersensitivity, prediabetes, comes to the hospital on postoperative day 3 for increased left arm swelling where she had an IV while she was hospitalized, also increased pain, swelling and redness at the thyroidectomy surgical site. Surgery consulted and she is on IV antibiotics.   Assessment & Plan:  Principal Problem:   Cellulitis   Neck Cellulitis, early phlegmon, postoperative infection: -s/p left thyroid  lobectomy/isthmusectomy 10/16  -Surgery on board - On IV Linezolid. -Erythema continues to improve. No discharge noted this morning. Images in media   Left forearm superficial thrombophlebitis: -Continue current management -K-pad heat -supportive care & monitoring  Recurrent UTI in the setting of b/l nephrolithaisis,POA: -CT renal study showed a right sided 9mm non-obstructing stone -Case discussed with on-call urology today. They reviewed her CT scan and history. Advised no intervention this admission at this time. Recommended to keep currently scheduled outpatient f/u with urology on Thursday.  Hypocalcemia: Ordered repeat calcium levels to be checked.    OSA-not on CPAP   Hyperlipidemia-outpatient f/u   Obesity, class II-BMI 35.1. Advised regarding dietary modifications and exercise to facilitate weight loss.  Disposition: Home   DVT prophylaxis: SCDs Start: 11/13/23 1603     Code Status: Full Code Family Communication:   Status is: Observation The patient remains OBS appropriate and will d/c before 2 midnights.    Subjective:  Pt continues to have a lot of pain in her arm.  Also ongoing dysuria.  UA showed no UTI.  CT  renal stone study did show a non-obstrucing stone. Pt asks for urology to be consulted here. Does have upcoming appt with her urologist later this week.  No hematuria.  Has right-sided abdominal pain.  Dr. Eletha in to see pt during my encounter.    Examination:  General exam: awake, alert, no acute distress HEENT: image below of anterior neck Respiratory system: CTAB, no wheezes, rales or rhonchi, normal respiratory effort. Cardiovascular system: normal S1/S2,  RRR, no JVD, murmurs, rubs, gallops, no pedal edema.   Gastrointestinal system: soft, NT, ND, no HSM felt, +bowel sounds. Central nervous system: A&O x3. no gross focal neurologic deficits, normal speech Extremities: left forearm no erythema but with tenderness along area of subcutaneous induration Skin: neck as below Psychiatry: normal mood, congruent affect, judgement and insight appear normal     Diet Orders (From admission, onward)     Start     Ordered   11/13/23 1603  Diet regular Room service appropriate? Yes; Fluid consistency: Thin  Diet effective now       Question Answer Comment  Room service appropriate? Yes   Fluid consistency: Thin      11/13/23 1602            Objective: Vitals:   11/14/23 1415 11/14/23 2133 11/15/23 0619 11/15/23 1430  BP: (!) 140/82 124/86 115/85 107/74  Pulse: 68 90 72 73  Resp: 16 16 16 16   Temp: 97.7 F (36.5 C) 97.7 F (36.5 C) 97.9 F (36.6 C) (!) 97.4 F (36.3 C)  TempSrc: Oral Oral Oral   SpO2: 95% 95% 99% 98%  Weight:      Height:        Intake/Output Summary (  Last 24 hours) at 11/15/2023 1615 Last data filed at 11/15/2023 1400 Gross per 24 hour  Intake 1988.27 ml  Output --  Net 1988.27 ml   Filed Weights   11/13/23 1058  Weight: 84.4 kg    Scheduled Meds:  aspirin  325 mg Oral Daily   famotidine   20 mg Oral BID   polyethylene glycol  17 g Oral Daily   senna-docusate  1 tablet Oral BID   Continuous Infusions:  sodium chloride  40 mL/hr at 11/15/23  1223   linezolid (ZYVOX) IV 600 mg (11/15/23 0901)    Nutritional status     Body mass index is 35.14 kg/m.  Data Reviewed:   CBC: Recent Labs  Lab 11/10/23 1009 11/13/23 1141 11/13/23 1500 11/14/23 0421  WBC 8.2 10.3  --  11.0*  NEUTROABS  --  4.8  --  8.8*  HGB 14.6 14.0 14.3 14.0  HCT 46.5* 44.0 42.0 43.5  MCV 88.7 87.8  --  89.7  PLT 332 294  --  341   Basic Metabolic Panel: Recent Labs  Lab 11/13/23 1141 11/13/23 1500 11/14/23 0421  NA 144 139 140  K 3.3* 4.5 4.2  CL 104 106 102  CO2 26  --  26  GLUCOSE 83 75 169*  BUN 10 10 9   CREATININE 0.73 0.70 0.75  CALCIUM 9.0  --  9.1   GFR: Estimated Creatinine Clearance: 89.3 mL/min (by C-G formula based on SCr of 0.75 mg/dL). Liver Function Tests: Recent Labs  Lab 11/13/23 1141  AST 17  ALT 15  ALKPHOS 90  BILITOT 0.9  PROT 6.9  ALBUMIN 4.1   No results for input(s): LIPASE, AMYLASE in the last 168 hours. No results for input(s): AMMONIA in the last 168 hours. Coagulation Profile: Recent Labs  Lab 11/13/23 1141  INR 0.9   Cardiac Enzymes: No results for input(s): CKTOTAL, CKMB, CKMBINDEX, TROPONINI in the last 168 hours. BNP (last 3 results) No results for input(s): PROBNP in the last 8760 hours. HbA1C: No results for input(s): HGBA1C in the last 72 hours. CBG: No results for input(s): GLUCAP in the last 168 hours. Lipid Profile: No results for input(s): CHOL, HDL, LDLCALC, TRIG, CHOLHDL, LDLDIRECT in the last 72 hours. Thyroid  Function Tests: Recent Labs    11/13/23 1141 11/13/23 1142  TSH 0.884  --   FREET4  --  1.06   Anemia Panel: No results for input(s): VITAMINB12, FOLATE, FERRITIN, TIBC, IRON, RETICCTPCT in the last 72 hours. Sepsis Labs: Recent Labs  Lab 11/13/23 1108 11/13/23 1326  LATICACIDVEN 1.5 1.4    Recent Results (from the past 240 hours)  Urine Culture     Status: None   Collection Time: 11/13/23 11:42 AM   Specimen:  Urine, Random  Result Value Ref Range Status   Specimen Description   Final    URINE, RANDOM Performed at [facility], [address], [place], [place] [address]    Special Requests   Final    NONE Reflexed from [id number] Performed at [facility], [address] [place], [place] [address]    Culture   Final    NO GROWTH Performed at [facility], [address], [place], [place] [address]    Report Status 11/15/2023 FINAL  Final  Culture, blood (Routine x 2)     Status: None (Preliminary result)   Collection Time: 11/13/23 11:43 AM   Specimen: BLOOD LEFT HAND  Result Value Ref Range Status  Specimen Description   Final    BLOOD LEFT HAND Performed at [facility], [address] [place], [place] [address]    Special Requests   Final    BOTTLES DRAWN AEROBIC AND ANAEROBIC Blood Culture results may not be optimal due to an inadequate volume of blood received in culture bottles Performed at [facility], [address], [place], [place] [address]    Culture   Final    NO GROWTH 2 DAYS Performed at [facility], [address], [place], [place] [address]    Report Status PENDING  Incomplete  Culture, blood (Routine x 2)     Status: None (Preliminary result)   Collection Time: 11/13/23  5:08 PM   Specimen: BLOOD LEFT ARM  Result Value Ref Range Status   Specimen Description   Final    BLOOD LEFT ARM Performed at [facility], [address] [place], [place] [address]    Special Requests   Final    BOTTLES DRAWN AEROBIC AND ANAEROBIC Blood Culture results may not be optimal due to an inadequate volume of blood received in culture bottles Performed at [facility], [address], [place], [place] [address]    Culture   Final    NO GROWTH 2 DAYS Performed at [facility], [address], [place], [place] [address]    Report Status PENDING  Incomplete          Radiology Studies: CT RENAL STONE STUDY Result Date: 11/14/2023 CLINICAL DATA:  Abdominal/flank pain. EXAM: CT ABDOMEN AND PELVIS WITHOUT CONTRAST TECHNIQUE: Multidetector CT imaging of the abdomen and pelvis was performed following the standard protocol without IV contrast. RADIATION DOSE REDUCTION: This exam was performed according to the departmental dose-optimization program which includes automated exposure control, adjustment of the mA and/or kV according to patient size and/or use of iterative reconstruction technique. COMPARISON:  03/21/2023 FINDINGS: Lower chest: Normal-sized heart.  Minimal bibasilar atelectasis. Hepatobiliary: No focal liver abnormality is seen. No gallstones, gallbladder wall thickening, or biliary dilatation. Pancreas: Unremarkable. No pancreatic ductal dilatation or surrounding inflammatory changes. Spleen: Normal in size without focal abnormality. Adrenals/Urinary Tract: Normal-appearing adrenal glands. 9 mm left renal pelvis calculus. 2 mm lower pole left renal calculus and 1 mm upper pole left renal calculus. 1 mm lower pole right renal calculus. Normal-appearing ureters and urinary bladder. No hydronephrosis. Stomach/Bowel: 7 mm linear appendicoliths in the mid to distal appendix without evidence of appendicitis. Unremarkable stomach, small bowel and colon. Vascular/Lymphatic: No significant vascular findings are present. No enlarged abdominal or pelvic lymph nodes. Reproductive: Status post hysterectomy. No adnexal masses. Other: No abdominal wall hernia or abnormality. No abdominopelvic ascites. Musculoskeletal: Minimal lumbar and mild lower thoracic spine degenerative changes. IMPRESSION: 1. 9 mm left renal pelvis calculus without hydronephrosis. 2. Additional small bilateral renal calculi. 3. 7 mm linear appendicolith without evidence of appendicitis. Electronically Signed   By: Elspeth Bathe M.D.   On: 11/14/2023 14:46           LOS: 0 days   Time  spent= 45 mins    Burnard DELENA Cunning, DO [facility]  If 7PM-7AM, please contact night-coverage  11/15/2023, 4:15 PM

## 2023-11-16 ENCOUNTER — Telehealth: Payer: Self-pay

## 2023-11-16 ENCOUNTER — Telehealth: Payer: Self-pay | Admitting: Family Medicine

## 2023-11-16 ENCOUNTER — Encounter: Payer: Self-pay | Admitting: Family Medicine

## 2023-11-16 DIAGNOSIS — T8143XA Infection following a procedure, organ and space surgical site, initial encounter: Secondary | ICD-10-CM

## 2023-11-16 DIAGNOSIS — L03221 Cellulitis of neck: Secondary | ICD-10-CM | POA: Diagnosis not present

## 2023-11-16 DIAGNOSIS — T8140XA Infection following a procedure, unspecified, initial encounter: Secondary | ICD-10-CM

## 2023-11-16 LAB — CBC
HCT: 41.2 % (ref 36.0–46.0)
Hemoglobin: 13 g/dL (ref 12.0–15.0)
MCH: 29.1 pg (ref 26.0–34.0)
MCHC: 31.6 g/dL (ref 30.0–36.0)
MCV: 92.2 fL (ref 80.0–100.0)
Platelets: 312 K/uL (ref 150–400)
RBC: 4.47 MIL/uL (ref 3.87–5.11)
RDW: 13.1 % (ref 11.5–15.5)
WBC: 9.9 K/uL (ref 4.0–10.5)
nRBC: 0 % (ref 0.0–0.2)

## 2023-11-16 MED ORDER — PHENAZOPYRIDINE HCL 100 MG PO TABS
200.0000 mg | ORAL_TABLET | Freq: Three times a day (TID) | ORAL | Status: DC
  Administered 2023-11-16 – 2023-11-18 (×6): 200 mg via ORAL
  Filled 2023-11-16 (×6): qty 2

## 2023-11-16 MED ORDER — SODIUM CHLORIDE 0.9 % IV SOLN
2.0000 g | INTRAVENOUS | Status: DC
  Administered 2023-11-16 – 2023-11-18 (×3): 2 g via INTRAVENOUS
  Filled 2023-11-16 (×3): qty 20

## 2023-11-16 MED ORDER — FLUCONAZOLE IN SODIUM CHLORIDE 200-0.9 MG/100ML-% IV SOLN
200.0000 mg | INTRAVENOUS | Status: DC
Start: 2023-11-16 — End: 2023-11-18
  Administered 2023-11-16 – 2023-11-17 (×2): 200 mg via INTRAVENOUS
  Filled 2023-11-16 (×3): qty 100

## 2023-11-16 MED ORDER — LINEZOLID 600 MG PO TABS
600.0000 mg | ORAL_TABLET | Freq: Two times a day (BID) | ORAL | Status: DC
Start: 2023-11-16 — End: 2023-11-18
  Administered 2023-11-16 – 2023-11-18 (×4): 600 mg via ORAL
  Filled 2023-11-16 (×4): qty 1

## 2023-11-16 MED ORDER — FLUCONAZOLE 100MG IVPB: 100.0000 mg | INTRAVENOUS | Status: DC

## 2023-11-16 NOTE — Telephone Encounter (Signed)
 Copied from CRM [id number]. Topic: General - Other >> Nov 16, 2023  9:55 AM Burnard DEL wrote: Reason for CRM: Patient is currently in the hospital after a thyroid  surgery that she had on Thursday. She is requesting a phone call from the person that does FMLA to possibly have her FMLA extended.

## 2023-11-16 NOTE — Telephone Encounter (Signed)
 Pt called on 10/21 to let us  know she is an inpatient in hospital for a post op infection of her Thyroid . Pt was concerned with some stones that were seen on CT scan while inpatient. I let her know that I would notify provider. Provider here is aware and pt is being managed by inpatient Dr's at [facility].

## 2023-11-16 NOTE — Progress Notes (Signed)
 [facility] PROGRESS NOTE    Progress Note  WINNONA Kayla Sexton  FMW:[id number] DOB: 23-Dec-1980 DOA: 11/13/2023 PCP: Rilla Baller, MD     Brief Narrative:   Kayla Sexton is an 43 y.o. female past medical history significant for multiple thyroid  nodules status post left thyroid  lobectomy on 11/10/2023, with cervical intraepithelial neoplasm grade 3 comes into the hospital 3 days post op for increased left arm swelling pain, she also has redness at the surgical site General Surgery was consulted started empirically on antibiotics   Assessment/Plan:   Neck cellulitis early phlegmon: Status post thyroid  surgery on 10/31/2023. CT scan of the the neck showed stranding with ill-defined fluid with skin thickening at the surgical site.  Possibly developing phlegmon no current abscess. Cont on IV linezolid. General surgery was consulted.  Left forearm superficial thrombophlebitis: Elevate arm continue PPI.  Recurrent UTI in the setting of bilateral nephrolithiasis: Case discussed with urology recommended no intervention at this time.  Follow-up with them as an outpatient. Start rocephin  empirically, she was previously on abx as an outpatient, urine cultures will be unreliable. Start diflucan  empirically.  Hypocalcemia: Repeated was unremarkable.  Obstructive sleep apnea: Obtain CPAP.  Hyperlipidemia: Noted.  Morbid obesity class II: Noted.     DVT prophylaxis: lovenox Family Communication:none Status is: Observation     Code Status:     Code Status Orders  (From admission, onward)           Start     Ordered   11/13/23 1505  Full code  Continuous       Question:  By:  Answer:  Consent: discussion documented in EHR   11/13/23 1504           Code Status History     Date Active Date Inactive Code Status Order ID Comments User Context   11/10/2023 1448 11/11/2023 1536 Full Code [id number]  Eletha Boas, MD Inpatient   09/06/2023 1438 09/07/2023 1427  Full Code [id number]  Celinda Alm Lot, MD ED         IV Access:   Peripheral IV   Procedures and diagnostic studies:   CT RENAL STONE STUDY Result Date: 11/14/2023 CLINICAL DATA:  Abdominal/flank pain. EXAM: CT ABDOMEN AND PELVIS WITHOUT CONTRAST TECHNIQUE: Multidetector CT imaging of the abdomen and pelvis was performed following the standard protocol without IV contrast. RADIATION DOSE REDUCTION: This exam was performed according to the departmental dose-optimization program which includes automated exposure control, adjustment of the mA and/or kV according to patient size and/or use of iterative reconstruction technique. COMPARISON:  03/21/2023 FINDINGS: Lower chest: Normal-sized heart.  Minimal bibasilar atelectasis. Hepatobiliary: No focal liver abnormality is seen. No gallstones, gallbladder wall thickening, or biliary dilatation. Pancreas: Unremarkable. No pancreatic ductal dilatation or surrounding inflammatory changes. Spleen: Normal in size without focal abnormality. Adrenals/Urinary Tract: Normal-appearing adrenal glands. 9 mm left renal pelvis calculus. 2 mm lower pole left renal calculus and 1 mm upper pole left renal calculus. 1 mm lower pole right renal calculus. Normal-appearing ureters and urinary bladder. No hydronephrosis. Stomach/Bowel: 7 mm linear appendicoliths in the mid to distal appendix without evidence of appendicitis. Unremarkable stomach, small bowel and colon. Vascular/Lymphatic: No significant vascular findings are present. No enlarged abdominal or pelvic lymph nodes. Reproductive: Status post hysterectomy. No adnexal masses. Other: No abdominal wall hernia or abnormality. No abdominopelvic ascites. Musculoskeletal: Minimal lumbar and mild lower thoracic spine degenerative changes. IMPRESSION: 1. 9 mm left renal pelvis calculus without hydronephrosis. 2. Additional small bilateral renal calculi.  3. 7 mm linear appendicolith without evidence of appendicitis.  Electronically Signed   By: Elspeth Bathe M.D.   On: 11/14/2023 14:46     Medical Consultants:   None.   Subjective:    Kayla Sexton complaing of dysuria  Objective:    Vitals:   11/15/23 0619 11/15/23 1430 11/15/23 2128 11/16/23 0548  BP: 115/85 107/74 114/81 121/70  Pulse: 72 73 72 77  Resp: 16 16 16 18   Temp: 97.9 F (36.6 C) (!) 97.4 F (36.3 C) 97.7 F (36.5 C) 97.7 F (36.5 C)  TempSrc: Oral  Oral Oral  SpO2: 99% 98% 100% 98%  Weight:      Height:       SpO2: 98 %   Intake/Output Summary (Last 24 hours) at 11/16/2023 0827 Last data filed at 11/16/2023 0600 Gross per 24 hour  Intake 2988.4 ml  Output --  Net 2988.4 ml   Filed Weights   11/13/23 1058  Weight: 84.4 kg    Exam: General exam: In no acute distress. Respiratory system: Good air movement and clear to auscultation. Cardiovascular system: S1 & S2 heard, RRR. No JVD, murmurs, rubs, gallops or clicks.  Gastrointestinal system: Abdomen is nondistended, soft and nontender.  Central nervous system: Alert and oriented. No focal neurological deficits. Extremities: No pedal edema. Skin: No rashes, lesions or ulcers Psychiatry: Judgement and insight appear normal. Mood & affect appropriate.    Data Reviewed:    Labs: Basic Metabolic Panel: Recent Labs  Lab 11/13/23 1141 11/13/23 1500 11/14/23 0421  NA 144 139 140  K 3.3* 4.5 4.2  CL 104 106 102  CO2 26  --  26  GLUCOSE 83 75 169*  BUN 10 10 9   CREATININE 0.73 0.70 0.75  CALCIUM 9.0  --  9.1   GFR Estimated Creatinine Clearance: 89.3 mL/min (by C-G formula based on SCr of 0.75 mg/dL). Liver Function Tests: Recent Labs  Lab 11/13/23 1141  AST 17  ALT 15  ALKPHOS 90  BILITOT 0.9  PROT 6.9  ALBUMIN 4.1   No results for input(s): LIPASE, AMYLASE in the last 168 hours. No results for input(s): AMMONIA in the last 168 hours. Coagulation profile Recent Labs  Lab 11/13/23 1141  INR 0.9   COVID-19 Labs  No results for  input(s): DDIMER, FERRITIN, LDH, CRP in the last 72 hours.  Lab Results  Component Value Date   SARSCOV2NAA Not Detected 07/09/2020   SARSCOV2NAA NEGATIVE 01/01/2020    CBC: Recent Labs  Lab 11/10/23 1009 11/13/23 1141 11/13/23 1500 11/14/23 0421 11/16/23 0504  WBC 8.2 10.3  --  11.0* 9.9  NEUTROABS  --  4.8  --  8.8*  --   HGB 14.6 14.0 14.3 14.0 13.0  HCT 46.5* 44.0 42.0 43.5 41.2  MCV 88.7 87.8  --  89.7 92.2  PLT 332 294  --  341 312   Cardiac Enzymes: No results for input(s): CKTOTAL, CKMB, CKMBINDEX, TROPONINI in the last 168 hours. BNP (last 3 results) No results for input(s): PROBNP in the last 8760 hours. CBG: No results for input(s): GLUCAP in the last 168 hours. D-Dimer: No results for input(s): DDIMER in the last 72 hours. Hgb A1c: No results for input(s): HGBA1C in the last 72 hours. Lipid Profile: No results for input(s): CHOL, HDL, LDLCALC, TRIG, CHOLHDL, LDLDIRECT in the last 72 hours. Thyroid  function studies: Recent Labs    11/13/23 1141  TSH 0.884   Anemia work up: No results for input(s):  VITAMINB12, FOLATE, FERRITIN, TIBC, IRON, RETICCTPCT in the last 72 hours. Sepsis Labs: Recent Labs  Lab 11/10/23 1009 11/13/23 1108 11/13/23 1141 11/13/23 1326 11/14/23 0421 11/16/23 0504  WBC 8.2  --  10.3  --  11.0* 9.9  LATICACIDVEN  --  1.5  --  1.4  --   --    Microbiology Recent Results (from the past 240 hours)  Urine Culture     Status: None   Collection Time: 11/13/23 11:42 AM   Specimen: Urine, Random  Result Value Ref Range Status   Specimen Description   Final    URINE, RANDOM Performed at [facility], [address] [place], [place] [address]    Special Requests   Final    NONE Reflexed from [id number] Performed at [facility], [address], [place], [place] [address]    Culture   Final    NO GROWTH Performed at [facility], [address], [place], [place] [address]    Report Status 11/15/2023 FINAL  Final  Culture, blood (Routine x 2)     Status: None (Preliminary result)   Collection Time: 11/13/23 11:43 AM   Specimen: BLOOD LEFT HAND  Result Value Ref Range Status   Specimen Description   Final    BLOOD LEFT HAND Performed at [facility], [address], [place], [place] [address]    Special Requests   Final    BOTTLES DRAWN AEROBIC AND ANAEROBIC Blood Culture results may not be optimal due to an inadequate volume of blood received in culture bottles Performed at [facility], [address] [place], [place] [address]    Culture   Final    NO GROWTH 3 DAYS Performed at [facility], [address] [place], [place] [address]    Report Status PENDING  Incomplete  Culture, blood (Routine x 2)     Status: None (Preliminary result)   Collection Time: 11/13/23  5:08 PM   Specimen: BLOOD LEFT ARM  Result Value Ref Range Status   Specimen Description   Final    BLOOD LEFT ARM Performed at [facility], [address], [place], [place] [address]    Special Requests   Final    BOTTLES DRAWN AEROBIC AND ANAEROBIC Blood Culture results may not be optimal due to an inadequate volume of blood received in culture bottles Performed at [facility], [address], [place], [place] [address]    Culture   Final    NO GROWTH 3 DAYS Performed at [facility], [address], [place], [place] [address]    Report Status PENDING  Incomplete     Medications:    aspirin  325 mg Oral Daily   famotidine   20 mg Oral BID   polyethylene glycol  17 g Oral Daily   senna-docusate  1 tablet Oral BID   Continuous Infusions:  sodium chloride  40 mL/hr at 11/15/23 1223   linezolid (ZYVOX) IV 600 mg (11/15/23 2144)      LOS: 0 days   Erle Odell Castor  [facility]  11/16/2023, 8:27 AM

## 2023-11-16 NOTE — Plan of Care (Signed)
  Problem: Education: Goal: Knowledge of General Education information will improve Description: Including pain rating scale, medication(s)/side effects and non-pharmacologic comfort measures Outcome: Progressing   Problem: Clinical Measurements: Goal: Diagnostic test results will improve Outcome: Progressing   Problem: Coping: Goal: Level of anxiety will decrease Outcome: Progressing   Problem: Elimination: Goal: Will not experience complications related to bowel motility Outcome: Progressing Goal: Will not experience complications related to urinary retention Outcome: Progressing   Problem: Pain Managment: Goal: General experience of comfort will improve and/or be controlled Outcome: Progressing   Problem: Safety: Goal: Ability to remain free from injury will improve Outcome: Progressing

## 2023-11-16 NOTE — Telephone Encounter (Signed)
 Patient called again regarding her kidney stones and inpatient care at [facility]. Patient is wanting to know to if Provider can reach out to [facility] and communicate alternatives to help her try and pass her stones while she is admitted. Patient is aware that her 9 mm stone will not be able to pass at this moment. However, she is wanting something that will at least help her pass the other smaller stones. Patient states that she has been communicating this with Darryle Law, but feels she is not being heard. Please advise.

## 2023-11-16 NOTE — Progress Notes (Signed)
 Assessment & Plan: POD#6 / HD#4 - thrombophlebitis with cellulitis left arm, status post left thyroid  lobectomy - regular diet - IV abx's (Zyvox) per medical team - ASA 325mg  daily for thrombophlebitis - Kpad to left arm for thrombophlebitis - Cocoa butter cream to cervical incision  OK for discharge on oral abx's from surgical perspective.  Will plan office follow up.  Neck erythema may be more of a suture material reaction given patient's history of similar such episodes.  Nevertheless, would complete treatment for cellulitis.  Thrombophlebitis in left forearm will be slow to resolve - continue ASA and symptomatic treatment.        [name], MD [facility] practice Office: [phone]        Chief Complaint: Cellulitis, thrombophlebitis  Subjective: Patient in bed, worried about kidney stones.  Objective: Vital signs in last 24 hours: Temp:  [97.4 F (36.3 C)-97.7 F (36.5 C)] 97.7 F (36.5 C) (10/22 0548) Pulse Rate:  [72-77] 77 (10/22 0548) Resp:  [16-18] 18 (10/22 0548) BP: (107-121)/(70-81) 121/70 (10/22 0548) SpO2:  [98 %-100 %] 98 % (10/22 0548) Last BM Date : 11/15/23  Intake/Output from previous day: 10/21 0701 - 10/22 0700 In: 2988.4 [P.O.:2160; I.V.:219.2; IV Piggyback:609.2] Out: -  Intake/Output this shift: No intake/output data recorded.  Physical Exam: HEENT - sclerae clear, mucous membranes moist Neck - wound with intense erythema and induration, partial separation of incision, no drainage Ext - palpable cord left forearm, less tender  Lab Results:  Recent Labs    11/14/23 0421 11/16/23 0504  WBC 11.0* 9.9  HGB 14.0 13.0  HCT 43.5 41.2  PLT 341 312   BMET Recent Labs    11/13/23 1141 11/13/23 1500 11/14/23 0421  NA 144 139 140  K 3.3* 4.5 4.2  CL 104 106 102  CO2 26  --  26  GLUCOSE 83 75 169*  BUN 10 10 9   CREATININE 0.73 0.70 0.75  CALCIUM 9.0  --  9.1   PT/INR Recent Labs     11/13/23 1141  LABPROT 13.2  INR 0.9   Comprehensive Metabolic Panel:    Component Value Date/Time   NA 140 11/14/2023 0421   NA 139 11/13/2023 1500   NA 140 01/08/2020 1547   NA 147 (H) 11/14/2019 0000   K 4.2 11/14/2023 0421   K 4.5 11/13/2023 1500   CL 102 11/14/2023 0421   CL 106 11/13/2023 1500   CO2 26 11/14/2023 0421   CO2 26 11/13/2023 1141   BUN 9 11/14/2023 0421   BUN 10 11/13/2023 1500   BUN 11 01/08/2020 1547   BUN 10 11/14/2019 0000   CREATININE 0.75 11/14/2023 0421   CREATININE 0.70 11/13/2023 1500   CREATININE 0.74 08/24/2016 1534   GLUCOSE 169 (H) 11/14/2023 0421   GLUCOSE 75 11/13/2023 1500   CALCIUM 9.1 11/14/2023 0421   CALCIUM 9.0 11/13/2023 1141   AST 17 11/13/2023 1141   AST 26 09/07/2023 0620   ALT 15 11/13/2023 1141   ALT 28 09/07/2023 0620   ALKPHOS 90 11/13/2023 1141   ALKPHOS 56 09/07/2023 0620   BILITOT 0.9 11/13/2023 1141   BILITOT 1.5 (H) 09/07/2023 0620   BILITOT 0.7 03/08/2018 1654   PROT 6.9 11/13/2023 1141   PROT 6.8 09/09/2023 0000   PROT 7.6 03/08/2018 1654   ALBUMIN 4.1 11/13/2023 1141   ALBUMIN 3.4 (L) 09/07/2023 0620   ALBUMIN 4.5 03/08/2018 1654    Studies/Results: CT RENAL STONE STUDY  Result Date: 11/14/2023 CLINICAL DATA:  Abdominal/flank pain. EXAM: CT ABDOMEN AND PELVIS WITHOUT CONTRAST TECHNIQUE: Multidetector CT imaging of the abdomen and pelvis was performed following the standard protocol without IV contrast. RADIATION DOSE REDUCTION: This exam was performed according to the departmental dose-optimization program which includes automated exposure control, adjustment of the mA and/or kV according to patient size and/or use of iterative reconstruction technique. COMPARISON:  03/21/2023 FINDINGS: Lower chest: Normal-sized heart.  Minimal bibasilar atelectasis. Hepatobiliary: No focal liver abnormality is seen. No gallstones, gallbladder wall thickening, or biliary dilatation. Pancreas: Unremarkable. No pancreatic ductal  dilatation or surrounding inflammatory changes. Spleen: Normal in size without focal abnormality. Adrenals/Urinary Tract: Normal-appearing adrenal glands. 9 mm left renal pelvis calculus. 2 mm lower pole left renal calculus and 1 mm upper pole left renal calculus. 1 mm lower pole right renal calculus. Normal-appearing ureters and urinary bladder. No hydronephrosis. Stomach/Bowel: 7 mm linear appendicoliths in the mid to distal appendix without evidence of appendicitis. Unremarkable stomach, small bowel and colon. Vascular/Lymphatic: No significant vascular findings are present. No enlarged abdominal or pelvic lymph nodes. Reproductive: Status post hysterectomy. No adnexal masses. Other: No abdominal wall hernia or abnormality. No abdominopelvic ascites. Musculoskeletal: Minimal lumbar and mild lower thoracic spine degenerative changes. IMPRESSION: 1. 9 mm left renal pelvis calculus without hydronephrosis. 2. Additional small bilateral renal calculi. 3. 7 mm linear appendicolith without evidence of appendicitis. Electronically Signed   By: Elspeth Bathe M.D.   On: 11/14/2023 14:46      [name] 11/16/2023  Patient ID: Kayla Sexton, female   DOB: Apr 30, 1980, 43 y.o.   MRN: [id number]

## 2023-11-17 ENCOUNTER — Ambulatory Visit: Admitting: Urology

## 2023-11-17 ENCOUNTER — Encounter: Payer: Self-pay | Admitting: Family Medicine

## 2023-11-17 DIAGNOSIS — I808 Phlebitis and thrombophlebitis of other sites: Secondary | ICD-10-CM | POA: Diagnosis not present

## 2023-11-17 DIAGNOSIS — R221 Localized swelling, mass and lump, neck: Secondary | ICD-10-CM | POA: Diagnosis not present

## 2023-11-17 DIAGNOSIS — L539 Erythematous condition, unspecified: Secondary | ICD-10-CM

## 2023-11-17 DIAGNOSIS — L03221 Cellulitis of neck: Secondary | ICD-10-CM | POA: Diagnosis not present

## 2023-11-17 DIAGNOSIS — T8143XA Infection following a procedure, organ and space surgical site, initial encounter: Secondary | ICD-10-CM | POA: Diagnosis not present

## 2023-11-17 MED ORDER — HYDROCORTISONE 1 % EX CREA
TOPICAL_CREAM | Freq: Two times a day (BID) | CUTANEOUS | Status: DC | PRN
  Filled 2023-11-17: qty 28

## 2023-11-17 MED ORDER — DIPHENHYDRAMINE HCL 25 MG PO CAPS
25.0000 mg | ORAL_CAPSULE | Freq: Four times a day (QID) | ORAL | Status: DC | PRN
  Administered 2023-11-17: 25 mg via ORAL
  Filled 2023-11-17: qty 1

## 2023-11-17 NOTE — Plan of Care (Signed)
 ?  Problem: Clinical Measurements: ?Goal: Will remain free from infection ?Outcome: Progressing ?  ?Problem: Clinical Measurements: ?Goal: Diagnostic test results will improve ?Outcome: Progressing ?  ?

## 2023-11-17 NOTE — Progress Notes (Signed)
 [facility] PROGRESS NOTE    Progress Note  Kayla Sexton  FMW:[id number] DOB: 1980-06-16 DOA: 11/13/2023 PCP: Rilla Baller, MD     Brief Narrative:   Kayla Sexton is an 43 y.o. female past medical history significant for multiple thyroid  nodules status post left thyroid  lobectomy on 11/10/2023, with cervical intraepithelial neoplasm grade 3 comes into the hospital 3 days post op for increased left arm swelling pain, she also has redness at the surgical site General Surgery was consulted started empirically on antibiotics   Assessment/Plan:   Neck cellulitis early phlegmon: Status post thyroid  surgery on 10/31/2023. CT scan of the the neck showed stranding with ill-defined fluid with skin thickening at the surgical site.   Ok to switch to oral Linezolid. Surgery recommended conservative management.  Further management per surgery. Can switch her to p.o. antibiotics for 14 days.  Left forearm superficial thrombophlebitis: Elevate arm continue PPI.  Recurrent UTI in the setting of bilateral nephrolithiasis: Case discussed with urology recommended no intervention at this time.  Follow-up with them as an outpatient. Continue IV Rocephin  and Diflucan . She will need to follow-up with urology as an outpatient.  Hypocalcemia: Repeated was unremarkable.  Obstructive sleep apnea: Obtain CPAP.  Hyperlipidemia: Noted.  Morbid obesity class II: Noted.     DVT prophylaxis: lovenox Family Communication:none Status is: Observation     Code Status:     Code Status Orders  (From admission, onward)           Start     Ordered   11/13/23 1505  Full code  Continuous       Question:  By:  Answer:  Consent: discussion documented in EHR   11/13/23 1504           Code Status History     Date Active Date Inactive Code Status Order ID Comments User Context   11/10/2023 1448 11/11/2023 1536 Full Code [id number]  Eletha Boas, MD Inpatient   09/06/2023 1438  09/07/2023 1427 Full Code [id number]  Celinda Alm Lot, MD ED         IV Access:   Peripheral IV   Procedures and diagnostic studies:   No results found.    Medical Consultants:   None.   Subjective:    Kayla Sexton relates her dysuria is improved.  Objective:    Vitals:   11/16/23 0548 11/16/23 1406 11/16/23 2029 11/17/23 0502  BP: 121/70 117/76 123/86 117/77  Pulse: 77 78 85 86  Resp: 18 16 16 16   Temp: 97.7 F (36.5 C) 97.8 F (36.6 C) 98.5 F (36.9 C) 98.2 F (36.8 C)  TempSrc: Oral Oral Oral Oral  SpO2: 98% 99% 96% 99%  Weight:      Height:       SpO2: 99 %   Intake/Output Summary (Last 24 hours) at 11/17/2023 0931 Last data filed at 11/17/2023 0600 Gross per 24 hour  Intake 1800 ml  Output 0 ml  Net 1800 ml   Filed Weights   11/13/23 1058  Weight: 84.4 kg    Exam: General exam: In no acute distress. Respiratory system: Good air movement and clear to auscultation. Cardiovascular system: S1 & S2 heard, RRR. No JVD. Gastrointestinal system: Abdomen is nondistended, soft and nontender.  Extremities: No pedal edema. Skin: Skin erythema is receding Psychiatry: Judgement and insight appear normal. Mood & affect appropriate.   Data Reviewed:    Labs: Basic Metabolic Panel: Recent Labs  Lab 11/13/23 1141 11/13/23 1500 11/14/23  0421  NA 144 139 140  K 3.3* 4.5 4.2  CL 104 106 102  CO2 26  --  26  GLUCOSE 83 75 169*  BUN 10 10 9   CREATININE 0.73 0.70 0.75  CALCIUM 9.0  --  9.1   GFR Estimated Creatinine Clearance: 89.3 mL/min (by C-G formula based on SCr of 0.75 mg/dL). Liver Function Tests: Recent Labs  Lab 11/13/23 1141  AST 17  ALT 15  ALKPHOS 90  BILITOT 0.9  PROT 6.9  ALBUMIN 4.1   No results for input(s): LIPASE, AMYLASE in the last 168 hours. No results for input(s): AMMONIA in the last 168 hours. Coagulation profile Recent Labs  Lab 11/13/23 1141  INR 0.9   COVID-19 Labs  No results for  input(s): DDIMER, FERRITIN, LDH, CRP in the last 72 hours.  Lab Results  Component Value Date   SARSCOV2NAA Not Detected 07/09/2020   SARSCOV2NAA NEGATIVE 01/01/2020    CBC: Recent Labs  Lab 11/10/23 1009 11/13/23 1141 11/13/23 1500 11/14/23 0421 11/16/23 0504  WBC 8.2 10.3  --  11.0* 9.9  NEUTROABS  --  4.8  --  8.8*  --   HGB 14.6 14.0 14.3 14.0 13.0  HCT 46.5* 44.0 42.0 43.5 41.2  MCV 88.7 87.8  --  89.7 92.2  PLT 332 294  --  341 312   Cardiac Enzymes: No results for input(s): CKTOTAL, CKMB, CKMBINDEX, TROPONINI in the last 168 hours. BNP (last 3 results) No results for input(s): PROBNP in the last 8760 hours. CBG: No results for input(s): GLUCAP in the last 168 hours. D-Dimer: No results for input(s): DDIMER in the last 72 hours. Hgb A1c: No results for input(s): HGBA1C in the last 72 hours. Lipid Profile: No results for input(s): CHOL, HDL, LDLCALC, TRIG, CHOLHDL, LDLDIRECT in the last 72 hours. Thyroid  function studies: No results for input(s): TSH, T4TOTAL, T3FREE, THYROIDAB in the last 72 hours.  Invalid input(s): FREET3  Anemia work up: No results for input(s): VITAMINB12, FOLATE, FERRITIN, TIBC, IRON, RETICCTPCT in the last 72 hours. Sepsis Labs: Recent Labs  Lab 11/10/23 1009 11/13/23 1108 11/13/23 1141 11/13/23 1326 11/14/23 0421 11/16/23 0504  WBC 8.2  --  10.3  --  11.0* 9.9  LATICACIDVEN  --  1.5  --  1.4  --   --    Microbiology Recent Results (from the past 240 hours)  Urine Culture     Status: None   Collection Time: 11/13/23 11:42 AM   Specimen: Urine, Random  Result Value Ref Range Status   Specimen Description   Final    URINE, RANDOM Performed at [facility], [address], [place], [place] [address]    Special Requests   Final    NONE Reflexed from [id number] Performed at [facility], [address] [place], [place] [address]     Culture   Final    NO GROWTH Performed at [facility], [address] [place], [place] [address]    Report Status 11/15/2023 FINAL  Final  Culture, blood (Routine x 2)     Status: None (Preliminary result)   Collection Time: 11/13/23 11:43 AM   Specimen: BLOOD LEFT HAND  Result Value Ref Range Status   Specimen Description   Final    BLOOD LEFT HAND Performed at [facility], [address] [place], [place] [address]    Special Requests   Final    BOTTLES DRAWN AEROBIC AND ANAEROBIC Blood  Culture results may not be optimal due to an inadequate volume of blood received in culture bottles Performed at [facility], [address], [place], [place] [address]    Culture   Final    NO GROWTH 4 DAYS Performed at [facility], [address] [place], [place] [address]    Report Status PENDING  Incomplete  Culture, blood (Routine x 2)     Status: None (Preliminary result)   Collection Time: 11/13/23  5:08 PM   Specimen: BLOOD LEFT ARM  Result Value Ref Range Status   Specimen Description   Final    BLOOD LEFT ARM Performed at [facility], [address], [place], [place] [address]    Special Requests   Final    BOTTLES DRAWN AEROBIC AND ANAEROBIC Blood Culture results may not be optimal due to an inadequate volume of blood received in culture bottles Performed at [facility], [address], [place], [place] [address]    Culture   Final    NO GROWTH 4 DAYS Performed at [facility], [address] [place], [place] [address]    Report Status PENDING  Incomplete     Medications:    aspirin  325 mg Oral Daily   famotidine   20 mg Oral BID   linezolid  600 mg Oral Q12H   phenazopyridine   200 mg Oral TID WC   polyethylene glycol  17 g Oral Daily   senna-docusate  1 tablet Oral BID   Continuous Infusions:  cefTRIAXone  (ROCEPHIN )  IV 2 g (11/17/23 0801)   fluconazole  (DIFLUCAN ) IV 200 mg (11/16/23 1619)       LOS: 0 days   Erle Odell Castor  [facility]  11/17/2023, 9:31 AM

## 2023-11-17 NOTE — Consult Note (Signed)
 NAME: Kayla Sexton  DOB: 1980/09/22  MRN: [id number]  Date/Time: 11/17/2023 8:49 PM  REQUESTING PROVIDER: Dr. Odell Castor Subjective:  REASON FOR CONSULT: Surgical site infection ? Kayla Sexton is a 43 y.o. female with a history of CIN-3, kidney stones, alpha gal hypersensitivity, prediabetes, status post left thyroid  lobectomy and isthmusectomy on 11/10/2023 by Dr. Eletha for multiple thyroid  nodules came to the ED on 11/13/2023 with swelling of the left arm where she had an IV site when she was in the OR. In the ED vitals were BP of 160/108, temperature 98, pulse 115 and respiratory rate of 19 and sats of 98%. WBC was 10.3, Hb 14, platelet 294 and creatinine of 0.73. In the ED they noted that the surgical incision site was red even though she did not complain of that.  She was admitted for this of the surgical incision site and started on linezolid.  And got a dose of ceftriaxone  the day of admission She was also noted to have superficial thrombophlebitis at the site of the IV line on the left forearm. Patient has mentioned that she always reacted to any surgical wounds with redness.  And she also mentioned that she would get blisters on her skin once a month and her doctor checked her ANA and was mildly elevated and she is being referred to the rheumatologist to rule out lupus. She is concerned that below the surgical incision area there are a few papules appearing  She was seen by Dr. Eletha  yesterday who wrote in his note the neck erythema may be more of a suture material reaction given patient's history of similar such episodes.  But nevertheless he wanted to complete treatment for cellulitis.  I am asked to see the patient by the hospitalist for the cellulitis. Past Medical History:  Diagnosis Date   Anemia    Anxiety    Arthritis    Asthma    Cancer (HCC) 2006   CERVICAL , stage 1 ovarian cancer   CIN III (cervical intraepithelial neoplasia III)    Dysuria    Family history of  adverse reaction to anesthesia    DAD AND BROTHER AND MOM-N/V   GERD (gastroesophageal reflux disease)    OCC   Hematuria    HH (hiatus hernia)    History of cervical dysplasia 2006   CIN1 and CIN2   History of kidney stones    Migraines    PCOS (polycystic ovarian syndrome)    Dr. Mat   PONV (postoperative nausea and vomiting)    HARD TO WAKE UP   Right ureteral calculus    Sleep apnea    DOES NOT USE CPAP   Urinary incontinence without sensory awareness     Past Surgical History:  Procedure Laterality Date   ABDOMINAL HYSTERECTOMY     total / laporscopic   CERVICAL BIOPSY  W/ LOOP ELECTRODE EXCISION  12-04-2004   dr mat  [facility]   COLONOSCOPY  03/2008   Dr. Dianna   COMBINED HYSTEROSCOPY DIAGNOSTIC / D&C  02-28-2012   dr mat   @ [facility]   CYSTOSCOPY W/ RETROGRADES Right 03/31/2018   Procedure: CYSTOSCOPY WITH RETROGRADE PYELOGRAM;  Surgeon: Francisca Redell BROCKS, MD;  Location: [facility];  Service: Urology;  Laterality: Right;   CYSTOSCOPY W/ RETROGRADES  11/11/2020   Procedure: CYSTOSCOPY WITH RETROGRADE PYELOGRAM;  Surgeon: Francisca Redell BROCKS, MD;  Location: [facility];  Service: Urology;;   CYSTOSCOPY WITH RETROGRADE PYELOGRAM, URETEROSCOPY AND STENT PLACEMENT Bilateral 07/21/2017  Procedure: CYSTOSCOPY WITH BILATERAL  RETROGRADE BILATERAL URETEROSCOPY AND STENTS  PLACEMENT;  Surgeon: Watt Rush, MD;  Location[facility];  Service: Urology;  Laterality: Bilateral;   CYSTOSCOPY WITH RETROGRADE URETHROGRAM Left 01/04/2020   Procedure: CYSTOSCOPY WITH RETROGRADE URETHROGRAM;  Surgeon: Francisca Redell BROCKS, MD;  Location: [facility];  Service: Urology;  Laterality: Left;   CYSTOSCOPY/URETEROSCOPY/HOLMIUM LASER/STENT PLACEMENT Left 03/17/2018   Procedure: CYSTOSCOPY/URETEROSCOPY/HOLMIUM LASER/STENT PLACEMENT;  Surgeon: Francisca Redell BROCKS, MD;  Location: [facility];  Service: Urology;  Laterality: Left;   CYSTOSCOPY/URETEROSCOPY/HOLMIUM LASER/STENT PLACEMENT Right 03/31/2018    Procedure: CYSTOSCOPY/URETEROSCOPY/HOLMIUM LASER/STENT PLACEMENT;  Surgeon: Francisca Redell BROCKS, MD;  Location: [facility];  Service: Urology;  Laterality: Right;   CYSTOSCOPY/URETEROSCOPY/HOLMIUM LASER/STENT PLACEMENT Left 01/04/2020   Procedure: CYSTOSCOPY/URETEROSCOPY/HOLMIUM LASER;  Surgeon: Francisca Redell BROCKS, MD;  Location: [facility];  Service: Urology;  Laterality: Left;   CYSTOSCOPY/URETEROSCOPY/HOLMIUM LASER/STENT PLACEMENT Left 11/11/2020   Procedure: CYSTOSCOPY/URETEROSCOPY/HOLMIUM LASER;  Surgeon: Francisca Redell BROCKS, MD;  Location: [facility];  Service: Urology;  Laterality: Left;   CYSTOSCOPY/URETEROSCOPY/HOLMIUM LASER/STENT PLACEMENT Left 07/02/2022   Procedure: CYSTOSCOPY/URETEROSCOPY/HOLMIUM LASER FOR STONE;  Surgeon: Francisca Redell BROCKS, MD;  Location: [facility];  Service: Urology;  Laterality: Left;   CYSTOSCOPY/URETEROSCOPY/HOLMIUM LASER/STENT PLACEMENT Left 03/25/2023   Procedure: CYSTOSCOPY/URETEROSCOPY/HOLMIUM LASER;  Surgeon: Francisca Redell BROCKS, MD;  Location: [facility];  Service: Urology;  Laterality: Left;   DILATION AND CURETTAGE OF UTERUS     endoscopy  2010   Dr. Dyane   EXTRACORPOREAL SHOCK WAVE LITHOTRIPSY  08/2021   FINGER SURGERY Right    MIDDLE FINGER   FOOT SURGERY Left 2015   5th bunionectomy w/ 2nd digit w/ fusion , retained hardware w/ 5th toe   FRACTURE SURGERY     left shoulder   HOLMIUM LASER APPLICATION Bilateral 07/21/2017   Procedure: HOLMIUM LASER APPLICATION;  Surgeon: Watt Rush, MD;  Location: [facility];  Service: Urology;  Laterality: Bilateral;   THYROID  LOBECTOMY Left 11/10/2023   pathology: Benign thyroid  follicular nodular disease / nodular follicular hyperplasia. 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[employer], 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    Social Drivers of Corporate investment banker Strain: Not on file  Food Insecurity: No Food Insecurity (11/13/2023)   Hunger Vital Sign    Worried About Running Out of Food in the Last Year: Never true    Ran Out of Food in the Last Year: Never true  Transportation Needs: No Transportation Needs (11/13/2023)   PRAPARE - Administrator, Civil Service (Medical): No    Lack of Transportation (Non-Medical): No  Physical Activity: Not on file  Stress: Not on file (12/05/2022)  Social Connections: Socially Integrated (11/10/2023)   Social Connection and Isolation Panel    Frequency of  Communication with Friends and Family: More than three times a week    Frequency of Social Gatherings with Friends and Family: More than three times a week    Attends Religious Services: More than 4 times per year    Active Member of [employer] or Organizations: Yes    Attends Engineer, structural: More than 4 times per year    Marital Status: Married  Catering manager Violence: Not At Risk (11/13/2023)   Humiliation, Afraid, Rape, and Kick questionnaire    Fear of Current or Ex-Partner: No    Emotionally Abused: No    Physically Abused: No    Sexually Abused: No    Family History  Problem Relation Age of Onset   Healthy Mother    Healthy Father    Breast cancer Paternal Grandmother    Breast cancer Other        Maternal Great Aunt   Allergies  Allergen Reactions   Adhesive [Tape] Swelling, Dermatitis and Other (See Comments)    Per the patient, only the 12 lead ekg pads cause welts,  blisters and abrasions -ALSO, SOME BAND-AIDS, as well- OK TO USE PAPER TAPE   Alpha-Gal Anaphylaxis, Hives and Swelling   Other Anaphylaxis, Nausea And Vomiting and Other (See Comments)    RED MEAT-ANAPHYLAXIS  General Anesthesia - nausea and vomiting (scopolamine  patch doesn't work)   Vancomycin  Dermatitis, Hives, Itching, Anaphylaxis and Other (See Comments)    Other Reaction(s): rash;welts   Bactrim  [Sulfamethoxazole -Trimethoprim ] Rash   Metformin And Related Other (See Comments)    GI upset    Silicone Dermatitis and Other (See Comments)    redness   I? Current Facility-Administered Medications  Medication Dose Route Frequency Provider Last Rate Last Admin   acetaminophen  (TYLENOL ) tablet 650 mg  650 mg Oral Q6H PRN Gherghe, Costin M, MD   650 mg at 11/17/23 9173   Or   acetaminophen  (TYLENOL ) suppository 650 mg  650 mg Rectal Q6H PRN Gherghe, Costin M, MD       aspirin tablet 325 mg  325 mg Oral Daily Gerkin, Todd, MD   325 mg at 11/17/23 0802   bisacodyl  (DULCOLAX) EC tablet 5 mg  5 mg Oral Daily PRN Fausto Sor A, DO       cefTRIAXone  (ROCEPHIN ) 2 g in sodium chloride  0.9 % 100 mL IVPB  2 g Intravenous Q24H Odell Celinda Balo, MD   Stopped at 11/17/23 1608   cyclobenzaprine  (FLEXERIL ) tablet 5 mg  5 mg Oral Daily PRN Gherghe, Costin M, MD   5 mg at 11/16/23 2046   diphenhydrAMINE  (BENADRYL ) capsule 25 mg  25 mg Oral Q6H PRN Odell Celinda Balo, MD   25 mg at 11/17/23 1556   famotidine  (PEPCID ) tablet 20 mg  20 mg Oral BID Fausto Sor A, DO   20 mg at 11/17/23 2026   fluconazole  (DIFLUCAN ) IVPB 200 mg  200 mg Intravenous Q24H Odell Celinda Balo, MD 100 mL/hr at 11/17/23 1224 200 mg at 11/17/23 1224   gabapentin  (NEURONTIN ) capsule 300 mg  300 mg Oral QHS PRN Gherghe, Costin M, MD   300 mg at 11/17/23 2030   hydrocortisone cream 1 %   Topical BID PRN Odell Celinda Balo, MD   Given at 11/17/23 1853   linezolid (ZYVOX) tablet 600 mg  600 mg Oral Q12H Odell Celinda Balo, MD   600 mg at 11/17/23 2027   magnesium hydroxide (MILK OF MAGNESIA) suspension 15 mL  15 mL Oral Daily PRN  Fausto Sor A, DO       ondansetron  (ZOFRAN ) tablet 4 mg  4 mg Oral Q6H PRN Gherghe, Costin M, MD   4 mg at 11/14/23 1237   Or   ondansetron  (ZOFRAN ) injection 4 mg  4 mg Intravenous Q6H PRN Gherghe, Costin M, MD   4 mg at 11/17/23 1130   oxyCODONE  (Oxy IR/ROXICODONE ) immediate release tablet 5-10 mg  5-10 mg Oral Q6H PRN Gherghe, Costin M, MD   10 mg at 11/16/23 1617   phenazopyridine  (PYRIDIUM ) tablet 200 mg  200 mg Oral TID WC Odell Celinda Balo, MD   200 mg at 11/17/23 1545   polyethylene glycol (MIRALAX / GLYCOLAX) packet 17 g  17 g Oral Daily Fausto Sor A, DO   17 g at 11/17/23 0802   senna-docusate (Senokot-S) tablet 1 tablet  1 tablet Oral BID Fausto Sor A, DO   1 tablet at 11/17/23 0802     Abtx:  Anti-infectives (From admission, onward)    Start     Dose/Rate Route Frequency Ordered Stop   11/16/23 2200  linezolid (ZYVOX) tablet 600 mg        600 mg Oral Every 12 hours 11/16/23 1508     11/16/23 1400  fluconazole  (DIFLUCAN ) IVPB 200 mg        200 mg 100 mL/hr over 60 Minutes Intravenous Every 24 hours 11/16/23 1223     11/16/23 1230  fluconazole  (DIFLUCAN ) IVPB 100 mg  Status:  Discontinued        100 mg 50 mL/hr over 60 Minutes Intravenous Every 24 hours 11/16/23 1140 11/16/23 1222   11/16/23 1000  cefTRIAXone  (ROCEPHIN ) 2 g in sodium chloride  0.9 % 100 mL IVPB        2 g 200 mL/hr over 30 Minutes Intravenous Every 24 hours 11/16/23 0848     11/13/23 2030  linezolid (ZYVOX) IVPB 600 mg  Status:  Discontinued        600 mg 300 mL/hr over 60 Minutes Intravenous Every 12 hours 11/13/23 1934 11/16/23 1508   11/13/23 1445  cefTRIAXone  (ROCEPHIN ) 2 g in sodium chloride  0.9 % 100 mL IVPB        2 g 200 mL/hr over 30 Minutes Intravenous  Once 11/13/23 1439 11/13/23 1552   11/13/23 1445  linezolid (ZYVOX) IVPB 600 mg  Status:  Discontinued        600  mg 300 mL/hr over 60 Minutes Intravenous Every 12 hours 11/13/23 1439 11/13/23 1602       REVIEW OF SYSTEMS:  Const: negative fever, negative chills, 25 pound weight loss since August 25, 2023 when she had developed cough choking and later found to have thyroid  nodules for which she had undergone surgery  Eyes: negative diplopia or visual changes, negative eye pain ENT: negative coryza, negative sore throat Resp: Cough is much improved Cards: negative for chest pain, palpitations, lower extremity edema GU: negative for frequency, dysuria and hematuria GI: Negative for abdominal pain, diarrhea, bleeding, constipation Skin: negative for rash and pruritus Heme: negative for easy bruising and gum/nose bleeding MS: negative for myalgias, arthralgias, back pain and muscle weakness Neurolo:negative for headaches, dizziness, vertigo, memory problems  Psych:  anxiety,  Endocrine: negative for thyroid , diabetes Allergy/Immunology-vancomycin  and Bactrim : Objective:  VITALS:  BP (!) 133/101 (BP Location: Right Arm)   Pulse 80   Temp 98 F (36.7 C)   Resp 16   Ht 5' 1 (1.549 m)   Wt 84.4 kg   LMP 09/29/2020 (  Approximate) Comment: PCOS  SpO2 97%   BMI 35.14 kg/m   PHYSICAL EXAM:  General: Alert, cooperative, no distress,   Head: Normocephalic, without obvious abnormality, atraumatic. Eyes: Conjunctivae clear, anicteric sclerae. Pupils are equal ENT Nares normal. No drainage or sinus tenderness. Lips, mucosa, and tongue normal. No Thrush Neck: There is a surgical incision site of the thyroid  area This is red all around And there is mild opening at the 2 spots with some slough     Back: No CVA tenderness. Lungs: Clear to auscultation bilaterally. No Wheezing or Rhonchi. No rales. Heart: Regular rate and rhythm, no murmur, rub or gallop. Abdomen: Soft, non-tender,not distended. Bowel sounds normal. No masses Extremities: Left forearm.  Phlebitic cord present Skin: Scar seen in  some areas from previous wounds  Lymph: Cervical, supraclavicular normal. Neurologic: Grossly non-focal Pertinent Labs Lab Results CBC    Component Value Date/Time   WBC 9.9 11/16/2023 0504   RBC 4.47 11/16/2023 0504   HGB 13.0 11/16/2023 0504   HGB 14.2 03/08/2018 1654   HCT 41.2 11/16/2023 0504   HCT 42.6 03/08/2018 1654   PLT 312 11/16/2023 0504   PLT 368 03/08/2018 1654   MCV 92.2 11/16/2023 0504   MCV 82 03/08/2018 1654   MCH 29.1 11/16/2023 0504   MCHC 31.6 11/16/2023 0504   RDW 13.1 11/16/2023 0504   RDW 13.4 03/08/2018 1654   LYMPHSABS 1.6 11/14/2023 0421   LYMPHSABS 4.6 (H) 03/08/2018 1654   MONOABS 0.5 11/14/2023 0421   EOSABS 0.0 11/14/2023 0421   EOSABS 0.2 03/08/2018 1654   BASOSABS 0.0 11/14/2023 0421   BASOSABS 0.1 03/08/2018 1654       Latest Ref Rng & Units 11/14/2023    4:21 AM 11/13/2023    3:00 PM 11/13/2023   11:41 AM  CMP  Glucose 70 - 99 mg/dL 830  75  83   BUN 6 - 20 mg/dL 9  10  10    Creatinine 0.44 - 1.00 mg/dL 9.24  9.29  9.26   Sodium 135 - 145 mmol/L 140  139  144   Potassium 3.5 - 5.1 mmol/L 4.2  4.5  3.3   Chloride 98 - 111 mmol/L 102  106  104   CO2 22 - 32 mmol/L 26   26   Calcium 8.9 - 10.3 mg/dL 9.1   9.0   Total Protein 6.5 - 8.1 g/dL   6.9   Total Bilirubin 0.0 - 1.2 mg/dL   0.9   Alkaline Phos 38 - 126 U/L   90   AST 15 - 41 U/L   17   ALT 0 - 44 U/L   15       Microbiology: Recent Results (from the past 240 hours)  Urine Culture     Status: None   Collection Time: 11/13/23 11:42 AM   Specimen: Urine, Random  Result Value Ref Range Status   Specimen Description   Final    URINE, RANDOM Performed at [facility], [address], [place], [place] [address]    Special Requests   Final    NONE Reflexed from [phone] Performed at [facility], [address], [place], [place] [address]    Culture   Final    NO GROWTH Performed at [facility], [address]  [place], [place] [address]    Report Status 11/15/2023 FINAL  Final  Culture, blood (Routine x 2)     Status: None (Preliminary result)  Collection Time: 11/13/23 11:43 AM   Specimen: BLOOD LEFT HAND  Result Value Ref Range Status   Specimen Description   Final    BLOOD LEFT HAND Performed at [facility], [address] [place], [place] [address]    Special Requests   Final    BOTTLES DRAWN AEROBIC AND ANAEROBIC Blood Culture results may not be optimal due to an inadequate volume of blood received in culture bottles Performed at [facility], [address], [place], [place] [address]    Culture   Final    NO GROWTH 4 DAYS Performed at [facility], [address] [place], [place] [address]    Report Status PENDING  Incomplete  Culture, blood (Routine x 2)     Status: None (Preliminary result)   Collection Time: 11/13/23  5:08 PM   Specimen: BLOOD LEFT ARM  Result Value Ref Range Status   Specimen Description   Final    BLOOD LEFT ARM Performed at [facility], [address], [place], [place] [address]    Special Requests   Final    BOTTLES DRAWN AEROBIC AND ANAEROBIC Blood Culture results may not be optimal due to an inadequate volume of blood received in culture bottles Performed at [facility], [address] [place], [place] [address]    Culture   Final    NO GROWTH 4 DAYS Performed at [facility], [address], [place], [place] [address]    Report Status PENDING  Incomplete      IMAGING RESULTS:CT neck with contrast shows skin thickening along the anterior lower neck there are areas of ill-defined fluid within the deep subcutaneous tissue of the anterior neck extending towards the thyroidectomy bed.  Few locules of gas which are favored to be postoperative in etiology.  Findings concerning more for postoperative changes with possible development of phlegmon/cellulitis.  There is no peripherally enhancing  fluid collection to suggest abscess formation.   I have personally reviewed the films ? Impression/Recommendation Recent right hemithyroidectomy on 11/10/2023 Erythema and swelling at the incision site Cellulitis of the surgical site Patient is currently on linezolid and ceftriaxone  has been added since last night- common organisms are skin bacterial like Staphylococcus and strep and linezolid is an excellent antibiotic Surprisingly  there is minimal improvement.  Patient has no fever or leukocytosis. At this moment there is nothing to culture. Tried to reach to Dr. Mellissa but he is on vacation As per his note he wanted the patient to be discharged on p.o. antibiotics to complete the course for cellulitis Tomorrow we will assess the patient again to see whether there is any discharge or pus for culture Otherwise patient could be discharged on p.o. linezolid and follow-up with Dr. Eletha Patient is concerned that they are of 4 out of 5 papules below the surgical site As of now there is does not look like any specific pathology Will need to see how it evolves If there is a concern that this is a reaction to  foreign body the sutures, we will still have to treat with antibiotics and see how it evolves  Left forearm thrombophlebitis.  Improving  CT scan showed 9 mm left renal pelvis calculus without hydronephrosis and additional small bilateral renal calculi.  This consult involve complex antimicrobial management.  I have personally spent  -60--minutes involved in face-to-face and non-face-to-face activities for this patient on the day of the visit. Professional time  spent includes the following activities: Preparing to see the patient (review of tests), Obtaining and/or reviewing separately obtained history (admission/discharge record), Performing a medically appropriate examination and/or evaluation , Ordering medications/tests/procedures, referring and communicating with other health care  professionals, Documenting clinical information in the EMR, Independently interpreting results (not separately reported), Communicating results to the patient/, Counseling and educating the patient/ and Care coordination (not separately reported).    ________________________________________________  Note:  This document was prepared using Conservation officer, historic buildings and may include unintentional dictation errors.

## 2023-11-18 ENCOUNTER — Other Ambulatory Visit (HOSPITAL_COMMUNITY): Payer: Self-pay

## 2023-11-18 DIAGNOSIS — N200 Calculus of kidney: Secondary | ICD-10-CM | POA: Diagnosis not present

## 2023-11-18 DIAGNOSIS — I808 Phlebitis and thrombophlebitis of other sites: Secondary | ICD-10-CM | POA: Diagnosis not present

## 2023-11-18 DIAGNOSIS — L03221 Cellulitis of neck: Secondary | ICD-10-CM | POA: Diagnosis not present

## 2023-11-18 LAB — CULTURE, BLOOD (ROUTINE X 2)
Culture: NO GROWTH
Culture: NO GROWTH

## 2023-11-18 MED ORDER — LINEZOLID 600 MG PO TABS: 600.0000 mg | ORAL_TABLET | Freq: Two times a day (BID) | ORAL | 0 refills | Status: DC

## 2023-11-18 MED ORDER — LINEZOLID 600 MG PO TABS
600.0000 mg | ORAL_TABLET | Freq: Two times a day (BID) | ORAL | 0 refills | Status: AC
  Filled 2023-11-18: qty 10, 5d supply, fill #0

## 2023-11-18 MED ORDER — AMOXICILLIN-POT CLAVULANATE 875-125 MG PO TABS
1.0000 | ORAL_TABLET | Freq: Two times a day (BID) | ORAL | 0 refills | Status: AC
  Filled 2023-11-18: qty 10, 5d supply, fill #0

## 2023-11-18 MED ORDER — AMOXICILLIN-POT CLAVULANATE 875-125 MG PO TABS
1.0000 | ORAL_TABLET | Freq: Two times a day (BID) | ORAL | Status: DC
  Administered 2023-11-18: 1 via ORAL
  Filled 2023-11-18: qty 1

## 2023-11-18 NOTE — Progress Notes (Signed)
 [facility] PROGRESS NOTE    Progress Note  Kayla Sexton  FMW:[id number] DOB: 04/29/1980 DOA: 11/13/2023 PCP: Rilla Baller, MD     Brief Narrative:   Kayla Sexton is an 43 y.o. female past medical history significant for multiple thyroid  nodules status post left thyroid  lobectomy on 11/10/2023, with cervical intraepithelial neoplasm grade 3 comes into the hospital 3 days post op for increased left arm swelling pain, she also has redness at the surgical site General Surgery was consulted started empirically on antibiotics. CT scan of the the neck showed stranding with ill-defined fluid with skin thickening at the surgical site.     Assessment/Plan:   Neck cellulitis early phlegmon: Status post thyroid  surgery on 10/31/2023. Continue oral linezolid. Infectious ease was consulted, awaiting infectious disease further recommendations. Can switch her to p.o. antibiotics for 14 days.  Left forearm superficial thrombophlebitis: Elevate arm continue PPI.  Recurrent UTI in the setting of bilateral nephrolithiasis: Case discussed with urology recommended no intervention at this time.  Follow-up with them as an outpatient. Continue IV Rocephin  and Diflucan . She will need to follow-up with urology as an outpatient.  Hypocalcemia: Repeated was unremarkable.  Obstructive sleep apnea: Obtain CPAP.  Hyperlipidemia: Noted.  Morbid obesity class II: Noted.     DVT prophylaxis: lovenox Family Communication:none Status is: Observation     Code Status:     Code Status Orders  (From admission, onward)           Start     Ordered   11/13/23 1505  Full code  Continuous       Question:  By:  Answer:  Consent: discussion documented in EHR   11/13/23 1504           Code Status History     Date Active Date Inactive Code Status Order ID Comments User Context   11/10/2023 1448 11/11/2023 1536 Full Code [id number]  Eletha Boas, MD Inpatient   09/06/2023 1438  09/07/2023 1427 Full Code [id number]  Celinda Alm Lot, MD ED         IV Access:   Peripheral IV   Procedures and diagnostic studies:   No results found.    Medical Consultants:   None.   Subjective:    Kayla Sexton still complaining about everything  Objective:    Vitals:   11/16/23 2029 11/17/23 0502 11/17/23 1357 11/17/23 2115  BP: 123/86 117/77 (!) 133/101 (!) 144/99  Pulse: 85 86 80 89  Resp: 16 16 16 14   Temp: 98.5 F (36.9 C) 98.2 F (36.8 C) 98 F (36.7 C) 98.3 F (36.8 C)  TempSrc: Oral Oral    SpO2: 96% 99% 97% 95%  Weight:      Height:       SpO2: 95 %   Intake/Output Summary (Last 24 hours) at 11/18/2023 1004 Last data filed at 11/18/2023 0644 Gross per 24 hour  Intake 220 ml  Output --  Net 220 ml   Filed Weights   11/13/23 1058  Weight: 84.4 kg    Exam: General exam: In no acute distress. Respiratory system: Good air movement and clear to auscultation. Cardiovascular system: S1 & S2 heard, RRR. No JVD. Gastrointestinal system: Abdomen is nondistended, soft and nontender.  Central nervous system: Alert and oriented. No focal neurological deficits. Extremities: No pedal edema. Skin: No rashes, lesions or ulcers Psychiatry: Judgement and insight appear normal. Mood & affect appropriate.  Data Reviewed:    Labs: Basic Metabolic Panel: Recent Labs  Lab 11/13/23 1141 11/13/23 1500 11/14/23 0421  NA 144 139 140  K 3.3* 4.5 4.2  CL 104 106 102  CO2 26  --  26  GLUCOSE 83 75 169*  BUN 10 10 9   CREATININE 0.73 0.70 0.75  CALCIUM 9.0  --  9.1   GFR Estimated Creatinine Clearance: 89.3 mL/min (by C-G formula based on SCr of 0.75 mg/dL). Liver Function Tests: Recent Labs  Lab 11/13/23 1141  AST 17  ALT 15  ALKPHOS 90  BILITOT 0.9  PROT 6.9  ALBUMIN 4.1   No results for input(s): LIPASE, AMYLASE in the last 168 hours. No results for input(s): AMMONIA in the last 168 hours. Coagulation profile Recent Labs   Lab 11/13/23 1141  INR 0.9   COVID-19 Labs  No results for input(s): DDIMER, FERRITIN, LDH, CRP in the last 72 hours.  Lab Results  Component Value Date   SARSCOV2NAA Not Detected 07/09/2020   SARSCOV2NAA NEGATIVE 01/01/2020    CBC: Recent Labs  Lab 11/13/23 1141 11/13/23 1500 11/14/23 0421 11/16/23 0504  WBC 10.3  --  11.0* 9.9  NEUTROABS 4.8  --  8.8*  --   HGB 14.0 14.3 14.0 13.0  HCT 44.0 42.0 43.5 41.2  MCV 87.8  --  89.7 92.2  PLT 294  --  341 312   Cardiac Enzymes: No results for input(s): CKTOTAL, CKMB, CKMBINDEX, TROPONINI in the last 168 hours. BNP (last 3 results) No results for input(s): PROBNP in the last 8760 hours. CBG: No results for input(s): GLUCAP in the last 168 hours. D-Dimer: No results for input(s): DDIMER in the last 72 hours. Hgb A1c: No results for input(s): HGBA1C in the last 72 hours. Lipid Profile: No results for input(s): CHOL, HDL, LDLCALC, TRIG, CHOLHDL, LDLDIRECT in the last 72 hours. Thyroid  function studies: No results for input(s): TSH, T4TOTAL, T3FREE, THYROIDAB in the last 72 hours.  Invalid input(s): FREET3  Anemia work up: No results for input(s): VITAMINB12, FOLATE, FERRITIN, TIBC, IRON, RETICCTPCT in the last 72 hours. Sepsis Labs: Recent Labs  Lab 11/13/23 1108 11/13/23 1141 11/13/23 1326 11/14/23 0421 11/16/23 0504  WBC  --  10.3  --  11.0* 9.9  LATICACIDVEN 1.5  --  1.4  --   --    Microbiology Recent Results (from the past 240 hours)  Urine Culture     Status: None   Collection Time: 11/13/23 11:42 AM   Specimen: Urine, Random  Result Value Ref Range Status   Specimen Description   Final    URINE, RANDOM Performed at [facility], [address], [place], [place] [address]    Special Requests   Final    NONE Reflexed from [id number] Performed at [facility], [address] [place], [place] [address]     Culture   Final    NO GROWTH Performed at [facility], [address] [place], [place] [address]    Report Status 11/15/2023 FINAL  Final  Culture, blood (Routine x 2)     Status: None   Collection Time: 11/13/23 11:43 AM   Specimen: BLOOD LEFT HAND  Result Value Ref Range Status   Specimen Description   Final    BLOOD LEFT HAND Performed at [facility], [address] [place], [place] [address]    Special Requests   Final    BOTTLES DRAWN AEROBIC AND ANAEROBIC Blood Culture results may not be optimal due to an inadequate volume of  blood received in culture bottles Performed at [facility], [address], [place], [place] [address]    Culture   Final    NO GROWTH 5 DAYS Performed at [facility], [address], [place], [place] [address]    Report Status 11/18/2023 FINAL  Final  Culture, blood (Routine x 2)     Status: None   Collection Time: 11/13/23  5:08 PM   Specimen: BLOOD LEFT ARM  Result Value Ref Range Status   Specimen Description   Final    BLOOD LEFT ARM Performed at [facility], [address] [place], [place] [address]    Special Requests   Final    BOTTLES DRAWN AEROBIC AND ANAEROBIC Blood Culture results may not be optimal due to an inadequate volume of blood received in culture bottles Performed at [facility], [address], [place], [place] [address]    Culture   Final    NO GROWTH 5 DAYS Performed at [facility], [address] [place], [place] [address]    Report Status 11/18/2023 FINAL  Final     Medications:    aspirin  325 mg Oral Daily   famotidine   20 mg Oral BID   linezolid  600 mg Oral Q12H   phenazopyridine   200 mg Oral TID WC   polyethylene glycol  17 g Oral Daily   senna-docusate  1 tablet Oral BID   Continuous Infusions:  cefTRIAXone  (ROCEPHIN )  IV 2 g (11/18/23 0836)   fluconazole  (DIFLUCAN ) IV 200 mg (11/17/23 1224)      LOS: 0 days   Erle Odell Castor  [facility]  11/18/2023, 10:04 AM

## 2023-11-18 NOTE — Progress Notes (Signed)
 Patient discharged home, IV removed, discharge paperwork provided and explained, patient verbalized understanding.

## 2023-11-18 NOTE — Discharge Summary (Signed)
 Physician Discharge Summary  Kayla Sexton FMW:[id number] DOB: 02-Jul-1980 DOA: 11/13/2023  PCP: Rilla Baller, MD  Admit date: 11/13/2023 Discharge date: 11/18/2023  Admitted From: Home Disposition:  Home  Recommendations for Outpatient Follow-up:  Follow up with general surgery in 1-2 weeks Please obtain BMP/CBC in one week   Home Health:No Equipment/Devices:None  Discharge Condition:Stable CODE STATUS:Full Diet recommendation:   Brief/Interim Summary: 43 y.o. female past medical history significant for multiple thyroid  nodules status post left thyroid  lobectomy on 11/10/2023, with cervical intraepithelial neoplasm grade 3 comes into the hospital 3 days post op for increased left arm swelling pain, she also has redness at the surgical site General Surgery was consulted started empirically on antibiotics. CT scan of the the neck showed stranding with ill-defined fluid with skin thickening at the surgical site.    Discharge Diagnoses:  Principal Problem:   Cellulitis Active Problems:   Postoperative infection  Neck cellulitis: She is status post thyroid  surgery on 10/31/2023. She will start empirically on antibiotic General Surgery was consulted recommended conservative treat. CT showed no abscess. Infectious ease was consulted who agreed with oral linezolid and Augmentin  as an outpatient she will follow-up with surgery as an outpatient. Will have to be careful with the patient as she tends to confused what explained to her we need to be clear and concise as if not she will give you the wrong interpretation of things that have been explained to her or what she thinks she was told.  Left arm thrombophlebitis: Now improved.  Recurrent UTI in the setting of bilateral nephrolithiasis: Follow-up with urology as an outpatient. Augmentin  should cover.  Hypocalcemia: Repleted now improved.  Sleep apnea: Continue CPAP.  Morbid obesity: Noted.  Discharge  Instructions  Discharge Instructions     Diet - low sodium heart healthy   Complete by: As directed    Increase activity slowly   Complete by: As directed    No wound care   Complete by: As directed       Allergies as of 11/18/2023       Reactions   Adhesive [tape] Swelling, Dermatitis, Other (See Comments)   Per the patient, only the 12 lead ekg pads cause welts, blisters and abrasions -ALSO, SOME BAND-AIDS, as well- OK TO USE PAPER TAPE   Alpha-gal Anaphylaxis, Hives, Swelling   Other Anaphylaxis, Nausea And Vomiting, Other (See Comments)   RED MEAT-ANAPHYLAXIS General Anesthesia - nausea and vomiting (scopolamine  patch doesn't work)   Vancomycin  Dermatitis, Hives, Itching, Anaphylaxis, Other (See Comments)   Other Reaction(s): rash;welts   Bactrim  [sulfamethoxazole -trimethoprim ] Rash   Metformin And Related Other (See Comments)   GI upset    Silicone Dermatitis, Other (See Comments)   redness        Medication List     TAKE these medications    Aleve 220 MG tablet Generic drug: naproxen sodium Take 220-440 mg by mouth 2 (two) times daily as needed (for pain).   amoxicillin -clavulanate 875-125 MG tablet Commonly known as: AUGMENTIN  Take 1 tablet by mouth every 12 (twelve) hours for 5 days.   cyclobenzaprine  5 MG tablet Commonly known as: FLEXERIL  Take 5 mg by mouth daily as needed for muscle spasms.   famotidine  20 MG tablet Commonly known as: PEPCID  TAKE 1 TABLET BY MOUTH TWICE A DAY What changed:  when to take this reasons to take this additional instructions   gabapentin  300 MG capsule Commonly known as: NEURONTIN  Take 1 capsule (300 mg total) by mouth at bedtime. What  changed:  when to take this reasons to take this   linezolid 600 MG tablet Commonly known as: ZYVOX Take 1 tablet (600 mg total) by mouth every 12 (twelve) hours for 7 days.   oxyCODONE  5 MG immediate release tablet Commonly known as: Oxy IR/ROXICODONE  Take 1-2 tablets (5-10  mg total) by mouth every 6 (six) hours as needed for moderate pain (pain score 4-6).        Follow-up Information     Delia Ole ORN, NP Follow up.   Specialty: Urology Contact information: [address] [place] [address] [phone]                Allergies  Allergen Reactions   Adhesive [Tape] Swelling, Dermatitis and Other (See Comments)    Per the patient, only the 12 lead ekg pads cause welts, blisters and abrasions -ALSO, SOME BAND-AIDS, as well- OK TO USE PAPER TAPE   Alpha-Gal Anaphylaxis, Hives and Swelling   Other Anaphylaxis, Nausea And Vomiting and Other (See Comments)    RED MEAT-ANAPHYLAXIS  General Anesthesia - nausea and vomiting (scopolamine  patch doesn't work)   Vancomycin  Dermatitis, Hives, Itching, Anaphylaxis and Other (See Comments)    Other Reaction(s): rash;welts   Bactrim  [Sulfamethoxazole -Trimethoprim ] Rash   Metformin And Related Other (See Comments)    GI upset    Silicone Dermatitis and Other (See Comments)    redness    Consultations: General Surgery Infectious disease   Procedures/Studies: CT RENAL STONE STUDY Result Date: 11/14/2023 CLINICAL DATA:  Abdominal/flank pain. EXAM: CT ABDOMEN AND PELVIS WITHOUT CONTRAST TECHNIQUE: Multidetector CT imaging of the abdomen and pelvis was performed following the standard protocol without IV contrast. RADIATION DOSE REDUCTION: This exam was performed according to the departmental dose-optimization program which includes automated exposure control, adjustment of the mA and/or kV according to patient size and/or use of iterative reconstruction technique. COMPARISON:  03/21/2023 FINDINGS: Lower chest: Normal-sized heart.  Minimal bibasilar atelectasis. Hepatobiliary: No focal liver abnormality is seen. No gallstones, gallbladder wall thickening, or biliary dilatation. Pancreas: Unremarkable. No pancreatic ductal dilatation or surrounding inflammatory changes. Spleen: Normal in  size without focal abnormality. Adrenals/Urinary Tract: Normal-appearing adrenal glands. 9 mm left renal pelvis calculus. 2 mm lower pole left renal calculus and 1 mm upper pole left renal calculus. 1 mm lower pole right renal calculus. Normal-appearing ureters and urinary bladder. No hydronephrosis. Stomach/Bowel: 7 mm linear appendicoliths in the mid to distal appendix without evidence of appendicitis. Unremarkable stomach, small bowel and colon. Vascular/Lymphatic: No significant vascular findings are present. No enlarged abdominal or pelvic lymph nodes. Reproductive: Status post hysterectomy. No adnexal masses. Other: No abdominal wall hernia or abnormality. No abdominopelvic ascites. Musculoskeletal: Minimal lumbar and mild lower thoracic spine degenerative changes. IMPRESSION: 1. 9 mm left renal pelvis calculus without hydronephrosis. 2. Additional small bilateral renal calculi. 3. 7 mm linear appendicolith without evidence of appendicitis. Electronically Signed   By: Elspeth Bathe M.D.   On: 11/14/2023 14:46   CT Soft Tissue Neck W Contrast Result Date: 11/13/2023 EXAM: CT NECK WITH CONTRAST 11/13/2023 01:29:59 PM TECHNIQUE: CT of the neck was performed with the administration of 100 mL of iohexol  (OMNIPAQUE ) 300 MG/ML solution. Multiplanar reformatted images are provided for review. Automated exposure control, iterative reconstruction, and/or weight based adjustment of the mA/kV was utilized to reduce the radiation dose to as low as reasonably achievable. COMPARISON: Prominent CTA head and neck 18774 and thyroid  ultrasound 81425. CLINICAL HISTORY: Postoperative neck pain, redness, swelling, recent left  thyroid  lobectomy. FINDINGS: AERODIGESTIVE TRACT: The nasopharynx and oropharynx are symmetric. Normal appearance of the tonsils. The oral cavity and floor of mouth are unremarkable. Normal appearance of the supraglottic airway. The vocal folds are symmetric. No significant narrowing of the extrathoracic  trachea adjacent to the thyroidectomy site. No discrete mass. No edema. SALIVARY GLANDS: The parotid and submandibular glands are unremarkable. THYROID : Interval postsurgical changes of left thyroidectomy. There is postoperative fluid in the thyroidectomy bed with adjacent surgical clips. Residual right thyroid  lobe is unremarkable in appearance. LYMPH NODES: No pathologically enlarged lymph nodes in the neck. SOFT TISSUES: Overlying stranding within the subcutaneous tissues. There is skin thickening along the anterior lower neck. There are areas of ill-defined fluid within the deeper subcutaneous tissues of the anterior neck extending towards the thyroidectomy bed. Few locules of gas which are favored to be postoperative in etiology. Findings concerning for postoperative changes with possible development of phlegmon/cellulitis. There is no peripherally enhancing fluid collection to suggest abscess formation. There are a few locules of gas noted extending superiorly and laterally within the subcutaneous tissues of the neck adjacent to the sternocleidomastoid muscles likely reflecting postoperative gas. There is no significantly associated stranding within this region. BRAIN, ORBITS, SINUSES AND MASTOIDS: No acute abnormality. LUNGS AND MEDIASTINUM: No acute abnormality. BONES: No focal bone abnormality. IMPRESSION: 1. Postoperative changes of left thyroidectomy. Adjacent subcutaneous stranding, ill-defined fluid, and skin thickening which likely reflects a combination of postoperative changes with superimposed developing cellulitis/phlegmon. 2. No abscess on the current study. 3. Additional postoperative gas locules in the subcutaneous tissues adjacent to the sternocleidomastoid muscles without significant associated stranding. 4. No cervical lymphadenopathy. Electronically signed by: Donnice Mania MD 11/13/2023 02:17 PM EDT RP Workstation: [id number]   (Echo, Carotid, EGD, Colonoscopy, ERCP)    Subjective: No  complaints.  Discharge Exam: Vitals:   11/17/23 1357 11/17/23 2115  BP: (!) 133/101 (!) 144/99  Pulse: 80 89  Resp: 16 14  Temp: 98 F (36.7 C) 98.3 F (36.8 C)  SpO2: 97% 95%   Vitals:   11/16/23 2029 11/17/23 0502 11/17/23 1357 11/17/23 2115  BP: 123/86 117/77 (!) 133/101 (!) 144/99  Pulse: 85 86 80 89  Resp: 16 16 16 14   Temp: 98.5 F (36.9 C) 98.2 F (36.8 C) 98 F (36.7 C) 98.3 F (36.8 C)  TempSrc: Oral Oral    SpO2: 96% 99% 97% 95%  Weight:      Height:        General: Pt is alert, awake, not in acute distress Cardiovascular: RRR, S1/S2 +, no rubs, no gallops Respiratory: CTA bilaterally, no wheezing, no rhonchi Abdominal: Soft, NT, ND, bowel sounds + Extremities: no edema, no cyanosis    The results of significant diagnostics from this hospitalization (including imaging, microbiology, ancillary and laboratory) are listed below for reference.     Microbiology: Recent Results (from the past 240 hours)  Urine Culture     Status: None   Collection Time: 11/13/23 11:42 AM   Specimen: Urine, Random  Result Value Ref Range Status   Specimen Description   Final    URINE, RANDOM Performed at [facility], [address] [place], [place] [address]    Special Requests   Final    NONE Reflexed from [id number] Performed at [facility], [address] [place], [place] [address]    Culture   Final    NO GROWTH Performed at [facility], [address] [place], [place] [address]  Report Status 11/15/2023 FINAL  Final  Culture, blood (Routine x 2)     Status: None   Collection Time: 11/13/23 11:43 AM   Specimen: BLOOD LEFT HAND  Result Value Ref Range Status   Specimen Description   Final    BLOOD LEFT HAND Performed at [facility], [address], [place], [place] [address]    Special Requests   Final    BOTTLES DRAWN AEROBIC AND ANAEROBIC Blood Culture results may not be optimal due to an inadequate  volume of blood received in culture bottles Performed at [facility], [address], [place], [place] [address]    Culture   Final    NO GROWTH 5 DAYS Performed at [facility], [address], [place], [place] [address]    Report Status 11/18/2023 FINAL  Final  Culture, blood (Routine x 2)     Status: None   Collection Time: 11/13/23  5:08 PM   Specimen: BLOOD LEFT ARM  Result Value Ref Range Status   Specimen Description   Final    BLOOD LEFT ARM Performed at [facility], [address] [place], [place] [address]    Special Requests   Final    BOTTLES DRAWN AEROBIC AND ANAEROBIC Blood Culture results may not be optimal due to an inadequate volume of blood received in culture bottles Performed at [facility], [address], [place], [place] [address]    Culture   Final    NO GROWTH 5 DAYS Performed at [facility], [address], [place], [place] [address]    Report Status 11/18/2023 FINAL  Final     Labs: BNP (last 3 results) No results for input(s): BNP in the last 8760 hours. Basic Metabolic Panel: Recent Labs  Lab 11/13/23 1141 11/13/23 1500 11/14/23 0421  NA 144 139 140  K 3.3* 4.5 4.2  CL 104 106 102  CO2 26  --  26  GLUCOSE 83 75 169*  BUN 10 10 9   CREATININE 0.73 0.70 0.75  CALCIUM 9.0  --  9.1   Liver Function Tests: Recent Labs  Lab 11/13/23 1141  AST 17  ALT 15  ALKPHOS 90  BILITOT 0.9  PROT 6.9  ALBUMIN 4.1   No results for input(s): LIPASE, AMYLASE in the last 168 hours. No results for input(s): AMMONIA in the last 168 hours. CBC: Recent Labs  Lab 11/13/23 1141 11/13/23 1500 11/14/23 0421 11/16/23 0504  WBC 10.3  --  11.0* 9.9  NEUTROABS 4.8  --  8.8*  --   HGB 14.0 14.3 14.0 13.0  HCT 44.0 42.0 43.5 41.2  MCV 87.8  --  89.7 92.2  PLT 294  --  341 312   Cardiac Enzymes: No results for input(s): CKTOTAL, CKMB, CKMBINDEX, TROPONINI in the last 168  hours. BNP: Invalid input(s): POCBNP CBG: No results for input(s): GLUCAP in the last 168 hours. D-Dimer No results for input(s): DDIMER in the last 72 hours. Hgb A1c No results for input(s): HGBA1C in the last 72 hours. Lipid Profile No results for input(s): CHOL, HDL, LDLCALC, TRIG, CHOLHDL, LDLDIRECT in the last 72 hours. Thyroid  function studies No results for input(s): TSH, T4TOTAL, T3FREE, THYROIDAB in the last 72 hours.  Invalid input(s): FREET3 Anemia work up No results for input(s): VITAMINB12, FOLATE, FERRITIN, TIBC, IRON, RETICCTPCT in the last 72 hours. Urinalysis    Component Value Date/Time   COLORURINE YELLOW 11/13/2023 1142  APPEARANCEUR CLEAR 11/13/2023 1142   APPEARANCEUR Clear 03/21/2023 1334   LABSPEC 1.011 11/13/2023 1142   LABSPEC 1.025 03/08/2018 1636   PHURINE 6.0 11/13/2023 1142   GLUCOSEU NEGATIVE 11/13/2023 1142   HGBUR SMALL (A) 11/13/2023 1142   BILIRUBINUR NEGATIVE 11/13/2023 1142   BILIRUBINUR negative 10/14/2023 1140   BILIRUBINUR Negative 03/21/2023 1334   KETONESUR NEGATIVE 11/13/2023 1142   PROTEINUR NEGATIVE 11/13/2023 1142   UROBILINOGEN 0.2 10/14/2023 1140   NITRITE NEGATIVE 11/13/2023 1142   LEUKOCYTESUR MODERATE (A) 11/13/2023 1142   Sepsis Labs Recent Labs  Lab 11/13/23 1141 11/14/23 0421 11/16/23 0504  WBC 10.3 11.0* 9.9   Microbiology Recent Results (from the past 240 hours)  Urine Culture     Status: None   Collection Time: 11/13/23 11:42 AM   Specimen: Urine, Random  Result Value Ref Range Status   Specimen Description   Final    URINE, RANDOM Performed at [facility], [address], [place], [place] [address]    Special Requests   Final    NONE Reflexed from [id number] Performed at [facility], [address], [place], [place] [address]    Culture   Final    NO GROWTH Performed at [facility], [address],  [place], [place] [address]    Report Status 11/15/2023 FINAL  Final  Culture, blood (Routine x 2)     Status: None   Collection Time: 11/13/23 11:43 AM   Specimen: BLOOD LEFT HAND  Result Value Ref Range Status   Specimen Description   Final    BLOOD LEFT HAND Performed at [facility], [address] [place], [place] [address]    Special Requests   Final    BOTTLES DRAWN AEROBIC AND ANAEROBIC Blood Culture results may not be optimal due to an inadequate volume of blood received in culture bottles Performed at [facility], [address], [place], [place] [address]    Culture   Final    NO GROWTH 5 DAYS Performed at [facility], [address], [place], [place] [address]    Report Status 11/18/2023 FINAL  Final  Culture, blood (Routine x 2)     Status: None   Collection Time: 11/13/23  5:08 PM   Specimen: BLOOD LEFT ARM  Result Value Ref Range Status   Specimen Description   Final    BLOOD LEFT ARM Performed at [facility], [address], [place], [place] [address]    Special Requests   Final    BOTTLES DRAWN AEROBIC AND ANAEROBIC Blood Culture results may not be optimal due to an inadequate volume of blood received in culture bottles Performed at [facility], [address] [place], [place] [address]    Culture   Final    NO GROWTH 5 DAYS Performed at [facility], [address], [place], [place] [address]    Report Status 11/18/2023 FINAL  Final     Time coordinating discharge: Over 35 minutes  SIGNED:   Erle Odell Castor, MD  [facility] 11/18/2023, 10:28 AM Pager   If 7PM-7AM, please contact night-coverage [email or web address] Password [email or web address]

## 2023-11-18 NOTE — Progress Notes (Signed)
 Discharge medications delivered to patient at the bedside in a secure bag.

## 2023-11-18 NOTE — Telephone Encounter (Unsigned)
 Copied from CRM [id number]. Topic: Appointments - Scheduling Inquiry for Clinic >> Nov 18, 2023 12:18 PM Kayla Sexton wrote: Reason for CRM: App set for 12/02/2023 for hospital discharge 11/18/2023, she would like a sooner appointment if possible.

## 2023-11-18 NOTE — Plan of Care (Signed)

## 2023-11-18 NOTE — Progress Notes (Addendum)
 Date of Admission:  11/13/2023      ID: [id number] KEADY is a 43 y.o. female  Principal Problem:   Cellulitis Active Problems:   Postoperative infection    Subjective: Doing better Still concerned about the paules But swelling less  Medications:   amoxicillin -clavulanate  1 tablet Oral Q12H   aspirin  325 mg Oral Daily   famotidine   20 mg Oral BID   linezolid  600 mg Oral Q12H   phenazopyridine   200 mg Oral TID WC   polyethylene glycol  17 g Oral Daily   senna-docusate  1 tablet Oral BID    Objective: Vital signs in last 24 hours: Patient Vitals for the past 24 hrs:  BP Temp Pulse Resp SpO2  11/17/23 2115 (!) 144/99 98.3 F (36.8 C) 89 14 95 %  11/17/23 1357 (!) 133/101 98 F (36.7 C) 80 16 97 %      PHYSICAL EXAM:  General: Alert, cooperative, no distress, appears stated age.   Lungs: Clear to auscultation bilaterally. No Wheezing or Rhonchi. No rales. Heart: Regular rate and rhythm, no murmur, rub or gallop. Abdomen: Soft, non-tender,not distended. Bowel sounds normal. No masses Extremities: atraumatic, no cyanosis. No edema. No clubbing Skin: No rashes or lesions. Or bruising Lymph: Cervical, supraclavicular normal. Neurologic: Grossly non-focal  Lab Results    Latest Ref Rng & Units 11/16/2023    5:04 AM 11/14/2023    4:21 AM 11/13/2023    3:00 PM  CBC  WBC 4.0 - 10.5 K/uL 9.9  11.0    Hemoglobin 12.0 - 15.0 g/dL 86.9  85.9  85.6   Hematocrit 36.0 - 46.0 % 41.2  43.5  42.0   Platelets 150 - 400 K/uL 312  341         Latest Ref Rng & Units 11/14/2023    4:21 AM 11/13/2023    3:00 PM 11/13/2023   11:41 AM  CMP  Glucose 70 - 99 mg/dL 830  75  83   BUN 6 - 20 mg/dL 9  10  10    Creatinine 0.44 - 1.00 mg/dL 9.24  9.29  9.26   Sodium 135 - 145 mmol/L 140  139  144   Potassium 3.5 - 5.1 mmol/L 4.2  4.5  3.3   Chloride 98 - 111 mmol/L 102  106  104   CO2 22 - 32 mmol/L 26   26   Calcium 8.9 - 10.3 mg/dL 9.1   9.0   Total Protein 6.5 - 8.1 g/dL    6.9   Total Bilirubin 0.0 - 1.2 mg/dL   0.9   Alkaline Phos 38 - 126 U/L   90   AST 15 - 41 U/L   17   ALT 0 - 44 U/L   15       Microbiology: [facility]    Assessment/Plan:  Recent right hemithyroidectomy on 11/10/2023 Cellulitis at the surgical site Improving on linezolid and ceftriaxone  Can be discharged on 5 more days of linezolid and PO augmentin  Follow up in my clinic D.D to this is reaction to sutires ? Sweets ( because of some papular eruptions alnd also she has past h/o of such lesions) ??? PG ??? Autoimmune - ANA    Left forearm thrombophlebitis.  Improving   CT scan showed 9 mm left renal pelvis calculus without hydronephrosis and additional small bilateral renal calculi.   Follow up appt given for next wed 10/29 with me Discussed with her in great detail  Discussed side effects and food and drinks to avoid Labs in 1 week

## 2023-11-21 ENCOUNTER — Encounter: Payer: Self-pay | Admitting: Family Medicine

## 2023-11-21 ENCOUNTER — Ambulatory Visit: Payer: Self-pay

## 2023-11-21 ENCOUNTER — Ambulatory Visit (INDEPENDENT_AMBULATORY_CARE_PROVIDER_SITE_OTHER): Admitting: Family Medicine

## 2023-11-21 ENCOUNTER — Ambulatory Visit (HOSPITAL_BASED_OUTPATIENT_CLINIC_OR_DEPARTMENT_OTHER)
Admission: RE | Admit: 2023-11-21 | Discharge: 2023-11-21 | Disposition: A | Discharge status: Home / Self Care | Source: Ambulatory Visit | Attending: Family Medicine | Admitting: Family Medicine

## 2023-11-21 VITALS — BP 118/78 | HR 102 | Temp 97.8°F | Ht 61.0 in | Wt 188.5 lb

## 2023-11-21 DIAGNOSIS — R7689 Other specified abnormal immunological findings in serum: Secondary | ICD-10-CM

## 2023-11-21 DIAGNOSIS — E041 Nontoxic single thyroid nodule: Secondary | ICD-10-CM

## 2023-11-21 DIAGNOSIS — R1031 Right lower quadrant pain: Secondary | ICD-10-CM | POA: Insufficient documentation

## 2023-11-21 DIAGNOSIS — E538 Deficiency of other specified B group vitamins: Secondary | ICD-10-CM

## 2023-11-21 DIAGNOSIS — R053 Chronic cough: Secondary | ICD-10-CM

## 2023-11-21 DIAGNOSIS — K219 Gastro-esophageal reflux disease without esophagitis: Secondary | ICD-10-CM

## 2023-11-21 DIAGNOSIS — L03221 Cellulitis of neck: Secondary | ICD-10-CM

## 2023-11-21 LAB — CBC WITH DIFFERENTIAL/PLATELET
Basophils Absolute: 0 K/uL (ref 0.0–0.1)
Basophils Relative: 0.5 % (ref 0.0–3.0)
Eosinophils Absolute: 0.3 K/uL (ref 0.0–0.7)
Eosinophils Relative: 3.3 % (ref 0.0–5.0)
HCT: 45.4 % (ref 36.0–46.0)
Hemoglobin: 15 g/dL (ref 12.0–15.0)
Lymphocytes Relative: 40.2 % (ref 12.0–46.0)
Lymphs Abs: 3.9 K/uL (ref 0.7–4.0)
MCHC: 33.1 g/dL (ref 30.0–36.0)
MCV: 87.3 fl (ref 78.0–100.0)
Monocytes Absolute: 0.7 K/uL (ref 0.1–1.0)
Monocytes Relative: 7.1 % (ref 3.0–12.0)
Neutro Abs: 4.7 K/uL (ref 1.4–7.7)
Neutrophils Relative %: 48.9 % (ref 43.0–77.0)
Platelets: 299 K/uL (ref 150.0–400.0)
RBC: 5.2 Mil/uL — ABNORMAL HIGH (ref 3.87–5.11)
RDW: 13.9 % (ref 11.5–15.5)
WBC: 9.6 K/uL (ref 4.0–10.5)

## 2023-11-21 LAB — POCT URINALYSIS DIPSTICK
Bilirubin, UA: NEGATIVE
Glucose, UA: NEGATIVE
Ketones, UA: NEGATIVE
Leukocytes, UA: NEGATIVE
Nitrite, UA: NEGATIVE
Protein, UA: POSITIVE — AB
Spec Grav, UA: >=1.03 — AB (ref 1.010–1.025)
Urobilinogen, UA: 0.2 U/dL
pH, UA: 5.5 (ref 5.0–8.0)

## 2023-11-21 LAB — COMPREHENSIVE METABOLIC PANEL WITH GFR
ALT: 14 U/L (ref 0–35)
AST: 14 U/L (ref 0–37)
Albumin: 4.2 g/dL (ref 3.5–5.2)
Alkaline Phosphatase: 77 U/L (ref 39–117)
BUN: 12 mg/dL (ref 6–23)
CO2: 27 meq/L (ref 19–32)
Calcium: 8.8 mg/dL (ref 8.4–10.5)
Chloride: 103 meq/L (ref 96–112)
Creatinine, Ser: 0.8 mg/dL (ref 0.40–1.20)
GFR: 90.19 mL/min (ref >60.00)
Glucose, Bld: 91 mg/dL (ref 70–99)
Potassium: 4.3 meq/L (ref 3.5–5.1)
Sodium: 141 meq/L (ref 135–145)
Total Bilirubin: 1 mg/dL (ref 0.2–1.2)
Total Protein: 7.3 g/dL (ref 6.0–8.3)

## 2023-11-21 MED ORDER — OMEPRAZOLE 40 MG PO CPDR: 40.0000 mg | DELAYED_RELEASE_CAPSULE | Freq: Every day | ORAL | 3 refills | Status: DC

## 2023-11-21 MED ORDER — GABAPENTIN 300 MG PO CAPS: 300.0000 mg | ORAL_CAPSULE | Freq: Every day | ORAL | 3 refills | Status: AC

## 2023-11-21 MED ORDER — IOHEXOL 300 MG/ML  SOLN
100.0000 mL | Freq: Once | INTRAMUSCULAR | Status: AC | PRN
  Administered 2023-11-21: 100 mL via INTRAVENOUS

## 2023-11-21 NOTE — Telephone Encounter (Signed)
 FYI Only or Action Required?: FYI only for provider.  Patient was last seen in primary care on 09/09/2023 by Rilla Baller, MD.  Called Nurse Triage reporting Abdominal Pain.  Symptoms began today.  Interventions attempted: Rest, hydration, or home remedies.  Symptoms are: gradually worsening.  Triage Disposition: See HCP Within 4 Hours (Or PCP Triage)  Patient/caregiver understands and will follow disposition?: Yes  Copied from CRM [id number]. Topic: Clinical - Red Word Triage >> Nov 21, 2023 11:05 AM Eva FALCON wrote: Red Word that prompted transfer to Nurse Triage: severe lower stomach pain near belly button, throwing up, fatigue, no appetite. Was released 10/24 from hospital after a week. Is concerned something with her appendix.  Reason for Disposition  [1] MILD-MODERATE pain AND [2] constant AND [3] present > 2 hours  Answer Assessment - Initial Assessment Questions Pt just in hospital from to 10/19 to 10/24 for cellulitis of arm.  1. LOCATION: Where does it hurt?      Middle of belly   2. RADIATION: Does the pain shoot anywhere else? (e.g., chest, back)     Radiates to right side  3. ONSET: When did the pain begin? (e.g., minutes, hours or days ago)      This morning  4. SUDDEN: Gradual or sudden onset?     Sudden  5. PATTERN Does the pain come and go, or is it constant?     Comes and goes  6. SEVERITY: How bad is the pain?  (e.g., Scale 1-10; mild, moderate, or severe)     Sharp stabbing pain, 6/10.   7. RECURRENT SYMPTOM: Have you ever had this type of stomach pain before? If Yes, ask: When was the last time? and What happened that time?      No  8. CAUSE: What do you think is causing the stomach pain? (e.g., gallstones, recent abdominal surgery)     Unsure. Thinks it may be related to her appendix.  9. RELIEVING/AGGRAVATING FACTORS: What makes it better or worse? (e.g., antacids, bending or twisting motion, bowel movement)     Laying  down and curling up in a ball with heating pad helps.  10. OTHER SYMPTOMS: Do you have any other symptoms? (e.g., back pain, diarrhea, fever, urination pain, vomiting)       Nausea, emesis x1 (yellow), fatigue, no appetite, feeling in a daze. Mildly diarrhea. Denies dizziness. Denies fever. Reports chills. Just had thyroid  surgery on 11/10/23.  11. PREGNANCY: Is there any chance you are pregnant? When was your last menstrual period?       No. Has had full hysterectomy.  Protocols used: Abdominal Pain - Female-A-AH

## 2023-11-21 NOTE — Patient Instructions (Signed)
 Labs today I will order STAT CT scan with contrast   VISIT SUMMARY:  During your visit, we discussed your abdominal pain, which began after your recent hospital discharge. You described the pain as a stabbing sensation under your belly button, radiating to the right side, and distinct from your usual kidney stone pain. You also experienced nausea, vomiting, and chills. We reviewed your history of recurrent urinary tract infections, kidney stones, and recent partial thyroidectomy. We also addressed your GERD, chronic cough, and left upper extremity superficial thrombophlebitis.  YOUR PLAN:  -ACUTE AND RECURRENT ABDOMINAL PAIN: Your abdominal pain, accompanied by nausea, vomiting, and chills, may be due to various causes, including appendicitis. We will perform blood tests (CBC and CMP), a urine test, and a contrasted CT scan of your abdomen to rule out appendicitis. In the meantime, follow a clear liquid diet for 1-2 days and avoid high fiber foods. If you eat solids, stick to bland foods like rice, bananas, applesauce, and toast.  -APPENDICOLITH: An appendicolith is a calcified deposit within the appendix that can lead to appendicitis. We will perform a contrasted CT scan of your abdomen to rule out appendicitis.  -LEFT RENAL PELVIC STONE AND BILATERAL NEPHROLITHIASIS: You have a 9 mm stone in your left kidney, which is causing distinct pain. You are under the care of a urologist to discuss potential surgical options for stone removal. Please follow up with your urologist on Wednesday.  -RECURRENT URINARY TRACT INFECTIONS: You have a history of recurrent urinary tract infections (UTIs) and are currently experiencing symptoms. We will perform a urine test to compare with previous hospital results.  -GASTROESOPHAGEAL REFLUX DISEASE (GERD): GERD is a condition where stomach acid frequently flows back into the esophagus, causing heartburn. Since you have persistent symptoms, we are prescribing  omeprazole 40 mg daily, to be taken 30 minutes before lunch. Continue taking Pepcid  at night.  -CHRONIC COUGH, LIKELY POST-INFECTIOUS AND/OR REFLUX-RELATED: Your chronic cough is likely related to post-infectious changes or GERD. Although it has improved since your thyroid  surgery, it remains bothersome. Try using the Airsupra inhaler once or twice daily for a few days to see if it helps with your cough.  -LEFT UPPER EXTREMITY SUPERFICIAL THROMBOPHLEBITIS: Superficial thrombophlebitis is inflammation of a vein just under the skin, often with a blood clot. Your condition is improving with low-dose aspirin and warm compresses. Continue taking low-dose aspirin 81 mg daily and apply warm compresses to the affected area.  -RECENT LEFT PARTIAL THYROIDECTOMY FOR THYROID  NODULES, STATUS POST NECK CELLULITIS: You recently had a partial thyroidectomy for benign thyroid  nodules, with postoperative neck cellulitis.  -POLYCYSTIC OVARY SYNDROME, STATUS POST HYSTERECTOMY: You have a history of polycystic ovary syndrome (PCOS) and had a hysterectomy in 2022.  -GENERAL HEALTH MAINTENANCE: We will schedule a rheumatology appointment, order a B12 level test before your next B12 shot, and order an ANA test.  INSTRUCTIONS:  Please follow up with your urologist on Wednesday to discuss surgical options for your kidney stone removal. Additionally, we will perform blood tests (CBC and CMP), a urine test, and a contrasted CT scan of your abdomen to rule out appendicitis. Continue with the prescribed medications and dietary recommendations. Schedule a rheumatology appointment, and we will order a B12 level test before your next B12 shot and an ANA test.

## 2023-11-21 NOTE — Telephone Encounter (Signed)
 Noted. I can't see her sooner as I'm out of office for most of this week.

## 2023-11-21 NOTE — Progress Notes (Unsigned)
 Ph: [phone] Fax: [phone]   Patient ID: Kayla Sexton Custard, female    DOB: November 04, 1980, 43 y.o.   MRN: [id number]  This visit was conducted in person.  BP 118/78   Pulse (!) 102   Temp 97.8 F (36.6 C) (Oral)   Ht 5' 1 (1.549 m)   Wt 188 lb 8 oz (85.5 kg)   LMP 09/29/2020 (Approximate) Comment: PCOS  SpO2 97%   BMI 35.62 kg/m    Chief Complaint  Patient presents with   Abdominal Pain    Pt here for Hospital F/U. and  abdominal pain. Pt states she has had N/V/D. Pt states sharp pains started this morning and has had a cough that's been going on since July.     Subjective:   Discussed the use of AI scribe software for clinical note transcription with the patient, who gave verbal consent to proceed.  History of Present Illness   Kayla Sexton is a 43 year old female who presents with abdominal pain.  She underwent a partial thyroidectomy on October 16th for benign thyroid  nodules. Postoperatively, she developed neck cellulitis and left arm pain and swelling, treated with oral linezolid and Augmentin . Left arm swelling is improving. She takes low-dose aspirin daily for thrombophlebitis.   Abdominal pain began after hospital discharge on October 17th. It is a stabbing sensation under the umbilicus, radiating to the right side, distinct from her usual kidney stone pain. The pain is severe, causing vomiting and associated with chills, but no fever. Nausea is present with one episode of vomiting. Gabapentin  and a heating pad provide some relief.  She has recurrent urinary tract infections and bilateral kidney stones. A CT scan on October 20th showed a 9 mm left renal pelvic stone and a 7 mm appendicolith without appendicitis. Dysuria and increased urinary frequency are present, but no urgency. She uses over-the-counter Pyridium  for symptom relief.      Out of work through 12/07/2023, has gen surgery f/u 12/08/2023.  Remote colonoscopy 2010 - benign polyps  LMP - s/p  hysterectomy for PCOS and ?ovarian cancer - all benign 2022     Relevant past medical, surgical, family and social history reviewed and updated as indicated. Interim medical history since our last visit reviewed. Allergies and medications reviewed and updated. Outpatient Medications Prior to Visit  Medication Sig Dispense Refill   ALEVE 220 MG tablet Take 220-440 mg by mouth 2 (two) times daily as needed (for pain).     amoxicillin -clavulanate (AUGMENTIN ) 875-125 MG tablet Take 1 tablet by mouth every 12 (twelve) hours for 5 days. 10 tablet 0   cyclobenzaprine  (FLEXERIL ) 5 MG tablet Take 5 mg by mouth daily as needed for muscle spasms.     famotidine  (PEPCID ) 20 MG tablet TAKE 1 TABLET BY MOUTH TWICE A DAY 180 tablet 1   linezolid (ZYVOX) 600 MG tablet Take 1 tablet (600 mg total) by mouth every 12 (twelve) hours for 5 days. 10 tablet 0   oxyCODONE  (OXY IR/ROXICODONE ) 5 MG immediate release tablet Take 1-2 tablets (5-10 mg total) by mouth every 6 (six) hours as needed for moderate pain (pain score 4-6). 15 tablet 0   gabapentin  (NEURONTIN ) 300 MG capsule Take 1 capsule (300 mg total) by mouth at bedtime. (Patient taking differently: Take 300 mg by mouth daily. Taking at night) 30 capsule 3   No facility-administered medications prior to visit.    Past Medical History - Thyroid  nodules with dysphagia, status post left partial thyroidectomy  with benign nodular follicular hyperplasia - Neck cellulitis post-thyroidectomy - Thrombophlebitis of the left arm - Recurrent urinary tract infections - Bilateral kidney stones, including a 9 mm left renal pelvic stone - Appendicolith without appendicitis - Hypertension - Chronic cough, possibly related to gastroesophageal reflux disease (GERD) - Polycystic ovary syndrome (PCOS) - History of benign colonic polyps  Surgical History: - Left partial thyroidectomy (October 16th, 2025): For multiple thyroid  nodules with benign pathology consistent with  nodular follicular hyperplasia - Hysterectomy (2022): Due to PCOS and suspicion of ovarian cancer, which was not present - Colonoscopy (Approximately 15 years ago): found benign polyps  Per HPI unless specifically indicated in ROS section below Review of Systems  Objective:  BP 118/78   Pulse (!) 102   Temp 97.8 F (36.6 C) (Oral)   Ht 5' 1 (1.549 m)   Wt 188 lb 8 oz (85.5 kg)   LMP 09/29/2020 (Approximate) Comment: PCOS  SpO2 97%   BMI 35.62 kg/m   Wt Readings from Last 3 Encounters:  11/21/23 188 lb 8 oz (85.5 kg)  11/13/23 186 lb (84.4 kg)  11/10/23 186 lb (84.4 kg)      Physical Exam   VITALS: BP- 166/108 CHEST: Lungs clear to auscultation bilaterally. ABDOMEN: Abdomen tender on palpation, worse on deep palpation.       Physical Exam Vitals and nursing note reviewed.  Constitutional:      Appearance: Normal appearance. She is not ill-appearing.  HENT:     Mouth/Throat:     Mouth: Mucous membranes are moist.     Pharynx: Oropharynx is clear. No oropharyngeal exudate or posterior oropharyngeal erythema.  Eyes:     Extraocular Movements: Extraocular movements intact.     Pupils: Pupils are equal, round, and reactive to light.  Neck:     Comments:  Healing incision to midline lower anterior neck  with surrounding erythema  Cardiovascular:     Rate and Rhythm: Normal rate and regular rhythm.     Pulses: Normal pulses.     Heart sounds: Normal heart sounds. No murmur heard. Pulmonary:     Effort: Pulmonary effort is normal. No respiratory distress.     Breath sounds: Normal breath sounds. No wheezing, rhonchi or rales.  Abdominal:     General: Bowel sounds are normal. There is no distension.     Palpations: Abdomen is soft. There is no mass.     Tenderness: There is abdominal tenderness (mild-mod) in the right lower quadrant, periumbilical area, suprapubic area, left upper quadrant and left lower quadrant. There is no guarding or rebound. Negative signs include  Murphy's sign.     Hernia: No hernia is present.  Skin:    General: Skin is warm and dry.  Neurological:     Mental Status: She is alert.  Psychiatric:        Mood and Affect: Mood normal.        Behavior: Behavior normal.       Results   RADIOLOGY CT abdomen pelvis: 9 mm left renal pelvic stone without hydronephrosis; 7 mm linear appendicolith without appendicitis changes (11/14/2023)  PATHOLOGY Left thyroid  partial lobectomy: Benign nodular follicular hyperplasia (11/10/2023)       Results for orders placed or performed in visit on 11/21/23  CBC with Differential/Platelet   Collection Time: 11/21/23  3:17 PM  Result Value Ref Range   WBC 9.6 4.0 - 10.5 K/uL   RBC 5.20 (H) 3.87 - 5.11 Mil/uL   Hemoglobin 15.0 12.0 -  15.0 g/dL   HCT 54.5 63.9 - 53.9 %   MCV 87.3 78.0 - 100.0 fl   MCHC 33.1 30.0 - 36.0 g/dL   RDW 86.0 88.4 - 84.4 %   Platelets 299.0 150.0 - 400.0 K/uL   Neutrophils Relative % 48.9 43.0 - 77.0 %   Lymphocytes Relative 40.2 12.0 - 46.0 %   Monocytes Relative 7.1 3.0 - 12.0 %   Eosinophils Relative 3.3 0.0 - 5.0 %   Basophils Relative 0.5 0.0 - 3.0 %   Neutro Abs 4.7 1.4 - 7.7 K/uL   Lymphs Abs 3.9 0.7 - 4.0 K/uL   Monocytes Absolute 0.7 0.1 - 1.0 K/uL   Eosinophils Absolute 0.3 0.0 - 0.7 K/uL   Basophils Absolute 0.0 0.0 - 0.1 K/uL  Comprehensive metabolic panel with GFR   Collection Time: 11/21/23  3:17 PM  Result Value Ref Range   Sodium 141 135 - 145 mEq/L   Potassium 4.3 3.5 - 5.1 mEq/L   Chloride 103 96 - 112 mEq/L   CO2 27 19 - 32 mEq/L   Glucose, Bld 91 70 - 99 mg/dL   BUN 12 6 - 23 mg/dL   Creatinine, Ser 9.19 0.40 - 1.20 mg/dL   Total Bilirubin 1.0 0.2 - 1.2 mg/dL   Alkaline Phosphatase 77 39 - 117 U/L   AST 14 0 - 37 U/L   ALT 14 0 - 35 U/L   Total Protein 7.3 6.0 - 8.3 g/dL   Albumin 4.2 3.5 - 5.2 g/dL   GFR 09.80 >39.99 mL/min   Calcium 8.8 8.4 - 10.5 mg/dL  POCT urinalysis dipstick   Collection Time: 11/21/23  4:00 PM  Result  Value Ref Range   Color, UA amber    Clarity, UA cloudy    Glucose, UA Negative Negative   Bilirubin, UA negative    Ketones, UA negative    Spec Grav, UA >=1.030 (A) 1.010 - 1.025   Blood, UA 3+    pH, UA 5.5 5.0 - 8.0   Protein, UA Positive (A) Negative   Urobilinogen, UA 0.2 0.2 or 1.0 E.U./dL   Nitrite, UA negative    Leukocytes, UA Negative Negative   Appearance     Odor     Micro: WBC 3-5 RBC TNTC Bact tr Casts none Epi 5-10 UCx not sent  Cal ox crystals present   Assessment & Plan:      Acute and recurrent abdominal pain Recurrent lower abdominal pain with nausea, vomiting, and chills. Differential includes appendicitis in history of appendicolith. - Order CBC and CMP. - Order urine test. - Order contrasted CT scan of the abdomen to rule out appendicitis. - Advise clear liquid diet for 1-2 days. - Avoid high fiber foods; if eating solids, stick to bland foods like rice, bananas, applesauce, and toast.  Appendicolith Previously identified 7 mm appendicolith with concern for potential appendicitis. - Order contrasted CT scan of the abdomen to rule out appendicitis.  Left renal pelvic stone and bilateral nephrolithiasis 9 mm left renal pelvic stone with distinct pain. Under urologist care for potential surgical intervention. - Follow up with urologist on Wednesday to discuss surgical options for stone removal.  Recurrent urinary tract infections History of recurrent UTIs with current symptoms. Currently on antibiotic course (Linezold with augmentin ) - Order urine test to compare with previous hospital results.  Gastroesophageal reflux disease (GERD) Persistent GERD symptoms with nocturnal heartburn despite Pepcid .  - Prescribe omeprazole 40 mg daily, 30 minutes before lunch. Take  for 3 weeks then as needed. - Continue Pepcid  at night.  Chronic cough, most consistent with post-infectious and/or reflux-related Chronic cough likely related to post-infectious  changes or GERD. Improved since thyroid  surgery but remains bothersome. - Try Airsupra inhaler once or twice daily for a few days to assess effect on cough. - Monitor effect after omeprazole course.   Left upper extremity superficial thrombophlebitis - during recent hospitalization.  Improving condition with swelling and pain managed with aspirin and warm compresses. - Continue low-dose aspirin 81 mg daily. - Apply warm compresses to the affected area.  Recent left partial thyroidectomy for thyroid  nodules, status post neck cellulitis Status post left partial thyroidectomy for benign thyroid  nodules. Postoperative neck cellulitis resolved.  Polycystic ovary syndrome, status post hysterectomy PCOS with hysterectomy performed in 2022.  General Health Maintenance - Schedule rheumatology appointment for lowly positive positive ANA. - Order B12 level before next B12 shot. - Update ANA test.  Recording duration: 25 minutes      Problem List Items Addressed This Visit     GERD (gastroesophageal reflux disease)   Intensify GERD treatment with omeprazole 40mg  daily x3 wks then PRN. This could contribute to chronic cough.       Relevant Medications   omeprazole (PRILOSEC) 40 MG capsule   Chronic cough   Ongoing since over summer.  Initially ill with tonsillitis/sinusitis, but that has resolved.  Discussed cough could be due to residual post-viral inflammation, vs GERD contribution.  Denies significant allergy symptoms.  Not consistent with asthma exacerbation.  Will treat with PPI and suggested she try her airsupra inhaler as well.       Vitamin B12 deficiency   Update vitamin B12 levels on b12 shots.  Has received 3 total, latest 10/13/2023.       Relevant Orders   Vitamin B12   Left thyroid  nodule   S/p L thyroidectomy ([facility]) with biopsy 09/2023 - benign follicular thyroid  tissue ([facility]hesda II)       Cellulitis of neck   Completing oral augmentin /linezolid course       Right lower quadrant abdominal pain - Primary   Relatively new, endorses severe lower abdominal pain. Physical exam today fortunately without obvious acute abdomen. Will update labs including CBC, CMP, and order STAT contrasted CT abd/pelvis to r/o appendicitis / diverticulitis or other acute intraabdominal pathology.       Relevant Orders   POCT urinalysis dipstick (Completed)   CBC with Differential/Platelet (Completed)   Comprehensive metabolic panel with GFR (Completed)   CT ABDOMEN PELVIS W CONTRAST (Completed)   Positive ANA (antinuclear antibody)   Low positive titers-  ?false positive. She is pending rheumatology evaluation - needs to call and schedule appt       Relevant Orders   ANA     Meds ordered this encounter  Medications   omeprazole (PRILOSEC) 40 MG capsule    Sig: Take 1 capsule (40 mg total) by mouth daily.    Dispense:  30 capsule    Refill:  3   gabapentin  (NEURONTIN ) 300 MG capsule    Sig: Take 1 capsule (300 mg total) by mouth at bedtime.    Dispense:  30 capsule    Refill:  3    Orders Placed This Encounter  Procedures   CT ABDOMEN PELVIS W CONTRAST    Standing Status:   Future    Number of Occurrences:   1    Expiration Date:   11/20/2024    If indicated for the  ordered procedure, I authorize the administration of contrast media per Radiology protocol:   Yes    Does the patient have a contrast media/X-ray dye allergy?:   No    Is patient pregnant?:   No    Preferred imaging location?:   [facility]    If indicated for the ordered procedure, I authorize the administration of oral contrast media per Radiology protocol:   Yes   CBC with Differential/Platelet   Comprehensive metabolic panel with GFR   Vitamin B12   ANA   POCT urinalysis dipstick    Patient Instructions  Labs today I will order STAT CT scan with contrast   VISIT SUMMARY:  During your visit, we discussed your abdominal pain, which began after your recent hospital  discharge. You described the pain as a stabbing sensation under your belly button, radiating to the right side, and distinct from your usual kidney stone pain. You also experienced nausea, vomiting, and chills. We reviewed your history of recurrent urinary tract infections, kidney stones, and recent partial thyroidectomy. We also addressed your GERD, chronic cough, and left upper extremity superficial thrombophlebitis.  YOUR PLAN:  -ACUTE AND RECURRENT ABDOMINAL PAIN: Your abdominal pain, accompanied by nausea, vomiting, and chills, may be due to various causes, including appendicitis. We will perform blood tests (CBC and CMP), a urine test, and a contrasted CT scan of your abdomen to rule out appendicitis. In the meantime, follow a clear liquid diet for 1-2 days and avoid high fiber foods. If you eat solids, stick to bland foods like rice, bananas, applesauce, and toast.  -APPENDICOLITH: An appendicolith is a calcified deposit within the appendix that can lead to appendicitis. We will perform a contrasted CT scan of your abdomen to rule out appendicitis.  -LEFT RENAL PELVIC STONE AND BILATERAL NEPHROLITHIASIS: You have a 9 mm stone in your left kidney, which is causing distinct pain. You are under the care of a urologist to discuss potential surgical options for stone removal. Please follow up with your urologist on Wednesday.  -RECURRENT URINARY TRACT INFECTIONS: You have a history of recurrent urinary tract infections (UTIs) and are currently experiencing symptoms. We will perform a urine test to compare with previous hospital results.  -GASTROESOPHAGEAL REFLUX DISEASE (GERD): GERD is a condition where stomach acid frequently flows back into the esophagus, causing heartburn. Since you have persistent symptoms, we are prescribing omeprazole 40 mg daily, to be taken 30 minutes before lunch. Continue taking Pepcid  at night.  -CHRONIC COUGH, LIKELY POST-INFECTIOUS AND/OR REFLUX-RELATED: Your chronic  cough is likely related to post-infectious changes or GERD. Although it has improved since your thyroid  surgery, it remains bothersome. Try using the Airsupra inhaler once or twice daily for a few days to see if it helps with your cough.  -LEFT UPPER EXTREMITY SUPERFICIAL THROMBOPHLEBITIS: Superficial thrombophlebitis is inflammation of a vein just under the skin, often with a blood clot. Your condition is improving with low-dose aspirin and warm compresses. Continue taking low-dose aspirin 81 mg daily and apply warm compresses to the affected area.  -RECENT LEFT PARTIAL THYROIDECTOMY FOR THYROID  NODULES, STATUS POST NECK CELLULITIS: You recently had a partial thyroidectomy for benign thyroid  nodules, with postoperative neck cellulitis.  -POLYCYSTIC OVARY SYNDROME, STATUS POST HYSTERECTOMY: You have a history of polycystic ovary syndrome (PCOS) and had a hysterectomy in 2022.  -GENERAL HEALTH MAINTENANCE: We will schedule a rheumatology appointment, order a B12 level test before your next B12 shot, and order an ANA test.  INSTRUCTIONS:  Please  follow up with your urologist on Wednesday to discuss surgical options for your kidney stone removal. Additionally, we will perform blood tests (CBC and CMP), a urine test, and a contrasted CT scan of your abdomen to rule out appendicitis. Continue with the prescribed medications and dietary recommendations. Schedule a rheumatology appointment, and we will order a B12 level test before your next B12 shot and an ANA test.  Follow up plan: Return if symptoms worsen or fail to improve.  Anton Blas, MD

## 2023-11-22 ENCOUNTER — Telehealth: Payer: Self-pay | Admitting: Family Medicine

## 2023-11-22 ENCOUNTER — Ambulatory Visit: Payer: Self-pay | Admitting: Family Medicine

## 2023-11-22 DIAGNOSIS — R1031 Right lower quadrant pain: Secondary | ICD-10-CM | POA: Insufficient documentation

## 2023-11-22 DIAGNOSIS — R7689 Other specified abnormal immunological findings in serum: Secondary | ICD-10-CM | POA: Insufficient documentation

## 2023-11-22 LAB — VITAMIN B12: Vitamin B-12: 264 pg/mL (ref 211–911)

## 2023-11-22 NOTE — Assessment & Plan Note (Signed)
 Ongoing since over summer.  Initially ill with tonsillitis/sinusitis, but that has resolved.  Discussed cough could be due to residual post-viral inflammation, vs GERD contribution.  Denies significant allergy symptoms.  Not consistent with asthma exacerbation.  Will treat with PPI and suggested she try her airsupra inhaler as well.

## 2023-11-22 NOTE — Telephone Encounter (Signed)
 Copied from CRM [id number]. Topic: Referral - Status >> Nov 22, 2023  1:56 PM [name] PARAS wrote: Reason for CRM: pt called in about referral to rheumotology that was discussed a month ago , she would like to see [name]  with [facility] fax # [phone]

## 2023-11-22 NOTE — Assessment & Plan Note (Signed)
 Completing oral augmentin /linezolid course

## 2023-11-22 NOTE — Telephone Encounter (Signed)
 Appears Dr KANDICE responded via my chart. Sending to G pool.

## 2023-11-22 NOTE — Assessment & Plan Note (Signed)
 Low positive titers-  ?false positive. She is pending rheumatology evaluation - needs to call and schedule appt

## 2023-11-22 NOTE — Assessment & Plan Note (Signed)
 S/p L thyroidectomy ([facility]) with biopsy 09/2023 - benign follicular thyroid  tissue (Bethesda II)

## 2023-11-22 NOTE — Telephone Encounter (Signed)
 SABRA

## 2023-11-22 NOTE — Assessment & Plan Note (Signed)
 Intensify GERD treatment with omeprazole 40mg  daily x3 wks then PRN. This could contribute to chronic cough.

## 2023-11-22 NOTE — Assessment & Plan Note (Signed)
 Relatively new, endorses severe lower abdominal pain. Physical exam today fortunately without obvious acute abdomen. Will update labs including CBC, CMP, and order STAT contrasted CT abd/pelvis to r/o appendicitis / diverticulitis or other acute intraabdominal pathology.

## 2023-11-22 NOTE — Assessment & Plan Note (Addendum)
 Update vitamin B12 levels on b12 shots.  Has received 3 total, latest 10/13/2023.

## 2023-11-23 ENCOUNTER — Other Ambulatory Visit: Payer: Self-pay

## 2023-11-23 ENCOUNTER — Inpatient Hospital Stay: Admitting: Infectious Diseases

## 2023-11-23 ENCOUNTER — Ambulatory Visit

## 2023-11-23 ENCOUNTER — Ambulatory Visit (INDEPENDENT_AMBULATORY_CARE_PROVIDER_SITE_OTHER): Admitting: Urology

## 2023-11-23 ENCOUNTER — Encounter: Payer: Self-pay | Admitting: *Deleted

## 2023-11-23 VITALS — BP 141/92 | HR 115 | Ht 61.0 in | Wt 186.0 lb

## 2023-11-23 DIAGNOSIS — N390 Urinary tract infection, site not specified: Secondary | ICD-10-CM | POA: Diagnosis not present

## 2023-11-23 DIAGNOSIS — N200 Calculus of kidney: Secondary | ICD-10-CM | POA: Diagnosis not present

## 2023-11-23 DIAGNOSIS — R102 Pelvic and perineal pain unspecified side: Secondary | ICD-10-CM | POA: Diagnosis not present

## 2023-11-23 DIAGNOSIS — E538 Deficiency of other specified B group vitamins: Secondary | ICD-10-CM

## 2023-11-23 LAB — ANA: Anti Nuclear Antibody (ANA): POSITIVE — AB

## 2023-11-23 LAB — ANTI-NUCLEAR AB-TITER (ANA TITER): ANA Titer 1: 1:80 {titer} — ABNORMAL HIGH

## 2023-11-23 MED ORDER — CYANOCOBALAMIN 1000 MCG/ML IJ SOLN
1000.0000 ug | Freq: Once | INTRAMUSCULAR | Status: AC
  Administered 2023-11-23: 1000 ug via INTRAMUSCULAR

## 2023-11-23 MED ORDER — TRIMETHOPRIM 100 MG PO TABS: 100.0000 mg | ORAL_TABLET | Freq: Every day | ORAL | 0 refills | Status: AC

## 2023-11-23 NOTE — Progress Notes (Signed)
   11/23/2023 3:59 PM   Damien GORMAN Custard [birth year] [id number]  Reason for visit: Follow up recurrent nephrolithiasis, recurrent UTI/bladder pain  History: Recurrent uric acid stones, multiple left sided ureteroscopy/laser lithotripsy, most recently February 2025, does not tolerate ureteral stents.  Has had problems with severe postop pain multiple times.  Metabolic workup has been recommended previously but 24-hour urine has not been completed.  She has not tolerated potassium citrate   Physical Exam: BP (!) 141/92 (BP Location: Left Arm, Patient Position: Sitting, Cuff Size: Normal)   Pulse (!) 115   Ht 5' 1 (1.549 m)   Wt 186 lb (84.4 kg)   LMP 09/29/2020 (Approximate) Comment: PCOS  SpO2 97%   BMI 35.14 kg/m   Imaging/labs: I personally viewed and interpreted the recent CT scans from 11/14/2023 and 11/21/2023 showing no hydronephrosis and a 9 mm nonobstructive left renal stone, density approximately 400HU consistent with uric acid Dipstick urinalysis 10/27 benign, urine pH 5.5  Today: Recently hospitalized in [place] for infection after thyroidectomy, urine culture was negative, she reported abdominal pain and a CT was performed showing a nonobstructive 9 mm left uric acid stone.  A follow-up CT with PCP showed stable nonobstructive left-sided stone.   Reports ongoing abdominal pain and back pain She feels she could have interstitial cystitis, intermittent dysuria and pelvic pain, recent culture was negative  Plan:   Uric acid stones: I reviewed her CT results that show a non-obstructive left-sided renal stone.  With her history of numerous surgeries and not tolerating ureteral stents and postop pain, I recommended dissolution therapy with half teaspoon baking soda twice daily, with close follow-up.  Return precautions were discussed.  Would also again recommend a 24-hour urine analysis in the future Urinary symptoms: Recent UA benign, recommended 90 days of low-dose trimethoprim   to prevent infections, consider more of an IC/chronic bladder pain syndrome picture if persistent symptoms despite benign urinalysis and cultures RTC 2 to 3 weeks check UA for pH monitoring on alkalinization  I spent 30 total minutes on the day of the encounter including pre-visit review of the medical record, face-to-face time with the patient, and post visit ordering of labs/imaging/tests.  Extensive review of records from recent hospitalization in [place] and multiple CT scans/lab work with PCP and during hospitalization.   Redell JAYSON Burnet, MD  [facility] [address], [address] [place], [place] [address] [phone]

## 2023-11-23 NOTE — Telephone Encounter (Signed)
 Lvm for patient of referral details and number to call and follow up.

## 2023-11-23 NOTE — Patient Instructions (Signed)
 Mix half teaspoon baking soda with 8 ounces of water twice daily to dissolve your uric acid kidney stone  Eating Plan for Interstitial Cystitis Interstitial cystitis (IC) is a long-term (chronic) condition that can cause pain and pressure near your bladder. It can also make you have to pee urgently and often. Symptoms may come and go. Certain foods may trigger your symptoms. Learning what foods bother you can help you come up with an eating plan to manage IC. What are tips for following this plan? You may want to work with an expert in healthy eating called a dietitian. They can help you make an eating plan by doing an elimination diet. This diet involves: Making a list of foods that you think trigger your symptoms. You may also want to include foods that often cause symptoms in other people with IC. It may take a few months to find out which foods bother you. Taking those foods out of your diet for about a month. After that month, you can try to have the foods again one at a time to see which ones cause symptoms. Reading food labels Once you know which foods trigger your symptoms, you can avoid them. But it's still a good idea to read food labels. Some foods that cause your symptoms may be ingredients in other foods. These foods may include: Soy. Worcestershire sauce. Vinegar. Alcohol. Artificial sweeteners. Monosodium glutamate. Other triggers may include: Chili peppers. Tomato products. Citrus fruits, flavors, or juices. Shopping Shopping can be hard if many foods trigger your IC. Bring a list of the foods you can't eat with you when you go to the store. You can get an app for your phone that lets you know which foods are safer and which ones you may want to avoid. You can find the app at the [employer] website: ic-network.com Meal planning Plan your meals based on the results of your elimination diet. If you haven't done the diet yet, plan meals based on IC food lists. These lists may be  given to you by your health care provider or dietitian. The lists tell you which foods are least and most likely to cause symptoms. Avoid certain types of food when you go out to eat. These may include: Pizza. Indian food. Mexican food. Thai food. General information Do not eat large portions. Drink lots of fluids with your meals. Do not eat foods that are high in sugar, salt, or saturated fat. Choose whole fruits instead of juice. Eat a colorful variety of vegetables. Find ways to manage stress. Get enough exercise. What foods should I eat? For people with IC, the best diet is a balanced one. This means it includes things from all the food groups. Even if you have to avoid certain foods, there are still lots of healthy choices in each group. Below are some foods that may be safest for you to eat: Fruits Bananas. Blueberries and blueberry juice. Melons. Pears. Apples. Dates. Prunes. Raisins. Apricots. Vegetables Asparagus. Avocado. Celery. Beets. Bell peppers. Black olives. Broccoli. Brussels sprouts. Cabbage. Carrots. Cauliflower. Cucumber. Eggplant. Green beans. Potatoes. Radishes. Spinach. Squash. Turnips. Zucchini. Mushrooms. Peas. Grains Oats. Rice. Bran. Oatmeal. Whole wheat bread. Meats and other proteins Beef. Fish and other seafood. Eggs. Nuts. Peanut butter. Pork. Poultry. Lamb. Garbanzo beans. Pinto beans. Dairy Whole or low-fat milk. American, mozzarella, mild cheddar, feta, ricotta, and cream cheeses. The items listed above may not be all the foods and drinks you can have. Talk to a dietitian to learn more.  What foods should I avoid? You should avoid any foods that cause symptoms. It's also a good idea to avoid foods that often cause symptoms in many people with IC. These foods include: Fruits Citrus fruits, such as lemons, limes, oranges, and grapefruit. Cranberries. Strawberries. Pineapple. Kiwi. Vegetables Chili peppers. Onions. Sauerkraut. Tomato and tomato products.  Dene. Grains You don't need to avoid any type of grain unless it causes symptoms. Meats and other proteins Precooked or cured meats, such as sausages or meat loaves. Soy products. Dairy Chocolate ice cream. Processed cheese. Yogurt. Drinks Alcohol. Chocolate drinks. Coffee. Cranberry juice. Fizzy drinks. Black, green, or herbal tea. Tomato juice. Sports drinks. The items listed above may not be all the foods and drinks you should avoid. Talk to a dietitian to learn more. This information is not intended to replace advice given to you by your health care provider. Make sure you discuss any questions you have with your health care provider. Document Revised: 04/22/2022 Document Reviewed: 04/22/2022 Elsevier Patient Education  2024 [employer].

## 2023-11-23 NOTE — Progress Notes (Unsigned)
 Per orders of Dr. Greig Ring, injection of B-12 given by Delores, Kashlyn Salinas D in left deltoid. Patient tolerated injection well. Patient will make appointment for 1 month.

## 2023-11-24 ENCOUNTER — Other Ambulatory Visit: Payer: Self-pay | Admitting: Urology

## 2023-11-25 NOTE — Progress Notes (Signed)
 Attempted to obtain medical history for pre op call via telephone, unable to reach at this time. HIPAA compliant voicemail message left requesting return call to pre surgical testing department.

## 2023-11-28 ENCOUNTER — Ambulatory Visit (HOSPITAL_COMMUNITY)

## 2023-11-28 ENCOUNTER — Encounter (HOSPITAL_COMMUNITY)
Admission: RE | Disposition: A | Discharge status: Home / Self Care | Payer: Self-pay | Source: Home / Self Care | Attending: Urology

## 2023-11-28 ENCOUNTER — Ambulatory Visit (HOSPITAL_COMMUNITY)
Admission: RE | Admit: 2023-11-28 | Discharge: 2023-11-28 | Disposition: A | Discharge status: Home / Self Care | Attending: Urology | Admitting: Urology

## 2023-11-28 ENCOUNTER — Other Ambulatory Visit: Payer: Self-pay

## 2023-11-28 ENCOUNTER — Encounter (HOSPITAL_COMMUNITY): Payer: Self-pay | Admitting: Urology

## 2023-11-28 DIAGNOSIS — D649 Anemia, unspecified: Secondary | ICD-10-CM | POA: Insufficient documentation

## 2023-11-28 DIAGNOSIS — G4730 Sleep apnea, unspecified: Secondary | ICD-10-CM | POA: Diagnosis not present

## 2023-11-28 DIAGNOSIS — Z6834 Body mass index (BMI) 34.0-34.9, adult: Secondary | ICD-10-CM | POA: Diagnosis not present

## 2023-11-28 DIAGNOSIS — E669 Obesity, unspecified: Secondary | ICD-10-CM | POA: Insufficient documentation

## 2023-11-28 DIAGNOSIS — N200 Calculus of kidney: Secondary | ICD-10-CM | POA: Diagnosis present

## 2023-11-28 HISTORY — PX: EXTRACORPOREAL SHOCK WAVE LITHOTRIPSY: SHX1557

## 2023-11-28 SURGERY — LITHOTRIPSY, ESWL
Anesthesia: LOCAL | Laterality: Left

## 2023-11-28 MED ORDER — DIAZEPAM 5 MG PO TABS
10.0000 mg | ORAL_TABLET | ORAL | Status: AC
  Administered 2023-11-28: 10 mg via ORAL
  Filled 2023-11-28: qty 2

## 2023-11-28 MED ORDER — BISACODYL 5 MG PO TBEC: 5.0000 mg | DELAYED_RELEASE_TABLET | Freq: Every day | ORAL | Status: DC | PRN

## 2023-11-28 MED ORDER — SODIUM CHLORIDE 0.9 % IV SOLN: INTRAVENOUS | Status: DC

## 2023-11-28 MED ORDER — TRAMADOL HCL 50 MG PO TABS: 50.0000 mg | ORAL_TABLET | Freq: Four times a day (QID) | ORAL | 0 refills | Status: AC | PRN

## 2023-11-28 MED ORDER — DIPHENHYDRAMINE HCL 25 MG PO CAPS
25.0000 mg | ORAL_CAPSULE | ORAL | Status: AC
  Administered 2023-11-28: 25 mg via ORAL
  Filled 2023-11-28: qty 1

## 2023-11-28 MED ORDER — CIPROFLOXACIN HCL 500 MG PO TABS
500.0000 mg | ORAL_TABLET | ORAL | Status: AC
  Administered 2023-11-28: 500 mg via ORAL
  Filled 2023-11-28: qty 1

## 2023-11-28 NOTE — Discharge Instructions (Signed)
 1. You should strain your urine and collect all fragments and bring them to your follow up appointment.  2. You should take your pain medication as needed.  Please call if your pain is severe to the point that it is not controlled with your pain medication. 3. You should call if you develop fever > 101 or persistent nausea or vomiting. 4. Your doctor may prescribe tamsulosin  to take to help facilitate stone passage.

## 2023-11-28 NOTE — Op Note (Signed)
 See [employer] operative note scanned into chart. Also because of the size, density, location and other factors that cannot be anticipated I feel this will likely be a staged procedure. This fact supersedes any indication in the scanned [place] stone operative note to the contrary.  Herlene Foot MD 11/28/2023, 11:09 AM  [facility]  Pager: [phone]

## 2023-11-28 NOTE — H&P (Signed)
 H&P  History of Present Illness: Kayla Sexton is a 43 y.o. year old F who presents today for treatment of a left renal stone  No acute complaints  Past Medical History:  Diagnosis Date   Anemia    Anxiety    Arthritis    Asthma    Cancer (HCC) 2006   CERVICAL , stage 1 ovarian cancer   CIN III (cervical intraepithelial neoplasia III)    Dysuria    Family history of adverse reaction to anesthesia    DAD AND BROTHER AND MOM-N/V   GERD (gastroesophageal reflux disease)    OCC   Hematuria    HH (hiatus hernia)    History of cervical dysplasia 2006   CIN1 and CIN2   History of kidney stones    Migraines    PCOS (polycystic ovarian syndrome)    Dr. Mat   PONV (postoperative nausea and vomiting)    HARD TO WAKE UP   Right ureteral calculus    Sleep apnea    DOES NOT USE CPAP   Urinary incontinence without sensory awareness     Past Surgical History:  Procedure Laterality Date   ABDOMINAL HYSTERECTOMY     total / laporscopic   CERVICAL BIOPSY  W/ LOOP ELECTRODE EXCISION  12-04-2004   dr mat  [facility]   COLONOSCOPY  03/2008   Dr. Dianna   COMBINED HYSTEROSCOPY DIAGNOSTIC / D&C  02-28-2012   dr mat   @ [facility]   CYSTOSCOPY W/ RETROGRADES Right 03/31/2018   Procedure: CYSTOSCOPY WITH RETROGRADE PYELOGRAM;  Surgeon: Francisca Redell BROCKS, MD;  Location: [facility];  Service: Urology;  Laterality: Right;   CYSTOSCOPY W/ RETROGRADES  11/11/2020   Procedure: CYSTOSCOPY WITH RETROGRADE PYELOGRAM;  Surgeon: Francisca Redell BROCKS, MD;  Location: [facility];  Service: Urology;;   CYSTOSCOPY WITH RETROGRADE PYELOGRAM, URETEROSCOPY AND STENT PLACEMENT Bilateral 07/21/2017   Procedure: CYSTOSCOPY WITH BILATERAL  RETROGRADE BILATERAL URETEROSCOPY AND STENTS  PLACEMENT;  Surgeon: Watt Rush, MD;  Location: [facility];  Service: Urology;  Laterality: Bilateral;   CYSTOSCOPY WITH RETROGRADE URETHROGRAM Left 01/04/2020   Procedure: CYSTOSCOPY WITH RETROGRADE URETHROGRAM;  Surgeon:  Francisca Redell BROCKS, MD;  Location: [facility];  Service: Urology;  Laterality: Left;   CYSTOSCOPY/URETEROSCOPY/HOLMIUM LASER/STENT PLACEMENT Left 03/17/2018   Procedure: CYSTOSCOPY/URETEROSCOPY/HOLMIUM LASER/STENT PLACEMENT;  Surgeon: Francisca Redell BROCKS, MD;  Location: [facility];  Service: Urology;  Laterality: Left;   CYSTOSCOPY/URETEROSCOPY/HOLMIUM LASER/STENT PLACEMENT Right 03/31/2018   Procedure: CYSTOSCOPY/URETEROSCOPY/HOLMIUM LASER/STENT PLACEMENT;  Surgeon: Francisca Redell BROCKS, MD;  Location: [facility];  Service: Urology;  Laterality: Right;   CYSTOSCOPY/URETEROSCOPY/HOLMIUM LASER/STENT PLACEMENT Left 01/04/2020   Procedure: CYSTOSCOPY/URETEROSCOPY/HOLMIUM LASER;  Surgeon: Francisca Redell BROCKS, MD;  Location: [facility];  Service: Urology;  Laterality: Left;   CYSTOSCOPY/URETEROSCOPY/HOLMIUM LASER/STENT PLACEMENT Left 11/11/2020   Procedure: CYSTOSCOPY/URETEROSCOPY/HOLMIUM LASER;  Surgeon: Francisca Redell BROCKS, MD;  Location: [facility];  Service: Urology;  Laterality: Left;   CYSTOSCOPY/URETEROSCOPY/HOLMIUM LASER/STENT PLACEMENT Left 07/02/2022   Procedure: CYSTOSCOPY/URETEROSCOPY/HOLMIUM LASER FOR STONE;  Surgeon: Francisca Redell BROCKS, MD;  Location: [facility];  Service: Urology;  Laterality: Left;   CYSTOSCOPY/URETEROSCOPY/HOLMIUM LASER/STENT PLACEMENT Left 03/25/2023   Procedure: CYSTOSCOPY/URETEROSCOPY/HOLMIUM LASER;  Surgeon: Francisca Redell BROCKS, MD;  Location: [facility];  Service: Urology;  Laterality: Left;   DILATION AND CURETTAGE OF UTERUS     endoscopy  2010   Dr. 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 Jeremy, Krystal, MD)    Home Medications:  Current Meds  Medication Sig   famotidine  (PEPCID ) 20 MG tablet TAKE 1 TABLET BY MOUTH TWICE A DAY   gabapentin  (NEURONTIN ) 300 MG capsule Take 1 capsule (300 mg total) by mouth at bedtime.   omeprazole (PRILOSEC) 40 MG capsule Take 1 capsule (40 mg total) by mouth daily.    Allergies:  Allergies  Allergen Reactions   Adhesive [Tape] Swelling, Dermatitis and Other (See Comments)    Per the patient, only the 12 lead ekg pads cause welts, blisters and abrasions -ALSO, SOME BAND-AIDS, as well- OK TO USE PAPER TAPE   Alpha-Gal Anaphylaxis, Hives and Swelling   Other Anaphylaxis, Nausea And Vomiting and Other (See Comments)    RED MEAT-ANAPHYLAXIS  General Anesthesia - nausea and vomiting (scopolamine  patch doesn't work)   Vancomycin  Dermatitis, Hives, Itching, Anaphylaxis and Other (See Comments)    Other Reaction(s): rash;welts   Bactrim  [Sulfamethoxazole -Trimethoprim ] Rash   Metformin And Related Other (See Comments)    GI upset    Silicone Dermatitis and Other (See Comments)    redness    Family History  Problem Relation Age of Onset   Healthy Mother    Healthy Father    Breast cancer Paternal Grandmother    Breast cancer Other        Maternal Great Aunt    Social History:  reports that she has never smoked. She has never been exposed to tobacco smoke. She has never used smokeless tobacco. She reports that she does not drink alcohol and does not use drugs.  ROS: A complete review of systems was performed.  All systems are negative except for pertinent findings as noted.  Physical Exam:  Vital signs in last 24 hours: Temp:  [98.3 F (36.8 C)] 98.3 F (36.8 C) (11/03 0856) Pulse Rate:  [92] 92 (11/03 0856) Resp:  [16] 16 (11/03 0856) BP: (134)/(89) 134/89 (11/03 0856) SpO2:  [96 %] 96 % (11/03 0856) Weight:  [83.5 kg]  83.5 kg (11/03 0856) Constitutional:  Alert and oriented, No acute distress Cardiovascular: Regular rate and rhythm Respiratory: Normal respiratory effort, Lungs clear bilaterally GI: Abdomen is soft, nontender, nondistended, no abdominal masses Lymphatic: No lymphadenopathy Neurologic: Grossly intact, no focal deficits Psychiatric: Normal mood and affect   Laboratory Data:  No results for input(s): WBC, HGB, HCT, PLT in the last 72 hours.  No results for input(s): NA, K, CL, GLUCOSE, BUN, CALCIUM, CREATININE in the last 72 hours.  Invalid input(s): CO3   No results found for this or any previous visit (from the past 24 hours). No results found for this or any previous visit (from the past 240 hours).  Renal Function: Recent Labs    11/21/23 1517  CREATININE 0.80   Estimated Creatinine Clearance: 88.9 mL/min (by C-G formula based on SCr of 0.8 mg/dL).  Radiologic Imaging: No results found.  Assessment:  Kayla Sexton is a 43 y.o. year old F with left renal stone   Plan:  --to OR as planned for L ESWL. Procedure and risks reviewed,  including but not limited to hematuria, infection, sepsis, damage to GU tract, failure to complete procedure, retained stone fragments, need for future procedures, steinstrasse, pain, failure to pass fragments  Herlene Foot, MD 11/28/2023, 9:23 AM  [facility] Pager: [phone]

## 2023-11-29 ENCOUNTER — Encounter: Payer: Self-pay | Admitting: Family Medicine

## 2023-11-29 ENCOUNTER — Encounter (HOSPITAL_COMMUNITY): Payer: Self-pay | Admitting: Urology

## 2023-11-29 ENCOUNTER — Telehealth: Payer: Self-pay

## 2023-11-29 NOTE — Telephone Encounter (Signed)
 Forms filled out and returned to [place].

## 2023-11-29 NOTE — Telephone Encounter (Signed)
 Received completed forms from provider  Faxed to [facility] at [phone]  Copy sent to scan  MyChart message sent to patient that forms have been faxed and copy is available to pick up at the front desk.

## 2023-12-02 ENCOUNTER — Inpatient Hospital Stay: Admitting: Family Medicine

## 2023-12-07 ENCOUNTER — Ambulatory Visit: Admitting: Physician Assistant

## 2023-12-07 NOTE — Progress Notes (Signed)
   PROVIDER:  KRYSTAL OZELL SPINNER, MD  MRN: [id number] DOB: 10/03/1980 DATE OF ENCOUNTER: 12/07/2023 Interval History:   Patient returns for her first postoperative visit having undergone left thyroid  lobectomy on November 10, 2023.  Final pathology showed a benign follicular nodule with 1 benign lymph node.  There was no evidence of malignancy.  Postoperative course has been complicated by some wound issues.  She has had slight separation of the wound edges.  This may be related to an allergy to the suture material.  She is applying topical cocoa butter to her incision now.  Physical Examination:   Incision is healing.  There is no sign of infection.  There are areas along the incision where the skin edges have slightly separated and there appears to be superficial granulation tissue.  There is no visible suture material.  There is no sign of seroma or hematoma.  Voice quality is normal.   Assessment and Plan:    Diagnoses and all orders for this visit:  History of lobectomy of thyroid     Patient is recovering from left thyroid  lobectomy.  Fortunately the final pathology results are benign.  Patient will continue to apply topical creams to her incision as instructed below.  We will plan to check her TSH level next week to see if she will require thyroid  hormone supplementation.  Patient will return for a final wound check in 6 weeks.  CARE OF SURGICAL INCISION   If there is still some Dermabond on your incision, use Vaseline to remove completely.  Apply Palmer's Cocoa Butter with Vitamin E cream to your incision 2 or 3 times daily.  Massage cream into incision for one minute with each application using small circular motions.  Use sunscreen (50 SPF or higher) for the first 6 months after surgery if area is exposed to sun.  You may alternate Mederma or other scar reducing cream with the cocoa butter cream if desired.   [name], MD [facility] Office:  [phone]

## 2024-01-11 ENCOUNTER — Ambulatory Visit: Payer: Self-pay

## 2024-01-11 NOTE — Telephone Encounter (Signed)
 Noted! Thank you

## 2024-01-11 NOTE — Telephone Encounter (Signed)
 CAL Is aware, will close out this CRM. No further need for triage to contact patient at this time.

## 2024-01-11 NOTE — Telephone Encounter (Signed)
 Called patient appointment set up is the first day that she is available to be seen. She would like to talk to pcp about thyroid  and attention issues.   She is not currently having any suicidal thoughts. Had them yesterday. She was able to remove herself from the situation and talk with her therapist. And is doing better now. She is in agreement that she will call 911 if any thoughts of self harm. She does have crisis numbers to call and is able to contact her counselor at any time. She just plans on being away from situation and working until appointment. She will let us  know if any further questions or if anything we can do.

## 2024-01-11 NOTE — Telephone Encounter (Addendum)
 FYI Only or Action Required?: FYI only for provider: appointment scheduled on 01/20/2024.  Patient was last seen in primary care on 11/21/2023 by Rilla Baller, MD.  Called Nurse Triage reporting Suicidal.  Symptoms began several months ago.  Interventions attempted: Nothing.  Symptoms are: gradually worsening.  Triage Disposition: Home Care  Patient/caregiver understands and will follow disposition?: Yes   Reason for Disposition  Referral phone numbers for crisis intervention and depression, questions about  Answer Assessment - Initial Assessment Questions Says Dr Sherren did thyroid  surgery 10/15. does not want to put her on thyroid  medication. Also has ADD. Patient says family is driving her crazy and she threatened suicide to them so they would leave alone.  1. MAIN CONCERN: What happened that made you call today?     Depressed, losing hair, relationship problems, 2. RISK OF HARM - SUICIDAL IDEATION:  Do you ever have thoughts of hurting or killing yourself?  (e.g., yes, no, no but preoccupation with thoughts about death)     Yes, but says she  3. RISK OF HARM - SUICIDE ATTEMPT: Have you tried to harm yourself recently? If Yes, ask: When was this?  What type of harm was tried?     Denies 5. EVENTS AND STRESSORS: Has there been any new stress or recent changes in your life? (e.g., death of loved one, homelessness, negative event, relationship breakup, work)     Mother is stressful 6. FUNCTIONAL IMPAIRMENT: How have things been going for you overall? Have you had more difficulty than usual doing your normal daily activities?  (e.g., better, same, worse; self-care, school, work, interactions)     Denies 7. SUPPORT: Who is with you now? Who do you live with? Do you have family or friends who you can talk to?      Parents, who she has some issues with.  8. THERAPIST: Do you have a counselor or therapist? If Yes, ask: What is their name?     Has a therapist  and councelor. Says this has been an ongoing issue for many years. Says she can always call her therapist if needed. 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 9. ALCOHOL USE OR SUBSTANCE USE (DRUG USE): Do you drink alcohol or use any illegal drugs (or prescription drugs in ways other than prescribed)?     Denies 10. OTHER: Do you have any other physical symptoms right now? (e.g., fever)       Decreased appetite, hair loss. 11. PREGNANCY or POSTPARTUM: Is there any chance you are pregnant? When was your last menstrual period? Were you recently pregnant? When did you give birth?       Denies. Recently had hysterectomy.  Protocols used: Suicide Concerns-A-AH

## 2024-01-11 NOTE — Telephone Encounter (Signed)
 First attempt to reach mother at 01:28. LM to return call to [phone] for triage.

## 2024-01-20 ENCOUNTER — Encounter: Payer: Self-pay | Admitting: Family Medicine

## 2024-01-20 ENCOUNTER — Ambulatory Visit (INDEPENDENT_AMBULATORY_CARE_PROVIDER_SITE_OTHER): Admitting: Family Medicine

## 2024-01-20 VITALS — BP 110/86 | HR 96 | Temp 97.8°F | Ht 61.0 in | Wt 187.4 lb

## 2024-01-20 DIAGNOSIS — F988 Other specified behavioral and emotional disorders with onset usually occurring in childhood and adolescence: Secondary | ICD-10-CM

## 2024-01-20 DIAGNOSIS — F39 Unspecified mood [affective] disorder: Secondary | ICD-10-CM | POA: Insufficient documentation

## 2024-01-20 DIAGNOSIS — E538 Deficiency of other specified B group vitamins: Secondary | ICD-10-CM

## 2024-01-20 DIAGNOSIS — Z9009 Acquired absence of other part of head and neck: Secondary | ICD-10-CM | POA: Diagnosis not present

## 2024-01-20 DIAGNOSIS — E559 Vitamin D deficiency, unspecified: Secondary | ICD-10-CM | POA: Diagnosis not present

## 2024-01-20 LAB — VITAMIN B12: Vitamin B-12: 257 pg/mL (ref 211–911)

## 2024-01-20 LAB — TSH: TSH: 1.75 u[IU]/mL (ref 0.35–5.50)

## 2024-01-20 LAB — VITAMIN D 25 HYDROXY (VIT D DEFICIENCY, FRACTURES): VITD: 12.43 ng/mL — ABNORMAL LOW (ref 30.00–100.00)

## 2024-01-20 MED ORDER — CYANOCOBALAMIN 1000 MCG/ML IJ SOLN
1000.0000 ug | Freq: Once | INTRAMUSCULAR | Status: AC
  Administered 2024-01-20: 1000 ug via INTRAMUSCULAR

## 2024-01-20 NOTE — Assessment & Plan Note (Signed)
Update levels off replacement. 

## 2024-01-20 NOTE — Assessment & Plan Note (Signed)
 Update vit B12 levels then repeat b12 shot. Last B12 shot was late 10/2023.

## 2024-01-20 NOTE — Patient Instructions (Addendum)
 Labs today then B12 shot.  Bring me records of ADD testing, and we will requests records from Dr Lauraine Bolder in [place].  Continue seeing counselor, let me know if you need new referral.

## 2024-01-20 NOTE — Assessment & Plan Note (Signed)
 Carries this diagnosis as a child.  She underwent formal ADHD testing in 2022 through Dr Lauraine Bolder in [place] but I don't have records available - asked her to bring this in, and I will request copy of report to review. Consider Wellbutrin as per above.

## 2024-01-20 NOTE — Assessment & Plan Note (Signed)
 Notes worsened mood since she underwent left lobe thyroidectomy.  Update TSH today.

## 2024-01-20 NOTE — Progress Notes (Signed)
 " Ph: [phone] Fax: [phone]   Patient ID: Kayla Sexton, female    DOB: Jan 27, 1980, 43 y.o.   MRN: [id number]  This visit was conducted in person.  BP 110/86 (Cuff Size: Normal)   Pulse 96   Temp 97.8 F (36.6 C) (Oral)   Ht 5' 1 (1.549 m)   Wt 187 lb 6.4 oz (85 kg)   LMP 09/29/2020 Comment: PCOS  SpO2 98%   BMI 35.41 kg/m    CC: discuss mood Subjective:   HPI: Kayla Sexton is a 43 y.o. female presenting on 01/20/2024 for Acute Visit (Pt had thyroid  removed and has noticed feelings of anxiety and depression worsening, pt has had thoughts of hurting herself, no plan//Pt's mom, Dawn, is in room and made pt come today, Pt not is not happy to be here)   S/p L thyroid  lobectomy with pathology showing benign follicular nodule 11/10/2023 ([facility]). She had to cancel recent gen surg f/u.  S/p lithotripsy, ESWL 11/28/2203 ([facility]).   + weight gain, fatigue, mood changes, hair loss.  No cold intolerance, skin changes.   Since partial thyroidectomy, noticing mood swings, feeling ill mood all over the place, increased irritability. Predominantly anxiety.  Passive SI, no active plan.  She does have a counselor she is able to discuss Thom Z through work).  No fmhx mania/bipolar.   Notes h/o ADD, was previously on stimulant as child, symptoms did improve as an adult. She feels she may benefit from restarting stimulant. Asked for records of psychological evaluation completed 2022.   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   S/p total hysterectomy with ovaries removal 2022 (abnormal cervical cells, abnormal ovarian cysts, h/o PCOS), rec against hormone replacement due to increased risk of cancer. H/o cervical cancer at age 53yo s/p LEEP.      Relevant past medical, surgical, family and social history reviewed and updated as indicated. Interim medical history since our last visit reviewed. Allergies and medications reviewed and updated. Outpatient Medications  Prior to Visit  Medication Sig Dispense Refill   ALEVE 220 MG tablet Take 220-440 mg by mouth 2 (two) times daily as needed (for pain).     cyclobenzaprine  (FLEXERIL ) 5 MG tablet Take 5 mg by mouth daily as needed for muscle spasms.     famotidine  (PEPCID ) 20 MG tablet TAKE 1 TABLET BY MOUTH TWICE A DAY 180 tablet 1   gabapentin  (NEURONTIN ) 300 MG capsule Take 1 capsule (300 mg total) by mouth at bedtime. (Patient taking differently: Take 300 mg by mouth as needed.) 30 capsule 3   omeprazole  (PRILOSEC) 40 MG capsule Take 1 capsule (40 mg total) by mouth daily. 30 capsule 3   trimethoprim  (TRIMPEX ) 100 MG tablet Take 1 tablet (100 mg total) by mouth daily. 90 tablet 0   No facility-administered medications prior to visit.     Per HPI unless specifically indicated in ROS section below Review of Systems  Objective:  BP 110/86 (Cuff Size: Normal)   Pulse 96   Temp 97.8 F (36.6 C) (Oral)   Ht 5' 1 (1.549 m)   Wt 187 lb 6.4 oz (85 kg)   LMP 09/29/2020 Comment: PCOS  SpO2 98%   BMI 35.41 kg/m   Wt Readings from Last 3 Encounters:  01/20/24 187 lb 6.4 oz (85 kg)  11/28/23 184 lb (83.5 kg)  11/23/23 186 lb (84.4 kg)      Physical Exam Vitals and nursing note reviewed.  Constitutional:  Appearance: Normal appearance. She is not ill-appearing.  HENT:     Head: Normocephalic and atraumatic.     Mouth/Throat:     Mouth: Mucous membranes are moist.     Pharynx: Oropharynx is clear. No oropharyngeal exudate or posterior oropharyngeal erythema.  Eyes:     Extraocular Movements: Extraocular movements intact.     Pupils: Pupils are equal, round, and reactive to light.  Cardiovascular:     Rate and Rhythm: Normal rate and regular rhythm.     Pulses: Normal pulses.     Heart sounds: Normal heart sounds. No murmur heard. Pulmonary:     Effort: Pulmonary effort is normal. No respiratory distress.     Breath sounds: Normal breath sounds. No wheezing, rhonchi or rales.   Musculoskeletal:     Right lower leg: No edema.     Left lower leg: No edema.  Skin:    General: Skin is warm and dry.     Findings: No rash.  Neurological:     Mental Status: She is alert.  Psychiatric:        Mood and Affect: Mood normal.        Behavior: Behavior normal.       Results for orders placed or performed in visit on 11/21/23  CBC with Differential/Platelet   Collection Time: 11/21/23  3:17 PM  Result Value Ref Range   WBC 9.6 4.0 - 10.5 K/uL   RBC 5.20 (H) 3.87 - 5.11 Mil/uL   Hemoglobin 15.0 12.0 - 15.0 g/dL   HCT 54.5 63.9 - 53.9 %   MCV 87.3 78.0 - 100.0 fl   MCHC 33.1 30.0 - 36.0 g/dL   RDW 86.0 88.4 - 84.4 %   Platelets 299.0 150.0 - 400.0 K/uL   Neutrophils Relative % 48.9 43.0 - 77.0 %   Lymphocytes Relative 40.2 12.0 - 46.0 %   Monocytes Relative 7.1 3.0 - 12.0 %   Eosinophils Relative 3.3 0.0 - 5.0 %   Basophils Relative 0.5 0.0 - 3.0 %   Neutro Abs 4.7 1.4 - 7.7 K/uL   Lymphs Abs 3.9 0.7 - 4.0 K/uL   Monocytes Absolute 0.7 0.1 - 1.0 K/uL   Eosinophils Absolute 0.3 0.0 - 0.7 K/uL   Basophils Absolute 0.0 0.0 - 0.1 K/uL  Comprehensive metabolic panel with GFR   Collection Time: 11/21/23  3:17 PM  Result Value Ref Range   Sodium 141 135 - 145 mEq/L   Potassium 4.3 3.5 - 5.1 mEq/L   Chloride 103 96 - 112 mEq/L   CO2 27 19 - 32 mEq/L   Glucose, Bld 91 70 - 99 mg/dL   BUN 12 6 - 23 mg/dL   Creatinine, Ser 9.19 0.40 - 1.20 mg/dL   Total Bilirubin 1.0 0.2 - 1.2 mg/dL   Alkaline Phosphatase 77 39 - 117 U/L   AST 14 0 - 37 U/L   ALT 14 0 - 35 U/L   Total Protein 7.3 6.0 - 8.3 g/dL   Albumin 4.2 3.5 - 5.2 g/dL   GFR 09.80 >39.99 mL/min   Calcium  8.8 8.4 - 10.5 mg/dL  Vitamin A87   Collection Time: 11/21/23  3:17 PM  Result Value Ref Range   Vitamin B-12 264 211 - 911 pg/mL  ANA   Collection Time: 11/21/23  3:17 PM  Result Value Ref Range   Anti Nuclear Antibody (ANA) POSITIVE (A) NEGATIVE  Anti-nuclear ab-titer (ANA titer)   Collection  Time: 11/21/23  3:17 PM  Result  Value Ref Range   ANA Titer 1 1:80 (H) titer   ANA Pattern 1 Nuclear, Homogeneous (A)   POCT urinalysis dipstick   Collection Time: 11/21/23  4:00 PM  Result Value Ref Range   Color, UA amber    Clarity, UA cloudy    Glucose, UA Negative Negative   Bilirubin, UA negative    Ketones, UA negative    Spec Grav, UA >=1.030 (A) 1.010 - 1.025   Blood, UA 3+    pH, UA 5.5 5.0 - 8.0   Protein, UA Positive (A) Negative   Urobilinogen, UA 0.2 0.2 or 1.0 E.U./dL   Nitrite, UA negative    Leukocytes, UA Negative Negative   Appearance     Odor     Lab Results  Component Value Date   VD25OH 11.79 (L) 05/17/2022      01/20/2024    7:43 AM 11/21/2023    2:35 PM 09/09/2023   12:28 PM 12/29/2022   12:12 PM 05/24/2022   12:05 PM  Depression screen PHQ 2/9  Decreased Interest 0 0 0 0 0  Down, Depressed, Hopeless 0 0 0 0 0  PHQ - 2 Score 0 0 0 0 0  Altered sleeping 0 0 0 0 0  Tired, decreased energy 0 0 0 0 0  Change in appetite 0 0 0 0 0  Feeling bad or failure about yourself  0 0 0 0 0  Trouble concentrating 0 0 0 0 0  Moving slowly or fidgety/restless 0 0 0 0 0  Suicidal thoughts 0 0 0 0 0  PHQ-9 Score 0 0  0  0  0   Difficult doing work/chores Not difficult at all Not difficult at all Not difficult at all Not difficult at all Not difficult at all     Data saved with a previous flowsheet row definition       01/20/2024    7:43 AM 11/21/2023    2:35 PM 09/09/2023   12:28 PM 12/29/2022   12:12 PM  GAD 7 : Generalized Anxiety Score  Nervous, Anxious, on Edge 0 0 0 0  Control/stop worrying 0 0 0 0  Worry too much - different things 0 0 0 0  Trouble relaxing 0 0 0 0  Restless 0 0 0 0  Easily annoyed or irritable 0 0 0 0  Afraid - awful might happen 0 0 0 0  Total GAD 7 Score 0 0 0 0  Anxiety Difficulty Not difficult at all Not difficult at all Not difficult at all Not difficult at all    MDQ7+1:  Has there ever been a period of time when you were  not your usual self and:  You felt so good or hyper that other people thought you were not your normal self or you got into trouble? no You were so irritable that you shouted at people or started fights or arguments? yes You felt much more self-confident than usual? no You got much less sleep than usual and found you didn't really miss it? no You were much more talkative or spoke much fasting than usual? yes Thoughts raced through your head or you couldn't slow your mind down? yes You were so easily distracted by things around you that you had trouble concentrating or staying on track? yes You had much more energy than usual? yes You were much more active or did many more things than usual? yes You were much more social or outgoing than usual, for  example you telephoned friends in the middle of the night? no You were much more interested in sex than usual? no You did things that were unusual for you or that other ppl might have thought excessive, foolish, risky? no Spending money got you or your family into trouble? no  If YES to more than one, have several of these ever happened during the same period? no  How much of a problem did any of these cause you - unable to work, having family, money, legal troubles, getting into arguments or fights? No prob  Minor prob Moderate prob   Serious prob  Have any blood relative had manic-depressive illness or bipolar disorder? no Has a health professional ever told you that you have manic-depression illness or bipolar disorder? no   Assessment & Plan:   Problem List Items Addressed This Visit     ADD (attention deficit disorder)   Carries this diagnosis as a child.  She underwent formal ADHD testing in 2022 through Dr Lauraine Bolder in [place] but I don't have records available - asked her to bring this in, and I will request copy of report to review. Consider Wellbutrin as per above.       Vitamin D  deficiency   Update levels off replacement       Relevant Orders   VITAMIN D  25 Hydroxy (Vit-D Deficiency, Fractures)   Vitamin B12 deficiency   Update vit B12 levels then repeat b12 shot. Last B12 shot was late 10/2023.       Relevant Orders   Vitamin B12   Intrinsic Factor Antibodies   History of lobectomy of thyroid    Notes worsened mood since she underwent left lobe thyroidectomy.  Update TSH today.       Relevant Orders   TSH   Mood disorder - Primary   Worsening mood noted by patient and family since thyroid  surgery.  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  Will update TSH. Pending results, consider wellbutrin given previous mood medications have caused weight gain.  Low PHQ9/GAD7 scores, MDQ 6 positive questions - but overall negative screen.        Meds ordered this encounter  Medications   cyanocobalamin  (VITAMIN B12) injection 1,000 mcg    Orders Placed This Encounter  Procedures   TSH   Vitamin B12   Intrinsic Factor Antibodies   VITAMIN D  25 Hydroxy (Vit-D Deficiency, Fractures)    Patient Instructions  Labs today then B12 shot.  Bring me records of ADD testing, and we will requests records from Dr Lauraine Bolder in [place].  Continue seeing counselor, let me know if you need new referral.   Follow up plan: Return if symptoms worsen or fail to improve.  Anton Blas, MD   "

## 2024-01-20 NOTE — Assessment & Plan Note (Addendum)
 Worsening mood noted by patient and family since thyroid  surgery.  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  Will update TSH. Pending results, consider wellbutrin given previous mood medications have caused weight gain.  Low PHQ9/GAD7 scores, MDQ 6 positive questions - but overall negative screen.

## 2024-01-22 LAB — INTRINSIC FACTOR BLOCKING ANTIBODY: Intrinsic Factor: NEGATIVE

## 2024-01-28 ENCOUNTER — Ambulatory Visit: Payer: Self-pay | Admitting: Family Medicine

## 2024-01-28 MED ORDER — VITAMIN D (ERGOCALCIFEROL) 1.25 MG (50000 UNIT) PO CAPS: 50000.0000 [IU] | ORAL_CAPSULE | ORAL | 1 refills | Status: AC

## 2024-01-28 MED ORDER — VITAMIN B-12 1000 MCG PO TABS: 1000.0000 ug | ORAL_TABLET | Freq: Every day | ORAL | Status: AC

## 2024-02-01 MED ORDER — BUPROPION HCL ER (SR) 100 MG PO TB12: 100.0000 mg | ORAL_TABLET | Freq: Every day | ORAL | 3 refills | Status: AC

## 2024-02-16 ENCOUNTER — Other Ambulatory Visit: Payer: Self-pay | Admitting: Family Medicine

## 2024-02-17 ENCOUNTER — Encounter: Payer: Self-pay | Admitting: Family Medicine

## 2024-02-17 ENCOUNTER — Ambulatory Visit

## 2024-02-17 ENCOUNTER — Ambulatory Visit: Admitting: Podiatry

## 2024-02-17 ENCOUNTER — Encounter: Payer: Self-pay | Admitting: Podiatry

## 2024-02-17 DIAGNOSIS — M779 Enthesopathy, unspecified: Secondary | ICD-10-CM

## 2024-02-17 DIAGNOSIS — M722 Plantar fascial fibromatosis: Secondary | ICD-10-CM | POA: Diagnosis not present

## 2024-02-17 DIAGNOSIS — M21621 Bunionette of right foot: Secondary | ICD-10-CM

## 2024-02-17 NOTE — Telephone Encounter (Signed)
 Copied from CRM #[id number]. Topic: General - Other >> Feb 17, 2024  8:20 AM [name] wrote: Reason for CRM: Patient calling to confirm office received FMLA paperwork emailed to the office at [email or web address], states she's under a timeline to return paperwork and wants to know if it can be expedited. Agent informed patient of normal turnaround time and patient wants them done sooner by February 1st.  Karthika [phone]

## 2024-02-17 NOTE — Patient Instructions (Signed)

## 2024-02-17 NOTE — Telephone Encounter (Signed)
 FMLA for Patient forms received for completion for patient. Patient has been informed that process may take up to 5 business days.  Employer Name [employer] Reason for being out: Anxiety and depression Any inpatient care: N/A Any Planned appointment? TBD Patient is requesting start date of 1.21.26 Patient is requesting end date of TBD  Or patient is requesting duration of up to 3x a week 2x a month.   Reached out to patient via MyChart that forms are being reviewed by provider and I will reach out when complete.  Patient would like to pick up copy in our office when form is completed.  Fax number form should be sent to is [phone]  Forms placed in providers box for review

## 2024-02-18 DIAGNOSIS — Z0279 Encounter for issue of other medical certificate: Secondary | ICD-10-CM

## 2024-02-21 ENCOUNTER — Telehealth: Payer: Self-pay | Admitting: Podiatry

## 2024-02-21 DIAGNOSIS — Z0279 Encounter for issue of other medical certificate: Secondary | ICD-10-CM

## 2024-02-21 NOTE — Progress Notes (Signed)
 Subjective:   Patient ID: Kayla Sexton, female   DOB: 44 y.o.   MRN: [id number]   HPI Patient presents stating she has had severe chronic heel pain of several years duration that she has just been dealing with.  She is starting to walk a lot depends due to increase in intensity of pain and is getting pain on the outside of the right foot and feels like it popped and she is not sure if something may have torn but broke   ROS      Objective:  Physical Exam  Neurovascular status was found to be intact quite a bit of inflammation still in the plantar heel right with fluid buildup and discomfort in the outside of the right foot consistent with tendinitis condition with failure to respond to numerous conservative treatments including previous injections and inserts anti-inflammatory shoe gear modification     Assessment:  Chronic plantar fasciitis right that has not gotten better over several years with change in gait creating stress on the lateral foot     Plan:  H&P all conditions reviewed and discussed.  We did discuss conservative treatment he stated he did not respond and the pain is getting worse and she would rather have it corrected.  I have recommended plantar fascial release right and she also has some distal fascial pain and I would assume that should alleviate that by taking the pressure off the tendon and the lateral pain we may have to do an injection but if it still so we will do it at time of surgery.  I did check the tendon I did not see indications of a tear it appears to function well.  I went ahead today and I did allow her to read and signed consent form for release of the fascia and she wants this done understanding total recovery can take around 6 months and will probably be around 8 weeks before she will be back into normal shoe gear.  Went over all possible complications dispensed air fracture walker properly fitted to her lower leg but I want her to wear now given the  inflammation she is experiencing  X-rays indicate spur no indication stress fracture or arthritis

## 2024-02-21 NOTE — Telephone Encounter (Signed)
 Filled out and returned to [facility].

## 2024-02-21 NOTE — Telephone Encounter (Signed)
 pt cld and I cld bk and lft mess on her vmail. she can pay in [place] and bring forms, or HR can fax to me. I left my fax and ph to call bk.

## 2024-02-21 NOTE — Telephone Encounter (Signed)
 Completed forms received and faxed to [facility] at [phone]  Copy sent to scan  MyChart message left for patient that copy was ready to pick up at the front desk at her convenience.

## 2024-02-21 NOTE — Telephone Encounter (Signed)
 Patient called to schedule surgery- Patient is scheduled for 03/06/2024. Patient not on any GLP1 or blood thinners and pharmacy is correct in chart.

## 2024-02-23 ENCOUNTER — Telehealth: Payer: Self-pay | Admitting: Podiatry

## 2024-02-23 NOTE — Telephone Encounter (Signed)
 cld pt bk from mess lft. 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 DOS 03/06/24 and approx RTW 05/01/24.She emailed me her forms and I will fax [place] and email her -email addr on file.

## 2024-02-23 NOTE — Telephone Encounter (Signed)
 DOS- 03/06/2024  5TH METATARSAL OSTEOTOMY RT- 71691 ENDOSCOPIC PLANTAR FASCIOTOMY RT- [id number]  [facility] EFFECTIVE DATE- 12/26/2023  DEDUCTIBLE- $500 REMAINING- $500 OOP- $2500 REMAINING- $2482.76 COINSURANCE- 20%  PER FAX RECEIVED FROM [facility] AUTH FOR CPT CODES 71691 AND [phone] HAVE BEEN APPROVED FROM 03/06/2024-06/03/2024. AUTH# [id number]

## 2024-02-28 ENCOUNTER — Telehealth: Payer: Self-pay | Admitting: Podiatry

## 2024-02-28 NOTE — Telephone Encounter (Signed)
 Faxed [facility] 800 [phone] and emailed pt form/notes DOS 03/06/24  RTW approx 05/01/24

## 2024-02-28 NOTE — Telephone Encounter (Signed)
 cld the pt bk to adv her forms were sent and she said thanks she got them. Wanted mess sent about getting a taller boot, she said the short one is not helping her. I sent mess to [place] to fwd mess

## 2024-03-12 ENCOUNTER — Encounter: Admitting: Podiatry

## 2024-03-26 ENCOUNTER — Encounter: Admitting: Podiatry
